# Patient Record
Sex: Male | Born: 1955 | Hispanic: No | Marital: Married | Smoking: Never smoker
Health system: Southern US, Community
[De-identification: ages and names within clinical notes are randomized; demographics above are authoritative.]

## PROBLEM LIST (undated history)

## (undated) DIAGNOSIS — G4730 Sleep apnea, unspecified: Secondary | ICD-10-CM

## (undated) DIAGNOSIS — Z950 Presence of cardiac pacemaker: Secondary | ICD-10-CM

## (undated) DIAGNOSIS — N184 Chronic kidney disease, stage 4 (severe): Secondary | ICD-10-CM

## (undated) DIAGNOSIS — M109 Gout, unspecified: Secondary | ICD-10-CM

## (undated) DIAGNOSIS — N179 Acute kidney failure, unspecified: Secondary | ICD-10-CM

## (undated) DIAGNOSIS — H409 Unspecified glaucoma: Secondary | ICD-10-CM

## (undated) DIAGNOSIS — I10 Essential (primary) hypertension: Secondary | ICD-10-CM

## (undated) DIAGNOSIS — E669 Obesity, unspecified: Secondary | ICD-10-CM

## (undated) DIAGNOSIS — G5793 Unspecified mononeuropathy of bilateral lower limbs: Secondary | ICD-10-CM

## (undated) DIAGNOSIS — N390 Urinary tract infection, site not specified: Secondary | ICD-10-CM

## (undated) DIAGNOSIS — I8390 Asymptomatic varicose veins of unspecified lower extremity: Secondary | ICD-10-CM

## (undated) DIAGNOSIS — I509 Heart failure, unspecified: Secondary | ICD-10-CM

## (undated) DIAGNOSIS — I350 Nonrheumatic aortic (valve) stenosis: Secondary | ICD-10-CM

## (undated) HISTORY — DX: Chronic kidney disease, stage 4 (severe): N18.4

## (undated) HISTORY — PX: KNEE SURGERY: SHX244

## (undated) HISTORY — PX: GASTRIC BYPASS: SHX52

## (undated) HISTORY — PX: OTHER SURGICAL HISTORY: SHX169

## (undated) HISTORY — DX: Presence of cardiac pacemaker: Z95.0

## (undated) HISTORY — PX: CHOLECYSTECTOMY, LAPAROSCOPIC: SHX56

## (undated) HISTORY — DX: Asymptomatic varicose veins of unspecified lower extremity: I83.90

## (undated) HISTORY — PX: EYE SURGERY: SHX253

## (undated) HISTORY — DX: Nonrheumatic aortic (valve) stenosis: I35.0

---

## 2008-03-12 ENCOUNTER — Emergency Department (HOSPITAL_COMMUNITY): Admission: EM | Admit: 2008-03-12 | Discharge: 2008-03-12 | Payer: Self-pay | Admitting: Emergency Medicine

## 2010-10-05 ENCOUNTER — Emergency Department (HOSPITAL_COMMUNITY)
Admission: EM | Admit: 2010-10-05 | Discharge: 2010-10-05 | Disposition: A | Discharge status: Home / Self Care | Payer: Medicaid - Out of State | Attending: Emergency Medicine | Admitting: Emergency Medicine

## 2010-10-05 DIAGNOSIS — Z862 Personal history of diseases of the blood and blood-forming organs and certain disorders involving the immune mechanism: Secondary | ICD-10-CM | POA: Insufficient documentation

## 2010-10-05 DIAGNOSIS — H938X9 Other specified disorders of ear, unspecified ear: Secondary | ICD-10-CM | POA: Insufficient documentation

## 2010-10-05 DIAGNOSIS — K219 Gastro-esophageal reflux disease without esophagitis: Secondary | ICD-10-CM | POA: Insufficient documentation

## 2010-10-05 DIAGNOSIS — I10 Essential (primary) hypertension: Secondary | ICD-10-CM | POA: Insufficient documentation

## 2010-10-05 DIAGNOSIS — I509 Heart failure, unspecified: Secondary | ICD-10-CM | POA: Insufficient documentation

## 2010-10-05 DIAGNOSIS — E119 Type 2 diabetes mellitus without complications: Secondary | ICD-10-CM | POA: Insufficient documentation

## 2010-10-05 DIAGNOSIS — R599 Enlarged lymph nodes, unspecified: Secondary | ICD-10-CM | POA: Insufficient documentation

## 2010-10-05 DIAGNOSIS — Z8639 Personal history of other endocrine, nutritional and metabolic disease: Secondary | ICD-10-CM | POA: Insufficient documentation

## 2010-10-05 DIAGNOSIS — Z794 Long term (current) use of insulin: Secondary | ICD-10-CM | POA: Insufficient documentation

## 2010-10-05 DIAGNOSIS — H60399 Other infective otitis externa, unspecified ear: Secondary | ICD-10-CM | POA: Insufficient documentation

## 2010-10-05 DIAGNOSIS — Z79899 Other long term (current) drug therapy: Secondary | ICD-10-CM | POA: Insufficient documentation

## 2010-10-05 DIAGNOSIS — Z7982 Long term (current) use of aspirin: Secondary | ICD-10-CM | POA: Insufficient documentation

## 2010-10-05 DIAGNOSIS — H9209 Otalgia, unspecified ear: Secondary | ICD-10-CM | POA: Insufficient documentation

## 2011-03-17 ENCOUNTER — Encounter: Payer: Self-pay | Admitting: Family Medicine

## 2011-03-17 DIAGNOSIS — H409 Unspecified glaucoma: Secondary | ICD-10-CM | POA: Insufficient documentation

## 2011-03-17 DIAGNOSIS — G629 Polyneuropathy, unspecified: Secondary | ICD-10-CM

## 2011-03-17 DIAGNOSIS — I10 Essential (primary) hypertension: Secondary | ICD-10-CM

## 2011-03-17 DIAGNOSIS — IMO0001 Reserved for inherently not codable concepts without codable children: Secondary | ICD-10-CM | POA: Insufficient documentation

## 2011-03-17 DIAGNOSIS — N189 Chronic kidney disease, unspecified: Secondary | ICD-10-CM

## 2011-03-17 DIAGNOSIS — E785 Hyperlipidemia, unspecified: Secondary | ICD-10-CM

## 2011-03-17 DIAGNOSIS — R609 Edema, unspecified: Secondary | ICD-10-CM | POA: Insufficient documentation

## 2011-03-17 DIAGNOSIS — M109 Gout, unspecified: Secondary | ICD-10-CM | POA: Insufficient documentation

## 2011-03-17 DIAGNOSIS — R600 Localized edema: Secondary | ICD-10-CM

## 2011-03-17 DIAGNOSIS — G4733 Obstructive sleep apnea (adult) (pediatric): Secondary | ICD-10-CM

## 2011-03-17 DIAGNOSIS — I5032 Chronic diastolic (congestive) heart failure: Secondary | ICD-10-CM

## 2011-04-10 ENCOUNTER — Other Ambulatory Visit: Payer: Self-pay | Admitting: Cardiology

## 2011-04-10 DIAGNOSIS — R0989 Other specified symptoms and signs involving the circulatory and respiratory systems: Secondary | ICD-10-CM

## 2011-04-14 ENCOUNTER — Encounter (INDEPENDENT_AMBULATORY_CARE_PROVIDER_SITE_OTHER): Payer: Medicaid Other | Admitting: *Deleted

## 2011-04-14 ENCOUNTER — Encounter: Payer: Medicaid - Out of State | Admitting: Cardiology

## 2011-04-14 DIAGNOSIS — R0989 Other specified symptoms and signs involving the circulatory and respiratory systems: Secondary | ICD-10-CM

## 2011-06-08 ENCOUNTER — Encounter: Payer: Self-pay | Admitting: Emergency Medicine

## 2011-06-08 ENCOUNTER — Emergency Department (HOSPITAL_COMMUNITY)
Admission: EM | Admit: 2011-06-08 | Discharge: 2011-06-08 | Disposition: A | Discharge status: Home / Self Care | Payer: Medicaid Other | Attending: Emergency Medicine | Admitting: Emergency Medicine

## 2011-06-08 DIAGNOSIS — K5289 Other specified noninfective gastroenteritis and colitis: Secondary | ICD-10-CM | POA: Insufficient documentation

## 2011-06-08 DIAGNOSIS — R5381 Other malaise: Secondary | ICD-10-CM | POA: Insufficient documentation

## 2011-06-08 DIAGNOSIS — R197 Diarrhea, unspecified: Secondary | ICD-10-CM | POA: Insufficient documentation

## 2011-06-08 DIAGNOSIS — R42 Dizziness and giddiness: Secondary | ICD-10-CM | POA: Insufficient documentation

## 2011-06-08 DIAGNOSIS — G4730 Sleep apnea, unspecified: Secondary | ICD-10-CM | POA: Insufficient documentation

## 2011-06-08 DIAGNOSIS — E669 Obesity, unspecified: Secondary | ICD-10-CM | POA: Insufficient documentation

## 2011-06-08 DIAGNOSIS — I10 Essential (primary) hypertension: Secondary | ICD-10-CM | POA: Insufficient documentation

## 2011-06-08 DIAGNOSIS — Z794 Long term (current) use of insulin: Secondary | ICD-10-CM | POA: Insufficient documentation

## 2011-06-08 DIAGNOSIS — E119 Type 2 diabetes mellitus without complications: Secondary | ICD-10-CM | POA: Insufficient documentation

## 2011-06-08 DIAGNOSIS — R112 Nausea with vomiting, unspecified: Secondary | ICD-10-CM | POA: Insufficient documentation

## 2011-06-08 DIAGNOSIS — K529 Noninfective gastroenteritis and colitis, unspecified: Secondary | ICD-10-CM

## 2011-06-08 HISTORY — DX: Obesity, unspecified: E66.9

## 2011-06-08 HISTORY — DX: Essential (primary) hypertension: I10

## 2011-06-08 HISTORY — DX: Sleep apnea, unspecified: G47.30

## 2011-06-08 LAB — COMPREHENSIVE METABOLIC PANEL
CO2: 27 mEq/L (ref 19–32)
Calcium: 9.8 mg/dL (ref 8.4–10.5)
Chloride: 99 mEq/L (ref 96–112)
Creatinine, Ser: 2.03 mg/dL — ABNORMAL HIGH (ref 0.50–1.35)
Total Bilirubin: 0.3 mg/dL (ref 0.3–1.2)
Total Protein: 7.7 g/dL (ref 6.0–8.3)

## 2011-06-08 LAB — CBC
HCT: 32.8 % — ABNORMAL LOW (ref 39.0–52.0)
Hemoglobin: 10.9 g/dL — ABNORMAL LOW (ref 13.0–17.0)
MCHC: 33.2 g/dL (ref 30.0–36.0)
Platelets: 187 10*3/uL (ref 150–400)
RBC: 3.99 MIL/uL — ABNORMAL LOW (ref 4.22–5.81)
RDW: 14.7 % (ref 11.5–15.5)
WBC: 13.5 10*3/uL — ABNORMAL HIGH (ref 4.0–10.5)

## 2011-06-08 LAB — DIFFERENTIAL
Basophils Absolute: 0.1 10*3/uL (ref 0.0–0.1)
Basophils Relative: 0 % (ref 0–1)
Eosinophils Absolute: 0.3 10*3/uL (ref 0.0–0.7)
Eosinophils Relative: 2 % (ref 0–5)
Lymphocytes Relative: 16 % (ref 12–46)
Monocytes Relative: 5 % (ref 3–12)
Neutrophils Relative %: 76 % (ref 43–77)

## 2011-06-08 MED ORDER — LOPERAMIDE HCL 2 MG PO CAPS
4.0000 mg | ORAL_CAPSULE | Freq: Once | ORAL | Status: AC
  Administered 2011-06-08: 4 mg via ORAL
  Filled 2011-06-08: qty 2
  Filled 2011-06-08: qty 1

## 2011-06-08 MED ORDER — SODIUM CHLORIDE 0.9 % IV BOLUS (SEPSIS)
1000.0000 mL | Freq: Once | INTRAVENOUS | Status: AC
  Administered 2011-06-08: 1000 mL via INTRAVENOUS

## 2011-06-08 MED ORDER — ONDANSETRON HCL 4 MG/2ML IJ SOLN
4.0000 mg | Freq: Once | INTRAMUSCULAR | Status: AC
  Administered 2011-06-08: 4 mg via INTRAVENOUS
  Filled 2011-06-08: qty 2

## 2011-06-08 MED ORDER — ONDANSETRON HCL 4 MG PO TABS: 4.0000 mg | ORAL_TABLET | Freq: Four times a day (QID) | ORAL | Status: AC

## 2011-06-08 NOTE — ED Provider Notes (Signed)
Medical screening examination/treatment/procedure(s) were performed by non-physician practitioner and as supervising physician I was immediately available for consultation/collaboration. Devoria Albe, MD, FACEP   Ward Givens, MD 06/08/11 [phone]

## 2011-06-08 NOTE — ED Notes (Signed)
PT. REPORTS PERSISTENT DIARRHEA WITH VOMITTING AND DIZZINESS ONSET YESTERDAY , DENIES FEVER OR CHILLS , NO PAIN .

## 2011-06-08 NOTE — ED Notes (Signed)
Pt reports he feels better and is ready to go home. MD made aware.

## 2011-06-08 NOTE — ED Provider Notes (Signed)
History     CSN: [id number]  Arrival date & time 06/08/11  0335   First MD Initiated Contact with Patient 06/08/11 0400      Chief Complaint  Patient presents with  . Diarrhea    (Consider location/radiation/quality/duration/timing/severity/associated sxs/prior treatment) HPI Comments: Patient here with diarrhea since yesterday - states awoke in the morning with watery diarrhea - states vomiting stated yesterday afternoon - reports multiple episodes of NBNB vomiting and watery non-foul smelling diarrhea - reports daughter in home with similar symptoms.  Denies fever, chills, states has been feeling dizzy and fatigued with the symptoms - has a history of DM but states no history of DKA - denies abdominal pain or blood in the bowel movement.  No recent trips.  Patient is a 56 y.o. male presenting with diarrhea. The history is provided by the patient. No language interpreter was used.  Diarrhea The primary symptoms include fatigue, nausea, vomiting and diarrhea. Primary symptoms do not include fever, weight loss, abdominal pain, melena, hematemesis, jaundice, hematochezia, dysuria, myalgias, arthralgias or rash. The illness began yesterday. The onset was sudden. The problem has not changed since onset. The illness does not include chills, anorexia, dysphagia, odynophagia, bloating, constipation, tenesmus, back pain or itching.    Past Medical History  Diagnosis Date  . Diabetes mellitus   . Hypertension   . Obesity   . Sleep apnea     History reviewed. No pertinent past surgical history.  No family history on file.  History  Substance Use Topics  . Smoking status: Never Smoker   . Smokeless tobacco: Not on file  . Alcohol Use: No      Review of Systems  Constitutional: Positive for appetite change and fatigue. Negative for fever, chills and weight loss.  Gastrointestinal: Positive for nausea, vomiting and diarrhea. Negative for dysphagia, abdominal pain, constipation, blood  in stool, melena, hematochezia, bloating, anorexia, hematemesis and jaundice.  Genitourinary: Negative for dysuria and flank pain.  Musculoskeletal: Negative for myalgias, back pain and arthralgias.  Skin: Negative for itching and rash.  Neurological: Positive for dizziness.  All other systems reviewed and are negative.    Allergies  Penicillins  Home Medications   Current Outpatient Rx  Name Route Sig Dispense Refill  . ACETAMINOPHEN 500 MG PO TABS Oral Take 1,000 mg by mouth every 6 (six) hours as needed. For pain     . ALLOPURINOL 100 MG PO TABS Oral Take 100 mg by mouth 2 (two) times daily.      . ASPIRIN 81 MG PO TABS Oral Take 81 mg by mouth daily.      Marland Kitchen BRINZOLAMIDE 1 % OP SUSP Both Eyes Place 1 drop into both eyes 2 (two) times daily.      Marland Kitchen CARVEDILOL 12.5 MG PO TABS Oral Take 12.5 mg by mouth 2 (two) times daily with a meal.      . DORZOLAMIDE HCL-TIMOLOL MAL 22.3-6.8 MG/ML OP SOLN Both Eyes Place 1 drop into both eyes 2 (two) times daily.      Marland Kitchen EXENATIDE 5 MCG/0.02ML [place] SOLN Subcutaneous Inject 5-10 mcg into the skin 2 (two) times daily with a meal. Takes in the morning and 10 mcg in the evening     . FUROSEMIDE 80 MG PO TABS Oral Take 80 mg by mouth 2 (two) times daily.      Marland Kitchen GABAPENTIN 400 MG PO CAPS Oral Take 400 mg by mouth 3 (three) times daily.      . INSULIN  ASPART 100 UNIT/ML [place] SOLN Subcutaneous Inject 45 Units into the skin 3 (three) times daily before meals.      . INSULIN GLARGINE 100 UNIT/ML  SOLN Subcutaneous Inject 45-55 Units into the skin 2 (two) times daily. 45 units QAM    55 units QPM    . LATANOPROST 0.005 % OP SOLN Left Eye Place 1 drop into the left eye at bedtime.     Marland Kitchen LISINOPRIL 10 MG PO TABS Oral Take 10 mg by mouth daily.      Marland Kitchen METFORMIN HCL 1000 MG PO TABS Oral Take 500-1,000 mg by mouth 2 (two) times daily with a meal. Takes 500 mg in the morning and 1000 mg at night    . OMEPRAZOLE 40 MG PO CPDR Oral Take 40 mg by mouth daily.      Marland Kitchen  PREDNISOLONE ACETATE 1 % OP SUSP Both Eyes Place 1 drop into both eyes at bedtime.      Marland Kitchen ROSUVASTATIN CALCIUM 20 MG PO TABS Oral Take 20 mg by mouth at bedtime.      Marland Kitchen VITAMIN B-12 1000 MCG PO TABS Oral Take 1,000 mcg by mouth daily.        BP 130/56  Pulse 82  Temp(Src) 98 F (36.7 C) (Oral)  Resp 18  SpO2 98%  Physical Exam  Nursing note and vitals reviewed. Constitutional: He is oriented to person, place, and time. He appears well-developed and well-nourished. No distress.       Morbidly obese  HENT:  Head: Normocephalic and atraumatic.  Right Ear: External ear normal.  Left Ear: External ear normal.  Nose: Nose normal.  Mouth/Throat: Oropharynx is clear and moist. No oropharyngeal exudate.  Eyes: Conjunctivae are normal. Pupils are equal, round, and reactive to light. No scleral icterus.  Neck: Normal range of motion. Neck supple. No JVD present.  Cardiovascular: Normal rate, regular rhythm and normal heart sounds.  Exam reveals no gallop and no friction rub.   No murmur heard. Pulmonary/Chest: Effort normal and breath sounds normal. No respiratory distress. He has no wheezes. He exhibits no tenderness.  Abdominal: Soft. Normal appearance. He exhibits no distension. Bowel sounds are increased. There is no tenderness. There is no CVA tenderness.  Musculoskeletal: Normal range of motion. He exhibits edema. He exhibits no tenderness.       1+ pitting edema  Lymphadenopathy:    He has no cervical adenopathy.  Neurological: He is alert and oriented to person, place, and time. No cranial nerve deficit.  Skin: Skin is warm and dry. No rash noted. No erythema. No pallor.  Psychiatric: He has a normal mood and affect. His behavior is normal. Judgment and thought content normal.    ED Course  Procedures (including critical care time)  Labs Reviewed  CBC - Abnormal; Notable for the following:    WBC 13.5 (*)    RBC 3.99 (*)    Hemoglobin 10.9 (*)    HCT 32.8 (*)    All other  components within normal limits  DIFFERENTIAL - Abnormal; Notable for the following:    Neutro Abs 10.3 (*)    All other components within normal limits  COMPREHENSIVE METABOLIC PANEL - Abnormal; Notable for the following:    Glucose, Bld 184 (*)    BUN 47 (*)    Creatinine, Ser 2.03 (*)    Albumin 3.3 (*)    GFR calc non Af Amer 35 (*)    GFR calc Af Amer 41 (*)  All other components within normal limits  LIPASE, BLOOD   No results found.   Gastroenteritis Chronic renal insufficiency hyperglycemia    MDM  Patient reports marked improvement since administration of the liter of fluids - will discharge home on imodium and zofran - patient noted with BUN of 47 and Cr 2.03 - I did not find any old values for this but when I talked with the patient he states that his "diabetes doctor" told him that his kidneys were not functioning good so I think this is his baseline.        Izola Price [place], [place] 06/08/11 [phone]

## 2011-06-08 NOTE — ED Notes (Signed)
Pt ambulated with a steady gait; VSS; no signs of distress; respirations even and unlabored; skin warm and dry. No questions at this time.

## 2011-08-05 ENCOUNTER — Emergency Department (HOSPITAL_COMMUNITY): Payer: No Typology Code available for payment source

## 2011-08-05 ENCOUNTER — Emergency Department (HOSPITAL_COMMUNITY)
Admission: EM | Admit: 2011-08-05 | Discharge: 2011-08-05 | Disposition: A | Discharge status: Home / Self Care | Payer: No Typology Code available for payment source | Attending: Emergency Medicine | Admitting: Emergency Medicine

## 2011-08-05 ENCOUNTER — Encounter (HOSPITAL_COMMUNITY): Payer: Self-pay | Admitting: Emergency Medicine

## 2011-08-05 DIAGNOSIS — S39012A Strain of muscle, fascia and tendon of lower back, initial encounter: Secondary | ICD-10-CM

## 2011-08-05 DIAGNOSIS — E669 Obesity, unspecified: Secondary | ICD-10-CM | POA: Insufficient documentation

## 2011-08-05 DIAGNOSIS — E119 Type 2 diabetes mellitus without complications: Secondary | ICD-10-CM | POA: Insufficient documentation

## 2011-08-05 DIAGNOSIS — M25569 Pain in unspecified knee: Secondary | ICD-10-CM | POA: Insufficient documentation

## 2011-08-05 DIAGNOSIS — S8000XA Contusion of unspecified knee, initial encounter: Secondary | ICD-10-CM

## 2011-08-05 DIAGNOSIS — Y92410 Unspecified street and highway as the place of occurrence of the external cause: Secondary | ICD-10-CM | POA: Insufficient documentation

## 2011-08-05 DIAGNOSIS — Z794 Long term (current) use of insulin: Secondary | ICD-10-CM | POA: Insufficient documentation

## 2011-08-05 DIAGNOSIS — M545 Low back pain, unspecified: Secondary | ICD-10-CM | POA: Insufficient documentation

## 2011-08-05 DIAGNOSIS — Z79899 Other long term (current) drug therapy: Secondary | ICD-10-CM | POA: Insufficient documentation

## 2011-08-05 DIAGNOSIS — I10 Essential (primary) hypertension: Secondary | ICD-10-CM | POA: Insufficient documentation

## 2011-08-05 MED ORDER — DIAZEPAM 5 MG PO TABS: 5.0000 mg | ORAL_TABLET | Freq: Two times a day (BID) | ORAL | Status: AC

## 2011-08-05 MED ORDER — HYDROCODONE-ACETAMINOPHEN 5-325 MG PO TABS: 1.0000 | ORAL_TABLET | ORAL | Status: AC | PRN

## 2011-08-05 MED ORDER — ACETAMINOPHEN 500 MG PO TABS
1000.0000 mg | ORAL_TABLET | Freq: Once | ORAL | Status: DC
  Filled 2011-08-05: qty 2

## 2011-08-05 MED ORDER — DOCUSATE SODIUM 100 MG PO CAPS: 100.0000 mg | ORAL_CAPSULE | Freq: Two times a day (BID) | ORAL | Status: AC

## 2011-08-05 MED ORDER — ACETAMINOPHEN 325 MG PO TABS
325.0000 mg | ORAL_TABLET | Freq: Once | ORAL | Status: AC
  Administered 2011-08-05: 325 mg via ORAL
  Filled 2011-08-05: qty 2

## 2011-08-05 MED ORDER — ACETAMINOPHEN 325 MG PO TABS
650.0000 mg | ORAL_TABLET | Freq: Once | ORAL | Status: AC
  Administered 2011-08-05: 650 mg via ORAL
  Filled 2011-08-05: qty 3
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
## 2011-10-21 ENCOUNTER — Encounter (HOSPITAL_BASED_OUTPATIENT_CLINIC_OR_DEPARTMENT_OTHER): Payer: Medicaid Other | Attending: General Surgery

## 2011-10-21 DIAGNOSIS — E109 Type 1 diabetes mellitus without complications: Secondary | ICD-10-CM | POA: Insufficient documentation

## 2011-10-21 DIAGNOSIS — Z794 Long term (current) use of insulin: Secondary | ICD-10-CM | POA: Insufficient documentation

## 2011-10-21 DIAGNOSIS — I739 Peripheral vascular disease, unspecified: Secondary | ICD-10-CM | POA: Insufficient documentation

## 2011-10-21 DIAGNOSIS — I872 Venous insufficiency (chronic) (peripheral): Secondary | ICD-10-CM | POA: Insufficient documentation

## 2011-10-21 DIAGNOSIS — G4730 Sleep apnea, unspecified: Secondary | ICD-10-CM | POA: Insufficient documentation

## 2011-10-21 DIAGNOSIS — Z79899 Other long term (current) drug therapy: Secondary | ICD-10-CM | POA: Insufficient documentation

## 2011-10-21 DIAGNOSIS — L97809 Non-pressure chronic ulcer of other part of unspecified lower leg with unspecified severity: Secondary | ICD-10-CM | POA: Insufficient documentation

## 2011-10-21 DIAGNOSIS — I10 Essential (primary) hypertension: Secondary | ICD-10-CM | POA: Insufficient documentation
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
## 2011-10-24 ENCOUNTER — Other Ambulatory Visit: Payer: Self-pay | Admitting: Family Medicine

## 2011-10-30 ENCOUNTER — Encounter (INDEPENDENT_AMBULATORY_CARE_PROVIDER_SITE_OTHER): Payer: Medicaid Other | Admitting: *Deleted

## 2011-10-30 ENCOUNTER — Ambulatory Visit (INDEPENDENT_AMBULATORY_CARE_PROVIDER_SITE_OTHER): Payer: Medicaid Other | Admitting: *Deleted

## 2011-10-30 DIAGNOSIS — L97509 Non-pressure chronic ulcer of other part of unspecified foot with unspecified severity: Secondary | ICD-10-CM

## 2011-11-03 ENCOUNTER — Emergency Department (HOSPITAL_COMMUNITY)
Admission: EM | Admit: 2011-11-03 | Discharge: 2011-11-03 | Disposition: A | Discharge status: Home / Self Care | Payer: Medicaid Other | Attending: Emergency Medicine | Admitting: Emergency Medicine

## 2011-11-03 ENCOUNTER — Encounter (HOSPITAL_COMMUNITY): Payer: Self-pay | Admitting: Emergency Medicine

## 2011-11-03 ENCOUNTER — Emergency Department (HOSPITAL_COMMUNITY): Payer: Medicaid Other

## 2011-11-03 DIAGNOSIS — G4730 Sleep apnea, unspecified: Secondary | ICD-10-CM | POA: Insufficient documentation

## 2011-11-03 DIAGNOSIS — E119 Type 2 diabetes mellitus without complications: Secondary | ICD-10-CM | POA: Insufficient documentation

## 2011-11-03 DIAGNOSIS — Z79899 Other long term (current) drug therapy: Secondary | ICD-10-CM | POA: Insufficient documentation

## 2011-11-03 DIAGNOSIS — M109 Gout, unspecified: Secondary | ICD-10-CM | POA: Insufficient documentation

## 2011-11-03 DIAGNOSIS — Z794 Long term (current) use of insulin: Secondary | ICD-10-CM | POA: Insufficient documentation

## 2011-11-03 DIAGNOSIS — I10 Essential (primary) hypertension: Secondary | ICD-10-CM | POA: Insufficient documentation

## 2011-11-03 HISTORY — DX: Gout, unspecified: M10.9

## 2011-11-03 MED ORDER — OXYCODONE-ACETAMINOPHEN 5-325 MG PO TABS
2.0000 | ORAL_TABLET | Freq: Once | ORAL | Status: AC
  Administered 2011-11-03: 2 via ORAL
  Filled 2011-11-03: qty 2

## 2011-11-03 MED ORDER — CELECOXIB 100 MG PO CAPS: 100.0000 mg | ORAL_CAPSULE | Freq: Two times a day (BID) | ORAL | Status: AC

## 2011-11-03 MED ORDER — OXYCODONE-ACETAMINOPHEN 5-325 MG PO TABS: 1.0000 | ORAL_TABLET | ORAL | Status: AC | PRN

## 2011-11-03 MED ORDER — COLCHICINE 0.6 MG PO TABS: ORAL_TABLET | ORAL | Status: DC

## 2011-11-03 MED ORDER — PREDNISONE 20 MG PO TABS: 40.0000 mg | ORAL_TABLET | Freq: Every day | ORAL | Status: AC

## 2011-11-03 MED ORDER — COLCHICINE 0.6 MG PO TABS
1.2000 mg | ORAL_TABLET | Freq: Once | ORAL | Status: AC
  Administered 2011-11-03: 1.2 mg via ORAL
  Filled 2011-11-03: qty 2

## 2011-11-03 NOTE — Discharge Instructions (Signed)
Gout:  You have been diagnosed with having an attack of acute gouty arthritis - this occurs when a joint has a build up of crystals that cause pain and swelling of the joint.  This can be significantly painful and if you develop severe or worsening pain or swelling or fevers you should return to the ER immediately for a recheck.  Generally this can be expected to get worse over 24-48 hours and then gradually improve over the week.  The pain will be much worse with touching or moving the effected joint.  You should treat this with keeping your joint elevated, and use medicines as listed below that I have prescribed, ice packs - (wrapped in a towel before touching the skin).  The medications used to treat chronic gout and to prevent further attacks cannot be given in the acute phase and must be directed by your family doctor.  Call your doctor today for a recheck in one week.  If you don't have a physician see the list of available physicians at the end of these instructions.  Please read the instructions below in their entirety to help better explain your disease process.    Gout  Gout is an inflammatory condition (arthritis) caused by a buildup of uric acid crystals in the joints. Uric acid is a chemical that is normally present in the blood. Under some circumstances, uric acid can form into crystals in your joints. This causes joint redness, soreness, and swelling (inflammation). Repeat attacks are common. Over time, uric acid crystals can form into masses (tophi) near a joint, causing disfigurement. Gout is treatable and often preventable.  CAUSES  The disease begins with elevated levels of uric acid in the blood. Uric acid is produced by your body when it breaks down a naturally found substance called purines. This also happens when you eat certain foods such as meats and fish. Causes of an elevated uric acid level include:  Being passed down from parent to child (heredity).  Diseases that cause  increased uric acid production (obesity, psoriasis, some cancers).  Excessive alcohol use.  Diet, especially diets rich in meat and seafood.  Medicines, including certain cancer-fighting drugs (chemotherapy), diuretics, and aspirin.  Chronic kidney disease. The kidneys are no longer able to remove uric acid well.  Problems with metabolism.  Conditions strongly associated with gout include:  Obesity.  High blood pressure.  High cholesterol.  Diabetes.  Not everyone with elevated uric acid levels gets gout. It is not understood why some people get gout and others do not. Surgery, joint injury, and eating too much of certain foods are some of the factors that can lead to gout.  SYMPTOMS  An attack of gout comes on quickly. It causes intense pain with redness, swelling, and warmth in a joint.  Fever can occur.  Often, only one joint is involved. Certain joints are more commonly involved:  Base of the big toe.  Knee.  Ankle.  Wrist.  Finger.  Without treatment, an attack usually goes away in a few days to weeks. Between attacks, you usually will not have symptoms, which is different from many other forms of arthritis.  DIAGNOSIS  Your caregiver will suspect gout based on your symptoms and exam. Removal of fluid from the joint (arthrocentesis) is done to check for uric acid crystals. Your caregiver will give you a medicine that numbs the area (local anesthetic) and use a needle to remove joint fluid for exam. Gout is confirmed when uric acid crystals   are seen in joint fluid, using a special microscope. Sometimes, blood, urine, and X-ray tests are also used.  TREATMENT  There are 2 phases to gout treatment: treating the sudden onset (acute) attack and preventing attacks (prophylaxis).  Treatment of an Acute Attack  Medicines are used. These include anti-inflammatory medicines or steroid medicines.  An injection of steroid medicine into the affected joint is sometimes necessary.  The painful  joint is rested. Movement can worsen the arthritis.  You may use warm or cold treatments on painful joints, depending which works best for you.  Discuss the use of coffee, vitamin C, or cherries with your caregiver. These may be helpful treatment options.  Treatment to Prevent Attacks  After the acute attack subsides, your caregiver may advise prophylactic medicine. These medicines either help your kidneys eliminate uric acid from your body or decrease your uric acid production. You may need to stay on these medicines for a very long time.  The early phase of treatment with prophylactic medicine can be associated with an increase in acute gout attacks. For this reason, during the first few months of treatment, your caregiver may also advise you to take medicines usually used for acute gout treatment. Be sure you understand your caregiver's directions.  You should also discuss dietary treatment with your caregiver. Certain foods such as meats and fish can increase uric acid levels. Other foods such as dairy can decrease levels. Your caregiver can give you a list of foods to avoid.  HOME CARE INSTRUCTIONS  Do not take aspirin to relieve pain. This raises uric acid levels.  Only take over-the-counter or prescription medicines for pain, discomfort, or fever as directed by your caregiver.  Rest the joint as much as possible. When in bed, keep sheets and blankets off painful areas.  Keep the affected joint raised (elevated).  Use crutches if the painful joint is in your leg.  Drink enough water and fluids to keep your urine clear or pale yellow. This helps your body get rid of uric acid. Do not drink alcoholic beverages. They slow the passage of uric acid.  Follow your caregiver's dietary instructions. Pay careful attention to the amount of protein you eat. Your daily diet should emphasize fruits, vegetables, whole grains, and fat-free or low-fat milk products.  Maintain a healthy body weight.  SEEK  MEDICAL CARE IF:  You have an oral temperature above 101 F (38.0 C).  You develop diarrhea, vomiting, or any side effects from medicines.  You do not feel better in 24 hours, or you are getting worse.  SEEK IMMEDIATE MEDICAL CARE IF:  Your joint becomes suddenly more tender and you have:  Chills.  An oral temperature above 112 F (38.0 C)  MAKE SURE YOU:  Understand these instructions.  Will watch your condition.  Will get help right away if you are not doing well or get worse.   Document Released: 05/16/2000 Document Revised: 05/08/2011 Document Reviewed: 08/27/2009  ExitCare Patient Information 2012 [employer].   The medicines that are used to treat gout are generally antiinflammatory medications and as with ALL medications they can causae side effects and should not be used for prolonged periods of time.  You may have been prescribed the following:  1.  Colchicine - take one tablen (0.6mg) by mouth every 1-2 hours until one of the following occurs:   A) The pain goes away  B) You have reached the maximum dose which is 3 tablets in 3 hours or 10 tablets   in 24 hours  C) You have side effects such as diarrhea, severe gas or nausea that prevent you from taking the medications   Colchicine should not be used for longer than 24 hours and if taken should not be used for 1 week after it was taken.  If you are a diabetic or have kidney problems you should exercise caution in taking this medications - consult with your pharmacist regarding use of these medications especially if you are taking other medications for  Acute or chronic conditions.  2.  Prednisone - for severe or recurrent gout this may help reduce the swelling and inflammation  3.  Celebrex - this is a type of NSAID similar to motrin / ibuprofen or aleve/naprosyn and should not be taken with these other medications that are NSAIDS as well.  4.  Percocet - This is an opiate medications and should be used with  caution.  Acetaminophen; Oxycodone tablets or PERCOCET  This is a pain reliever. It is used to treat mild to moderate pain.  This medicine may be used for other purposes; ask your health care provider or pharmacist if you have questions.  What should I tell my health care provider before I take this medicine?  They need to know if you have any of these conditions:  -brain tumor  -Crohn's disease, inflammatory bowel disease, or ulcerative colitis  -drink more than 3 alcohol containing drinks per day  -drug abuse or addiction  -head injury  -heart or circulation problems  -kidney disease or problems going to the bathroom  -liver disease  -lung disease, asthma, or breathing problems  -an unusual or allergic reaction to acetaminophen, oxycodone, other opioid analgesics, other medicines, foods, dyes, or preservatives  -pregnant or trying to get pregnant  -breast-feeding  How should I use this medicine?  Take this medicine by mouth with a full glass of water. Follow the directions on the prescription label. Take your medicine at regular intervals. Do not take your medicine more often than directed.  Talk to your pediatrician regarding the use of this medicine in children. Special care may be needed.  Patients over 65 years old may have a stronger reaction and need a smaller dose.  Overdosage: If you think you have taken too much of this medicine contact a poison control center or emergency room at once.  NOTE: This medicine is only for you. Do not share this medicine with others.   What may interact with this medicine?  -alcohol or medicines that contain alcohol  -antihistamines  -barbiturates like amobarbital, butalbital, butabarbital, methohexital, pentobarbital, phenobarbital, thiopental, and secobarbital  -benztropine  -drugs for bladder problems like solifenacin, trospium, oxybutynin, tolterodine, hyoscyamine, and methscopolamine  -drugs for breathing problems like ipratropium and  tiotropium  -drugs for certain stomach or intestine problems like propantheline, homatropine methylbromide, glycopyrrolate, atropine, belladonna, and dicyclomine  -general anesthetics like etomidate, ketamine, nitrous oxide, propofol, desflurane, enflurane, halothane, isoflurane, and sevoflurane  -medicines for depression, anxiety, or psychotic disturbances  -medicines for pain like codeine, morphine, pentazocine, buprenorphine, butorphanol, nalbuphine, tramadol, and propoxyphene  -medicines for sleep  -muscle relaxants  -naltrexone  -phenothiazines like perphenazine, thioridazine, chlorpromazine, mesoridazine, fluphenazine, prochlorperazine, promazine, and trifluoperazine  -scopolamine  -trihexyphenidyl  This list may not describe all possible interactions. Give your health care provider a list of all the medicines, herbs, non-prescription drugs, or dietary supplements you use. Also tell them if you smoke, drink alcohol, or use illegal drugs. Some items may interact with your medicine.  What   should I watch for while using this medicine?  Tell your doctor or health care professional if your pain does not go away, if it gets worse, or if you have new or a different type of pain Do not suddenly stop taking your medicine because you may develop a severe reaction. Your body becomes used to the medicine. This does NOT mean you are addicted. Addiction is a behavior related to getting and using a drug for a nonmedical reason.  You may get drowsy or dizzy. Do not drive, use machinery, or do anything that needs mental alertness until you know how this medicine affects you. Do not stand or sit up quickly, especially if you are an older patient. This reduces the risk of dizzy or fainting spells. Alcohol may interfere with the effect of this medicine. Avoid alcoholic drinks.  The medicine will cause constipation. Try to have a bowel movement at least every 2 to 3 days. If you do not have a bowel movement for 3  days, call your doctor or health care professional.  Do not take Tylenol (acetaminophen) or medicines that have acetaminophen with this medicine. Too much acetaminophen can be very dangerous. Many nonprescription medicines contain acetaminophen. Always read the labels carefully to avoid taking more acetaminophen.   What side effects may I notice from receiving this medicine?  Side effects that you should report to your doctor or health care professional as soon as possible:  -allergic reactions like skin rash, itching or hives, swelling of the face, lips, or tongue  -breathing difficulties, wheezing  -confusion  -light headedness or fainting spells  -severe stomach pain  -yellowing of the skin or the whites of the eyes  Side effects that usually do not require medical attention (report to your doctor or health care professional if they continue or are bothersome):  -dizziness  -drowsiness  -nausea  -vomiting  This list may not describe all possible side effects. Call your doctor for medical advice about side effects. You may report side effects to FDA at 1-800-FDA-1088.  Where should I keep my medicine?  Keep out of the reach of children. This medicine can be abused. Keep your medicine in a safe place to protect it from theft. Do not share this medicine with anyone. Selling or giving away this medicine is dangerous and against the law.   NOTE: This sheet is a summary. It may not cover all possible information. If you have questions about this medicine, talk to your doctor, pharmacist, or health care provider.      RESOURCE GUIDE  Dental Problems  Patients with Medicaid: [facility]                     [facility] [address]                                           [address] Phone:  [phone]                                                  Phone:  [phone]  If unable to pay or uninsured, contact:  [facility] or [facility]  to become qualified for the adult dental   clinic.  Chronic Pain Problems Contact [facility]  [phone] Patients need to be referred by their primary care doctor.  Insufficient Money for Medicine Contact [employer]:  call "211" or [facility] 271-5999.  No Primary Care Doctor Call [facility]  832-8000 Other agencies that provide inexpensive medical care    [facility]  [phone]     Internal Medicine  [phone]    [facility]  [phone]    Women's Clinic  [phone]    [facility]  [phone]    [facility]  [phone]  Psychological Services [facility]  [phone] [facility]  [phone] [facility]   800 853-5163 (emergency services 641-4993)  Substance Abuse Resources [facility]  336-882-2125 [facility] 336-784-9470 [facility] 336-285-9073 [facility] 336-845-3988 Residential & Outpatient Substance Abuse Program  800-659-3381  Abuse/Neglect [place] Child Abuse Hotline (336) 641-3795 [place] Child Abuse Hotline 800-378-5315 (After Hours)  Emergency Shelter [facility] (336) 271-5985  [facility] (336) 275-9566 [facility] (704) 372-4663  MRSA Hotline #:   832-7006    [place] [facility]  [facility] [place]     [employer]                          [facility] [address] [place]                       [address]      [address]                                                  [place]                            [place] Phone:  [phone]                                   Phone:  [phone]                 Phone:  [phone]  [facility] Phone:  [phone]  [place] Child Abuse Hotline (336) 342-1394 (336) 342-3537 (After Hours)    

## 2011-11-03 NOTE — ED Notes (Signed)
Patient complaining of right leg pain that started on Friday.  Pain starts in foot, goes up to calf.  Muscles are tight going up calf.

## 2011-11-03 NOTE — ED Notes (Signed)
Pt is alertx4, NAD at this moment.

## 2011-11-03 NOTE — ED Provider Notes (Signed)
History     CSN: [id number]  Arrival date & time 11/03/11  0402   First MD Initiated Contact with Patient 11/03/11 0408      Chief Complaint  Patient presents with  . Leg Pain    (Consider location/radiation/quality/duration/timing/severity/associated sxs/prior treatment) HPI Comments: 56 year old male with a history of diabetes, hypertension, obesity and sleep apnea who presents with pain in his right foot. He states that he has had swelling in his bilateral lower extremities for some time, on Thursday of last week he states that he had an ultrasound Doppler study of his bilateral lower extremities which showed no signs of blood clots. He was prescribed compression stockings which he has been wearing for the last 3 days but states that approximately 24 hours after starting to wear them he started to develop significant pain on the dorsum of his distal right foot. This pain is persistent, worse with palpation and ambulation and not associated with redness swelling or fevers. He states that the compression stockings that he bought were too small and he suspects that this is part of the problem  Patient is a 56 y.o. male presenting with leg pain. The history is provided by the patient and a relative.  Leg Pain     Past Medical History  Diagnosis Date  . Diabetes mellitus   . Hypertension   . Obesity   . Sleep apnea   . Gouty arthritis     History reviewed. No pertinent past surgical history.  No family history on file.  History  Substance Use Topics  . Smoking status: Never Smoker   . Smokeless tobacco: Not on file  . Alcohol Use: No      Review of Systems  Constitutional: Negative for fever and chills.  Musculoskeletal: Positive for gait problem. Negative for joint swelling.  Skin: Negative for rash.    Allergies  Penicillins  Home Medications   Current Outpatient Rx  Name Route Sig Dispense Refill  . ALLOPURINOL 100 MG PO TABS Oral Take 100 mg by mouth 2 (two)  times daily.      . ASPIRIN 81 MG PO TABS Oral Take 81 mg by mouth daily.      Marland Kitchen BRINZOLAMIDE 1 % OP SUSP Both Eyes Place 1 drop into both eyes 2 (two) times daily.      Marland Kitchen CARVEDILOL 12.5 MG PO TABS Oral Take 12.5 mg by mouth 2 (two) times daily with a meal.      . DORZOLAMIDE HCL-TIMOLOL MAL 22.3-6.8 MG/ML OP SOLN Both Eyes Place 1 drop into both eyes 2 (two) times daily.      . FUROSEMIDE 80 MG PO TABS Oral Take 40-80 mg by mouth See admin instructions. Patient alternates doses. One day he will take 80 mg in the morning and 40 mg in the evening. The next day he will take 40 mg for both doses.    Marland Kitchen GABAPENTIN 300 MG PO CAPS Oral Take 300 mg by mouth 3 (three) times daily.    . INSULIN ASPART 100 UNIT/ML [place] SOLN Subcutaneous Inject 45 Units into the skin 3 (three) times daily before meals.      . INSULIN GLARGINE 100 UNIT/ML [place] SOLN Subcutaneous Inject 45-55 Units into the skin 2 (two) times daily. 45 units QAM    55 units QPM    . LATANOPROST 0.005 % OP SOLN Left Eye Place 1 drop into the left eye at bedtime.     Marland Kitchen LIRAGLUTIDE 18 MG/3ML [place] SOLN Subcutaneous  Inject 1.8 mg into the skin every morning.     Marland Kitchen LISINOPRIL 10 MG PO TABS Oral Take 10 mg by mouth daily.      Marland Kitchen NIACIN 500 MG PO TABS Oral Take 500 mg by mouth at bedtime.     . OMEPRAZOLE 40 MG PO CPDR Oral Take 40 mg by mouth daily.      Marland Kitchen PREDNISOLONE ACETATE 1 % OP SUSP Both Eyes Place 1 drop into both eyes at bedtime.      Marland Kitchen ROSUVASTATIN CALCIUM 20 MG PO TABS Oral Take 20 mg by mouth at bedtime.      . TRAMADOL HCL 50 MG PO TABS Oral Take 50 mg by mouth every 6 (six) hours as needed. For pain    . VITAMIN B-12 1000 MCG PO TABS Oral Take 1,000 mcg by mouth daily.      . CELECOXIB 100 MG PO CAPS Oral Take 1 capsule (100 mg total) by mouth 2 (two) times daily. 20 capsule 0  . COLCHICINE 0.6 MG PO TABS  Take 0.6mg  (one tablet) by mouth every 1-2 hours until one of the following occurs: 1.  The pain is gone 2.  The maximum dose has been  given ( no more than 3 tabs in 3 hours or 10 tabs in 24 hours) 3.  The side effects outweight the benefits 10 tablet 0  . OXYCODONE-ACETAMINOPHEN 5-325 MG PO TABS Oral Take 1 tablet by mouth every 4 (four) hours as needed for pain. 20 tablet 0  . PREDNISONE 20 MG PO TABS Oral Take 2 tablets (40 mg total) by mouth daily. Take 40 mg by mouth daily for 3 days, then 20mg  by mouth daily for 3 days, then 10mg  daily for 3 days 12 tablet 0    BP 162/87  Pulse 88  Temp(Src) 98.7 F (37.1 C) (Oral)  Resp 18  SpO2 96%  Physical Exam  Nursing note and vitals reviewed. Constitutional: He appears well-developed and well-nourished. No distress.  HENT:  Head: Normocephalic and atraumatic.  Eyes: Conjunctivae are normal. Right eye exhibits no discharge. Left eye exhibits no discharge. No scleral icterus.  Pulmonary/Chest: Effort normal.  Abdominal:       Morbidly obese  Musculoskeletal: Normal range of motion. He exhibits edema ( Bilateral 1+ pitting edema of the lower extremities) and tenderness ( Tender to palpation over the right distal dorsal foot over the medial surface first MTP and second MTP.).  Neurological: He is alert.       Clear speech, moves all extremities x4, antalgic gait secondary to foot pain  Skin: Skin is warm and dry.       Hyperpigmentation of the bilateral lower extremities below the knee, no open wounds  Psychiatric: He has a normal mood and affect. His behavior is normal.    ED Course  Procedures (including critical care time)  Labs Reviewed - No data to display Dg Foot Complete Right  11/03/2011  *RADIOLOGY REPORT*  Clinical Data: Right foot pain.  RIGHT FOOT COMPLETE - 3+ VIEW  Comparison: None  Findings: There are moderate degenerative changes involving the first metatarsal phalangeal joint and midfoot.  Extensive vascular calcifications are noted.  No acute fracture.  No destructive bony changes.  Extensive soft tissue calcifications noted in the ankle and distal tibial  regions.  Findings could be due to dermatomyositis or previous trauma or infection.  IMPRESSION: Degenerative changes but no acute bony findings.  Original Report Authenticated By: P. Loralie Champagne, M.D.  1. Acute gouty arthritis       MDM  Dorsal distal foot pain, patient does have a history of gout, this could be consistent with his gout. He is currently taking allopurinol as a preventative measure. X-ray pending to rule out other source of pain such as tumor or occult fracture., Pain medication given   Discussed findings of xray with pt - I have personally reviewed the xrays and find no source of pain other than arthritis - given no fever or tachycardia significant swelling or redness or warmth of the foot I suspect this is likely just a doubt flare. Patient has been given culture seen in Percocet in the emergency department, discharged with several pain medications including the following  Discharge Prescriptions include:  #1 colchicine  #2 prednisone  #3 Percocet  #4 Celebrex     Vida Roller, MD 11/03/11 [phone]

## 2011-11-17 NOTE — Procedures (Unsigned)
DUPLEX DEEP VENOUS EXAM - LOWER EXTREMITY  INDICATION:  Right calf ulcer.  HISTORY:  Edema:  Yes. Trauma/Surgery:  No. Pain:  No. PE:  No. Previous DVT:  No. Anticoagulants:  No. Other:  DUPLEX EXAM:               CFV   SFV Prox    PopV   PTV   GSV               R  L  R  L  R  L  R   L  R  L Thrombosis    o  o  o  o  o  o  o   o  o  o Spontaneous   +  +  +  +  +  +         +  + Phasic        +  +  +  +  +  +         +  + Augmentation  +  +  +  +  +  +  +   +  +  + Compressible  +  +  +  +  +  +  +   +  +  + Competent     +  +  +  +  +  +  +   +  o  o  Legend:  + - yes  o - no  p - partial  D - decreased   IMPRESSION: 1. Deep vein evaluation could not be completed in its entirety due to     body habitus.  No evidence of deep venous thrombosis or reflux     observed where visualized. 2. 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         _____________________________ V. Charlena Cross, MD  LT/MEDQ  D:  10/30/2011  T:  10/30/2011  Job:  [id number]

## 2011-11-18 ENCOUNTER — Encounter (HOSPITAL_BASED_OUTPATIENT_CLINIC_OR_DEPARTMENT_OTHER): Payer: Medicaid Other

## 2011-12-01 ENCOUNTER — Other Ambulatory Visit: Payer: Self-pay | Admitting: Otolaryngology

## 2011-12-01 ENCOUNTER — Other Ambulatory Visit: Payer: Self-pay | Admitting: Diagnostic Radiology

## 2011-12-01 DIAGNOSIS — H9319 Tinnitus, unspecified ear: Secondary | ICD-10-CM

## 2011-12-11 ENCOUNTER — Inpatient Hospital Stay: Admission: RE | Admit: 2011-12-11 | Payer: No Typology Code available for payment source | Source: Ambulatory Visit

## 2011-12-19 ENCOUNTER — Inpatient Hospital Stay: Admission: RE | Admit: 2011-12-19 | Payer: Medicaid Other | Source: Ambulatory Visit

## 2011-12-25 ENCOUNTER — Ambulatory Visit
Admission: RE | Admit: 2011-12-25 | Discharge: 2011-12-25 | Disposition: A | Discharge status: Home / Self Care | Payer: Medicaid Other | Source: Ambulatory Visit | Attending: Otolaryngology | Admitting: Otolaryngology

## 2011-12-25 DIAGNOSIS — H9319 Tinnitus, unspecified ear: Secondary | ICD-10-CM

## 2012-02-06 ENCOUNTER — Encounter (HOSPITAL_BASED_OUTPATIENT_CLINIC_OR_DEPARTMENT_OTHER): Payer: Medicaid Other | Attending: General Surgery

## 2012-02-06 DIAGNOSIS — I872 Venous insufficiency (chronic) (peripheral): Secondary | ICD-10-CM | POA: Insufficient documentation

## 2012-02-06 DIAGNOSIS — E119 Type 2 diabetes mellitus without complications: Secondary | ICD-10-CM | POA: Insufficient documentation

## 2012-02-06 DIAGNOSIS — L97809 Non-pressure chronic ulcer of other part of unspecified lower leg with unspecified severity: Secondary | ICD-10-CM | POA: Insufficient documentation

## 2012-02-06 DIAGNOSIS — Z79899 Other long term (current) drug therapy: Secondary | ICD-10-CM | POA: Insufficient documentation

## 2012-02-06 DIAGNOSIS — Z7982 Long term (current) use of aspirin: Secondary | ICD-10-CM | POA: Insufficient documentation

## 2012-02-10 NOTE — Progress Notes (Signed)
[facility]  NAME:  Alexander Hubbard, Alexander Hubbard NO.:  [id number]  MEDICAL RECORD NO.:  [id number]      DATE OF BIRTH:  1955/10/04  PHYSICIAN:  Ardath Sax, M.D.           VISIT DATE:                                  OFFICE VISIT   This is a 56 year old gentleman who returns with ulcers on his legs.  He has had problems with these in the past and has been diagnosed with venous stasis ulcers.  He has been treated with alginate and Profore Lite and even Oasis in the past.  He is a diabetic.  He is morbidly obese.  He is alert and healthy otherwise.  He is afebrile.  His pulse is 80, respirations 16.  He is normotensive.  His medicines include allopurinol, aspirin, Coreg, Crestor, Lasix, gabapentin, omeprazole, NovoLog insulin, lisinopril and Niaspan.  He has small, what look like, venous stasis ulcers both of his legs and we are going to treat him with alginate and Profore Lite compression.  We will see him back here in a week.     Ardath Sax, M.D.     PP/MEDQ  D:  02/10/2012  T:  02/10/2012  Job:  [id number]

## 2012-02-25 NOTE — Progress Notes (Signed)
[facility]  NAME:  [name], [name] NO.:  MEDICAL RECORD NO.:  [id number]      DATE OF BIRTH:  11-01-1955  PHYSICIAN:  Joanne Gavel, M.D.              VISIT DATE:                                  OFFICE VISIT   ADMISSION DIAGNOSIS:  Venous ulcers.  DISCHARGE DIAGNOSIS:  Venous ulcers.  CLINIC COURSE:  The patient was treated with Profore Lite and elevation. At the time of discharge, the wounds are healed.  His discharge condition is good.  Recommendations are for elevation whenever possible and support stockings and we will see p.r.n.     Joanne Gavel, M.D.     RA/MEDQ  D:  02/24/2012  T:  02/24/2012  Job:  [id number]

## 2012-03-05 ENCOUNTER — Encounter (HOSPITAL_BASED_OUTPATIENT_CLINIC_OR_DEPARTMENT_OTHER): Payer: Medicaid Other

## 2012-07-07 ENCOUNTER — Encounter (HOSPITAL_BASED_OUTPATIENT_CLINIC_OR_DEPARTMENT_OTHER): Payer: Medicaid Other | Attending: General Surgery

## 2012-07-07 DIAGNOSIS — J4489 Other specified chronic obstructive pulmonary disease: Secondary | ICD-10-CM | POA: Insufficient documentation

## 2012-07-07 DIAGNOSIS — I872 Venous insufficiency (chronic) (peripheral): Secondary | ICD-10-CM | POA: Insufficient documentation

## 2012-07-07 DIAGNOSIS — I10 Essential (primary) hypertension: Secondary | ICD-10-CM | POA: Insufficient documentation

## 2012-07-07 DIAGNOSIS — Z794 Long term (current) use of insulin: Secondary | ICD-10-CM | POA: Insufficient documentation

## 2012-07-07 DIAGNOSIS — G4730 Sleep apnea, unspecified: Secondary | ICD-10-CM | POA: Insufficient documentation

## 2012-07-07 DIAGNOSIS — Z79899 Other long term (current) drug therapy: Secondary | ICD-10-CM | POA: Insufficient documentation

## 2012-07-07 DIAGNOSIS — J449 Chronic obstructive pulmonary disease, unspecified: Secondary | ICD-10-CM | POA: Insufficient documentation

## 2012-07-07 DIAGNOSIS — L97809 Non-pressure chronic ulcer of other part of unspecified lower leg with unspecified severity: Secondary | ICD-10-CM | POA: Insufficient documentation

## 2012-07-07 DIAGNOSIS — E119 Type 2 diabetes mellitus without complications: Secondary | ICD-10-CM | POA: Insufficient documentation

## 2012-07-07 NOTE — Progress Notes (Signed)
[facility]  NAME:  Alexander Hubbard, Alexander Hubbard NO.:  [id number]  MEDICAL RECORD NO.:  [id number]      DATE OF BIRTH:  02/10/1956  PHYSICIAN:  Ardath Sax, M.D.           VISIT DATE:                                  OFFICE VISIT   This is a 57 year old morbidly obese gentleman who comes to [place] because of an ulcer on the anterior aspect of his left leg.  This man has bilateral venous hypertension and I am classifying this as a chronic venous ulcer, which probably started by trauma, although he does not remember the trauma, but it looks like an abrasion on a leg that is involved with pretty severe changes of chronic venous stasis.  He weighs 386 pounds.  He has great problems with COPD, hypertension, diabetes.  He is on many medicines including aspirin, Coreg, Crestor, Lasix, Neurontin, lisinopril, and Lantus insulin.  He also takes Victoza 18 units a day.  He today had a blood pressure of 150/80, pulse of 80, respirations 20, temperature 98.6.  He will be treated with an Radio broadcast assistant, and we will put some silver alginate on the wound, which is about 2 cm in diameter, and is fairly superficial.  Interestingly enough, this gentleman is scheduled at [facility] for a laparoscopic gastric banding for his morbid obesity.  So, his diagnosis is venous hypertension, chronic venous ulcer left leg, probably precipitated by trauma.  Other diagnoses are morbid obesity, diabetes, hypertension, sleep apnea.     Ardath Sax, M.D.     PP/MEDQ  D:  07/07/2012  T:  07/07/2012  Job:  [id number]

## 2012-07-19 ENCOUNTER — Other Ambulatory Visit: Payer: Self-pay | Admitting: Family Medicine

## 2012-07-19 ENCOUNTER — Ambulatory Visit
Admission: RE | Admit: 2012-07-19 | Discharge: 2012-07-19 | Disposition: A | Discharge status: Home / Self Care | Payer: Medicaid Other | Source: Ambulatory Visit | Attending: Family Medicine | Admitting: Family Medicine

## 2012-07-19 DIAGNOSIS — M545 Low back pain: Secondary | ICD-10-CM

## 2012-08-02 ENCOUNTER — Encounter (HOSPITAL_BASED_OUTPATIENT_CLINIC_OR_DEPARTMENT_OTHER): Payer: Medicaid Other | Attending: General Surgery

## 2012-08-02 DIAGNOSIS — I87309 Chronic venous hypertension (idiopathic) without complications of unspecified lower extremity: Secondary | ICD-10-CM | POA: Insufficient documentation

## 2012-08-02 DIAGNOSIS — I872 Venous insufficiency (chronic) (peripheral): Secondary | ICD-10-CM | POA: Insufficient documentation

## 2012-08-02 DIAGNOSIS — L97809 Non-pressure chronic ulcer of other part of unspecified lower leg with unspecified severity: Secondary | ICD-10-CM | POA: Insufficient documentation

## 2012-08-18 ENCOUNTER — Ambulatory Visit: Payer: Medicaid Other | Attending: Family Medicine | Admitting: Physical Therapy

## 2012-08-18 DIAGNOSIS — R293 Abnormal posture: Secondary | ICD-10-CM | POA: Insufficient documentation

## 2012-08-18 DIAGNOSIS — M6281 Muscle weakness (generalized): Secondary | ICD-10-CM | POA: Insufficient documentation

## 2012-08-18 DIAGNOSIS — IMO0001 Reserved for inherently not codable concepts without codable children: Secondary | ICD-10-CM | POA: Insufficient documentation

## 2012-08-18 DIAGNOSIS — M545 Low back pain, unspecified: Secondary | ICD-10-CM | POA: Insufficient documentation

## 2012-08-20 ENCOUNTER — Telehealth: Payer: Self-pay | Admitting: Vascular Surgery

## 2012-08-20 NOTE — Telephone Encounter (Signed)
This patient was referred to [place] by Dr.Egerton (via faxed referral) for a venous doppler to rule out a possible dvt. The pt has pain/swelling in r leg per order faxed from [facility] @ Dr.Egerton's office. I called the patient to schedule an appointment for the venous doppler and he declined an appointment for Monday 08/23/12 and stated he would be out of town until 08/16/12. I scheduled an appt for him on 08/26/12 at 11am. I notified the referring office of this appt.awt

## 2012-08-25 ENCOUNTER — Other Ambulatory Visit: Payer: Self-pay

## 2012-08-25 ENCOUNTER — Ambulatory Visit: Payer: Medicaid Other | Admitting: Physical Therapy

## 2012-08-25 DIAGNOSIS — M7989 Other specified soft tissue disorders: Secondary | ICD-10-CM

## 2012-08-27 ENCOUNTER — Ambulatory Visit (INDEPENDENT_AMBULATORY_CARE_PROVIDER_SITE_OTHER): Payer: Medicaid Other | Admitting: Vascular Surgery

## 2012-08-27 DIAGNOSIS — M7989 Other specified soft tissue disorders: Secondary | ICD-10-CM

## 2012-08-27 DIAGNOSIS — M79609 Pain in unspecified limb: Secondary | ICD-10-CM

## 2012-08-27 NOTE — Progress Notes (Signed)
Preliminary results phoned to delydia @ [facility] @ 15:52

## 2012-08-31 ENCOUNTER — Encounter (HOSPITAL_BASED_OUTPATIENT_CLINIC_OR_DEPARTMENT_OTHER): Payer: Medicaid Other | Attending: General Surgery

## 2012-08-31 ENCOUNTER — Telehealth: Payer: Self-pay | Admitting: Family Medicine

## 2012-08-31 DIAGNOSIS — L97809 Non-pressure chronic ulcer of other part of unspecified lower leg with unspecified severity: Secondary | ICD-10-CM | POA: Insufficient documentation

## 2012-08-31 DIAGNOSIS — I872 Venous insufficiency (chronic) (peripheral): Secondary | ICD-10-CM | POA: Insufficient documentation

## 2012-08-31 MED ORDER — FUROSEMIDE 40 MG PO TABS: 40.0000 mg | ORAL_TABLET | Freq: Two times a day (BID) | ORAL | Status: DC

## 2012-08-31 MED ORDER — GABAPENTIN 300 MG PO CAPS: 300.0000 mg | ORAL_CAPSULE | Freq: Three times a day (TID) | ORAL | Status: DC

## 2012-08-31 NOTE — Telephone Encounter (Signed)
rx refilled.

## 2012-09-01 ENCOUNTER — Ambulatory Visit: Payer: Medicaid Other | Admitting: Physical Therapy

## 2012-09-07 ENCOUNTER — Ambulatory Visit: Payer: Medicaid Other | Attending: Family Medicine | Admitting: Physical Therapy

## 2012-09-07 DIAGNOSIS — M545 Low back pain, unspecified: Secondary | ICD-10-CM | POA: Insufficient documentation

## 2012-09-07 DIAGNOSIS — IMO0001 Reserved for inherently not codable concepts without codable children: Secondary | ICD-10-CM | POA: Insufficient documentation

## 2012-09-07 DIAGNOSIS — M6281 Muscle weakness (generalized): Secondary | ICD-10-CM | POA: Insufficient documentation

## 2012-09-07 DIAGNOSIS — R293 Abnormal posture: Secondary | ICD-10-CM | POA: Insufficient documentation

## 2012-09-14 ENCOUNTER — Ambulatory Visit: Payer: Medicaid Other | Admitting: Physical Therapy

## 2012-09-21 ENCOUNTER — Emergency Department (HOSPITAL_COMMUNITY): Payer: Medicaid Other

## 2012-09-21 ENCOUNTER — Encounter (HOSPITAL_COMMUNITY): Payer: Self-pay | Admitting: Emergency Medicine

## 2012-09-21 ENCOUNTER — Ambulatory Visit: Payer: Medicaid Other | Admitting: Physical Therapy

## 2012-09-21 ENCOUNTER — Observation Stay (HOSPITAL_COMMUNITY)
Admission: EM | Admit: 2012-09-21 | Discharge: 2012-09-23 | Disposition: A | Discharge status: Home / Self Care | Payer: Medicaid Other | Attending: Internal Medicine | Admitting: Internal Medicine

## 2012-09-21 DIAGNOSIS — G4733 Obstructive sleep apnea (adult) (pediatric): Secondary | ICD-10-CM | POA: Diagnosis present

## 2012-09-21 DIAGNOSIS — I129 Hypertensive chronic kidney disease with stage 1 through stage 4 chronic kidney disease, or unspecified chronic kidney disease: Secondary | ICD-10-CM | POA: Insufficient documentation

## 2012-09-21 DIAGNOSIS — M109 Gout, unspecified: Secondary | ICD-10-CM | POA: Diagnosis present

## 2012-09-21 DIAGNOSIS — I5032 Chronic diastolic (congestive) heart failure: Secondary | ICD-10-CM | POA: Diagnosis present

## 2012-09-21 DIAGNOSIS — E785 Hyperlipidemia, unspecified: Secondary | ICD-10-CM

## 2012-09-21 DIAGNOSIS — R079 Chest pain, unspecified: Secondary | ICD-10-CM | POA: Diagnosis present

## 2012-09-21 DIAGNOSIS — E119 Type 2 diabetes mellitus without complications: Secondary | ICD-10-CM | POA: Insufficient documentation

## 2012-09-21 DIAGNOSIS — I4510 Unspecified right bundle-branch block: Secondary | ICD-10-CM | POA: Insufficient documentation

## 2012-09-21 DIAGNOSIS — Z794 Long term (current) use of insulin: Secondary | ICD-10-CM | POA: Insufficient documentation

## 2012-09-21 DIAGNOSIS — I10 Essential (primary) hypertension: Secondary | ICD-10-CM | POA: Diagnosis present

## 2012-09-21 DIAGNOSIS — R0789 Other chest pain: Principal | ICD-10-CM | POA: Insufficient documentation

## 2012-09-21 DIAGNOSIS — N189 Chronic kidney disease, unspecified: Secondary | ICD-10-CM | POA: Diagnosis present

## 2012-09-21 DIAGNOSIS — IMO0001 Reserved for inherently not codable concepts without codable children: Secondary | ICD-10-CM

## 2012-09-21 DIAGNOSIS — I509 Heart failure, unspecified: Secondary | ICD-10-CM | POA: Insufficient documentation

## 2012-09-21 DIAGNOSIS — R0602 Shortness of breath: Secondary | ICD-10-CM | POA: Insufficient documentation

## 2012-09-21 LAB — COMPREHENSIVE METABOLIC PANEL
AST: 19 U/L (ref 0–37)
CO2: 23 mEq/L (ref 19–32)
Calcium: 9.1 mg/dL (ref 8.4–10.5)
Creatinine, Ser: 2.24 mg/dL — ABNORMAL HIGH (ref 0.50–1.35)
GFR calc Af Amer: 36 mL/min — ABNORMAL LOW (ref >90)
GFR calc non Af Amer: 31 mL/min — ABNORMAL LOW (ref >90)

## 2012-09-21 LAB — CBC WITH DIFFERENTIAL/PLATELET
Basophils Absolute: 0 10*3/uL (ref 0.0–0.1)
Basophils Relative: 0 % (ref 0–1)
Eosinophils Absolute: 0.2 10*3/uL (ref 0.0–0.7)
Eosinophils Relative: 3 % (ref 0–5)
HCT: 30.5 % — ABNORMAL LOW (ref 39.0–52.0)
Lymphocytes Relative: 36 % (ref 12–46)
MCH: 26.8 pg (ref 26.0–34.0)
MCHC: 34.1 g/dL (ref 30.0–36.0)
MCV: 78.6 fL (ref 78.0–100.0)
Monocytes Absolute: 0.5 10*3/uL (ref 0.1–1.0)
RDW: 14.2 % (ref 11.5–15.5)

## 2012-09-21 LAB — POCT I-STAT TROPONIN I

## 2012-09-21 NOTE — ED Notes (Signed)
PT. REPORTS MID CHEST PAIN WITH SOB AND COUGH FOR 2 DAYS , ALSO REPORTS DIARRHEA /HEADACHE / LOW BACK PAIN .

## 2012-09-22 ENCOUNTER — Encounter (HOSPITAL_COMMUNITY): Payer: Self-pay | Admitting: Internal Medicine

## 2012-09-22 DIAGNOSIS — R079 Chest pain, unspecified: Secondary | ICD-10-CM | POA: Diagnosis present

## 2012-09-22 DIAGNOSIS — E785 Hyperlipidemia, unspecified: Secondary | ICD-10-CM

## 2012-09-22 DIAGNOSIS — I509 Heart failure, unspecified: Secondary | ICD-10-CM

## 2012-09-22 DIAGNOSIS — I5032 Chronic diastolic (congestive) heart failure: Secondary | ICD-10-CM

## 2012-09-22 DIAGNOSIS — E119 Type 2 diabetes mellitus without complications: Secondary | ICD-10-CM

## 2012-09-22 DIAGNOSIS — Z794 Long term (current) use of insulin: Secondary | ICD-10-CM

## 2012-09-22 LAB — CBC
Hemoglobin: 10.1 g/dL — ABNORMAL LOW (ref 13.0–17.0)
MCH: 26.5 pg (ref 26.0–34.0)
MCV: 78.2 fL (ref 78.0–100.0)
RBC: 3.81 MIL/uL — ABNORMAL LOW (ref 4.22–5.81)

## 2012-09-22 LAB — BASIC METABOLIC PANEL
CO2: 24 mEq/L (ref 19–32)
Calcium: 9.1 mg/dL (ref 8.4–10.5)
Glucose, Bld: 109 mg/dL — ABNORMAL HIGH (ref 70–99)
Potassium: 4.3 mEq/L (ref 3.5–5.1)
Sodium: 140 mEq/L (ref 135–145)

## 2012-09-22 LAB — GLUCOSE, CAPILLARY

## 2012-09-22 LAB — MRSA PCR SCREENING: MRSA by PCR: NEGATIVE

## 2012-09-22 LAB — HEMOGLOBIN A1C
Hgb A1c MFr Bld: 7.3 % — ABNORMAL HIGH (ref <5.7)
Mean Plasma Glucose: 163 mg/dL — ABNORMAL HIGH (ref <117)

## 2012-09-22 MED ORDER — SODIUM CHLORIDE 0.9 % IV SOLN
Freq: Once | INTRAVENOUS | Status: AC
  Administered 2012-09-22: 10 mL/h via INTRAVENOUS

## 2012-09-22 MED ORDER — ASPIRIN 325 MG PO TABS
325.0000 mg | ORAL_TABLET | Freq: Every day | ORAL | Status: DC
  Administered 2012-09-23: 325 mg via ORAL
  Filled 2012-09-22 (×2): qty 1

## 2012-09-22 MED ORDER — LATANOPROST 0.005 % OP SOLN
1.0000 [drp] | Freq: Every day | OPHTHALMIC | Status: DC
  Administered 2012-09-22: 1 [drp] via OPHTHALMIC
  Filled 2012-09-22: qty 2.5

## 2012-09-22 MED ORDER — INSULIN GLARGINE 100 UNIT/ML ~~LOC~~ SOLN
65.0000 [IU] | Freq: Every day | SUBCUTANEOUS | Status: DC
  Administered 2012-09-22: 65 [IU] via SUBCUTANEOUS
  Filled 2012-09-22 (×2): qty 0.65

## 2012-09-22 MED ORDER — FUROSEMIDE 40 MG PO TABS
40.0000 mg | ORAL_TABLET | ORAL | Status: DC
  Administered 2012-09-23: 40 mg via ORAL
  Filled 2012-09-22: qty 1

## 2012-09-22 MED ORDER — PREDNISOLONE ACETATE 1 % OP SUSP
1.0000 [drp] | Freq: Every day | OPHTHALMIC | Status: DC
  Administered 2012-09-22: 1 [drp] via OPHTHALMIC
  Filled 2012-09-22 (×2): qty 1

## 2012-09-22 MED ORDER — FEBUXOSTAT 40 MG PO TABS
80.0000 mg | ORAL_TABLET | Freq: Every day | ORAL | Status: DC
  Administered 2012-09-22 – 2012-09-23 (×2): 80 mg via ORAL
  Filled 2012-09-22 (×2): qty 2

## 2012-09-22 MED ORDER — FUROSEMIDE 10 MG/ML IJ SOLN
80.0000 mg | Freq: Once | INTRAMUSCULAR | Status: AC
  Administered 2012-09-22: 80 mg via INTRAVENOUS
  Filled 2012-09-22: qty 8

## 2012-09-22 MED ORDER — FUROSEMIDE 40 MG PO TABS
40.0000 mg | ORAL_TABLET | Freq: Every evening | ORAL | Status: DC
  Administered 2012-09-22: 40 mg via ORAL
  Filled 2012-09-22 (×2): qty 1

## 2012-09-22 MED ORDER — ALLOPURINOL 100 MG PO TABS
100.0000 mg | ORAL_TABLET | Freq: Two times a day (BID) | ORAL | Status: DC
  Administered 2012-09-22 – 2012-09-23 (×3): 100 mg via ORAL
  Filled 2012-09-22 (×4): qty 1

## 2012-09-22 MED ORDER — TRAMADOL-ACETAMINOPHEN 37.5-325 MG PO TABS
1.0000 | ORAL_TABLET | Freq: Four times a day (QID) | ORAL | Status: DC | PRN
  Administered 2012-09-22: 1 via ORAL
  Filled 2012-09-22: qty 1

## 2012-09-22 MED ORDER — BRINZOLAMIDE 1 % OP SUSP
1.0000 [drp] | Freq: Two times a day (BID) | OPHTHALMIC | Status: DC
  Administered 2012-09-22 – 2012-09-23 (×4): 1 [drp] via OPHTHALMIC
  Filled 2012-09-22 (×2): qty 10

## 2012-09-22 MED ORDER — ONDANSETRON HCL 4 MG PO TABS: 4.0000 mg | ORAL_TABLET | Freq: Four times a day (QID) | ORAL | Status: DC | PRN

## 2012-09-22 MED ORDER — LIRAGLUTIDE 18 MG/3ML ~~LOC~~ SOLN
1.8000 mg | Freq: Every morning | SUBCUTANEOUS | Status: DC
  Administered 2012-09-22 – 2012-09-23 (×2): 1.8 mg via SUBCUTANEOUS
  Filled 2012-09-22 (×8): qty 0.3

## 2012-09-22 MED ORDER — LISINOPRIL 10 MG PO TABS
10.0000 mg | ORAL_TABLET | Freq: Every day | ORAL | Status: DC
  Administered 2012-09-22: 10 mg via ORAL
  Filled 2012-09-22 (×2): qty 1

## 2012-09-22 MED ORDER — SODIUM CHLORIDE 0.9 % IJ SOLN
3.0000 mL | Freq: Two times a day (BID) | INTRAMUSCULAR | Status: DC
  Administered 2012-09-22 – 2012-09-23 (×2): 3 mL via INTRAVENOUS

## 2012-09-22 MED ORDER — ISOSORBIDE MONONITRATE ER 30 MG PO TB24
30.0000 mg | ORAL_TABLET | Freq: Every day | ORAL | Status: DC
  Administered 2012-09-22: 30 mg via ORAL
  Filled 2012-09-22: qty 1

## 2012-09-22 MED ORDER — FUROSEMIDE 40 MG PO TABS: 40.0000 mg | ORAL_TABLET | ORAL | Status: DC

## 2012-09-22 MED ORDER — PANTOPRAZOLE SODIUM 40 MG PO TBEC
40.0000 mg | DELAYED_RELEASE_TABLET | Freq: Every day | ORAL | Status: DC
  Administered 2012-09-23: 40 mg via ORAL
  Filled 2012-09-22: qty 1

## 2012-09-22 MED ORDER — ACETAMINOPHEN 650 MG RE SUPP: 650.0000 mg | Freq: Four times a day (QID) | RECTAL | Status: DC | PRN

## 2012-09-22 MED ORDER — ENOXAPARIN SODIUM 40 MG/0.4ML ~~LOC~~ SOLN
40.0000 mg | SUBCUTANEOUS | Status: DC
  Administered 2012-09-22 – 2012-09-23 (×2): 40 mg via SUBCUTANEOUS
  Filled 2012-09-22 (×2): qty 0.4

## 2012-09-22 MED ORDER — INSULIN ASPART 100 UNIT/ML ~~LOC~~ SOLN
0.0000 [IU] | Freq: Three times a day (TID) | SUBCUTANEOUS | Status: DC
  Administered 2012-09-22 (×2): 1 [IU] via SUBCUTANEOUS
  Administered 2012-09-23: 2 [IU] via SUBCUTANEOUS
  Administered 2012-09-23: 1 [IU] via SUBCUTANEOUS

## 2012-09-22 MED ORDER — PANTOPRAZOLE SODIUM 40 MG PO TBEC
40.0000 mg | DELAYED_RELEASE_TABLET | Freq: Every day | ORAL | Status: DC
  Administered 2012-09-22: 40 mg via ORAL
  Filled 2012-09-22 (×2): qty 1

## 2012-09-22 MED ORDER — NITROGLYCERIN 0.4 MG SL SUBL
0.4000 mg | SUBLINGUAL_TABLET | SUBLINGUAL | Status: DC | PRN
  Administered 2012-09-22: 0.4 mg via SUBLINGUAL
  Filled 2012-09-22: qty 25

## 2012-09-22 MED ORDER — FUROSEMIDE 80 MG PO TABS
80.0000 mg | ORAL_TABLET | ORAL | Status: DC
  Administered 2012-09-22: 80 mg via ORAL
  Filled 2012-09-22 (×2): qty 1

## 2012-09-22 MED ORDER — CARVEDILOL 12.5 MG PO TABS
12.5000 mg | ORAL_TABLET | Freq: Two times a day (BID) | ORAL | Status: DC
  Administered 2012-09-23: 12.5 mg via ORAL
  Filled 2012-09-22 (×4): qty 1

## 2012-09-22 MED ORDER — ASPIRIN EC 325 MG PO TBEC
325.0000 mg | DELAYED_RELEASE_TABLET | Freq: Every day | ORAL | Status: DC
  Administered 2012-09-22 – 2012-09-23 (×2): 325 mg via ORAL
  Filled 2012-09-22 (×2): qty 1

## 2012-09-22 MED ORDER — TIMOLOL MALEATE 0.5 % OP SOLN
1.0000 [drp] | Freq: Two times a day (BID) | OPHTHALMIC | Status: DC
  Administered 2012-09-22 – 2012-09-23 (×3): 1 [drp] via OPHTHALMIC
  Filled 2012-09-22 (×2): qty 5

## 2012-09-22 MED ORDER — ONDANSETRON HCL 4 MG/2ML IJ SOLN: 4.0000 mg | Freq: Four times a day (QID) | INTRAMUSCULAR | Status: DC | PRN

## 2012-09-22 MED ORDER — CARVEDILOL 12.5 MG PO TABS
12.5000 mg | ORAL_TABLET | Freq: Two times a day (BID) | ORAL | Status: DC
  Administered 2012-09-22: 12.5 mg via ORAL
  Filled 2012-09-22 (×3): qty 1

## 2012-09-22 MED ORDER — ATORVASTATIN CALCIUM 40 MG PO TABS
40.0000 mg | ORAL_TABLET | Freq: Every day | ORAL | Status: DC
  Administered 2012-09-22: 40 mg via ORAL
  Filled 2012-09-22 (×2): qty 1

## 2012-09-22 MED ORDER — ACETAMINOPHEN 325 MG PO TABS
650.0000 mg | ORAL_TABLET | Freq: Four times a day (QID) | ORAL | Status: DC | PRN
  Administered 2012-09-22: 650 mg via ORAL
  Filled 2012-09-22: qty 2

## 2012-09-22 MED ORDER — INSULIN GLARGINE 100 UNIT/ML ~~LOC~~ SOLN
45.0000 [IU] | Freq: Every day | SUBCUTANEOUS | Status: DC
  Administered 2012-09-22 – 2012-09-23 (×2): 45 [IU] via SUBCUTANEOUS
  Filled 2012-09-22 (×2): qty 0.45

## 2012-09-22 MED ORDER — VITAMIN B-12 1000 MCG PO TABS
1000.0000 ug | ORAL_TABLET | Freq: Every day | ORAL | Status: DC
  Administered 2012-09-22 – 2012-09-23 (×2): 1000 ug via ORAL
  Filled 2012-09-22 (×2): qty 1

## 2012-09-22 MED ORDER — NIACIN ER (ANTIHYPERLIPIDEMIC) 500 MG PO TBCR
1000.0000 mg | EXTENDED_RELEASE_TABLET | Freq: Every day | ORAL | Status: DC
Start: 2012-09-22 — End: 2012-09-23
  Administered 2012-09-22: 1000 mg via ORAL
  Filled 2012-09-22 (×3): qty 2

## 2012-09-22 MED ORDER — CARVEDILOL 25 MG PO TABS
25.0000 mg | ORAL_TABLET | Freq: Two times a day (BID) | ORAL | Status: DC
  Administered 2012-09-22: 25 mg via ORAL
  Filled 2012-09-22 (×3): qty 1

## 2012-09-22 MED ORDER — HYDROCODONE-ACETAMINOPHEN 10-325 MG PO TABS: 1.0000 | ORAL_TABLET | Freq: Four times a day (QID) | ORAL | Status: DC | PRN

## 2012-09-22 MED ORDER — GABAPENTIN 300 MG PO CAPS
300.0000 mg | ORAL_CAPSULE | Freq: Three times a day (TID) | ORAL | Status: DC
  Administered 2012-09-22 – 2012-09-23 (×4): 300 mg via ORAL
  Filled 2012-09-22 (×6): qty 1

## 2012-09-22 MED ORDER — MORPHINE SULFATE 2 MG/ML IJ SOLN
2.0000 mg | Freq: Once | INTRAMUSCULAR | Status: AC
  Administered 2012-09-22: 2 mg via INTRAVENOUS
  Filled 2012-09-22: qty 1

## 2012-09-22 NOTE — Progress Notes (Signed)
Patient admitted to floor from 2100 for further monitoring.  He arrived via bed and was capable of ambulating into the room with one person assist.  He is alert and oriented x4.  Denies chest pain or shortness of breath.  Placed on continuous telemetry and oriented to room and floor procedures.  Resting quietly at present.  Will continue to monitor. Idolina Primer RN

## 2012-09-22 NOTE — ED Notes (Signed)
hospitalist in room at this time 

## 2012-09-22 NOTE — Consult Note (Addendum)
Admit date: 09/21/2012 Referring Physician  Dr. Gonzella Lex Primary Physician Leo Grosser, MD Primary Cardiologist  [facility] Reason for Consultation  Chest pain  HPI: 57 her old male with morbid obesity that is going to undergo bariatric surgery scheduled in approximately one month who underwent nuclear stress test in April of 2013 that was overall low risk showing no evidence of significant ischemia, mildly decreased/low normal ejection fraction of 45%, dilated left ventricle, with echocardiogram performed at the same time with poor acoustic windows, unable to estimate precisely on ejection fraction but likely low normal here with chest discomfort.  He is a history of diabetes, chronic kidney disease, hypertension, sleep apnea and has been having chest discomfort off and on over the past 2 days gradually worsening. He feels it in the left anterior chest wall feels like a pressure radiating to his back and he also has associated shortness of breath and mild diaphoresis. Each time he has the sensation, it can last for 10 minutes. Cardiac biomarkers are unremarkable. EKG shows chronic right bundle branch block with nonspecific ST-T wave changes. Creatinine is 2.16.  He is also had diarrhea that has been quite significant over the past 2 days. He currently feels better from a chest pain perspective.   PMH:   Past Medical History  Diagnosis Date  . Diabetes mellitus   . Hypertension   . Obesity   . Sleep apnea   . Gouty arthritis     PSH:   Past Surgical History  Procedure Laterality Date  . Eye surgery    . Knee surgery     Allergies:  Penicillins Prior to Admit Meds:   Prescriptions prior to admission  Medication Sig Dispense Refill  . allopurinol (ZYLOPRIM) 100 MG tablet Take 100 mg by mouth 2 (two) times daily.        Marland Kitchen aspirin 325 MG tablet Take 325 mg by mouth daily.      . brinzolamide (AZOPT) 1 % ophthalmic suspension Place 1 drop into both eyes 2 (two) times daily.        .  carvedilol (COREG) 12.5 MG tablet Take 12.5 mg by mouth 2 (two) times daily with a meal.        . colchicine 0.6 MG tablet Take 0.6mg  (one tablet) by mouth every 1-2 hours until one of the following occurs: 1.  The pain is gone 2.  The maximum dose has been given ( no more than 3 tabs in 3 hours or 10 tabs in 24 hours) 3.  The side effects outweight the benefits  10 tablet  0  . febuxostat (ULORIC) 40 MG tablet Take 80 mg by mouth daily.      . furosemide (LASIX) 40 MG tablet Take 40-80 mg by mouth See admin instructions. Day 1: 80mg  in AM, 40mg  in PM Day 2: 40mg  in AM, 40mg  in PM Day 3: 80mg  in AM, 40mg  in PM Continue alternating accordingly      . gabapentin (NEURONTIN) 300 MG capsule Take 1 capsule (300 mg total) by mouth 3 (three) times daily.  90 capsule  3  . insulin glargine (LANTUS) 100 UNIT/ML injection Inject 45-56 Units into the skin 2 (two) times daily. 45 units QAM    65 units QPM      . insulin regular human CONCENTRATED (HUMULIN R) 500 UNIT/ML SOLN injection Inject 6-10 Units into the skin 3 (three) times daily with meals. Inject 8 units in AM, 6 units at Lunch, and 10 units at dinner.      Marland Kitchen  latanoprost (XALATAN) 0.005 % ophthalmic solution Place 1 drop into the left eye at bedtime.       . Liraglutide (VICTOZA) 18 MG/3ML SOLN Inject 1.8 mg into the skin every morning.       Marland Kitchen lisinopril (PRINIVIL,ZESTRIL) 10 MG tablet Take 10 mg by mouth daily.        . niacin (NIASPAN) 1000 MG CR tablet Take 1,000 mg by mouth at bedtime.      Marland Kitchen omeprazole (PRILOSEC) 40 MG capsule Take 40 mg by mouth daily.        . prednisoLONE acetate (PRED FORTE) 1 % ophthalmic suspension Place 1 drop into the right eye at bedtime.       . rosuvastatin (CRESTOR) 20 MG tablet Take 20 mg by mouth at bedtime.        . timolol (TIMOPTIC) 0.5 % ophthalmic solution Place 1 drop into both eyes 2 (two) times daily.      . vitamin B-12 (CYANOCOBALAMIN) 1000 MCG tablet Take 1,000 mcg by mouth daily.         Fam HX:     Family History  Problem Relation Age of Onset  . Hypertension Sister   . Diabetes Mellitus II Sister    Social HX:    History   Social History  . Marital Status: Unknown    Spouse Name: N/A    Number of Children: N/A  . Years of Education: N/A   Occupational History  . Not on file.   Social History Main Topics  . Smoking status: Never Smoker   . Smokeless tobacco: Not on file  . Alcohol Use: No  . Drug Use: No  . Sexually Active: Not on file   Other Topics Concern  . Not on file   Social History Narrative  . No narrative on file     ROS:  He denies any bleeding, syncope, significant palpitations, orthopnea, PND. All 11 ROS were addressed and are negative except what is stated in the HPI  Physical Exam: Blood pressure 145/67, pulse 76, temperature 98.3 F (36.8 C), temperature source Axillary, resp. rate 15, height 5\' 10"  (1.778 m), weight 178.6 kg (393 lb 11.9 oz), SpO2 96.00%.    General: Well developed, well nourished, in no acute distress Head: Eyes PERRLA, No xanthomas.   Normal cephalic and atramatic  Lungs:   Clear bilaterally to auscultation and percussion. Normal respiratory effort. No wheezes, no rales. Heart:   HRRR S1 S2 Pulses are 2+ & equal.            No carotid bruit. No JVD.  No abdominal bruits. No femoral bruits. Abdomen: Bowel sounds are positive, abdomen soft and non-tender without masses. No hepatosplenomegaly. Obese Msk:  Back normal. Normal strength and tone for age. Extremities:   No clubbing, cyanosis or edema.  DP +1 Neuro: Alert and oriented X 3, non-focal, MAE x 4 GU: Deferred Rectal: Deferred Psych:  Good affect, responds appropriately    Labs:   Lab Results  Component Value Date   WBC 8.5 09/22/2012   HGB 10.1* 09/22/2012   HCT 29.8* 09/22/2012   MCV 78.2 09/22/2012   PLT 150 09/22/2012    Recent Labs Lab 09/21/12 2134 09/22/12 0520  NA 139 140  K 4.5 4.3  CL 104 106  CO2 23 24  BUN 75* 79*  CREATININE 2.24* 2.16*   CALCIUM 9.1 9.1  PROT 6.8  --   BILITOT 0.2*  --   ALKPHOS 41  --  ALT 18  --   AST 19  --   GLUCOSE 168* 109*    Lab Results  Component Value Date   TROPONINI <0.30 09/22/2012      Radiology:  Dg Chest 2 View  09/21/2012  *RADIOLOGY REPORT*  Clinical Data: Chest pain and shortness of breath.  Dizziness.  CHEST - 2 VIEW  Comparison: None.  Findings: Mild hyperinflation.  Moderate thoracic spondylosis. Midline trachea.  Cardiomegaly. Mediastinal contours otherwise within normal limits.  No pleural effusion or pneumothorax.  No congestive failure.  IMPRESSION: Cardiomegaly without congestive failure.   Original Report Authenticated By: Jeronimo Greaves, M.D.    Personally viewed.  EKG:  Right bundle branch block, chronic, nonspecific ST changes. Personally viewed.   ASSESSMENT/PLAN:   57 year old male with morbid obesity, diabetes, sleep apnea with chest discomfort, negative cardiac biomarkers thus far who underwent nuclear stress test approximately one year ago that was overall low risk with EF calculated at 45% and sensitivity reduced because of attenuation artifact. Has been battling diarrhea past day.   1. Chest pain. Reassuring cardiac markers. Reassuring EKG. It is likely that his chest discomfort may be secondary to gastroenteritis with associated diarrhea. I agree with current management strategy. With his creatinine, I discussed the risks of performing catheterization/angiogram/possible renal impairment. I do not feel that it is necessary to perform this at this time. I would continue with medical management. Nuclear stress test performed one year ago was overall low risk of course with limitations of decreased sensitivity in the setting of morbid obesity. EF approximately 45%. Low normal.  No further cardiac workup necessary at this time. He continues to improve. I still promote bariatric surgery for him. I once again explained to them, he and his wife, the risks of surgery and that  given his obesity he is at increased risk for complications. Given his recent nuclear stress test, I still believe that he may proceed with surgery however. He understands slight increasing cardiac risk. Prior cardiac catheterization took place in 2002 and was normal per patient. This was performed in [place]. I'm fine with him continuing with lisinopril, isosorbide, carvedilol. If hypotensive, in the setting of diarrhea, may need to decrease her hold one of these medications.  2. Gastroenteritis/diarrhea-continue with therapy.  3. Morbid obesity-advocate weight loss.  4. Chronic kidney disease-creatinine 2.16 currently.  5. Diabetes-continue to monitor closely.  6. Right bundle branch block-no change from prior.  At this point, I will signoff. Please call if any questions. He should have followup with me in the office.  Donato Schultz, MD  09/22/2012  10:42 AM

## 2012-09-22 NOTE — ED Provider Notes (Signed)
History     CSN: [id number]  Arrival date & time 09/21/12  2039   First MD Initiated Contact with Patient 09/22/12 0013      Chief Complaint  Patient presents with  . Chest Pain    (Consider location/radiation/quality/duration/timing/severity/associated sxs/prior treatment) HPI This is a 57 year old male morbidly obese with diabetes. He is here with a two-day history of chest pain. He describes the pain as feeling like someone is sitting on his chest. He states it is intermittent but cannot specify any exacerbating or mitigating factors. It is associated with shortness of breath, sweating and nausea. He has also had diarrhea since yesterday morning; this is but accompanied by mild abdominal cramping. He also complains of a headache and tailbone pain. He denies significant cough.  Past Medical History  Diagnosis Date  . Diabetes mellitus   . Hypertension   . Obesity   . Sleep apnea   . Gouty arthritis     History reviewed. No pertinent past surgical history.  No family history on file.  History  Substance Use Topics  . Smoking status: Never Smoker   . Smokeless tobacco: Not on file  . Alcohol Use: No      Review of Systems  All other systems reviewed and are negative.    Allergies  Penicillins  Home Medications   Current Outpatient Rx  Name  Route  Sig  Dispense  Refill  . allopurinol (ZYLOPRIM) 100 MG tablet   Oral   Take 100 mg by mouth 2 (two) times daily.           Marland Kitchen aspirin 325 MG tablet   Oral   Take 325 mg by mouth daily.         . brinzolamide (AZOPT) 1 % ophthalmic suspension   Both Eyes   Place 1 drop into both eyes 2 (two) times daily.           . carvedilol (COREG) 12.5 MG tablet   Oral   Take 12.5 mg by mouth 2 (two) times daily with a meal.           . colchicine 0.6 MG tablet      Take 0.6mg  (one tablet) by mouth every 1-2 hours until one of the following occurs: 1.  The pain is gone 2.  The maximum dose has been given ( no  more than 3 tabs in 3 hours or 10 tabs in 24 hours) 3.  The side effects outweight the benefits   10 tablet   0   . febuxostat (ULORIC) 40 MG tablet   Oral   Take 80 mg by mouth daily.         . furosemide (LASIX) 40 MG tablet   Oral   Take 40-80 mg by mouth See admin instructions. Day 1: 80mg  in AM, 40mg  in PM Day 2: 40mg  in AM, 40mg  in PM Day 3: 80mg  in AM, 40mg  in PM Continue alternating accordingly         . gabapentin (NEURONTIN) 300 MG capsule   Oral   Take 1 capsule (300 mg total) by mouth 3 (three) times daily.   90 capsule   3   . insulin glargine (LANTUS) 100 UNIT/ML injection   Subcutaneous   Inject 45-56 Units into the skin 2 (two) times daily. 45 units QAM    65 units QPM         . insulin regular human CONCENTRATED (HUMULIN R) 500 UNIT/ML SOLN injection   Subcutaneous  Inject 6-10 Units into the skin 3 (three) times daily with meals. Inject 8 units in AM, 6 units at Lunch, and 10 units at dinner.         . latanoprost (XALATAN) 0.005 % ophthalmic solution   Left Eye   Place 1 drop into the left eye at bedtime.          . Liraglutide (VICTOZA) 18 MG/3ML SOLN   Subcutaneous   Inject 1.8 mg into the skin every morning.          Marland Kitchen lisinopril (PRINIVIL,ZESTRIL) 10 MG tablet   Oral   Take 10 mg by mouth daily.           . niacin (NIASPAN) 1000 MG CR tablet   Oral   Take 1,000 mg by mouth at bedtime.         Marland Kitchen omeprazole (PRILOSEC) 40 MG capsule   Oral   Take 40 mg by mouth daily.           . prednisoLONE acetate (PRED FORTE) 1 % ophthalmic suspension   Right Eye   Place 1 drop into the right eye at bedtime.          . rosuvastatin (CRESTOR) 20 MG tablet   Oral   Take 20 mg by mouth at bedtime.           . timolol (TIMOPTIC) 0.5 % ophthalmic solution   Both Eyes   Place 1 drop into both eyes 2 (two) times daily.         . vitamin B-12 (CYANOCOBALAMIN) 1000 MCG tablet   Oral   Take 1,000 mcg by mouth daily.              BP 156/68  Pulse 77  Temp(Src) 98 F (36.7 C) (Oral)  Resp 17  SpO2 98%  Physical Exam General: Well-developed, morbidly obese male in no acute distress; appearance consistent with age of record HENT: normocephalic, atraumatic Eyes: pupils equal round and reactive to light; extraocular muscles intact Neck: supple Heart: regular rate and rhythm Lungs: clear to auscultation bilaterally Chest: tender to palpation Abdomen: soft; obese; mild diffuse tenderness; bowel sounds present Extremities: No deformity; full range of motion; 1+ edema of the lower leg Neurologic: Awake, alert and oriented; motor function intact in all extremities and symmetric; no facial droop Skin: Warm and dry Psychiatric: Flat affect    ED Course  Procedures (including critical care time)     MDM   Nursing notes and vitals signs, including pulse oximetry, reviewed.  Summary of this visit's results, reviewed by myself:  Labs:  Results for orders placed during the hospital encounter of 09/21/12 (from the past 24 hour(s))  CBC WITH DIFFERENTIAL     Status: Abnormal   Collection Time    09/21/12  9:34 PM      Result Value Range   WBC 9.3  4.0 - 10.5 K/uL   RBC 3.88 (*) 4.22 - 5.81 MIL/uL   Hemoglobin 10.4 (*) 13.0 - 17.0 g/dL   HCT 96.0 (*) 45.4 - 09.8 %   MCV 78.6  78.0 - 100.0 fL   MCH 26.8  26.0 - 34.0 pg   MCHC 34.1  30.0 - 36.0 g/dL   RDW 11.9  14.7 - 82.9 %   Platelets 145 (*) 150 - 400 K/uL   Neutrophils Relative 56  43 - 77 %   Neutro Abs 5.2  1.7 - 7.7 K/uL   Lymphocytes Relative 36  12 -  46 %   Lymphs Abs 3.4  0.7 - 4.0 K/uL   Monocytes Relative 5  3 - 12 %   Monocytes Absolute 0.5  0.1 - 1.0 K/uL   Eosinophils Relative 3  0 - 5 %   Eosinophils Absolute 0.2  0.0 - 0.7 K/uL   Basophils Relative 0  0 - 1 %   Basophils Absolute 0.0  0.0 - 0.1 K/uL  COMPREHENSIVE METABOLIC PANEL     Status: Abnormal   Collection Time    09/21/12  9:34 PM      Result Value Range   Sodium 139   135 - 145 mEq/L   Potassium 4.5  3.5 - 5.1 mEq/L   Chloride 104  96 - 112 mEq/L   CO2 23  19 - 32 mEq/L   Glucose, Bld 168 (*) 70 - 99 mg/dL   BUN 75 (*) 6 - 23 mg/dL   Creatinine, Ser 1.61 (*) 0.50 - 1.35 mg/dL   Calcium 9.1  8.4 - 09.6 mg/dL   Total Protein 6.8  6.0 - 8.3 g/dL   Albumin 3.4 (*) 3.5 - 5.2 g/dL   AST 19  0 - 37 U/L   ALT 18  0 - 53 U/L   Alkaline Phosphatase 41  39 - 117 U/L   Total Bilirubin 0.2 (*) 0.3 - 1.2 mg/dL   GFR calc non Af Amer 31 (*) >90 mL/min   GFR calc Af Amer 36 (*) >90 mL/min  POCT I-STAT TROPONIN I     Status: None   Collection Time    09/21/12  9:57 PM      Result Value Range   Troponin i, poc 0.01  0.00 - 0.08 ng/mL   Comment 3             Imaging Studies: Dg Chest 2 View  09/21/2012  *RADIOLOGY REPORT*  Clinical Data: Chest pain and shortness of breath.  Dizziness.  CHEST - 2 VIEW  Comparison: None.  Findings: Mild hyperinflation.  Moderate thoracic spondylosis. Midline trachea.  Cardiomegaly. Mediastinal contours otherwise within normal limits.  No pleural effusion or pneumothorax.  No congestive failure.  IMPRESSION: Cardiomegaly without congestive failure.   Original Report Authenticated By: Jeronimo Greaves, M.D.    EKG Interpretation:  Date & Time: 09/21/2012 20:54 AM  Rate: 76  Rhythm: normal sinus rhythm  QRS Axis: normal  Intervals: normal  ST/T Wave abnormalities: normal  Conduction Disutrbances:right bundle branch block  Narrative Interpretation: LVH  Old EKG Reviewed: none available  1:40 AM Equivocal change in chest pain with one nitroglycerin. Patient refusing additional nitroglycerin due to lightheadedness.        Hanley Seamen, MD 09/23/12 [id number]

## 2012-09-22 NOTE — ED Notes (Signed)
Patient laying on stretcher at this time. Patient complaining of chest pain, describes as pressure and feels like something is sitting on his chest. Patient is also complaining of lower back pain that has been going on for over a week now. Patient is also having diarrhea that started this morning. Plan of care discussed with patient. Family in room with patient. Will continue to monitor.

## 2012-09-22 NOTE — Progress Notes (Signed)
Placed pt. On CPAP via FFM, auto titrate settings (min 5.0-max 20.0) with 2 lpm O2 bleed in.  Pt. Stable and tolerating well. RN aware.

## 2012-09-22 NOTE — ED Notes (Signed)
Patient states first nitro sl made him nauseated and dizzy. States he does not want to take another dose at this time. Will notify EDP.

## 2012-09-22 NOTE — ED Notes (Signed)
Patient report called to [facility] for admission, nurse voiced understanding of report and had no further questions. Patient is in stable condition at this time for transport. Patient will be going to room via stretcher to room 2613.

## 2012-09-22 NOTE — H&P (Addendum)
[facility] History and Physical  Ahamed Hofland RUE:[id number] DOB: 1955-07-20 DOA: 09/21/2012  Referring physician: Dr. Read Drivers. PCP: Leo Grosser, MD  Specialists: Dr. Anne Fu of [facility] cardiology.  Chief Complaint: Chest pain.  HPI: Alexander Hubbard is a 57 y.o. male history of diabetes mellitus type 2, diastolic CHF, chronic kidney disease, hypertension, OSA on CPAP presented to the year because of chest pain. Patient has been having chest pain off and on for last 2 days which gradually worsened during. Patient's chest pain is retrosternal and left anterior chest wall. Feels like pressure radiating to the back and has had associated shortness of breath and today also had mild diaphoresis. Each time it lasts for 10-15 minutes. In the ER EKG chest x-ray and cardiac enzymes were unremarkable and patient will be admitted for further observation and management. Patient states that he has had cardiac stress test done 6 months ago by his cardiologist in preparation for bariatric surgery.  Review of Systems: As presented in the history of presenting illness, rest negative.  Past Medical History  Diagnosis Date  . Diabetes mellitus   . Hypertension   . Obesity   . Sleep apnea   . Gouty arthritis    Past Surgical History  Procedure Laterality Date  . Eye surgery    . Knee surgery     Social History:  reports that he has never smoked. He does not have any smokeless tobacco history on file. He reports that he does not drink alcohol or use illicit drugs. Lives at home. where does patient live-- Can do ADLs. Can patient participate in ADLs?  Allergies  Allergen Reactions  . Penicillins Swelling and Rash    Family History  Problem Relation Age of Onset  . Hypertension Sister   . Diabetes Mellitus II Sister       Prior to Admission medications   Medication Sig Start Date End Date Taking? Authorizing Provider  allopurinol (ZYLOPRIM) 100 MG tablet Take 100 mg by mouth 2 (two) times  daily.     Yes Historical Provider, MD  aspirin 325 MG tablet Take 325 mg by mouth daily.   Yes Historical Provider, MD  brinzolamide (AZOPT) 1 % ophthalmic suspension Place 1 drop into both eyes 2 (two) times daily.     Yes Historical Provider, MD  carvedilol (COREG) 12.5 MG tablet Take 12.5 mg by mouth 2 (two) times daily with a meal.     Yes Historical Provider, MD  colchicine 0.6 MG tablet Take 0.6mg  (one tablet) by mouth every 1-2 hours until one of the following occurs: 1.  The pain is gone 2.  The maximum dose has been given ( no more than 3 tabs in 3 hours or 10 tabs in 24 hours) 3.  The side effects outweight the benefits 11/03/11 11/02/12 Yes Vida Roller, MD  febuxostat (ULORIC) 40 MG tablet Take 80 mg by mouth daily.   Yes Historical Provider, MD  furosemide (LASIX) 40 MG tablet Take 40-80 mg by mouth See admin instructions. Day 1: 80mg  in AM, 40mg  in PM Day 2: 40mg  in AM, 40mg  in PM Day 3: 80mg  in AM, 40mg  in PM Continue alternating accordingly   Yes Historical Provider, MD  gabapentin (NEURONTIN) 300 MG capsule Take 1 capsule (300 mg total) by mouth 3 (three) times daily. 08/31/12  Yes Donita Brooks, MD  insulin glargine (LANTUS) 100 UNIT/ML injection Inject 45-56 Units into the skin 2 (two) times daily. 45 units QAM    65 units  QPM   Yes Historical Provider, MD  insulin regular human CONCENTRATED (HUMULIN R) 500 UNIT/ML SOLN injection Inject 6-10 Units into the skin 3 (three) times daily with meals. Inject 8 units in AM, 6 units at Lunch, and 10 units at dinner.   Yes Historical Provider, MD  latanoprost (XALATAN) 0.005 % ophthalmic solution Place 1 drop into the left eye at bedtime.    Yes Historical Provider, MD  Liraglutide (VICTOZA) 18 MG/3ML SOLN Inject 1.8 mg into the skin every morning.    Yes Historical Provider, MD  lisinopril (PRINIVIL,ZESTRIL) 10 MG tablet Take 10 mg by mouth daily.     Yes Historical Provider, MD  niacin (NIASPAN) 1000 MG CR tablet Take 1,000 mg by mouth  at bedtime.   Yes Historical Provider, MD  omeprazole (PRILOSEC) 40 MG capsule Take 40 mg by mouth daily.     Yes Historical Provider, MD  prednisoLONE acetate (PRED FORTE) 1 % ophthalmic suspension Place 1 drop into the right eye at bedtime.    Yes Historical Provider, MD  rosuvastatin (CRESTOR) 20 MG tablet Take 20 mg by mouth at bedtime.     Yes Historical Provider, MD  timolol (TIMOPTIC) 0.5 % ophthalmic solution Place 1 drop into both eyes 2 (two) times daily.   Yes Historical Provider, MD  vitamin B-12 (CYANOCOBALAMIN) 1000 MCG tablet Take 1,000 mcg by mouth daily.     Yes Historical Provider, MD   Physical Exam: Filed Vitals:   09/22/12 0130 09/22/12 0145 09/22/12 0200 09/22/12 0245  BP: 138/59  142/68 115/55  Pulse: 76  75 72  Temp:      TempSrc:      Resp: 15  16   Weight:  177.81 kg (392 lb)    SpO2: 96%  96% 93%     General:  Well-developed well-nourished.  Eyes:  Anicteric no pallor.  ENT: No discharge from the ears eyes nose and mouth.  Neck: No mass felt.  Cardiovascular: S1-S2 heard.  Respiratory: No rhonchi no crepitations.  Abdomen: Soft nontender bowel sounds present.  Skin: No rash.  Musculoskeletal: Bilateral lower extremity edema.  Psychiatric: Appears normal.  Neurologic: Alert awake oriented to time place and person. Moves all extremities.  Labs on Admission:  Basic Metabolic Panel:  Recent Labs Lab 09/21/12 2134  NA 139  K 4.5  CL 104  CO2 23  GLUCOSE 168*  BUN 75*  CREATININE 2.24*  CALCIUM 9.1   Liver Function Tests:  Recent Labs Lab 09/21/12 2134  AST 19  ALT 18  ALKPHOS 41  BILITOT 0.2*  PROT 6.8  ALBUMIN 3.4*   No results found for this basename: LIPASE, AMYLASE,  in the last 168 hours No results found for this basename: AMMONIA,  in the last 168 hours CBC:  Recent Labs Lab 09/21/12 2134  WBC 9.3  NEUTROABS 5.2  HGB 10.4*  HCT 30.5*  MCV 78.6  PLT 145*   Cardiac Enzymes: No results found for this  basename: CKTOTAL, CKMB, CKMBINDEX, TROPONINI,  in the last 168 hours  BNP (last 3 results) No results found for this basename: PROBNP,  in the last 8760 hours CBG: No results found for this basename: GLUCAP,  in the last 168 hours  Radiological Exams on Admission: Dg Chest 2 View  09/21/2012  *RADIOLOGY REPORT*  Clinical Data: Chest pain and shortness of breath.  Dizziness.  CHEST - 2 VIEW  Comparison: None.  Findings: Mild hyperinflation.  Moderate thoracic spondylosis. Midline trachea.  Cardiomegaly. Mediastinal  contours otherwise within normal limits.  No pleural effusion or pneumothorax.  No congestive failure.  IMPRESSION: Cardiomegaly without congestive failure.   Original Report Authenticated By: Jeronimo Greaves, M.D.     EKG: Independently reviewed. Normal sinus rhythm with RBBB.  Assessment/Plan Principal Problem:   Chest pain Active Problems:   IDDM (insulin dependent diabetes mellitus)   OSA (obstructive sleep apnea)   Gout   Diastolic CHF, chronic   HTN (hypertension)   Renal failure, chronic   1. Chest pain - rule out ACS. Cycle cardiac markers. Aspirin. Nitroglycerin when necessary. Since patient has ongoing chest pain we will admit to step down. Consult patient's cardiologist in a.m. 2. Diabetes mellitus type 2 - continue home medications. Sliding-scale coverage. 3. Hypertension - continue home medications. 4. Diastolic CHF last EF was 60% - continue Lasix. Closely follow intake output and metabolic panel. 5. OSA - CPAP per respiratory. 6. Gout - continue home medications. 7. Chronic kidney disease - creatinine appears at baseline. Closely follow intake output and metabolic panel. 8. Anemia probably from kidney disease - follow CBC. 9. Diarrhea - patient has had a case for episodes of diarrhea today. But denies any abdominal pain or any nausea vomiting. Check stool for C. difficile.    Code Status: Full code.  Family Communication: Patient's son at the bedside.   Disposition Plan: Admit for observation.    Juris Gosnell N. [facility] Pager [phone].  If 7PM-7AM, please contact night-coverage [email or web address] Password [facility] 09/22/2012, 3:03 AM

## 2012-09-22 NOTE — ED Notes (Signed)
Admitting doctor notified that patient is still experiencing chest pain and that 3W does not take patients with current chest pain. Doctor to change admitting floor.

## 2012-09-22 NOTE — Progress Notes (Signed)
Patient seen and examined this am.  s till c/o some chest discomfort. decribes 2/10 substernal chest pressure.  serial CE so far negative. No EKG abnormality.  reports stress t est about 6 mths back in preparation of bariatric surgery scheduled in 4-6 weeks, continue ASA, s/l nitrate. Will add imdur. Increase coreg dose. Has bilateral pitting edema. Admission note reports diastolic dysfn , do not see echo in the system.   i will order for a 2D echo. Check pro BNP. On lasix at home which is continued. i will give him 80 mg IV lasix X 1 dose. Monitor I/O  i have discussed with his cardiologist Dr Anne Fu who will see the patient today. Continue stepdown monitoring for now.

## 2012-09-23 LAB — GLUCOSE, CAPILLARY
Glucose-Capillary: 122 mg/dL — ABNORMAL HIGH (ref 70–99)
Glucose-Capillary: 156 mg/dL — ABNORMAL HIGH (ref 70–99)

## 2012-09-23 MED ORDER — TRAMADOL-ACETAMINOPHEN 37.5-325 MG PO TABS: 1.0000 | ORAL_TABLET | Freq: Four times a day (QID) | ORAL | Status: DC | PRN

## 2012-09-23 NOTE — Discharge Summary (Addendum)
Physician Discharge Summary  Alexander Hubbard ZOX:[id number] DOB: June 04, 1955 DOA: 09/21/2012  PCP: Leo Grosser, MD  Admit date: 09/21/2012 Discharge date: 09/23/2012  Time spent: 20 minutes  Recommendations for Outpatient Follow-up:  Discharge Home with outpatient PCP follow up  Discharge Diagnoses:   Principal Problem:   Chest pain, atypical  Active Problems:   IDDM (insulin dependent diabetes mellitus)   OSA (obstructive sleep apnea)   Gout   Diastolic CHF, chronic   HTN (hypertension)   Morbid obesity   Renal failure, chronic   Discharge Condition: fair  Diet recommendation: diabetic  Filed Weights   09/22/12 0445 09/22/12 2226 09/23/12 0448  Weight: 178.6 kg (393 lb 11.9 oz) 177.5 kg (391 lb 5.1 oz) 176.857 kg (389 lb 14.4 oz)    History of present illness:  Please refer to admission H&P for detail, but in brief, 57 y.o.morbidly obese male with  history of diabetes mellitus type 2, diastolic CHF, chronic kidney disease, hypertension, OSA on CPAP presented with left sided chest pain which was pressure like, radiating to the back ansd associated with SOB and some diaphoresis. . Patient has been having chest pain off and on for last 2 days which gradually worsened. In the ER EKG chest x-ray and cardiac enzymes were unremarkable and patient  admitted for further observation and management.   Hospital Course:  Chest pain  serial troponins and EKG unremarkable. The patient continued on telemetry monitoring. Chest pain that is all that. Continue on home medications including aspirin according and statin. Patient was seen by Dr. Anne Fu 6 months back and had a stress test on in preparation for bariatric surgery which is scheduled in next 4-6 weeks. Dr. Anne Fu evaluated the patient and his it appears that his chest pain is noncardiac and likely related. GERD. As per the stress test it did not show any signs of ischemia and had an EF of 45%. He is clinically stable for discharge  home. His remaining medical issues including diabetes and chronic kidney disease are stable as well. As per cardiologist, he is stable to go ahead for his bariatric surgery.  Procedures:  none  Consultations:  Dr Anne Fu  Discharge Exam: Filed Vitals:   09/22/12 2000 09/22/12 2226 09/23/12 0027 09/23/12 0448  BP:  121/50  136/74  Pulse:  73 74 72  Temp: 97.5 F (36.4 C) 97.4 F (36.3 C)  97.5 F (36.4 C)  TempSrc: Oral Oral  Oral  Resp:  18 18 16   Height:  5\' 10"  (1.778 m)    Weight:  177.5 kg (391 lb 5.1 oz)  176.857 kg (389 lb 14.4 oz)  SpO2:  100% 100% 97%    General: Middle aged morbidly obese male in no acute distress HEENT: No pallor, moist oral mucosa Cardiovascular: Normal S1 and S2, no murmur rub or gallop Respiratory: Clear to auscultation bilaterally, no added sounds Abdomen: Soft, nontender, nondistended Extremities: Warm, 1+ pitting edema bilaterally CNS: AAO x3  Discharge Instructions ,  Future Appointments Provider Department Dept Phone   10/05/2012 2:15 PM Jenelle Mages, PT [facility] [phone]       Medication List    TAKE these medications       allopurinol 100 MG tablet  Commonly known as:  ZYLOPRIM  Take 100 mg by mouth 2 (two) times daily.     aspirin 325 MG tablet  Take 325 mg by mouth daily.     brinzolamide 1 % ophthalmic suspension  Commonly known as:  AZOPT  Place 1 drop into both eyes 2 (two) times daily.     carvedilol 12.5 MG tablet  Commonly known as:  COREG  Take 12.5 mg by mouth 2 (two) times daily with a meal.     colchicine 0.6 MG tablet  Take 0.6mg  (one tablet) by mouth every 1-2 hours until one of the following occurs:  1.  The pain is gone  2.  The maximum dose has been given ( no more than 3 tabs in 3 hours or 10 tabs in 24 hours)  3.  The side effects outweight the benefits     febuxostat 40 MG tablet  Commonly known as:  ULORIC  Take 80 mg by mouth daily.     furosemide  40 MG tablet  Commonly known as:  LASIX  Take 40-80 mg by mouth See admin instructions. Day 1: 80mg  in AM, 40mg  in PM  Day 2: 40mg  in AM, 40mg  in PM  Day 3: 80mg  in AM, 40mg  in PM  Continue alternating accordingly     gabapentin 300 MG capsule  Commonly known as:  NEURONTIN  Take 1 capsule (300 mg total) by mouth 3 (three) times daily.     HUMULIN R 500 UNIT/ML Soln injection  Generic drug:  insulin regular human CONCENTRATED  Inject 6-10 Units into the skin 3 (three) times daily with meals. Inject 8 units in AM, 6 units at Lunch, and 10 units at dinner.     insulin glargine 100 UNIT/ML injection  Commonly known as:  LANTUS  Inject 45-56 Units into the skin 2 (two) times daily. 45 units QAM    65 units QPM     latanoprost 0.005 % ophthalmic solution  Commonly known as:  XALATAN  Place 1 drop into the left eye at bedtime.     lisinopril 10 MG tablet  Commonly known as:  PRINIVIL,ZESTRIL  Take 10 mg by mouth daily.     niacin 1000 MG CR tablet  Commonly known as:  NIASPAN  Take 1,000 mg by mouth at bedtime.     omeprazole 40 MG capsule  Commonly known as:  PRILOSEC  Take 40 mg by mouth daily.     prednisoLONE acetate 1 % ophthalmic suspension  Commonly known as:  PRED FORTE  Place 1 drop into the right eye at bedtime.     rosuvastatin 20 MG tablet  Commonly known as:  CRESTOR  Take 20 mg by mouth at bedtime.     timolol 0.5 % ophthalmic solution  Commonly known as:  TIMOPTIC  Place 1 drop into both eyes 2 (two) times daily.     traMADol-acetaminophen 37.5-325 MG per tablet  Commonly known as:  ULTRACET  Take 1 tablet by mouth every 6 (six) hours as needed.     VICTOZA 18 MG/3ML Soln injection  Generic drug:  Liraglutide  Inject 1.8 mg into the skin every morning.     vitamin B-12 1000 MCG tablet  Commonly known as:  CYANOCOBALAMIN  Take 1,000 mcg by mouth daily.           Follow-up Information   Follow up with [facility] TOM, MD In 1 week.    Contact information:   [address] [place] [address] [phone]        The results of significant diagnostics from this hospitalization (including imaging, microbiology, ancillary and laboratory) are listed below for reference.    Significant Diagnostic Studies: Dg Chest 2 View  09/21/2012  *RADIOLOGY  REPORT*  Clinical Data: Chest pain and shortness of breath.  Dizziness.  CHEST - 2 VIEW  Comparison: None.  Findings: Mild hyperinflation.  Moderate thoracic spondylosis. Midline trachea.  Cardiomegaly. Mediastinal contours otherwise within normal limits.  No pleural effusion or pneumothorax.  No congestive failure.  IMPRESSION: Cardiomegaly without congestive failure.   Original Report Authenticated By: Jeronimo Greaves, M.D.     Microbiology: Recent Results (from the past 240 hour(s))  MRSA PCR SCREENING     Status: None   Collection Time    09/22/12  4:36 AM      Result Value Range Status   MRSA by PCR NEGATIVE  NEGATIVE Final   Comment:            The GeneXpert MRSA Assay (FDA     approved for NASAL specimens     only), is one component of a     comprehensive MRSA colonization     surveillance program. It is not     intended to diagnose MRSA     infection nor to guide or     monitor treatment for     MRSA infections.     Labs: Basic Metabolic Panel:  Recent Labs Lab 09/21/12 2134 09/22/12 0520  NA 139 140  K 4.5 4.3  CL 104 106  CO2 23 24  GLUCOSE 168* 109*  BUN 75* 79*  CREATININE 2.24* 2.16*  CALCIUM 9.1 9.1   Liver Function Tests:  Recent Labs Lab 09/21/12 2134  AST 19  ALT 18  ALKPHOS 41  BILITOT 0.2*  PROT 6.8  ALBUMIN 3.4*   No results found for this basename: LIPASE, AMYLASE,  in the last 168 hours No results found for this basename: AMMONIA,  in the last 168 hours CBC:  Recent Labs Lab 09/21/12 2134 09/22/12 0520  WBC 9.3 8.5  NEUTROABS 5.2  --   HGB 10.4* 10.1*  HCT 30.5* 29.8*  MCV 78.6 78.2  PLT 145* 150   Cardiac  Enzymes:  Recent Labs Lab 09/22/12 0520 09/22/12 0927 09/22/12 1629  TROPONINI <0.30 <0.30 <0.30   BNP: BNP (last 3 results)  Recent Labs  09/22/12 0927  PROBNP 225.2*   CBG:  Recent Labs Lab 09/22/12 0830 09/22/12 1149 09/22/12 1619 09/22/12 2139 09/23/12 0552  GLUCAP 118* 149* 134* 146* 122*       Signed:  Watt Geiler  [facility] 09/23/2012, 9:22 AM

## 2012-09-29 ENCOUNTER — Ambulatory Visit (INDEPENDENT_AMBULATORY_CARE_PROVIDER_SITE_OTHER): Payer: Medicaid Other | Admitting: Family Medicine

## 2012-09-29 ENCOUNTER — Encounter: Payer: Self-pay | Admitting: Family Medicine

## 2012-09-29 VITALS — BP 130/92 | HR 78 | Temp 98.1°F | Resp 20 | Wt 393.0 lb

## 2012-09-29 DIAGNOSIS — N189 Chronic kidney disease, unspecified: Secondary | ICD-10-CM

## 2012-09-29 DIAGNOSIS — N289 Disorder of kidney and ureter, unspecified: Secondary | ICD-10-CM

## 2012-09-29 DIAGNOSIS — R079 Chest pain, unspecified: Secondary | ICD-10-CM

## 2012-09-29 LAB — CBC WITH DIFFERENTIAL/PLATELET
HCT: 32.9 % — ABNORMAL LOW (ref 39.0–52.0)
Hemoglobin: 10.8 g/dL — ABNORMAL LOW (ref 13.0–17.0)
Lymphocytes Relative: 33 % (ref 12–46)
Lymphs Abs: 2.7 10*3/uL (ref 0.7–4.0)
Monocytes Absolute: 0.4 10*3/uL (ref 0.1–1.0)
Monocytes Relative: 5 % (ref 3–12)
Neutro Abs: 4.7 10*3/uL (ref 1.7–7.7)
WBC: 8.1 10*3/uL (ref 4.0–10.5)

## 2012-09-29 LAB — BASIC METABOLIC PANEL
CO2: 23 mEq/L (ref 19–32)
Calcium: 9.4 mg/dL (ref 8.4–10.5)
Sodium: 140 mEq/L (ref 135–145)

## 2012-09-29 NOTE — Progress Notes (Signed)
Subjective:    Patient ID: Alexander Hubbard, male    DOB: 05-Sep-1955, 57 y.o.   MRN: [id number]  HPI  Patient was recently admitted to the hospital with chest pain. He was kept overnight. Serial cardiac enzymes were negative. Chest x-ray was significant for cardiomegaly. He recently had a stress test which revealed no ischemia. Cardiology was consulted and they felt the chest pain was noncardiac in origin. He is here today for followup.  When asked to elaborate, the patient states the pain occurred while he was driving in the car. That day he did have substantial diarrhea. He also been smoking on a hooka pipe his nephew had given him.  He now feels that mixture he was smoking may have caused his chest pain.  He describes the pain as a tightness under his sternum. When that began, he became very anxious and scared. Then he developed a panic attack which only exacerbated his symptoms. This sent him to the emergency room.  Since leaving the hospital he has been fine. He denies chest pain, shortness of breath, or dyspnea on exertion beyond his normal baseline. He is continuing to proceed with bariatric surgery. He is scheduled to meet with the nutritionist the first week of May. He will see a psychologist thereafter. Pending this evaluation he would proceed with surgery.    However during the hospitalization, his creatinine was elevated at 2.2. His baseline creatinine is 1.3. Therefore this needs to be evaluated further. At the time he was having diarrhea. He could have been dehydrated. Furthermore he takes Lasix every day. Past Medical History  Diagnosis Date  . Diabetes mellitus   . Hypertension   . Obesity   . Sleep apnea   . Gouty arthritis    Current Outpatient Prescriptions on File Prior to Visit  Medication Sig Dispense Refill  . allopurinol (ZYLOPRIM) 100 MG tablet Take 100 mg by mouth 2 (two) times daily.        Marland Kitchen aspirin 325 MG tablet Take 325 mg by mouth daily.      . brinzolamide (AZOPT) 1 %  ophthalmic suspension Place 1 drop into both eyes 2 (two) times daily.        . carvedilol (COREG) 12.5 MG tablet Take 12.5 mg by mouth 2 (two) times daily with a meal.        . colchicine 0.6 MG tablet Take 0.6mg  (one tablet) by mouth every 1-2 hours until one of the following occurs: 1.  The pain is gone 2.  The maximum dose has been given ( no more than 3 tabs in 3 hours or 10 tabs in 24 hours) 3.  The side effects outweight the benefits  10 tablet  0  . febuxostat (ULORIC) 40 MG tablet Take 80 mg by mouth daily.      . furosemide (LASIX) 40 MG tablet Take 40-80 mg by mouth See admin instructions. Day 1: 80mg  in AM, 40mg  in PM Day 2: 40mg  in AM, 40mg  in PM Day 3: 80mg  in AM, 40mg  in PM Continue alternating accordingly      . gabapentin (NEURONTIN) 300 MG capsule Take 1 capsule (300 mg total) by mouth 3 (three) times daily.  90 capsule  3  . insulin glargine (LANTUS) 100 UNIT/ML injection Inject 45-56 Units into the skin 2 (two) times daily. 45 units QAM    65 units QPM      . insulin regular human CONCENTRATED (HUMULIN R) 500 UNIT/ML SOLN injection Inject 6-10 Units into the  skin 3 (three) times daily with meals. Inject 8 units in AM, 6 units at Lunch, and 10 units at dinner.      . latanoprost (XALATAN) 0.005 % ophthalmic solution Place 1 drop into the left eye at bedtime.       . Liraglutide (VICTOZA) 18 MG/3ML SOLN Inject 1.8 mg into the skin every morning.       Marland Kitchen lisinopril (PRINIVIL,ZESTRIL) 10 MG tablet Take 10 mg by mouth daily.        . niacin (NIASPAN) 1000 MG CR tablet Take 1,000 mg by mouth at bedtime.      Marland Kitchen omeprazole (PRILOSEC) 40 MG capsule Take 40 mg by mouth daily.        . prednisoLONE acetate (PRED FORTE) 1 % ophthalmic suspension Place 1 drop into the right eye at bedtime.       . rosuvastatin (CRESTOR) 20 MG tablet Take 20 mg by mouth at bedtime.        . timolol (TIMOPTIC) 0.5 % ophthalmic solution Place 1 drop into both eyes 2 (two) times daily.      .  traMADol-acetaminophen (ULTRACET) 37.5-325 MG per tablet Take 1 tablet by mouth every 6 (six) hours as needed.  30 tablet  0  . vitamin B-12 (CYANOCOBALAMIN) 1000 MCG tablet Take 1,000 mcg by mouth daily.         No current facility-administered medications on file prior to visit.   Allergies  Allergen Reactions  . Penicillins Swelling and Rash   History   Social History  . Marital Status: Unknown    Spouse Name: N/A    Number of Children: N/A  . Years of Education: N/A   Occupational History  . Not on file.   Social History Main Topics  . Smoking status: Never Smoker   . Smokeless tobacco: Not on file  . Alcohol Use: No  . Drug Use: No  . Sexually Active: Not on file   Other Topics Concern  . Not on file   Social History Narrative  . No narrative on file   Family History  Problem Relation Age of Onset  . Hypertension Sister   . Diabetes Mellitus II Sister      Review of Systems  All other systems reviewed and are negative.       Objective:   Physical Exam  Constitutional: He appears well-developed and well-nourished.  HENT:  Head: Normocephalic.  Right Ear: External ear normal.  Left Ear: External ear normal.  Mouth/Throat: Oropharynx is clear and moist.  Eyes: Conjunctivae and EOM are normal. Pupils are equal, round, and reactive to light.  Neck: Normal range of motion. Neck supple. No thyromegaly present.  Cardiovascular: Normal rate, regular rhythm, normal heart sounds and intact distal pulses.  Exam reveals no gallop and no friction rub.   No murmur heard. Pulmonary/Chest: Effort normal and breath sounds normal. No respiratory distress. He has no wheezes. He has no rales. He exhibits no tenderness.  Abdominal: Soft. Bowel sounds are normal. He exhibits no distension. There is no tenderness. There is no rebound.  Lymphadenopathy:    He has no cervical adenopathy.          Assessment & Plan:  1. Acute on chronic renal insufficiency Check a BMP  today. Creatinine is continuing to be elevated, we will likely need to hold Lasix temporarily. May also need to get renal ultrasound to rule out hydronephrosis or obstruction. - Basic Metabolic Panel - CBC with Differential  2.  Chest pain I believe this is likely a panic attack superimposed on possibly a bronchospasm versus GERD.  I do not believe is cardiac in origin he had furthermore the patient is asymptomatic at this time. I feel no further followup is necessary for this isolated incident.  3. Morbid obesity Strongly encouraged the patient to followup with bariatric surgeons as planned. I do not feel that this event should hinder his bariatric surgery at all. He has had a negative cardiac workup to date including a stress test. Furthermore cardiology continues to agree that he is relatively safe to proceed with surgery given his other medical comorbidities.

## 2012-09-30 ENCOUNTER — Telehealth: Payer: Self-pay | Admitting: Family Medicine

## 2012-09-30 ENCOUNTER — Other Ambulatory Visit: Payer: Self-pay | Admitting: Family Medicine

## 2012-09-30 NOTE — Telephone Encounter (Signed)
Questions answered.

## 2012-10-05 ENCOUNTER — Ambulatory Visit (INDEPENDENT_AMBULATORY_CARE_PROVIDER_SITE_OTHER): Payer: Medicaid Other | Admitting: Family Medicine

## 2012-10-05 ENCOUNTER — Ambulatory Visit: Payer: Medicaid Other | Attending: Family Medicine | Admitting: Physical Therapy

## 2012-10-05 ENCOUNTER — Encounter: Payer: Self-pay | Admitting: Family Medicine

## 2012-10-05 VITALS — BP 136/78 | HR 88 | Temp 98.2°F | Resp 20 | Ht 70.0 in | Wt 390.0 lb

## 2012-10-05 DIAGNOSIS — IMO0001 Reserved for inherently not codable concepts without codable children: Secondary | ICD-10-CM | POA: Insufficient documentation

## 2012-10-05 DIAGNOSIS — M545 Low back pain, unspecified: Secondary | ICD-10-CM | POA: Insufficient documentation

## 2012-10-05 DIAGNOSIS — R293 Abnormal posture: Secondary | ICD-10-CM | POA: Insufficient documentation

## 2012-10-05 DIAGNOSIS — M6281 Muscle weakness (generalized): Secondary | ICD-10-CM | POA: Insufficient documentation

## 2012-10-05 DIAGNOSIS — J069 Acute upper respiratory infection, unspecified: Secondary | ICD-10-CM

## 2012-10-05 DIAGNOSIS — N184 Chronic kidney disease, stage 4 (severe): Secondary | ICD-10-CM

## 2012-10-05 NOTE — Progress Notes (Signed)
Subjective:    Patient ID: Alexander Hubbard, male    DOB: [birth year], 57 y.o.   MRN: [id number]  HPI Patient reports 2 days of rhinorrhea, congestion, and postnasal drip. He denies any fevers. He denies any chills. He has very little cough. He does have some sinus pressure. Recently his wife and his daughter were diagnosed with upper respiratory infections. His wife was given an antibiotic at an urgent care. That his daughter most likely had a viral syndrome, but I did prescribe a Z-Pak when she waited one week and developed an ear infection. The patient is requesting a medication to treat his infection today.  He also requests a note be sent to [facility] detailing the we have discussed therapeutic lifestyle changes for weight loss. He is currently pursuing bariatric surgery at that clinic. Past Medical History  Diagnosis Date  . Diabetes mellitus   . Hypertension   . Obesity   . Sleep apnea   . Gouty arthritis    Current Outpatient Prescriptions on File Prior to Visit  Medication Sig Dispense Refill  . allopurinol (ZYLOPRIM) 100 MG tablet Take 100 mg by mouth 2 (two) times daily.        Marland Kitchen aspirin 325 MG tablet Take 325 mg by mouth daily.      . brinzolamide (AZOPT) 1 % ophthalmic suspension Place 1 drop into both eyes 2 (two) times daily.        . carvedilol (COREG) 12.5 MG tablet Take 12.5 mg by mouth 2 (two) times daily with a meal.        . colchicine 0.6 MG tablet 1 tab po qd  30 tablet  2  . CRESTOR 20 MG tablet TAKE 1 TABLET BY MOUTH DAILY AT BEDTIME  30 tablet  2  . furosemide (LASIX) 40 MG tablet Take 40-80 mg by mouth 2 (two) times daily.       Marland Kitchen gabapentin (NEURONTIN) 300 MG capsule Take 1 capsule (300 mg total) by mouth 3 (three) times daily.  90 capsule  3  . insulin glargine (LANTUS) 100 UNIT/ML injection Inject 45-56 Units into the skin 2 (two) times daily. 45 units QAM    65 units QPM      . insulin regular human CONCENTRATED (HUMULIN R) 500 UNIT/ML SOLN  injection Inject 6-10 Units into the skin 3 (three) times daily with meals. Inject 8 units in AM, 6 units at Lunch, and 10 units at dinner.      . latanoprost (XALATAN) 0.005 % ophthalmic solution Place 1 drop into the left eye at bedtime.       . Liraglutide (VICTOZA) 18 MG/3ML SOLN Inject 1.8 mg into the skin every morning.       Marland Kitchen lisinopril (PRINIVIL,ZESTRIL) 10 MG tablet TAKE 1 TABLET BY MOUTH DAILY  30 tablet  2  . niacin (NIASPAN) 1000 MG CR tablet Take 1,000 mg by mouth at bedtime.      Marland Kitchen omeprazole (PRILOSEC) 40 MG capsule Take 40 mg by mouth daily.        . prednisoLONE acetate (PRED FORTE) 1 % ophthalmic suspension Place 1 drop into the right eye at bedtime.       . timolol (TIMOPTIC) 0.5 % ophthalmic solution Place 1 drop into both eyes 2 (two) times daily.      . traMADol-acetaminophen (ULTRACET) 37.5-325 MG per tablet Take 1 tablet by mouth every 6 (six) hours as needed.  30 tablet  0  . ULORIC 40 MG tablet  TAKE 1 TABLET BY MOUTH EVERY DAY  30 tablet  2  . vitamin B-12 (CYANOCOBALAMIN) 1000 MCG tablet Take 1,000 mcg by mouth daily.         No current facility-administered medications on file prior to visit.   Allergies  Allergen Reactions  . Penicillins Swelling and Rash   History   Social History  . Marital Status: Unknown    Spouse Name: N/A    Number of Children: N/A  . Years of Education: N/A   Occupational History  . Not on file.   Social History Main Topics  . Smoking status: Never Smoker   . Smokeless tobacco: Not on file  . Alcohol Use: No  . Drug Use: No  . Sexually Active: Not on file   Other Topics Concern  . Not on file   Social History Narrative  . No narrative on file      Review of Systems  All other systems reviewed and are negative.       Objective:   Physical Exam  Constitutional: He appears well-developed and well-nourished.  HENT:  Head: Normocephalic.  Right Ear: External ear normal.  Left Ear: External ear normal.  Nose:  Rhinorrhea present. Right sinus exhibits no maxillary sinus tenderness and no frontal sinus tenderness. Left sinus exhibits no maxillary sinus tenderness and no frontal sinus tenderness.  Mouth/Throat: Oropharynx is clear and moist.  Eyes: Conjunctivae are normal. Pupils are equal, round, and reactive to light.  Neck: Normal range of motion. No JVD present.  Cardiovascular: Normal rate, regular rhythm and normal heart sounds.   No murmur heard. Pulmonary/Chest: Effort normal and breath sounds normal. No respiratory distress. He has no wheezes. He has no rales.  Lymphadenopathy:    He has no cervical adenopathy.          Assessment & Plan:  1. Chronic kidney disease (CKD), stage III Please see last office visit. The patient did recheck his BMP after decreasing his Lasix dose to reevaluate his creatinine. - Basic Metabolic Panel  2. Acute URI This likely represents a viral URI. I explained this at length to the patient. I recommended tincture of time. He takes Zyrtec over-the-counter for congestion. This is due to his multiple medical problems, as he cannot take decongestants. If he develops fevers chills or sinus pain I will call out an antibiotic for the patient.  Explained to the patient that this is a virus and that antibiotics do not treat viruses.  Patient expresses understanding, and will try the Zyrtec.

## 2012-10-06 LAB — BASIC METABOLIC PANEL
BUN: 64 mg/dL — ABNORMAL HIGH (ref 6–23)
Chloride: 107 mEq/L (ref 96–112)
Glucose, Bld: 127 mg/dL — ABNORMAL HIGH (ref 70–99)
Potassium: 5 mEq/L (ref 3.5–5.3)

## 2012-10-12 ENCOUNTER — Encounter (HOSPITAL_BASED_OUTPATIENT_CLINIC_OR_DEPARTMENT_OTHER): Payer: Medicaid Other | Attending: General Surgery

## 2012-10-12 DIAGNOSIS — L97909 Non-pressure chronic ulcer of unspecified part of unspecified lower leg with unspecified severity: Secondary | ICD-10-CM | POA: Insufficient documentation

## 2012-11-02 ENCOUNTER — Encounter (HOSPITAL_BASED_OUTPATIENT_CLINIC_OR_DEPARTMENT_OTHER): Payer: Medicaid Other | Attending: General Surgery

## 2012-11-02 DIAGNOSIS — L97909 Non-pressure chronic ulcer of unspecified part of unspecified lower leg with unspecified severity: Secondary | ICD-10-CM | POA: Insufficient documentation

## 2012-11-02 DIAGNOSIS — I87319 Chronic venous hypertension (idiopathic) with ulcer of unspecified lower extremity: Secondary | ICD-10-CM | POA: Insufficient documentation

## 2012-11-05 ENCOUNTER — Other Ambulatory Visit: Payer: Self-pay | Admitting: Internal Medicine

## 2012-11-05 ENCOUNTER — Other Ambulatory Visit: Payer: Self-pay | Admitting: Family Medicine

## 2012-11-05 NOTE — Telephone Encounter (Signed)
Medication refilled per protocol. 

## 2012-11-08 ENCOUNTER — Other Ambulatory Visit: Payer: Self-pay | Admitting: Family Medicine

## 2012-11-08 NOTE — Telephone Encounter (Signed)
Ok to refill 

## 2012-11-08 NOTE — Telephone Encounter (Signed)
?   OK to Refill  

## 2012-11-30 ENCOUNTER — Encounter (HOSPITAL_BASED_OUTPATIENT_CLINIC_OR_DEPARTMENT_OTHER): Payer: Medicaid Other | Attending: General Surgery

## 2012-11-30 DIAGNOSIS — L97909 Non-pressure chronic ulcer of unspecified part of unspecified lower leg with unspecified severity: Secondary | ICD-10-CM | POA: Insufficient documentation

## 2012-11-30 DIAGNOSIS — I87319 Chronic venous hypertension (idiopathic) with ulcer of unspecified lower extremity: Secondary | ICD-10-CM | POA: Insufficient documentation

## 2012-12-23 ENCOUNTER — Other Ambulatory Visit: Payer: Medicaid Other

## 2012-12-23 ENCOUNTER — Other Ambulatory Visit: Payer: Self-pay | Admitting: *Deleted

## 2012-12-23 DIAGNOSIS — E785 Hyperlipidemia, unspecified: Secondary | ICD-10-CM

## 2012-12-23 DIAGNOSIS — E119 Type 2 diabetes mellitus without complications: Secondary | ICD-10-CM

## 2012-12-31 ENCOUNTER — Encounter (HOSPITAL_BASED_OUTPATIENT_CLINIC_OR_DEPARTMENT_OTHER): Payer: Medicaid Other | Attending: General Surgery

## 2012-12-31 ENCOUNTER — Other Ambulatory Visit: Payer: Medicaid Other

## 2012-12-31 DIAGNOSIS — I87319 Chronic venous hypertension (idiopathic) with ulcer of unspecified lower extremity: Secondary | ICD-10-CM | POA: Insufficient documentation

## 2012-12-31 DIAGNOSIS — L97909 Non-pressure chronic ulcer of unspecified part of unspecified lower leg with unspecified severity: Secondary | ICD-10-CM | POA: Insufficient documentation

## 2013-01-01 ENCOUNTER — Other Ambulatory Visit: Payer: Self-pay | Admitting: Family Medicine

## 2013-01-03 ENCOUNTER — Other Ambulatory Visit (INDEPENDENT_AMBULATORY_CARE_PROVIDER_SITE_OTHER): Payer: Medicaid Other

## 2013-01-03 DIAGNOSIS — E119 Type 2 diabetes mellitus without complications: Secondary | ICD-10-CM

## 2013-01-03 DIAGNOSIS — E785 Hyperlipidemia, unspecified: Secondary | ICD-10-CM

## 2013-01-03 LAB — COMPREHENSIVE METABOLIC PANEL
AST: 16 U/L (ref 0–37)
Albumin: 3.3 g/dL — ABNORMAL LOW (ref 3.5–5.2)
Alkaline Phosphatase: 36 U/L — ABNORMAL LOW (ref 39–117)
BUN: 49 mg/dL — ABNORMAL HIGH (ref 6–23)
Potassium: 5 mEq/L (ref 3.5–5.1)
Sodium: 140 mEq/L (ref 135–145)
Total Bilirubin: 0.3 mg/dL (ref 0.3–1.2)
Total Protein: 6.3 g/dL (ref 6.0–8.3)

## 2013-01-03 LAB — LIPID PANEL
LDL Cholesterol: 20 mg/dL (ref 0–99)
VLDL: 38.2 mg/dL (ref 0.0–40.0)

## 2013-01-04 ENCOUNTER — Ambulatory Visit (INDEPENDENT_AMBULATORY_CARE_PROVIDER_SITE_OTHER): Payer: Medicaid Other | Admitting: Endocrinology

## 2013-01-04 ENCOUNTER — Encounter: Payer: Self-pay | Admitting: Endocrinology

## 2013-01-04 ENCOUNTER — Encounter (HOSPITAL_BASED_OUTPATIENT_CLINIC_OR_DEPARTMENT_OTHER): Payer: Medicaid Other

## 2013-01-04 ENCOUNTER — Ambulatory Visit: Payer: Medicaid Other | Admitting: Endocrinology

## 2013-01-04 ENCOUNTER — Other Ambulatory Visit: Payer: Medicaid Other

## 2013-01-04 DIAGNOSIS — E1149 Type 2 diabetes mellitus with other diabetic neurological complication: Secondary | ICD-10-CM

## 2013-01-04 DIAGNOSIS — E785 Hyperlipidemia, unspecified: Secondary | ICD-10-CM

## 2013-01-04 LAB — URINALYSIS, ROUTINE W REFLEX MICROSCOPIC
Nitrite: NEGATIVE
Specific Gravity, Urine: 1.02 (ref 1.000–1.030)
Urine Glucose: 100
Urobilinogen, UA: 0.2 (ref 0.0–1.0)

## 2013-01-04 LAB — MICROALBUMIN / CREATININE URINE RATIO: Creatinine,U: 71 mg/dL

## 2013-01-04 NOTE — Patient Instructions (Addendum)
Please check blood sugars at least half the time about 2 hours after any meal and as directed on waking up. Call readings next week Please bring blood sugar monitor to each visit

## 2013-01-04 NOTE — Progress Notes (Signed)
Patient ID: Alexander Hubbard, male   DOB: Jul 25, 1955, 57 y.o.   MRN: [id number]  Xsavier Hubbard is an 57 y.o. male.   Reason for Appointment: Diabetes follow-up   History of Present Illness   Diagnosis: Type 2 DIABETES MELITUS, insulin requiring     Insulin regimen: LANTUS 45 in the morning and 65 in the evening with U.-500 insulin a.c. T.i.d., 01-05-09  PAST history:He has been on relatively large doses of insulin for a few years and was not well controlled until he started the U.-500 insulin This particularly help his fasting hyperglycemia which was difficult to control. He only benefited partially from Victoza and this did not help him with weight loss significantly Overall he has been trying more recently to be compliant with his diet and exercise as he is capable of   RECENT history:The patient's diabetes control is not as good recently with periodic high morning readings He thinks this is from his irregular eating habits and inconsistent insulin doses from his religious fast. He would eat a meal at about 4 AM and 8 PM and not take any insulin during the day Since his blood sugars are occasionally low at 3 AM this may be from the Lantus He has resumed his normal diet the last 3 days and has been resuming his previous doses of U-500 insulin  Overall average blood sugar recently 167 but mostly morning readings  Proper timing of medications in relation to meals: Yes.         Monitors blood glucose: Once a day.    Glucometer: Accu-Chek        Blood Glucose readings from meter download: readings early morning 70-137, around 8-10 AM 128-244, afternoon 101-166 and no readings after 6 PM Hypoglycemia frequency: minimal but he felt symptomatic with glucose of 70.          Meals: 3 meals per day. his wife usually watches his carbohydrate and fat intake         Physical activity: exercise: some walking            Previous A1c was under 8%  Lab Results  Component Value Date   HGBA1C 8.0* 01/03/2013       Medication List       This list is accurate as of: 01/04/13 10:54 AM.  Always use your most recent med list.               aspirin 325 MG tablet  Take 325 mg by mouth daily.     brinzolamide 1 % ophthalmic suspension  Commonly known as:  AZOPT  Place 1 drop into both eyes 2 (two) times daily.     carvedilol 12.5 MG tablet  Commonly known as:  COREG  TAKE 1 TABLET BY MOUTH TWICE A DAY     COLCRYS 0.6 MG tablet  Generic drug:  colchicine  TAKE 1 TABLET BY MOUTH EVERY DAY     CRESTOR 20 MG tablet  Generic drug:  rosuvastatin  TAKE 1 TABLET BY MOUTH DAILY AT BEDTIME     furosemide 40 MG tablet  Commonly known as:  LASIX  TAKE 1 TABLET BY MOUTH TWICE A DAY     gabapentin 300 MG capsule  Commonly known as:  NEURONTIN  TAKE 1 CAPSULE BY MOUTH 3 TIMES DAILY.     HUMULIN R 500 UNIT/ML Soln injection  Generic drug:  insulin regular human CONCENTRATED  Inject 6-10 Units into the skin 3 (three) times daily with meals. Inject  8 units in AM, 6 units at Lunch, and 10 units at dinner.     insulin glargine 100 UNIT/ML injection  Commonly known as:  LANTUS  Inject 45-56 Units into the skin 2 (two) times daily. 45 units QAM    65 units QPM     latanoprost 0.005 % ophthalmic solution  Commonly known as:  XALATAN  Place 1 drop into the left eye at bedtime.     lisinopril 10 MG tablet  Commonly known as:  PRINIVIL,ZESTRIL  TAKE 1 TABLET BY MOUTH DAILY     niacin 1000 MG CR tablet  Commonly known as:  NIASPAN  Take 1,000 mg by mouth at bedtime.     omeprazole 40 MG capsule  Commonly known as:  PRILOSEC  Take 40 mg by mouth daily.     prednisoLONE acetate 1 % ophthalmic suspension  Commonly known as:  PRED FORTE  Place 1 drop into the right eye at bedtime.     timolol 0.5 % ophthalmic solution  Commonly known as:  TIMOPTIC  Place 1 drop into both eyes 2 (two) times daily.     traMADol-acetaminophen 37.5-325 MG per tablet  Commonly known as:  ULTRACET  TAKE 1  TABLET BY MOUTH EVERY 6 HOURS AS NEEDED     ULORIC 40 MG tablet  Generic drug:  febuxostat  TAKE 1 TABLET BY MOUTH EVERY DAY     VICTOZA 18 MG/3ML Soln injection  Generic drug:  Liraglutide  Inject 1.8 mg into the skin every morning.     vitamin B-12 1000 MCG tablet  Commonly known as:  CYANOCOBALAMIN  Take 1,000 mcg by mouth daily.        Allergies:  Allergies  Allergen Reactions  . Penicillins Swelling and Rash    Past Medical History  Diagnosis Date  . Diabetes mellitus   . Hypertension   . Obesity   . Sleep apnea   . Gouty arthritis     Past Surgical History  Procedure Laterality Date  . Eye surgery    . Knee surgery      Family History  Problem Relation Age of Onset  . Hypertension Sister   . Diabetes Mellitus II Sister     Social History:  reports that he has never smoked. He does not have any smokeless tobacco history on file. He reports that he does not drink alcohol or use illicit drugs.  Review of Systems:  HYPERTENSION:  currently on Coreg  HYPERLIPIDEMIA: The lipid abnormality consists of elevated triglycerides.    He has history of neuropathy  No recent history of edema  He does have history of nephropathy with mild renal dysfunction   Examination:   BP 132/80  Pulse 78  Temp(Src) 98.8 F (37.1 C)  Resp 12  Ht 5\' 8"  (1.727 m)  Wt 400 lb 4.8 oz (181.575 kg)  BMI 60.88 kg/m2  SpO2 97%  Body mass index is 60.88 kg/(m^2).   ASSESSMENT/ PLAN::   Diabetes type 2   The patient's diabetes control is not as good recently with periodic high fasting readings He thinks this is from his irregular eating habits and inconsistent insulin doses from his religious fast Since his blood sugars are occasionally low at 3 AM will not increase his evening Lantus as yet He has resumed his normal diet and will benefit from resuming his previous doses of U-500 insulin which have improved his control He should benefit significantly from his impending  gastric bypass surgery because of  his morbid obesity  Discuss adjustment of both insulin doses, or glucose monitoring timings and targets, balanced meals and exercise  Domanik [place] 01/04/2013, 10:54 AM   Addendum:  Lab on 01/03/2013  Component Date Value Range Status  . Hemoglobin A1C 01/03/2013 8.0* 4.6 - 6.5 % Final   Glycemic Control Guidelines for People with Diabetes:Non Diabetic:  <6%Goal of Therapy: <7%Additional Action Suggested:  >8%   . Cholesterol 01/03/2013 90  0 - 200 mg/dL Final   ATP III Classification       Desirable:  < 200 mg/dL               Borderline High:  200 - 239 mg/dL          High:  > = 161 mg/dL  . Triglycerides 01/03/2013 191.0* 0.0 - 149.0 mg/dL Final   Normal:  <096 mg/dLBorderline High:  150 - 199 mg/dL  . HDL 01/03/2013 31.50* >39.00 mg/dL Final  . VLDL 04/54/0981 38.2  0.0 - 40.0 mg/dL Final  . LDL Cholesterol 01/03/2013 20  0 - 99 mg/dL Final  . Total CHOL/HDL Ratio 01/03/2013 3   Final                  Men          Women1/2 Average Risk     3.4          3.3Average Risk          5.0          4.42X Average Risk          9.6          7.13X Average Risk          15.0          11.0                      . Sodium 01/03/2013 140  135 - 145 mEq/L Final  . Potassium 01/03/2013 5.0  3.5 - 5.1 mEq/L Final  . Chloride 01/03/2013 106  96 - 112 mEq/L Final  . CO2 01/03/2013 25  19 - 32 mEq/L Final  . Glucose, Bld 01/03/2013 144* 70 - 99 mg/dL Final  . BUN 19/14/7829 49* 6 - 23 mg/dL Final  . Creatinine, Ser 01/03/2013 1.8* 0.4 - 1.5 mg/dL Final  . Total Bilirubin 01/03/2013 0.3  0.3 - 1.2 mg/dL Final  . Alkaline Phosphatase 01/03/2013 36* 39 - 117 U/L Final  . AST 01/03/2013 16  0 - 37 U/L Final  . ALT 01/03/2013 18  0 - 53 U/L Final  . Total Protein 01/03/2013 6.3  6.0 - 8.3 g/dL Final  . Albumin 56/21/3086 3.3* 3.5 - 5.2 g/dL Final  . Calcium 57/84/6962 8.8  8.4 - 10.5 mg/dL Final  . GFR 95/28/4132 42.35* >60.00 mL/min Final  . Microalb, Ur 01/03/2013 83.1* 0.0  - 1.9 mg/dL Final  . Creatinine,U 44/06/270 71.0   Final  . Microalb Creat Ratio 01/03/2013 117.1* 0.0 - 30.0 mg/g Final  . Color, Urine 01/03/2013 LT. YELLOW  Yellow;Lt. Yellow Final  . APPearance 01/03/2013 CLEAR  Clear Final  . Specific Gravity, Urine 01/03/2013 1.020  1.000-1.030 Final  . pH 01/03/2013 6.0  5.0 - 8.0 Final  . Total Protein, Urine 01/03/2013 100  Negative Final  . Urine Glucose 01/03/2013 100  Negative Final  . Ketones, ur 01/03/2013 NEGATIVE  Negative Final  . Bilirubin Urine 01/03/2013 NEGATIVE  Negative Final  . Hgb urine dipstick  01/03/2013 TRACE-INTACT  Negative Final  . Urobilinogen, UA 01/03/2013 0.2  0.0 - 1.0 Final  . Leukocytes, UA 01/03/2013 NEGATIVE  Negative Final  . Nitrite 01/03/2013 NEGATIVE  Negative Final  . WBC, UA 01/03/2013 0-2/hpf  0-2/hpf Final  . RBC / HPF 01/03/2013 0-2/hpf  0-2/hpf Final  . Squamous Epithelial / LPF 01/03/2013 Rare(0-4/hpf)  Rare(0-4/hpf) Final

## 2013-01-05 ENCOUNTER — Other Ambulatory Visit: Payer: Self-pay | Admitting: *Deleted

## 2013-01-05 DIAGNOSIS — E119 Type 2 diabetes mellitus without complications: Secondary | ICD-10-CM | POA: Insufficient documentation

## 2013-01-05 MED ORDER — "INSULIN SYRINGE-NEEDLE U-100 30G X 1/2"" 0.3 ML MISC": Status: DC

## 2013-02-01 ENCOUNTER — Ambulatory Visit: Payer: Medicaid Other | Admitting: Family Medicine

## 2013-02-09 ENCOUNTER — Ambulatory Visit: Payer: Medicaid Other | Admitting: Endocrinology

## 2013-02-17 ENCOUNTER — Ambulatory Visit (INDEPENDENT_AMBULATORY_CARE_PROVIDER_SITE_OTHER): Payer: Medicaid Other | Admitting: Family Medicine

## 2013-02-17 ENCOUNTER — Encounter: Payer: Self-pay | Admitting: Family Medicine

## 2013-02-17 VITALS — BP 100/60 | HR 72 | Temp 98.0°F | Resp 20 | Wt 390.0 lb

## 2013-02-17 DIAGNOSIS — Z23 Encounter for immunization: Secondary | ICD-10-CM

## 2013-02-17 DIAGNOSIS — G609 Hereditary and idiopathic neuropathy, unspecified: Secondary | ICD-10-CM

## 2013-02-17 MED ORDER — GABAPENTIN 300 MG PO CAPS: 600.0000 mg | ORAL_CAPSULE | Freq: Three times a day (TID) | ORAL | Status: DC

## 2013-02-17 NOTE — Progress Notes (Signed)
Subjective:    Patient ID: Alexander Hubbard, male    DOB: May 04, 1956, 57 y.o.   MRN: [id number]  HPI Patient scheduled for gastric bypass surgery next month. He comes in complaining of pain in his right leg. The pain is located lateral to the tibia from the mid shin to the level of the ankle. The pain is characterized as a burning or stinging pain. He states that it feels like a cut under the skin. He also complains of burning dysesthesias in both feet. He has a history of diabetes mellitus type 2 and peripheral neuropathy.  He is currently taking gabapentin 300 mg by mouth 3 times a day for peripheral neuropathy.  He denies any injury to the chin. He did not fall the shin. There is no bone pain. There is no visible lesion on the shin. He does have chronic venous insufficiency and peripheral edema and there is venous stasis dermatitis in the skin but this is chronic. Past Medical History  Diagnosis Date  . Diabetes mellitus   . Hypertension   . Obesity   . Sleep apnea   . Gouty arthritis    Past Surgical History  Procedure Laterality Date  . Eye surgery    . Knee surgery     Current Outpatient Prescriptions on File Prior to Visit  Medication Sig Dispense Refill  . aspirin 325 MG tablet Take 325 mg by mouth daily.      . brinzolamide (AZOPT) 1 % ophthalmic suspension Place 1 drop into both eyes 2 (two) times daily.        . carvedilol (COREG) 12.5 MG tablet TAKE 1 TABLET BY MOUTH TWICE A DAY  60 tablet  5  . COLCRYS 0.6 MG tablet TAKE 1 TABLET BY MOUTH EVERY DAY  30 tablet  2  . CRESTOR 20 MG tablet TAKE 1 TABLET BY MOUTH DAILY AT BEDTIME  30 tablet  2  . furosemide (LASIX) 40 MG tablet TAKE 1 TABLET BY MOUTH TWICE A DAY  60 tablet  3  . insulin glargine (LANTUS) 100 UNIT/ML injection Inject 45-56 Units into the skin 2 (two) times daily. 45 units QAM    65 units QPM      . insulin regular human CONCENTRATED (HUMULIN R) 500 UNIT/ML SOLN injection Inject 6-10 Units into the skin 3 (three) times  daily with meals. Inject 8 units in AM, 6 units at Lunch, and 10 units at dinner.      . Insulin Syringe-Needle U-100 (B-D INS SYR ULTRAFINE .3CC/30G) 30G X 1/2" 0.3 ML MISC USE AS DIRECTED 3 TIMES A DAY. DX CODE: 250.00  100 each  3  . latanoprost (XALATAN) 0.005 % ophthalmic solution Place 1 drop into the left eye at bedtime.       . Liraglutide (VICTOZA) 18 MG/3ML SOLN Inject 1.8 mg into the skin every morning.       Marland Kitchen lisinopril (PRINIVIL,ZESTRIL) 10 MG tablet TAKE 1 TABLET BY MOUTH DAILY  30 tablet  2  . niacin (NIASPAN) 1000 MG CR tablet Take 1,000 mg by mouth at bedtime.      Marland Kitchen omeprazole (PRILOSEC) 40 MG capsule Take 40 mg by mouth daily.        . prednisoLONE acetate (PRED FORTE) 1 % ophthalmic suspension Place 1 drop into the right eye at bedtime.       . timolol (TIMOPTIC) 0.5 % ophthalmic solution Place 1 drop into both eyes 2 (two) times daily.      . traMADol-acetaminophen (  ULTRACET) 37.5-325 MG per tablet TAKE 1 TABLET BY MOUTH EVERY 6 HOURS AS NEEDED  30 tablet  0  . ULORIC 40 MG tablet TAKE 1 TABLET BY MOUTH EVERY DAY  30 tablet  2  . vitamin B-12 (CYANOCOBALAMIN) 1000 MCG tablet Take 1,000 mcg by mouth daily.         No current facility-administered medications on file prior to visit.   Allergies  Allergen Reactions  . Penicillins Swelling and Rash   History   Social History  . Marital Status: Unknown    Spouse Name: N/A    Number of Children: N/A  . Years of Education: N/A   Occupational History  . Not on file.   Social History Main Topics  . Smoking status: Never Smoker   . Smokeless tobacco: Not on file  . Alcohol Use: No  . Drug Use: No  . Sexual Activity: Not on file   Other Topics Concern  . Not on file   Social History Narrative  . No narrative on file        Review of Systems  All other systems reviewed and are negative.       Objective:   Physical Exam  Vitals reviewed. Constitutional: He appears well-developed and well-nourished.   Cardiovascular: Normal rate and regular rhythm.   Pulmonary/Chest: Effort normal and breath sounds normal.  Abdominal: Soft. Bowel sounds are normal.  Musculoskeletal:       Right knee: Normal.       Right ankle: Normal.       Right lower leg: He exhibits edema. He exhibits no bony tenderness, no swelling, no deformity and no laceration.  Skin: Skin is warm. Rash noted. No erythema. No pallor.   the skin is extremely sensitive to palpation over the lateral mid shin.        Assessment & Plan:  1. Need for prophylactic vaccination and inoculation against influenza - Flu Vaccine QUAD 36+ mos IM  2. Unspecified hereditary and idiopathic peripheral neuropathy I believe the patient's pain is neuropathic in nature. I doubt a skeletal or muscle injury would cause this pain pattern.  Try increasing gabapentin to 600 mg by mouth 3 times a day. Consider lumbar radiculopathy if pain continues. Consider getting an x-ray of the tibia if pain continues. Recheck if no better in one month.

## 2013-03-01 ENCOUNTER — Encounter (HOSPITAL_BASED_OUTPATIENT_CLINIC_OR_DEPARTMENT_OTHER): Payer: Medicaid Other | Attending: General Surgery

## 2013-03-01 DIAGNOSIS — L97909 Non-pressure chronic ulcer of unspecified part of unspecified lower leg with unspecified severity: Secondary | ICD-10-CM | POA: Insufficient documentation

## 2013-03-01 DIAGNOSIS — I87319 Chronic venous hypertension (idiopathic) with ulcer of unspecified lower extremity: Secondary | ICD-10-CM | POA: Insufficient documentation

## 2013-03-01 DIAGNOSIS — I890 Lymphedema, not elsewhere classified: Secondary | ICD-10-CM | POA: Insufficient documentation

## 2013-03-02 NOTE — Progress Notes (Signed)
[facility]  NAME:  KALUB, MORILLO NO.:  [id number]  MEDICAL RECORD NO.:  [id number]      DATE OF BIRTH:  1956-04-26  PHYSICIAN:  Joanne Gavel, M.D.         VISIT DATE:  03/01/2013                                  OFFICE VISIT   To Whom It May Concern:  We have been treating Mr. Steinmeyer for approximately 8 months.  He has a recurrent ulcerations with a chronic venous hypertension, marked stasis changes, and stage I to II lymphedema.  He has almost continuous drainage and after treatment with Unna boots multiple times, sometimes healing, he returns very rapidly with recurrence.  We believe that lymphedema pumping would be very helpful in his case.     Joanne Gavel, M.D.     RA/MEDQ  D:  03/01/2013  T:  03/02/2013  Job:  [id number]

## 2013-03-03 ENCOUNTER — Other Ambulatory Visit: Payer: Self-pay | Admitting: Family Medicine

## 2013-03-07 ENCOUNTER — Other Ambulatory Visit: Payer: Self-pay | Admitting: *Deleted

## 2013-03-07 ENCOUNTER — Ambulatory Visit (INDEPENDENT_AMBULATORY_CARE_PROVIDER_SITE_OTHER): Payer: Medicaid Other | Admitting: Family Medicine

## 2013-03-07 ENCOUNTER — Encounter: Payer: Self-pay | Admitting: Family Medicine

## 2013-03-07 VITALS — BP 130/78 | HR 80 | Temp 98.2°F | Resp 22

## 2013-03-07 DIAGNOSIS — M7989 Other specified soft tissue disorders: Secondary | ICD-10-CM

## 2013-03-07 DIAGNOSIS — R1011 Right upper quadrant pain: Secondary | ICD-10-CM

## 2013-03-07 DIAGNOSIS — R1012 Left upper quadrant pain: Secondary | ICD-10-CM

## 2013-03-07 MED ORDER — PANTOPRAZOLE SODIUM 40 MG PO TBEC: 40.0000 mg | DELAYED_RELEASE_TABLET | Freq: Two times a day (BID) | ORAL | Status: DC

## 2013-03-07 NOTE — Progress Notes (Signed)
Subjective:    Patient ID: Alexander Hubbard, male    DOB: June 17, 1955, 57 y.o.   MRN: [id number]  HPI  Patient is a morbidly obese 56 year old male who comes in with one week of left upper quadrant and right upper quadrant abdominal pain. He denies any injury. He is extremely tender to palpation in the left upper quadrant. The pain radiates to the left upper quadrant into his back. He is also tender to palpation in the right upper quadrant which also radiates into his back. The pain is made worse by movement as well as after eating. The pain is worse by palpation. He denies any nausea or vomiting. He denies any melena or hematochezia. He denies any fever. The pain is constant and sharp and burning.  There are no alleviating factors. He denies any angina. He denies any exacerbation with activity. He denies any shortness of breath. Past Medical History  Diagnosis Date  . Diabetes mellitus   . Hypertension   . Obesity   . Sleep apnea   . Gouty arthritis    Past Surgical History  Procedure Laterality Date  . Eye surgery    . Knee surgery     Current Outpatient Prescriptions on File Prior to Visit  Medication Sig Dispense Refill  . aspirin 325 MG tablet Take 325 mg by mouth daily.      . brinzolamide (AZOPT) 1 % ophthalmic suspension Place 1 drop into both eyes 2 (two) times daily.        . carvedilol (COREG) 12.5 MG tablet TAKE 1 TABLET BY MOUTH TWICE A DAY  60 tablet  5  . COLCRYS 0.6 MG tablet TAKE 1 TABLET BY MOUTH EVERY DAY  30 tablet  5  . CRESTOR 20 MG tablet TAKE 1 TABLET BY MOUTH DAILY AT BEDTIME  30 tablet  2  . furosemide (LASIX) 40 MG tablet TAKE 1 TABLET BY MOUTH TWICE A DAY  60 tablet  3  . gabapentin (NEURONTIN) 300 MG capsule Take 2 capsules (600 mg total) by mouth 3 (three) times daily.  180 capsule  3  . insulin glargine (LANTUS) 100 UNIT/ML injection Inject 45-56 Units into the skin 2 (two) times daily. 45 units QAM    65 units QPM      . insulin regular human CONCENTRATED  (HUMULIN R) 500 UNIT/ML SOLN injection Inject 6-10 Units into the skin 3 (three) times daily with meals. Inject 8 units in AM, 6 units at Lunch, and 10 units at dinner.      . Insulin Syringe-Needle U-100 (B-D INS SYR ULTRAFINE .3CC/30G) 30G X 1/2" 0.3 ML MISC USE AS DIRECTED 3 TIMES A DAY. DX CODE: 250.00  100 each  3  . latanoprost (XALATAN) 0.005 % ophthalmic solution Place 1 drop into the left eye at bedtime.       . Liraglutide (VICTOZA) 18 MG/3ML SOLN Inject 1.8 mg into the skin every morning.       Marland Kitchen lisinopril (PRINIVIL,ZESTRIL) 10 MG tablet TAKE 1 TABLET BY MOUTH DAILY  30 tablet  2  . niacin (NIASPAN) 1000 MG CR tablet Take 1,000 mg by mouth at bedtime.      Marland Kitchen omeprazole (PRILOSEC) 40 MG capsule TAKE 1 CAPSULE BY MOUTH DAILY  30 capsule  11  . prednisoLONE acetate (PRED FORTE) 1 % ophthalmic suspension Place 1 drop into the right eye at bedtime.       . timolol (TIMOPTIC) 0.5 % ophthalmic solution Place 1 drop into both eyes 2 (  two) times daily.      . traMADol-acetaminophen (ULTRACET) 37.5-325 MG per tablet TAKE 1 TABLET BY MOUTH EVERY 6 HOURS AS NEEDED  30 tablet  0  . ULORIC 40 MG tablet TAKE 1 TABLET BY MOUTH EVERY DAY  30 tablet  5  . vitamin B-12 (CYANOCOBALAMIN) 1000 MCG tablet Take 1,000 mcg by mouth daily.         No current facility-administered medications on file prior to visit.   Allergies  Allergen Reactions  . Penicillins Swelling and Rash   History   Social History  . Marital Status: Unknown    Spouse Name: N/A    Number of Children: N/A  . Years of Education: N/A   Occupational History  . Not on file.   Social History Main Topics  . Smoking status: Never Smoker   . Smokeless tobacco: Not on file  . Alcohol Use: No  . Drug Use: No  . Sexual Activity: Not on file   Other Topics Concern  . Not on file   Social History Narrative  . No narrative on file     Review of Systems  All other systems reviewed and are negative.       Objective:    Physical Exam  Vitals reviewed. Constitutional: He appears well-developed and well-nourished.  HENT:  Head: Normocephalic.  Right Ear: External ear normal.  Left Ear: External ear normal.  Nose: Nose normal.  Mouth/Throat: Oropharynx is clear and moist. No oropharyngeal exudate.  Eyes: Conjunctivae are normal. Pupils are equal, round, and reactive to light. No scleral icterus.  Neck: Normal range of motion. Neck supple. No JVD present. No thyromegaly present.  Cardiovascular: Normal rate, regular rhythm and normal heart sounds.  Exam reveals no gallop and no friction rub.   No murmur heard. Pulmonary/Chest: Effort normal and breath sounds normal. No respiratory distress. He has no wheezes. He has no rales. He exhibits no tenderness.  Abdominal: Soft. Bowel sounds are normal. He exhibits no distension and no mass. There is tenderness (Tender outpatient in the epigastric area and left upper quadrant more than the right upper quadrant). There is no rebound and no guarding.  Lymphadenopathy:    He has no cervical adenopathy.  Skin: Skin is warm. No rash noted. No erythema. No pallor.          Assessment & Plan:  1. Abdominal pain, left upper quadrant - COMPLETE METABOLIC PANEL WITH GFR - CBC with Differential - Lipase - pantoprazole (PROTONIX) 40 MG tablet; Take 1 tablet (40 mg total) by mouth 2 (two) times daily.  Dispense: 60 tablet; Refill: 3 - CT Abdomen Pelvis Wo Contrast; Future  2. Abdominal pain, right upper quadrant - COMPLETE METABOLIC PANEL WITH GFR - CBC with Differential - Lipase - pantoprazole (PROTONIX) 40 MG tablet; Take 1 tablet (40 mg total) by mouth 2 (two) times daily.  Dispense: 60 tablet; Refill: 3 - CT Abdomen Pelvis Wo Contrast; Future  I believe the patient may have peptic ulcer disease. Begin protonix 40 mg by mouth twice a day. The possibilities on the differential include pancreatitis versus biliary tract pathology. Our check a lipase, CBC, CMP, and a CT  scan of the abdomen to evaluate the pancreas. The patient is instructed to go the emergency room if worse

## 2013-03-08 ENCOUNTER — Encounter (HOSPITAL_BASED_OUTPATIENT_CLINIC_OR_DEPARTMENT_OTHER): Payer: Medicaid Other | Attending: General Surgery

## 2013-03-08 DIAGNOSIS — I87319 Chronic venous hypertension (idiopathic) with ulcer of unspecified lower extremity: Secondary | ICD-10-CM | POA: Insufficient documentation

## 2013-03-08 DIAGNOSIS — L97909 Non-pressure chronic ulcer of unspecified part of unspecified lower leg with unspecified severity: Secondary | ICD-10-CM | POA: Insufficient documentation

## 2013-03-08 LAB — CBC WITH DIFFERENTIAL/PLATELET
Basophils Absolute: 0.1 10*3/uL (ref 0.0–0.1)
Lymphocytes Relative: 33 % (ref 12–46)
Lymphs Abs: 3.1 10*3/uL (ref 0.7–4.0)
Neutro Abs: 5.4 10*3/uL (ref 1.7–7.7)
Neutrophils Relative %: 56 % (ref 43–77)
Platelets: 200 10*3/uL (ref 150–400)
RBC: 4.14 MIL/uL — ABNORMAL LOW (ref 4.22–5.81)
RDW: 15.5 % (ref 11.5–15.5)
WBC: 9.4 10*3/uL (ref 4.0–10.5)

## 2013-03-08 LAB — COMPLETE METABOLIC PANEL WITH GFR
Albumin: 4 g/dL (ref 3.5–5.2)
BUN: 55 mg/dL — ABNORMAL HIGH (ref 6–23)
Calcium: 9.4 mg/dL (ref 8.4–10.5)
Chloride: 103 mEq/L (ref 96–112)
Creat: 1.93 mg/dL — ABNORMAL HIGH (ref 0.50–1.35)
GFR, Est Non African American: 38 mL/min — ABNORMAL LOW
Glucose, Bld: 108 mg/dL — ABNORMAL HIGH (ref 70–99)
Potassium: 5.2 mEq/L (ref 3.5–5.3)

## 2013-03-08 LAB — LIPASE: Lipase: 18 U/L (ref 0–75)

## 2013-03-12 ENCOUNTER — Other Ambulatory Visit: Payer: Self-pay | Admitting: Family Medicine

## 2013-03-12 DIAGNOSIS — R1011 Right upper quadrant pain: Secondary | ICD-10-CM

## 2013-03-21 ENCOUNTER — Other Ambulatory Visit: Payer: Medicaid Other

## 2013-03-21 ENCOUNTER — Ambulatory Visit
Admission: RE | Admit: 2013-03-21 | Discharge: 2013-03-21 | Disposition: A | Discharge status: Home / Self Care | Payer: Medicaid Other | Source: Ambulatory Visit | Attending: Family Medicine | Admitting: Family Medicine

## 2013-03-21 ENCOUNTER — Telehealth: Payer: Self-pay | Admitting: Family Medicine

## 2013-03-21 DIAGNOSIS — R1011 Right upper quadrant pain: Secondary | ICD-10-CM

## 2013-03-21 NOTE — Telephone Encounter (Signed)
Patient calling for results.

## 2013-03-22 DIAGNOSIS — I87319 Chronic venous hypertension (idiopathic) with ulcer of unspecified lower extremity: Secondary | ICD-10-CM | POA: Diagnosis not present

## 2013-03-28 ENCOUNTER — Telehealth: Payer: Self-pay | Admitting: Family Medicine

## 2013-03-28 NOTE — Telephone Encounter (Signed)
Patient called to let us know that he is still having diarrhea . Everything he eats or drinks goes through him. No. Fever. NIT. Headaches. Patient has been drinking plenty of fluids. Offered to make and appointment ,patient said he can't leave the house because he can't stay out of the bathroom. Has been taking OTC medicine to see if that would help . But, he is still having diarrhea.

## 2013-03-29 ENCOUNTER — Inpatient Hospital Stay (HOSPITAL_COMMUNITY)
Admission: EM | Admit: 2013-03-29 | Discharge: 2013-04-03 | DRG: 871 | Disposition: A | Discharge status: Home / Self Care | Payer: Medicaid Other | Attending: Internal Medicine | Admitting: Internal Medicine

## 2013-03-29 ENCOUNTER — Encounter (HOSPITAL_COMMUNITY): Payer: Self-pay | Admitting: Emergency Medicine

## 2013-03-29 ENCOUNTER — Telehealth: Payer: Self-pay | Admitting: Family Medicine

## 2013-03-29 DIAGNOSIS — E875 Hyperkalemia: Secondary | ICD-10-CM | POA: Diagnosis present

## 2013-03-29 DIAGNOSIS — E662 Morbid (severe) obesity with alveolar hypoventilation: Secondary | ICD-10-CM | POA: Diagnosis present

## 2013-03-29 DIAGNOSIS — I10 Essential (primary) hypertension: Secondary | ICD-10-CM

## 2013-03-29 DIAGNOSIS — G9341 Metabolic encephalopathy: Secondary | ICD-10-CM | POA: Diagnosis present

## 2013-03-29 DIAGNOSIS — G4733 Obstructive sleep apnea (adult) (pediatric): Secondary | ICD-10-CM | POA: Diagnosis present

## 2013-03-29 DIAGNOSIS — A419 Sepsis, unspecified organism: Principal | ICD-10-CM | POA: Diagnosis present

## 2013-03-29 DIAGNOSIS — Z6841 Body Mass Index (BMI) 40.0 and over, adult: Secondary | ICD-10-CM

## 2013-03-29 DIAGNOSIS — Z794 Long term (current) use of insulin: Secondary | ICD-10-CM

## 2013-03-29 DIAGNOSIS — I509 Heart failure, unspecified: Secondary | ICD-10-CM | POA: Diagnosis present

## 2013-03-29 DIAGNOSIS — I129 Hypertensive chronic kidney disease with stage 1 through stage 4 chronic kidney disease, or unspecified chronic kidney disease: Secondary | ICD-10-CM | POA: Diagnosis present

## 2013-03-29 DIAGNOSIS — E860 Dehydration: Secondary | ICD-10-CM | POA: Diagnosis present

## 2013-03-29 DIAGNOSIS — E119 Type 2 diabetes mellitus without complications: Secondary | ICD-10-CM | POA: Diagnosis present

## 2013-03-29 DIAGNOSIS — E872 Acidosis, unspecified: Secondary | ICD-10-CM | POA: Diagnosis present

## 2013-03-29 DIAGNOSIS — R197 Diarrhea, unspecified: Secondary | ICD-10-CM | POA: Diagnosis present

## 2013-03-29 DIAGNOSIS — N182 Chronic kidney disease, stage 2 (mild): Secondary | ICD-10-CM | POA: Diagnosis present

## 2013-03-29 DIAGNOSIS — IMO0001 Reserved for inherently not codable concepts without codable children: Secondary | ICD-10-CM

## 2013-03-29 DIAGNOSIS — R6510 Systemic inflammatory response syndrome (SIRS) of non-infectious origin without acute organ dysfunction: Secondary | ICD-10-CM

## 2013-03-29 DIAGNOSIS — N179 Acute kidney failure, unspecified: Secondary | ICD-10-CM

## 2013-03-29 DIAGNOSIS — N170 Acute kidney failure with tubular necrosis: Secondary | ICD-10-CM | POA: Diagnosis present

## 2013-03-29 DIAGNOSIS — E785 Hyperlipidemia, unspecified: Secondary | ICD-10-CM | POA: Diagnosis present

## 2013-03-29 DIAGNOSIS — I5032 Chronic diastolic (congestive) heart failure: Secondary | ICD-10-CM | POA: Diagnosis present

## 2013-03-29 DIAGNOSIS — R0902 Hypoxemia: Secondary | ICD-10-CM

## 2013-03-29 DIAGNOSIS — H409 Unspecified glaucoma: Secondary | ICD-10-CM | POA: Diagnosis present

## 2013-03-29 DIAGNOSIS — N184 Chronic kidney disease, stage 4 (severe): Secondary | ICD-10-CM | POA: Diagnosis present

## 2013-03-29 DIAGNOSIS — E874 Mixed disorder of acid-base balance: Secondary | ICD-10-CM | POA: Diagnosis present

## 2013-03-29 DIAGNOSIS — D649 Anemia, unspecified: Secondary | ICD-10-CM | POA: Diagnosis present

## 2013-03-29 DIAGNOSIS — M109 Gout, unspecified: Secondary | ICD-10-CM | POA: Diagnosis present

## 2013-03-29 LAB — CG4 I-STAT (LACTIC ACID): Lactic Acid, Venous: 1.74 mmol/L (ref 0.5–2.2)

## 2013-03-29 NOTE — Telephone Encounter (Signed)
ntbs for stool culture and op

## 2013-03-29 NOTE — Telephone Encounter (Signed)
Please call -still having diarrhea

## 2013-03-29 NOTE — ED Notes (Signed)
Presents with 5 days of diarrhea, nausea. vomiting began today.  Diarrhea 15-20 times per day, unable to keep anything down. Denies black/tarry stools or bloody stool.  Pale and weak. bp 80/60. Endorses headache and dizzines with change of position.

## 2013-03-29 NOTE — Telephone Encounter (Signed)
Pt was told to take immodium twice daily until resolved

## 2013-03-29 NOTE — Telephone Encounter (Signed)
Daughter came by office.  Gave specimen containers to collect stool culture and O&P.

## 2013-03-30 ENCOUNTER — Inpatient Hospital Stay (HOSPITAL_COMMUNITY): Payer: Medicaid Other

## 2013-03-30 ENCOUNTER — Encounter (HOSPITAL_COMMUNITY): Payer: Self-pay | Admitting: Radiology

## 2013-03-30 ENCOUNTER — Emergency Department (HOSPITAL_COMMUNITY): Payer: Medicaid Other

## 2013-03-30 DIAGNOSIS — R6510 Systemic inflammatory response syndrome (SIRS) of non-infectious origin without acute organ dysfunction: Secondary | ICD-10-CM | POA: Diagnosis present

## 2013-03-30 DIAGNOSIS — N179 Acute kidney failure, unspecified: Secondary | ICD-10-CM

## 2013-03-30 DIAGNOSIS — E872 Acidosis: Secondary | ICD-10-CM | POA: Diagnosis present

## 2013-03-30 DIAGNOSIS — N184 Chronic kidney disease, stage 4 (severe): Secondary | ICD-10-CM | POA: Diagnosis present

## 2013-03-30 DIAGNOSIS — N189 Chronic kidney disease, unspecified: Secondary | ICD-10-CM

## 2013-03-30 DIAGNOSIS — R197 Diarrhea, unspecified: Secondary | ICD-10-CM | POA: Diagnosis present

## 2013-03-30 LAB — URINE MICROSCOPIC-ADD ON

## 2013-03-30 LAB — BASIC METABOLIC PANEL
BUN: 87 mg/dL — ABNORMAL HIGH (ref 6–23)
CO2: 14 mEq/L — ABNORMAL LOW (ref 19–32)
Calcium: 7.8 mg/dL — ABNORMAL LOW (ref 8.4–10.5)
Chloride: 105 mEq/L (ref 96–112)
Creatinine, Ser: 3.97 mg/dL — ABNORMAL HIGH (ref 0.50–1.35)
Glucose, Bld: 162 mg/dL — ABNORMAL HIGH (ref 70–99)
Potassium: 6.1 mEq/L — ABNORMAL HIGH (ref 3.5–5.1)
Sodium: 133 mEq/L — ABNORMAL LOW (ref 135–145)

## 2013-03-30 LAB — URINALYSIS, ROUTINE W REFLEX MICROSCOPIC
Bilirubin Urine: NEGATIVE
Ketones, ur: NEGATIVE mg/dL
Leukocytes, UA: NEGATIVE
Nitrite: NEGATIVE
Protein, ur: 100 mg/dL — AB
Urobilinogen, UA: 0.2 mg/dL (ref 0.0–1.0)

## 2013-03-30 LAB — GLUCOSE, CAPILLARY: Glucose-Capillary: 139 mg/dL — ABNORMAL HIGH (ref 70–99)

## 2013-03-30 LAB — CBC WITH DIFFERENTIAL/PLATELET
Basophils Absolute: 0 10*3/uL (ref 0.0–0.1)
Basophils Relative: 0 % (ref 0–1)
Eosinophils Relative: 3 % (ref 0–5)
HCT: 28 % — ABNORMAL LOW (ref 39.0–52.0)
Hemoglobin: 9.5 g/dL — ABNORMAL LOW (ref 13.0–17.0)
Lymphs Abs: 2.8 10*3/uL (ref 0.7–4.0)
MCV: 81.2 fL (ref 78.0–100.0)
Monocytes Relative: 7 % (ref 3–12)
Neutro Abs: 7.8 10*3/uL — ABNORMAL HIGH (ref 1.7–7.7)
Platelets: 185 10*3/uL (ref 150–400)
RBC: 3.45 MIL/uL — ABNORMAL LOW (ref 4.22–5.81)
WBC: 11.8 10*3/uL — ABNORMAL HIGH (ref 4.0–10.5)

## 2013-03-30 LAB — IRON AND TIBC
Iron: 22 ug/dL — ABNORMAL LOW (ref 42–135)
Saturation Ratios: 8 % — ABNORMAL LOW (ref 20–55)
UIBC: 266 ug/dL (ref 125–400)

## 2013-03-30 LAB — COMPREHENSIVE METABOLIC PANEL
AST: 13 U/L (ref 0–37)
Albumin: 3.1 g/dL — ABNORMAL LOW (ref 3.5–5.2)
BUN: 89 mg/dL — ABNORMAL HIGH (ref 6–23)
Creatinine, Ser: 4.46 mg/dL — ABNORMAL HIGH (ref 0.50–1.35)
GFR calc Af Amer: 16 mL/min — ABNORMAL LOW (ref >90)
Glucose, Bld: 76 mg/dL (ref 70–99)
Total Bilirubin: 0.2 mg/dL — ABNORMAL LOW (ref 0.3–1.2)
Total Protein: 6.7 g/dL (ref 6.0–8.3)

## 2013-03-30 LAB — OSMOLALITY, URINE: Osmolality, Ur: 262 mOsm/kg — ABNORMAL LOW (ref 390–1090)

## 2013-03-30 LAB — POCT I-STAT 3, ART BLOOD GAS (G3+)
O2 Saturation: 96 %
Patient temperature: 99.7
pH, Arterial: 7.195 — CL (ref 7.350–7.450)

## 2013-03-30 LAB — URIC ACID: Uric Acid, Serum: 7.7 mg/dL (ref 4.0–7.8)

## 2013-03-30 LAB — SODIUM, URINE, RANDOM: Sodium, Ur: 25 mEq/L

## 2013-03-30 MED ORDER — GABAPENTIN 300 MG PO CAPS
600.0000 mg | ORAL_CAPSULE | Freq: Three times a day (TID) | ORAL | Status: DC
  Administered 2013-03-30 (×3): 600 mg via ORAL
  Filled 2013-03-30 (×6): qty 2

## 2013-03-30 MED ORDER — NIACIN ER 500 MG PO CPCR
1000.0000 mg | ORAL_CAPSULE | Freq: Every day | ORAL | Status: DC
  Administered 2013-03-30 – 2013-04-02 (×4): 1000 mg via ORAL
  Filled 2013-03-30 (×5): qty 2

## 2013-03-30 MED ORDER — BRINZOLAMIDE 1 % OP SUSP
1.0000 [drp] | Freq: Two times a day (BID) | OPHTHALMIC | Status: DC
  Administered 2013-03-30 – 2013-04-02 (×8): 1 [drp] via OPHTHALMIC
  Filled 2013-03-30: qty 10

## 2013-03-30 MED ORDER — ATORVASTATIN CALCIUM 40 MG PO TABS
40.0000 mg | ORAL_TABLET | Freq: Every day | ORAL | Status: DC
  Administered 2013-03-30 – 2013-04-02 (×4): 40 mg via ORAL
  Filled 2013-03-30 (×6): qty 1

## 2013-03-30 MED ORDER — INSULIN GLARGINE 100 UNIT/ML ~~LOC~~ SOLN
35.0000 [IU] | Freq: Two times a day (BID) | SUBCUTANEOUS | Status: DC
  Administered 2013-03-30 (×2): 35 [IU] via SUBCUTANEOUS
  Filled 2013-03-30 (×4): qty 0.35

## 2013-03-30 MED ORDER — SODIUM CHLORIDE 0.9 % IV BOLUS (SEPSIS)
1000.0000 mL | Freq: Once | INTRAVENOUS | Status: AC
  Administered 2013-03-30: 1000 mL via INTRAVENOUS

## 2013-03-30 MED ORDER — FEBUXOSTAT 40 MG PO TABS
80.0000 mg | ORAL_TABLET | Freq: Every day | ORAL | Status: DC
  Administered 2013-03-30 – 2013-04-03 (×5): 80 mg via ORAL
  Filled 2013-03-30 (×5): qty 2

## 2013-03-30 MED ORDER — ONDANSETRON HCL 4 MG PO TABS: 4.0000 mg | ORAL_TABLET | Freq: Four times a day (QID) | ORAL | Status: DC | PRN

## 2013-03-30 MED ORDER — ONDANSETRON HCL 4 MG/2ML IJ SOLN
4.0000 mg | Freq: Four times a day (QID) | INTRAMUSCULAR | Status: DC | PRN
  Administered 2013-04-02: 05:00:00 4 mg via INTRAVENOUS
  Filled 2013-03-30: qty 2

## 2013-03-30 MED ORDER — ONDANSETRON HCL 4 MG/2ML IJ SOLN
4.0000 mg | Freq: Once | INTRAMUSCULAR | Status: AC
  Administered 2013-03-30: 4 mg via INTRAVENOUS
  Filled 2013-03-30: qty 2

## 2013-03-30 MED ORDER — LATANOPROST 0.005 % OP SOLN
1.0000 [drp] | Freq: Every day | OPHTHALMIC | Status: DC
  Administered 2013-03-30 – 2013-04-02 (×4): 1 [drp] via OPHTHALMIC
  Filled 2013-03-30: qty 2.5

## 2013-03-30 MED ORDER — SODIUM CHLORIDE 0.9 % IV SOLN
INTRAVENOUS | Status: DC
  Administered 2013-03-30 – 2013-03-31 (×2): via INTRAVENOUS

## 2013-03-30 MED ORDER — ASPIRIN 325 MG PO TABS
325.0000 mg | ORAL_TABLET | Freq: Every day | ORAL | Status: DC
  Administered 2013-03-30 – 2013-03-31 (×2): 325 mg via ORAL
  Filled 2013-03-30 (×4): qty 1

## 2013-03-30 MED ORDER — LOPERAMIDE HCL 2 MG PO CAPS
2.0000 mg | ORAL_CAPSULE | ORAL | Status: DC | PRN
  Administered 2013-03-30: 2 mg via ORAL
  Filled 2013-03-30 (×2): qty 1

## 2013-03-30 MED ORDER — NIACIN ER (ANTIHYPERLIPIDEMIC) 1000 MG PO TBCR: 1000.0000 mg | EXTENDED_RELEASE_TABLET | Freq: Every day | ORAL | Status: DC

## 2013-03-30 MED ORDER — SODIUM CHLORIDE 0.9 % IJ SOLN
3.0000 mL | Freq: Two times a day (BID) | INTRAMUSCULAR | Status: DC
  Administered 2013-03-30 – 2013-04-02 (×6): 3 mL via INTRAVENOUS

## 2013-03-30 MED ORDER — INSULIN ASPART 100 UNIT/ML ~~LOC~~ SOLN
0.0000 [IU] | Freq: Three times a day (TID) | SUBCUTANEOUS | Status: DC
  Administered 2013-03-30 – 2013-03-31 (×2): 2 [IU] via SUBCUTANEOUS
  Administered 2013-04-01 (×3): 3 [IU] via SUBCUTANEOUS
  Administered 2013-04-02 (×3): 5 [IU] via SUBCUTANEOUS
  Administered 2013-04-03: 07:00:00 3 [IU] via SUBCUTANEOUS

## 2013-03-30 MED ORDER — PANTOPRAZOLE SODIUM 40 MG PO TBEC
40.0000 mg | DELAYED_RELEASE_TABLET | Freq: Two times a day (BID) | ORAL | Status: DC
  Administered 2013-03-30 – 2013-04-03 (×9): 40 mg via ORAL
  Filled 2013-03-30 (×7): qty 1

## 2013-03-30 MED ORDER — PREDNISOLONE ACETATE 1 % OP SUSP
1.0000 [drp] | Freq: Every day | OPHTHALMIC | Status: DC
  Administered 2013-03-30 – 2013-04-02 (×4): 1 [drp] via OPHTHALMIC
  Filled 2013-03-30: qty 1

## 2013-03-30 MED ORDER — HEPARIN SODIUM (PORCINE) 5000 UNIT/ML IJ SOLN
5000.0000 [IU] | Freq: Three times a day (TID) | INTRAMUSCULAR | Status: DC
  Administered 2013-03-30 – 2013-04-03 (×12): 5000 [IU] via SUBCUTANEOUS
  Filled 2013-03-30 (×15): qty 1

## 2013-03-30 MED ORDER — TIMOLOL MALEATE 0.5 % OP SOLN
1.0000 [drp] | Freq: Two times a day (BID) | OPHTHALMIC | Status: DC
  Administered 2013-03-30 – 2013-04-02 (×8): 1 [drp] via OPHTHALMIC
  Filled 2013-03-30: qty 5

## 2013-03-30 MED ORDER — VITAMIN B-12 1000 MCG PO TABS
1000.0000 ug | ORAL_TABLET | Freq: Every day | ORAL | Status: DC
  Administered 2013-03-30 – 2013-04-02 (×4): 1000 ug via ORAL
  Filled 2013-03-30 (×5): qty 1

## 2013-03-30 MED ORDER — IOHEXOL 300 MG/ML  SOLN
25.0000 mL | INTRAMUSCULAR | Status: AC
  Administered 2013-03-30: 25 mL via ORAL

## 2013-03-30 MED ORDER — ACETAMINOPHEN 650 MG RE SUPP: 650.0000 mg | Freq: Four times a day (QID) | RECTAL | Status: DC | PRN

## 2013-03-30 MED ORDER — ACETAMINOPHEN 325 MG PO TABS
650.0000 mg | ORAL_TABLET | Freq: Four times a day (QID) | ORAL | Status: DC | PRN
  Administered 2013-03-30 – 2013-04-03 (×7): 650 mg via ORAL
  Filled 2013-03-30 (×7): qty 2

## 2013-03-30 NOTE — ED Notes (Signed)
Per Greig Castilla RN Dr. Allena Katz with [facility] wants patient to receive 2 NS boluses before he admits the patient due to low BP, Dr. Allena Katz will reevaluate low BP after the 2 boluses and determine if patient needs stepdown VS telemetry

## 2013-03-30 NOTE — ED Notes (Signed)
Dr. Gonzella Lex states pt can have clear liquid diet

## 2013-03-30 NOTE — ED Provider Notes (Signed)
Medical screening examination/treatment/procedure(s) were conducted as a shared visit with non-physician practitioner(s) and myself.  I personally evaluated the patient during the encounter. Patient is a 57 year old male past medical history of morbid obesity, congestive heart failure, diabetes. Presents to the emergency department with a five-day history of diarrhea that has been nonbloody. He denies fever he denies abdominal pain. He does feel weak  On exam vitals are stable patient is afebrile. He is awake alert and appropriate. Heart is regular rate and rhythm lungs are clear. Abdomen is obese however is nontender. He does have 2+ pitting lower extremity edema. Mucous membranes are moist.  Workup reveals a metabolic acidosis with acute renal failure. He was given a total of several liters of normal saline in the emergency department. He was seen by Dr. Allena Katz who is unsure if he would be appropriate for the floor or the intensive care unit. After fluid resuscitation his blood pressures were what I feel the appropriate for telemetry. He will be admitted there for IV hydration and monitoring of his renal function.   EKG Interpretation   None        Alexander Lyons, MD 03/30/13 [phone]

## 2013-03-30 NOTE — H&P (Addendum)
[facility] History and Physical  Melody Savidge ZOX:[id number] DOB: Mar 01, 1956 DOA: 03/29/2013  Referring physician: Dr Judd Lien PCP: Leo Grosser, MD   Chief Complaint:  Diarrhea for 6 days  vomiting x 1 day  HPI:  57 y/o morbidly obese mael with diastolic CHF, HTN, HL, OSA, gout, DM on insulin presented with watery diarrhea for 6 days . Patient reports watery diarrhea off 6-7 episodes a day he without any blood or mucus. Patient denies any sick contacts, fever, chills, any recent travel. Denies eating anything outside. He denies any new medications or taking NSAIDs over-the-counter. He denies any headache, dizziness, blurred vision, chest, palpitations, dysuria, hematuria or joint pains. Patient reports having nausea and 1 episode of vomiting this morning.  Course in the ED Patient noted to have a low-grade temperature with low blood pressure of 78/42 mmhg. He was given 2 L normal saline bolus. Blood work done showed mild leukocytosis, anemia, hyperkalemia, anion gap metabolic acidosis and acute kidney injury. Blood gas done showed a mild respiratory acidosis as well. Blood pressure improved with IV fluids [facility] to [facility]metry for SIRS.    Review of Systems:  Constitutional: Denies fever, chills, diaphoresis, appetite change . Reports fatigue. HEENT: Denies photophobia, eye pain, redness, hearing loss, ear pain, congestion, sore throat, rhinorrhea, sneezing, mouth sores, trouble swallowing, neck pain, neck stiffness and tinnitus.   Respiratory: Denies SOB, DOE, cough, chest tightness,  and wheezing.   Cardiovascular: Denies chest pain, palpitations and leg swelling.  Gastrointestinal:  nausea, vomiting,  diarrhea, denies constipation, blood in stool and abdominal distention.  Genitourinary: Denies dysuria, urgency, frequency, hematuria, flank pain and difficulty urinating.  Endocrine: Denies polyuria, polydipsia. Musculoskeletal: Denies  myalgias, back pain, joint swelling, arthralgias and gait problem.  Skin: Denies pallor, rash and wound.  Neurological: Denies dizziness, seizures, syncope, weakness, light-headedness, numbness and headaches.     Past Medical History  Diagnosis Date  . Diabetes mellitus   . Hypertension   . Obesity   . Sleep apnea   . Gouty arthritis    Past Surgical History  Procedure Laterality Date  . Eye surgery    . Knee surgery     Social History:  reports that he has never smoked. He does not have any smokeless tobacco history on file. He reports that he does not drink alcohol or use illicit drugs.  Allergies  Allergen Reactions  . Penicillins Swelling and Rash    Family History  Problem Relation Age of Onset  . Hypertension Sister   . Diabetes Mellitus II Sister     Prior to Admission medications   Medication Sig Start Date End Date Taking? Authorizing Provider  aspirin 325 MG tablet Take 325 mg by mouth daily.   Yes Historical Provider, MD  brinzolamide (AZOPT) 1 % ophthalmic suspension Place 1 drop into both eyes 2 (two) times daily.     Yes Historical Provider, MD  carvedilol (COREG) 12.5 MG tablet Take 12.5 mg by mouth 2 (two) times daily with a meal.   Yes Historical Provider, MD  colchicine 0.6 MG tablet Take 0.6 mg by mouth daily.   Yes Historical Provider, MD  febuxostat (ULORIC) 40 MG tablet Take 80 mg by mouth daily.   Yes Historical Provider, MD  furosemide (LASIX) 40 MG tablet Take 40 mg by mouth 2 (two) times daily.   Yes Historical Provider, MD  gabapentin (NEURONTIN) 300 MG capsule Take 2 capsules (600 mg total) by mouth 3 (three) times  daily. 02/17/13  Yes Donita Brooks, MD  insulin glargine (LANTUS) 100 UNIT/ML injection Inject 45-56 Units into the skin 2 (two) times daily. 45 units QAM    65 units QPM   Yes Historical Provider, MD  insulin regular human CONCENTRATED (HUMULIN R) 500 UNIT/ML SOLN injection Inject 6-10 Units into the skin 3 (three) times daily with  meals. Inject 8 units in AM, 6 units at Lunch, and 10 units at dinner.   Yes Historical Provider, MD  latanoprost (XALATAN) 0.005 % ophthalmic solution Place 1 drop into the left eye at bedtime.    Yes Historical Provider, MD  Liraglutide (VICTOZA) 18 MG/3ML SOLN Inject 1.8 mg into the skin every morning.    Yes Historical Provider, MD  lisinopril (PRINIVIL,ZESTRIL) 10 MG tablet Take 10 mg by mouth daily.   Yes Historical Provider, MD  niacin (NIASPAN) 1000 MG CR tablet Take 1,000 mg by mouth at bedtime.   Yes Historical Provider, MD  pantoprazole (PROTONIX) 40 MG tablet Take 1 tablet (40 mg total) by mouth 2 (two) times daily. 03/07/13  Yes Donita Brooks, MD  prednisoLONE acetate (PRED FORTE) 1 % ophthalmic suspension Place 1 drop into the right eye at bedtime.    Yes Historical Provider, MD  rosuvastatin (CRESTOR) 20 MG tablet Take 20 mg by mouth daily.   Yes Historical Provider, MD  timolol (TIMOPTIC) 0.5 % ophthalmic solution Place 1 drop into both eyes 2 (two) times daily.   Yes Historical Provider, MD  traMADol-acetaminophen (ULTRACET) 37.5-325 MG per tablet Take 1 tablet by mouth every 6 (six) hours as needed for pain.   Yes Historical Provider, MD  vitamin B-12 (CYANOCOBALAMIN) 1000 MCG tablet Take 1,000 mcg by mouth daily.     Yes Historical Provider, MD  Insulin Syringe-Needle U-100 (B-D INS SYR ULTRAFINE .3CC/30G) 30G X 1/2" 0.3 ML MISC USE AS DIRECTED 3 TIMES A DAY. DX CODE: 250.00 01/05/13   Reather Littler, MD    Physical Exam:  Filed Vitals:   03/30/13 0647 03/30/13 0700 03/30/13 0715 03/30/13 0745  BP: 121/45 125/50 116/46 123/55  Pulse:  102 101 101  Temp:      TempSrc:      Resp:      SpO2:  100% 98% 99%    Constitutional: Vital signs reviewed. Patient is a middle aged morbidly obese male lying in bed in no acute distress HEENT: No pallor, no icterus, dry oral mucosa Cardiovascular: RRR, S1 normal, S2 normal, no MRG, pulses symmetric and intact bilaterally Pulmonary/Chest:  diminished breath sounds due to body habitus, no wheezes or crackles Abdominal: Soft.  non-distended, nontender , bowel sounds are normal, no masses, organomegaly, or guarding present.  extremities: Warm, no edema  Neurological: A&O x3, nonfocal Labs on Admission:  Basic Metabolic Panel:  Recent Labs Lab 03/29/13 2325  NA 135  K 5.4*  CL 103  CO2 15*  GLUCOSE 76  BUN 89*  CREATININE 4.46*  CALCIUM 8.3*   Liver Function Tests:  Recent Labs Lab 03/29/13 2325  AST 13  ALT 10  ALKPHOS 43  BILITOT 0.2*  PROT 6.7  ALBUMIN 3.1*   No results found for this basename: LIPASE, AMYLASE,  in the last 168 hours No results found for this basename: AMMONIA,  in the last 168 hours CBC:  Recent Labs Lab 03/29/13 2325  WBC 11.8*  NEUTROABS 7.8*  HGB 9.5*  HCT 28.0*  MCV 81.2  PLT 185   Cardiac Enzymes: No results found for this  basename: CKTOTAL, CKMB, CKMBINDEX, TROPONINI,  in the last 168 hours BNP: No components found with this basename: POCBNP,  CBG: No results found for this basename: GLUCAP,  in the last 168 hours  Radiological Exams on Admission: Ct Abdomen Pelvis Wo Contrast  03/30/2013   CLINICAL DATA:  Diarrhea, nausea.  EXAM: CT ABDOMEN AND PELVIS WITHOUT CONTRAST  TECHNIQUE: Multidetector CT imaging of the abdomen and pelvis was performed following the standard protocol without intravenous contrast.  COMPARISON:  Abdominal ultrasound March 21, 2013.  FINDINGS: Large body habitus required a larger field of view, limiting the overall image quality.  Limited view of the lung bases are clear. Mitral annulus calcification, the heart is not enlarged.  The stomach, small and large bowel are normal in course and caliber. Contrast in the small bowel, has yet to reach the large bowel. No definite and inflammatory changes.  The liver, spleen, gallbladder, pancreas and adrenal glands are grossly normal for this technically limited nonenhanced examination. Please note, possible  cirrhosis suggested on recent ultrasound would not be well assessed on this examination.  Kidneys are normal in size, no nephrolithiasis or hydronephrosis. Great vessels are normal in course and caliber with mild calcific atherosclerosis. Urinary bladder is unremarkable. Prostate is not enlarged. No intraperitoneal free fluid nor free air. Degenerative change of the lumbar spine with suspected moderate to severe canal stenosis at L2-3.  IMPRESSION: Habitus limited examination with no definite acute intra-abdominal pelvic process.   Electronically Signed   By: Awilda Metro   On: 03/30/2013 05:23    EKG: pending  Assessment/Plan Principal Problem:   SIRS (systemic inflammatory response syndrome) Admit to telemetry  patient presented with persistent diarrhea with low BP, mild tachycardia and low grade temperature. Blood work showing mixed metabolic and respiratory acidosis.  -check stool for c diff and GI pathogen. Check stool culture. -gien 2 L IV NS with improvement in BP. Will place him on IVNS@100  CC/ hr for now. Monitor for volume overload given diastolic CHF.hold all BP meds. -monitor strict I/O and daily weights.   Active Problems:  Acute tubular necrosis Likely prerenal acute on CKD  with dehydration. Last creatinine of 1.8. UA unremarkable. FeNa of 0.8. Avoid nephrotixins. Hold lasix and ACEi Check renal US  uric acid level wnl Monitor strict I/O Will get renal consult if renal function not improved with hydration.    Metabolic acidosis Likely secondary to diarrhea. Check repeat labs in pm     Diarrhea As outlined above  Vomiting  one episode this am. Place on clears. Prn zofran.  Hyperkalemia  hold ACEi  Monitor on tele    IDDM (insulin dependent diabetes mellitus) Last A1C of 8.  on lantus 45-65 units bid. Will place on 35 untis bid. Ordered SSI    OSA (obstructive sleep apnea) Not on BiPAP    Diastolic CHF, chronic Currently euvolemic. Continue ASA. Hold  lasix, ACEi and coreg    HTN (hypertension) Hold all BP meds. Monitor BP closely. Check orthostasis    HLD (hyperlipidemia) Continue statin  Gout  hold colchicine. continue uloric.    Morbid obesity Being evaluated for bariatric surgery as outpt.  Anemia Possibly of ACD. Check iron panel  Glaucoma  resume home meds  Diet: clear liquids  DVT prophylaxis: sq heparin  Code Status: full code Family Communication: wifwe at bedside Disposition Plan: telemetry  Eddie North [facility] Pager [phone]  If 7PM-7AM, please contact night-coverage [email or web address] Password [facility] 03/30/2013, 8:38 AM    Total  time spent: 70 minutes

## 2013-03-30 NOTE — ED Notes (Signed)
Admitting MD at bedside.

## 2013-03-30 NOTE — ED Provider Notes (Signed)
CSN: [id number]     Arrival date & time 03/29/13  2300 History   First MD Initiated Contact with Patient 03/30/13 0035     Chief Complaint  Patient presents with  . Diarrhea   (Consider location/radiation/quality/duration/timing/severity/associated sxs/prior Treatment) HPI History provided by pt and his wife.  Pt reports diarrhea for the past 5-6 days.  Had an episode of vomiting at onset and then another last night.  Unable to tolerate food/fluids.  Associated w/ generalized weakness.  Has not had fever, CP, SOB, abd pain, urinary sx.  No known sick contacts.  No recent travel.  No recent abx or other medications changes.   Past Medical History  Diagnosis Date  . Diabetes mellitus   . Hypertension   . Obesity   . Sleep apnea   . Gouty arthritis    Past Surgical History  Procedure Laterality Date  . Eye surgery    . Knee surgery     Family History  Problem Relation Age of Onset  . Hypertension Sister   . Diabetes Mellitus II Sister    History  Substance Use Topics  . Smoking status: Never Smoker   . Smokeless tobacco: Not on file  . Alcohol Use: No    Review of Systems  All other systems reviewed and are negative.    Allergies  Penicillins  Home Medications   Current Outpatient Rx  Name  Route  Sig  Dispense  Refill  . aspirin 325 MG tablet   Oral   Take 325 mg by mouth daily.         . brinzolamide (AZOPT) 1 % ophthalmic suspension   Both Eyes   Place 1 drop into both eyes 2 (two) times daily.           . carvedilol (COREG) 12.5 MG tablet   Oral   Take 12.5 mg by mouth 2 (two) times daily with a meal.         . colchicine 0.6 MG tablet   Oral   Take 0.6 mg by mouth daily.         . febuxostat (ULORIC) 40 MG tablet   Oral   Take 80 mg by mouth daily.         . furosemide (LASIX) 40 MG tablet   Oral   Take 40 mg by mouth 2 (two) times daily.         Marland Kitchen gabapentin (NEURONTIN) 300 MG capsule   Oral   Take 2 capsules (600 mg total) by  mouth 3 (three) times daily.   180 capsule   3   . insulin glargine (LANTUS) 100 UNIT/ML injection   Subcutaneous   Inject 45-56 Units into the skin 2 (two) times daily. 45 units QAM    65 units QPM         . insulin regular human CONCENTRATED (HUMULIN R) 500 UNIT/ML SOLN injection   Subcutaneous   Inject 6-10 Units into the skin 3 (three) times daily with meals. Inject 8 units in AM, 6 units at Lunch, and 10 units at dinner.         . latanoprost (XALATAN) 0.005 % ophthalmic solution   Left Eye   Place 1 drop into the left eye at bedtime.          . Liraglutide (VICTOZA) 18 MG/3ML SOLN   Subcutaneous   Inject 1.8 mg into the skin every morning.          Marland Kitchen lisinopril (PRINIVIL,ZESTRIL) 10  MG tablet   Oral   Take 10 mg by mouth daily.         . niacin (NIASPAN) 1000 MG CR tablet   Oral   Take 1,000 mg by mouth at bedtime.         . pantoprazole (PROTONIX) 40 MG tablet   Oral   Take 1 tablet (40 mg total) by mouth 2 (two) times daily.   60 tablet   3   . prednisoLONE acetate (PRED FORTE) 1 % ophthalmic suspension   Right Eye   Place 1 drop into the right eye at bedtime.          . rosuvastatin (CRESTOR) 20 MG tablet   Oral   Take 20 mg by mouth daily.         . timolol (TIMOPTIC) 0.5 % ophthalmic solution   Both Eyes   Place 1 drop into both eyes 2 (two) times daily.         . traMADol-acetaminophen (ULTRACET) 37.5-325 MG per tablet   Oral   Take 1 tablet by mouth every 6 (six) hours as needed for pain.         . vitamin B-12 (CYANOCOBALAMIN) 1000 MCG tablet   Oral   Take 1,000 mcg by mouth daily.           . Insulin Syringe-Needle U-100 (B-D INS SYR ULTRAFINE .3CC/30G) 30G X 1/2" 0.3 ML MISC      USE AS DIRECTED 3 TIMES A DAY. DX CODE: 250.00   100 each   3    BP 90/52  Pulse 83  Temp(Src) 99.9 F (37.7 C) (Oral)  Resp 20  SpO2 95% Physical Exam  Nursing note and vitals reviewed. Constitutional: He is oriented to person, place,  and time. He appears well-developed and well-nourished. No distress.  HENT:  Head: Normocephalic and atraumatic.  Dry mucous membranes  Eyes:  Normal appearance  Neck: Normal range of motion.  Cardiovascular: Normal rate and regular rhythm.   Pulmonary/Chest: Effort normal and breath sounds normal. No respiratory distress.  Abdominal: Soft. Bowel sounds are normal. He exhibits no distension and no mass. There is no tenderness. There is no rebound and no guarding.  Obese.  Diffusely, mildly ttp  Genitourinary:  No CVA tenderness  Musculoskeletal: Normal range of motion.  Neurological: He is alert and oriented to person, place, and time.  Skin: Skin is warm and dry. No rash noted.  Psychiatric: He has a normal mood and affect. His behavior is normal.    ED Course  Procedures (including critical care time) Labs Review Labs Reviewed  COMPREHENSIVE METABOLIC PANEL - Abnormal; Notable for the following:    Potassium 5.4 (*)    CO2 15 (*)    BUN 89 (*)    Creatinine, Ser 4.46 (*)    Calcium 8.3 (*)    Albumin 3.1 (*)    Total Bilirubin 0.2 (*)    GFR calc non Af Amer 13 (*)    GFR calc Af Amer 16 (*)    All other components within normal limits  CBC WITH DIFFERENTIAL - Abnormal; Notable for the following:    WBC 11.8 (*)    RBC 3.45 (*)    Hemoglobin 9.5 (*)    HCT 28.0 (*)    Neutro Abs 7.8 (*)    All other components within normal limits  BLOOD GAS, ARTERIAL  CG4 I-STAT (LACTIC ACID)   Imaging Review No results found.  EKG Interpretation  None       MDM  No diagnosis found. 57yo morbidly obese M w/ diabetes, HTN and CHF presents w/ 5-6 days of N/V/D and generalized weakness.  No fever, abd pain or other associated sx.  On exam, afebrile, non-toxic appearing, mildly hypotensive, abd soft/non-distended, mildly, diffusely ttp.  Labs sig for acute kidney injury and acidosis.  U/A and ABG pending.  CT abd/pelvis pending.  Pt to receive IVF and zofran.  Dr. Judd Lien aware of  patient.  1:36 AM   ABG shows mixed metabolic/respiratory acidosis.  CT unremarkable.  All results discussed w/ patient.  He continues to look well and is A&O, but BP has dropped to 78/45.  Second line started.  Pt to receive 2L NS bolus.  Has had one so far.  Case discussed w/ [facility] but admission to stepdown contingent on response to fluid resuscitation.  6:47 AM   Otilio Miu, PA-C 03/30/13 [id number]

## 2013-03-30 NOTE — ED Notes (Signed)
MD at Bedside.

## 2013-03-30 NOTE — ED Notes (Signed)
MD Delo at bedside. 

## 2013-03-31 DIAGNOSIS — I509 Heart failure, unspecified: Secondary | ICD-10-CM

## 2013-03-31 DIAGNOSIS — R0902 Hypoxemia: Secondary | ICD-10-CM

## 2013-03-31 DIAGNOSIS — E872 Acidosis: Secondary | ICD-10-CM

## 2013-03-31 DIAGNOSIS — I5032 Chronic diastolic (congestive) heart failure: Secondary | ICD-10-CM

## 2013-03-31 LAB — RENAL FUNCTION PANEL
BUN: 78 mg/dL — ABNORMAL HIGH (ref 6–23)
Chloride: 105 mEq/L (ref 96–112)
Glucose, Bld: 166 mg/dL — ABNORMAL HIGH (ref 70–99)
Phosphorus: 4.4 mg/dL (ref 2.3–4.6)
Potassium: 5.5 mEq/L — ABNORMAL HIGH (ref 3.5–5.1)

## 2013-03-31 LAB — BASIC METABOLIC PANEL
BUN: 86 mg/dL — ABNORMAL HIGH (ref 6–23)
Calcium: 8 mg/dL — ABNORMAL LOW (ref 8.4–10.5)
Chloride: 106 mEq/L (ref 96–112)
Creatinine, Ser: 4.14 mg/dL — ABNORMAL HIGH (ref 0.50–1.35)
GFR calc Af Amer: 17 mL/min — ABNORMAL LOW (ref >90)
GFR calc non Af Amer: 15 mL/min — ABNORMAL LOW (ref >90)
Glucose, Bld: 134 mg/dL — ABNORMAL HIGH (ref 70–99)
Potassium: 5.7 mEq/L — ABNORMAL HIGH (ref 3.5–5.1)

## 2013-03-31 LAB — BLOOD GAS, ARTERIAL
Acid-base deficit: 11 mmol/L — ABNORMAL HIGH (ref 0.0–2.0)
O2 Content: 2 L/min
O2 Saturation: 96.3 %
pCO2 arterial: 39.5 mmHg (ref 35.0–45.0)
pH, Arterial: 7.213 — ABNORMAL LOW (ref 7.350–7.450)

## 2013-03-31 LAB — GI PATHOGEN PANEL BY PCR, STOOL
E coli (ETEC) LT/ST: NEGATIVE
E coli (STEC): NEGATIVE
Norovirus GI/GII: NEGATIVE
Salmonella by PCR: NEGATIVE
Shigella by PCR: NEGATIVE

## 2013-03-31 LAB — CBC
HCT: 27.5 % — ABNORMAL LOW (ref 39.0–52.0)
Hemoglobin: 8.9 g/dL — ABNORMAL LOW (ref 13.0–17.0)
MCH: 27.2 pg (ref 26.0–34.0)
MCHC: 32.4 g/dL (ref 30.0–36.0)
Platelets: 177 10*3/uL (ref 150–400)

## 2013-03-31 LAB — GLUCOSE, CAPILLARY
Glucose-Capillary: 121 mg/dL — ABNORMAL HIGH (ref 70–99)
Glucose-Capillary: 141 mg/dL — ABNORMAL HIGH (ref 70–99)
Glucose-Capillary: 146 mg/dL — ABNORMAL HIGH (ref 70–99)
Glucose-Capillary: 160 mg/dL — ABNORMAL HIGH (ref 70–99)

## 2013-03-31 LAB — MRSA PCR SCREENING: MRSA by PCR: NEGATIVE

## 2013-03-31 MED ORDER — SODIUM POLYSTYRENE SULFONATE 15 GM/60ML PO SUSP
15.0000 g | Freq: Once | ORAL | Status: AC
  Administered 2013-03-31: 15 g via ORAL
  Filled 2013-03-31: qty 60

## 2013-03-31 MED ORDER — SODIUM CHLORIDE 0.9 % IV SOLN
125.0000 mg | Freq: Every day | INTRAVENOUS | Status: AC
  Administered 2013-03-31 – 2013-04-01 (×2): 125 mg via INTRAVENOUS
  Filled 2013-03-31 (×3): qty 10

## 2013-03-31 MED ORDER — STERILE WATER FOR INJECTION IV SOLN
INTRAVENOUS | Status: DC
  Administered 2013-03-31 – 2013-04-02 (×4): via INTRAVENOUS
  Filled 2013-03-31 (×11): qty 850

## 2013-03-31 NOTE — Progress Notes (Signed)
Bipap on standby at bedside.

## 2013-03-31 NOTE — Consult Note (Signed)
[facility] Renal Consultation Note  Requesting MD: Regalado Indication for Consultation: A on CRF   HPI:  Alexander Hubbard is a 57 y.o. male with PMhx significant for morbid obesity, diastolic heart failure, hypertension, hyperlipidemia, obstructive sleep apnea, gout as well as diabetes mellitus. He also seems to have a history of chronic kidney disease with creatinine between 1.8 and 2.0. Patient presented to the hospital on the evening of 10/28 with a several day history of watery diarrhea. He was noticed to be hypotensive and was still taking his lisinopril, Coreg and Lasix.  His presenting creatinine was 4.46. I did improve to 3.97 with conservative measures and hydration but is noted to be 4.14 at this morning and that is the reason for consult. Patient is making urine, in fact may 1400 cc yesterday. His urinalysis is noted to be fairly bland with proteinuria only at 100 and no cells but did have granular casts. Renal ultrasound shows large kidneys and no hydro-.  He has been acidotic and is on a bicarb drip and it is improving. He really is not complaining of anything today. He states that he feels better than when he came in. He denies use of nonsteroidal anti-inflammatory drugs at home. He does report that he was making good urine at home as well even just prior to coming in.   Creat  Date/Time Value Range Status  03/07/2013  3:21 PM 1.93* 0.50 - 1.35 mg/dL Final  06/07/1094  0:45 PM 1.90* 0.50 - 1.35 mg/dL Final  09/08/8117 14:78 AM 1.79* 0.50 - 1.35 mg/dL Final     Creatinine, Ser  Date/Time Value Range Status  03/31/2013  4:50 AM 4.14* 0.50 - 1.35 mg/dL Final  29/56/2130  8:65 PM 3.97* 0.50 - 1.35 mg/dL Final  78/46/9629 52:84 PM 4.46* 0.50 - 1.35 mg/dL Final  06/04/2438  1:02 AM 1.8* 0.4 - 1.5 mg/dL Final  12/25/3662  4:03 AM 2.16* 0.50 - 1.35 mg/dL Final  4/74/2595  6:38 PM 2.24* 0.50 - 1.35 mg/dL Final  12/05/6431  2:95 AM 2.03* 0.50 - 1.35 mg/dL Final     PMHx:   Past  Medical History  Diagnosis Date  . Diabetes mellitus   . Hypertension   . Obesity   . Sleep apnea   . Gouty arthritis     Past Surgical History  Procedure Laterality Date  . Eye surgery    . Knee surgery      Family Hx:  Family History  Problem Relation Age of Onset  . Hypertension Sister   . Diabetes Mellitus II Sister     Social History:  reports that he has never smoked. He has never used smokeless tobacco. He reports that he does not drink alcohol or use illicit drugs.  Allergies:  Allergies  Allergen Reactions  . Penicillins Swelling and Rash    Medications: Prior to Admission medications   Medication Sig Start Date End Date Taking? Authorizing Provider  aspirin 325 MG tablet Take 325 mg by mouth daily.   Yes Historical Provider, MD  brinzolamide (AZOPT) 1 % ophthalmic suspension Place 1 drop into both eyes 2 (two) times daily.     Yes Historical Provider, MD  carvedilol (COREG) 12.5 MG tablet Take 12.5 mg by mouth 2 (two) times daily with a meal.   Yes Historical Provider, MD  colchicine 0.6 MG tablet Take 0.6 mg by mouth daily.   Yes Historical Provider, MD  febuxostat (ULORIC) 40 MG tablet Take 80 mg by mouth daily.  Yes Historical Provider, MD  furosemide (LASIX) 40 MG tablet Take 40 mg by mouth 2 (two) times daily.   Yes Historical Provider, MD  gabapentin (NEURONTIN) 300 MG capsule Take 2 capsules (600 mg total) by mouth 3 (three) times daily. 02/17/13  Yes Donita Brooks, MD  insulin glargine (LANTUS) 100 UNIT/ML injection Inject 45-56 Units into the skin 2 (two) times daily. 45 units QAM    65 units QPM   Yes Historical Provider, MD  insulin regular human CONCENTRATED (HUMULIN R) 500 UNIT/ML SOLN injection Inject 6-10 Units into the skin 3 (three) times daily with meals. Inject 8 units in AM, 6 units at Lunch, and 10 units at dinner.   Yes Historical Provider, MD  latanoprost (XALATAN) 0.005 % ophthalmic solution Place 1 drop into the left eye at bedtime.     Yes Historical Provider, MD  Liraglutide (VICTOZA) 18 MG/3ML SOLN Inject 1.8 mg into the skin every morning.    Yes Historical Provider, MD  lisinopril (PRINIVIL,ZESTRIL) 10 MG tablet Take 10 mg by mouth daily.   Yes Historical Provider, MD  niacin (NIASPAN) 1000 MG CR tablet Take 1,000 mg by mouth at bedtime.   Yes Historical Provider, MD  pantoprazole (PROTONIX) 40 MG tablet Take 1 tablet (40 mg total) by mouth 2 (two) times daily. 03/07/13  Yes Donita Brooks, MD  prednisoLONE acetate (PRED FORTE) 1 % ophthalmic suspension Place 1 drop into the right eye at bedtime.    Yes Historical Provider, MD  rosuvastatin (CRESTOR) 20 MG tablet Take 20 mg by mouth daily.   Yes Historical Provider, MD  timolol (TIMOPTIC) 0.5 % ophthalmic solution Place 1 drop into both eyes 2 (two) times daily.   Yes Historical Provider, MD  traMADol-acetaminophen (ULTRACET) 37.5-325 MG per tablet Take 1 tablet by mouth every 6 (six) hours as needed for pain.   Yes Historical Provider, MD  vitamin B-12 (CYANOCOBALAMIN) 1000 MCG tablet Take 1,000 mcg by mouth daily.     Yes Historical Provider, MD  Insulin Syringe-Needle U-100 (B-D INS SYR ULTRAFINE .3CC/30G) 30G X 1/2" 0.3 ML MISC USE AS DIRECTED 3 TIMES A DAY. DX CODE: 250.00 01/05/13   Reather Littler, MD    I have reviewed the patient's current medications.  Labs:  Results for orders placed during the hospital encounter of 03/29/13 (from the past 48 hour(s))  COMPREHENSIVE METABOLIC PANEL     Status: Abnormal   Collection Time    03/29/13 11:25 PM      Result Value Range   Sodium 135  135 - 145 mEq/L   Potassium 5.4 (*) 3.5 - 5.1 mEq/L   Chloride 103  96 - 112 mEq/L   CO2 15 (*) 19 - 32 mEq/L   Glucose, Bld 76  70 - 99 mg/dL   BUN 89 (*) 6 - 23 mg/dL   Creatinine, Ser 2.95 (*) 0.50 - 1.35 mg/dL   Calcium 8.3 (*) 8.4 - 10.5 mg/dL   Total Protein 6.7  6.0 - 8.3 g/dL   Albumin 3.1 (*) 3.5 - 5.2 g/dL   AST 13  0 - 37 U/L   ALT 10  0 - 53 U/L   Alkaline Phosphatase 43   39 - 117 U/L   Total Bilirubin 0.2 (*) 0.3 - 1.2 mg/dL   GFR calc non Af Amer 13 (*) >90 mL/min   GFR calc Af Amer 16 (*) >90 mL/min   Comment: (NOTE)     The eGFR has been calculated using  the CKD EPI equation.     This calculation has not been validated in all clinical situations.     eGFR's persistently <90 mL/min signify possible Chronic Kidney     Disease.  CBC WITH DIFFERENTIAL     Status: Abnormal   Collection Time    03/29/13 11:25 PM      Result Value Range   WBC 11.8 (*) 4.0 - 10.5 K/uL   RBC 3.45 (*) 4.22 - 5.81 MIL/uL   Hemoglobin 9.5 (*) 13.0 - 17.0 g/dL   HCT 40.9 (*) 81.1 - 91.4 %   MCV 81.2  78.0 - 100.0 fL   MCH 27.5  26.0 - 34.0 pg   MCHC 33.9  30.0 - 36.0 g/dL   RDW 78.2  95.6 - 21.3 %   Platelets 185  150 - 400 K/uL   Neutrophils Relative % 66  43 - 77 %   Lymphocytes Relative 24  12 - 46 %   Monocytes Relative 7  3 - 12 %   Eosinophils Relative 3  0 - 5 %   Basophils Relative 0  0 - 1 %   Neutro Abs 7.8 (*) 1.7 - 7.7 K/uL   Lymphs Abs 2.8  0.7 - 4.0 K/uL   Monocytes Absolute 0.8  0.1 - 1.0 K/uL   Eosinophils Absolute 0.4  0.0 - 0.7 K/uL   Basophils Absolute 0.0  0.0 - 0.1 K/uL   RBC Morphology POLYCHROMASIA PRESENT    CG4 I-STAT (LACTIC ACID)     Status: None   Collection Time    03/29/13 11:28 PM      Result Value Range   Lactic Acid, Venous 1.74  0.5 - 2.2 mmol/L  POCT I-STAT 3, BLOOD GAS (G3+)     Status: Abnormal   Collection Time    03/30/13  1:53 AM      Result Value Range   pH, Arterial 7.195 (*) 7.350 - 7.450   pCO2 arterial 45.1 (*) 35.0 - 45.0 mmHg   pO2, Arterial 103.0 (*) 80.0 - 100.0 mmHg   Bicarbonate 17.3 (*) 20.0 - 24.0 mEq/L   TCO2 19  0 - 100 mmol/L   O2 Saturation 96.0     Acid-base deficit 10.0 (*) 0.0 - 2.0 mmol/L   Patient temperature 99.7 F     Collection site RADIAL, ALLEN'S TEST ACCEPTABLE     Drawn by Operator     Sample type ARTERIAL    CULTURE, BLOOD (ROUTINE X 2)     Status: None   Collection Time    03/30/13   8:48 AM      Result Value Range   Specimen Description BLOOD LEFT HAND     Special Requests BOTTLES DRAWN AEROBIC ONLY 10CC     Culture  Setup Time       Value: 03/30/2013 16:21     Performed at [facility]   Culture       Value:        BLOOD CULTURE RECEIVED NO GROWTH TO DATE CULTURE WILL BE HELD FOR 5 DAYS BEFORE ISSUING A FINAL NEGATIVE REPORT     Performed at [facility]   Report Status PENDING    CULTURE, BLOOD (ROUTINE X 2)     Status: None   Collection Time    03/30/13 10:05 AM      Result Value Range   Specimen Description BLOOD RIGHT ARM     Special Requests BOTTLES DRAWN AEROBIC ONLY 10CC  Culture  Setup Time       Value: 03/30/2013 17:36     Performed at [facility]   Culture       Value:        BLOOD CULTURE RECEIVED NO GROWTH TO DATE CULTURE WILL BE HELD FOR 5 DAYS BEFORE ISSUING A FINAL NEGATIVE REPORT     Performed at [facility]   Report Status PENDING    LIPASE, BLOOD     Status: None   Collection Time    03/30/13 10:24 AM      Result Value Range   Lipase 14  11 - 59 U/L  IRON AND TIBC     Status: Abnormal   Collection Time    03/30/13 10:24 AM      Result Value Range   Iron 22 (*) 42 - 135 ug/dL   TIBC 119  147 - 829 ug/dL   Saturation Ratios 8 (*) 20 - 55 %   UIBC 266  125 - 400 ug/dL   Comment: Performed at [facility]  URIC ACID     Status: None   Collection Time    03/30/13 10:24 AM      Result Value Range   Uric Acid, Serum 7.7  4.0 - 7.8 mg/dL  GLUCOSE, CAPILLARY     Status: None   Collection Time    03/30/13 11:32 AM      Result Value Range   Glucose-Capillary 81  70 - 99 mg/dL   Comment 1 Notify RN     Comment 2 Documented in Chart    URINALYSIS, ROUTINE W REFLEX MICROSCOPIC     Status: Abnormal   Collection Time    03/30/13 12:17 PM      Result Value Range   Color, Urine YELLOW  YELLOW   APPearance CLEAR  CLEAR   Specific Gravity, Urine 1.009  1.005 - 1.030   pH 5.0  5.0 - 8.0    Glucose, UA NEGATIVE  NEGATIVE mg/dL   Hgb urine dipstick NEGATIVE  NEGATIVE   Bilirubin Urine NEGATIVE  NEGATIVE   Ketones, ur NEGATIVE  NEGATIVE mg/dL   Protein, ur 562 (*) NEGATIVE mg/dL   Urobilinogen, UA 0.2  0.0 - 1.0 mg/dL   Nitrite NEGATIVE  NEGATIVE   Leukocytes, UA NEGATIVE  NEGATIVE  SODIUM, URINE, RANDOM     Status: None   Collection Time    03/30/13 12:17 PM      Result Value Range   Sodium, Ur 25    CREATININE, URINE, RANDOM     Status: None   Collection Time    03/30/13 12:17 PM      Result Value Range   Creatinine, Urine 110.32    OSMOLALITY, URINE     Status: Abnormal   Collection Time    03/30/13 12:17 PM      Result Value Range   Osmolality, Ur 262 (*) 390 - 1090 mOsm/kg   Comment: Performed at [facility]  URINE MICROSCOPIC-ADD ON     Status: Abnormal   Collection Time    03/30/13 12:17 PM      Result Value Range   Squamous Epithelial / LPF FEW (*) RARE   WBC, UA 0-2  <3 WBC/hpf   Bacteria, UA RARE  RARE   Casts GRANULAR CAST (*) NEGATIVE  CLOSTRIDIUM DIFFICILE BY PCR     Status: None   Collection Time    03/30/13 12:18 PM      Result  Value Range   C difficile by pcr NEGATIVE  NEGATIVE  GLUCOSE, CAPILLARY     Status: Abnormal   Collection Time    03/30/13  3:41 PM      Result Value Range   Glucose-Capillary 139 (*) 70 - 99 mg/dL  BASIC METABOLIC PANEL     Status: Abnormal   Collection Time    03/30/13  6:30 PM      Result Value Range   Sodium 133 (*) 135 - 145 mEq/L   Potassium 6.1 (*) 3.5 - 5.1 mEq/L   Comment: HEMOLYSIS AT THIS LEVEL MAY AFFECT RESULT   Chloride 105  96 - 112 mEq/L   CO2 14 (*) 19 - 32 mEq/L   Glucose, Bld 162 (*) 70 - 99 mg/dL   BUN 87 (*) 6 - 23 mg/dL   Creatinine, Ser 1.61 (*) 0.50 - 1.35 mg/dL   Calcium 7.8 (*) 8.4 - 10.5 mg/dL   GFR calc non Af Amer 15 (*) >90 mL/min   GFR calc Af Amer 18 (*) >90 mL/min   Comment: (NOTE)     The eGFR has been calculated using the CKD EPI equation.     This calculation has  not been validated in all clinical situations.     eGFR's persistently <90 mL/min signify possible Chronic Kidney     Disease.  GLUCOSE, CAPILLARY     Status: Abnormal   Collection Time    03/30/13  8:06 PM      Result Value Range   Glucose-Capillary 145 (*) 70 - 99 mg/dL   Comment 1 Notify RN    GLUCOSE, CAPILLARY     Status: Abnormal   Collection Time    03/31/13 12:56 AM      Result Value Range   Glucose-Capillary 121 (*) 70 - 99 mg/dL   Comment 1 Notify RN    BASIC METABOLIC PANEL     Status: Abnormal   Collection Time    03/31/13  4:50 AM      Result Value Range   Sodium 135  135 - 145 mEq/L   Potassium 5.7 (*) 3.5 - 5.1 mEq/L   Chloride 106  96 - 112 mEq/L   CO2 17 (*) 19 - 32 mEq/L   Glucose, Bld 134 (*) 70 - 99 mg/dL   BUN 86 (*) 6 - 23 mg/dL   Creatinine, Ser 0.96 (*) 0.50 - 1.35 mg/dL   Calcium 8.0 (*) 8.4 - 10.5 mg/dL   GFR calc non Af Amer 15 (*) >90 mL/min   GFR calc Af Amer 17 (*) >90 mL/min   Comment: (NOTE)     The eGFR has been calculated using the CKD EPI equation.     This calculation has not been validated in all clinical situations.     eGFR's persistently <90 mL/min signify possible Chronic Kidney     Disease.  CBC     Status: Abnormal   Collection Time    03/31/13  4:50 AM      Result Value Range   WBC 10.9 (*) 4.0 - 10.5 K/uL   RBC 3.27 (*) 4.22 - 5.81 MIL/uL   Hemoglobin 8.9 (*) 13.0 - 17.0 g/dL   HCT 04.5 (*) 40.9 - 81.1 %   MCV 84.1  78.0 - 100.0 fL   MCH 27.2  26.0 - 34.0 pg   MCHC 32.4  30.0 - 36.0 g/dL   RDW 91.4  78.2 - 95.6 %   Platelets 177  150 -  400 K/uL  GLUCOSE, CAPILLARY     Status: Abnormal   Collection Time    03/31/13  7:58 AM      Result Value Range   Glucose-Capillary 110 (*) 70 - 99 mg/dL   Comment 1 Notify RN    BLOOD GAS, ARTERIAL     Status: Abnormal   Collection Time    03/31/13  9:07 AM      Result Value Range   O2 Content 2.0     Delivery systems NASAL CANNULA     pH, Arterial 7.213 (*) 7.350 - 7.450   pCO2  arterial 39.5  35.0 - 45.0 mmHg   pO2, Arterial 111.0 (*) 80.0 - 100.0 mmHg   Bicarbonate 15.3 (*) 20.0 - 24.0 mEq/L   TCO2 16.5  0 - 100 mmol/L   Acid-base deficit 11.0 (*) 0.0 - 2.0 mmol/L   O2 Saturation 96.3     Patient temperature 98.6     Collection site RIGHT RADIAL     Drawn by ARTERIAL     Sample type [id number]     Allens test (pass/fail) PASS  PASS  GLUCOSE, CAPILLARY     Status: Abnormal   Collection Time    03/31/13  9:17 AM      Result Value Range   Glucose-Capillary 146 (*) 70 - 99 mg/dL   Comment 1 Notify RN    GLUCOSE, CAPILLARY     Status: Abnormal   Collection Time    03/31/13 11:21 AM      Result Value Range   Glucose-Capillary 141 (*) 70 - 99 mg/dL   Comment 1 Notify RN       ROS:  A comprehensive review of systems was negative except for: Gastrointestinal: positive for abdominal pain and diarrhea Genitourinary: positive for frequency  Physical Exam: Filed Vitals:   03/31/13 1211  BP: 138/53  Pulse:   Temp: 99.5 F (37.5 C)  Resp:      General: Morbidly obese man who is quite talkative. He is lying flat on his back in no acute distress. CPAP is being reapplied HEENT: Pupils are equal round reactive to light symmetrical motions are intact, mucous membranes are moist. Neck: Difficult to determine jugular venous distention due to size. There is no lymphadenopathy. Heart:  Slightly tachycardic, no murmur. Lungs: Anteriorly clear. Abdomen: Massively obese. Positive bowel sounds, soft, nontender to palpation. Extremities: Trace 1+ pitting edema with discoloration Skin: Warm and dry Neuro: Alert and oriented. The remainder of the neurologic exam is nonfocal.  Assessment/Plan: 57 year old white male with multiple medical issues including mild CKD at baseline. He now presents with acute on chronic renal failure in the setting of diarrhea/hypotension on an ACE inhibitor. 1.Renal- acute on chronic renal failure in the setting of above. Urinalysis consistent  with ATN. Renal ultrasound is unremarkable. He is nonoliguric at this time. He also does not appear to be uremic. There are no indications for dialysis at this time. What I would like to do is increase the rate on his sodium bicarbonate drip in order to get him just a little bit more hydration and to assist with the hyperkalemia. I will order labs later today and in the morning to continue to follow. If this is just do to ATN it should recover. However, given the fact that he had some CK DL Baseline it may take a little bit longer than usual. 2. Hypertension/volume  - blood pressure is much improved from admission. Currently blood pressure medications are on hold.  Feel free to add back Corag at her leisure but I would continue to hold lisinopril until kidney function is back to baseline. Volume status is difficult to determine due to his size. We will give gentle hydration in the form of sodium bicarbonate 3. metabolic acidosis  - due to AKI. It is improving with sodium bicarbonate repletion. We'll continue this. 4. Hyperkalemia- hopefully will improve with continued administration of sodium bicarbonate. Hesitant to give Kayexalate just because of his recent history of diarrhea. Will check potassium again later today. 5. Anemia- also with decreased iron stores. Query possible GI bleed is etiology for diarrhea. Will replete iron in the form of IV iron and follow closely.  Thank you for this consultation. We will continue to follow with you   Rosmary Dionisio A 03/31/2013, 12:56 PM

## 2013-03-31 NOTE — Progress Notes (Addendum)
[facility] PROGRESS NOTE  Alexander Hubbard OZH:[id number] DOB: 1956/04/30 DOA: 03/29/2013 PCP: Leo Grosser, MD  Assessment/Plan: 1-Encephalopathy: multifactorial : acidosis, renal failure. Transfer to step down for close monitoring in setting of acidosis.   2-Acute on CKD: probably ATN in setting diarrhea, hypotension, ACE. Continue to hold ACE. No improvement in Cr, will consult renal, Started on IV bicarb gtt. For hyperkalemia received one dose of kayexalate.   3-Metabolic acidosis; Started on bicarb gtt. Could be secondary to renal failure, diarrhea. Lactic acid at 1.7.   4-SIRS: presents with diarrhea, hypotension, tachycardia. Continue with IV fluids. BP improved now in the 100 range. Continue to hold BP medications. FU blood culture. Due to metabolic acidosis and encephalopathy will transfer patient to step down unit and will consult CCM.    5-Diarrhea; GI pathogen pending. C diff negative.  6-Diastolic HF: monitor for pulmonary edema.  7-Anemia; check anemia panel. Monitor HB.  Addendum:  8-Diabetes; Hold long acting usulin in setting poor oral intake. Continue with SSI.   Code Status: Full Code.  Family Communication: care discussed with wife.  Disposition Plan: transfer to step down for close monitoring.    Consultants:  CCM  Neprhology  Procedures:  Renal US: normal exam.   Antibiotics:  none  HPI/Subjective: Patient feels tired, sleepy, same as yesterday. He feels blurry vision is better than yesterday. Mild tremors.    Objective: Filed Vitals:   03/31/13 0430  BP: 110/62  Pulse:   Temp:   Resp:     Intake/Output Summary (Last 24 hours) at 03/31/13 0737 Last data filed at 03/31/13 0647  Gross per 24 hour  Intake   1355 ml  Output   1426 ml  Net    -71 ml   Filed Weights   03/30/13 1114  Weight: 182.165 kg (401 lb 9.6 oz)    Exam:   General:  Periorbital edema, Awake, fall sleep in between conversation. Somnolent.    Cardiovascular: S 1, S 2 RRR  Respiratory: decrease breath sound.   Abdomen: BS present, soft, NT  Musculoskeletal: positive edema  Data Reviewed: Basic Metabolic Panel:  Recent Labs Lab 03/29/13 2325 03/30/13 1830 03/31/13 0450  NA 135 133* 135  K 5.4* 6.1* 5.7*  CL 103 105 106  CO2 15* 14* 17*  GLUCOSE 76 162* 134*  BUN 89* 87* 86*  CREATININE 4.46* 3.97* 4.14*  CALCIUM 8.3* 7.8* 8.0*   Liver Function Tests:  Recent Labs Lab 03/29/13 2325  AST 13  ALT 10  ALKPHOS 43  BILITOT 0.2*  PROT 6.7  ALBUMIN 3.1*    Recent Labs Lab 03/30/13 1024  LIPASE 14   No results found for this basename: AMMONIA,  in the last 168 hours CBC:  Recent Labs Lab 03/29/13 2325  WBC 11.8*  NEUTROABS 7.8*  HGB 9.5*  HCT 28.0*  MCV 81.2  PLT 185   Cardiac Enzymes: No results found for this basename: CKTOTAL, CKMB, CKMBINDEX, TROPONINI,  in the last 168 hours BNP (last 3 results)  Recent Labs  09/22/12 0927  PROBNP 225.2*   CBG:  Recent Labs Lab 03/30/13 1132 03/30/13 1541 03/30/13 2006 03/31/13 0056  GLUCAP 81 139* 145* 121*    Recent Results (from the past 240 hour(s))  CLOSTRIDIUM DIFFICILE BY PCR     Status: None   Collection Time    03/30/13 12:18 PM      Result Value Range Status   C difficile by pcr NEGATIVE  NEGATIVE Final  Studies: Ct Abdomen Pelvis Wo Contrast  03/30/2013   CLINICAL DATA:  Diarrhea, nausea.  EXAM: CT ABDOMEN AND PELVIS WITHOUT CONTRAST  TECHNIQUE: Multidetector CT imaging of the abdomen and pelvis was performed following the standard protocol without intravenous contrast.  COMPARISON:  Abdominal ultrasound March 21, 2013.  FINDINGS: Large body habitus required a larger field of view, limiting the overall image quality.  Limited view of the lung bases are clear. Mitral annulus calcification, the heart is not enlarged.  The stomach, small and large bowel are normal in course and caliber. Contrast in the small bowel, has yet  to reach the large bowel. No definite and inflammatory changes.  The liver, spleen, gallbladder, pancreas and adrenal glands are grossly normal for this technically limited nonenhanced examination. Please note, possible cirrhosis suggested on recent ultrasound would not be well assessed on this examination.  Kidneys are normal in size, no nephrolithiasis or hydronephrosis. Great vessels are normal in course and caliber with mild calcific atherosclerosis. Urinary bladder is unremarkable. Prostate is not enlarged. No intraperitoneal free fluid nor free air. Degenerative change of the lumbar spine with suspected moderate to severe canal stenosis at L2-3.  IMPRESSION: Habitus limited examination with no definite acute intra-abdominal pelvic process.   Electronically Signed   By: Awilda Metro   On: 03/30/2013 05:23   US Renal  03/30/2013   CLINICAL DATA:  Acute kidney injury  EXAM: RENAL/URINARY TRACT ULTRASOUND COMPLETE  COMPARISON:  03/30/2013  FINDINGS: Right Kidney  Length: 13.6 cm. Echogenicity within normal limits. No mass or hydronephrosis visualized.  Left Kidney  Length: 13.8 cm. Echogenicity within normal limits. No mass or hydronephrosis visualized.  Bladder  Appears normal for degree of bladder distention.  IMPRESSION: 1. Normal exam.   Electronically Signed   By: Signa Kell M.D.   On: 03/30/2013 15:20    Scheduled Meds: . aspirin  325 mg Oral Daily  . atorvastatin  40 mg Oral q1800  . brinzolamide  1 drop Both Eyes BID  . febuxostat  80 mg Oral Daily  . gabapentin  600 mg Oral TID  . heparin  5,000 Units Subcutaneous Q8H  . insulin aspart  0-15 Units Subcutaneous TID WC  . insulin glargine  35 Units Subcutaneous BID  . latanoprost  1 drop Left Eye QHS  . niacin  1,000 mg Oral QHS  . pantoprazole  40 mg Oral BID  . prednisoLONE acetate  1 drop Right Eye QHS  . sodium chloride  3 mL Intravenous Q12H  . timolol  1 drop Both Eyes BID  . vitamin B-12  1,000 mcg Oral Daily    Continuous Infusions: . sodium chloride 100 mL/hr at 03/30/13 1610    Principal Problem:   SIRS (systemic inflammatory response syndrome) Active Problems:   IDDM (insulin dependent diabetes mellitus)   OSA (obstructive sleep apnea)   Diastolic CHF, chronic   HTN (hypertension)   HLD (hyperlipidemia)   Morbid obesity   Renal failure, acute on chronic   Metabolic acidosis   Diarrhea    Time spent: 25 minutes.     REGALADO,BELKYS  [facility] Pager [phone]. If 7PM-7AM, please contact night-coverage at [email or web address], password [facility] 03/31/2013, 7:37 AM  LOS: 2 days

## 2013-03-31 NOTE — Consult Note (Signed)
PULMONARY  / CRITICAL CARE MEDICINE  Name: Alexander Hubbard MRN: [id number] DOB: 02-17-1956    ADMISSION DATE:  03/29/2013 CONSULTATION DATE:  03/31/13  REFERRING MD : Dr. Sunnie Nielsen PRIMARY SERVICE:  [facility]  CHIEF COMPLAINT:  Diarrhea x 7 days  BRIEF PATIENT DESCRIPTION: 57 y.o. Morbidly obese male with PMH of CHF, HTN, HL, OSA, and insulin dependent DM presented to [facility] for 6 days of diarrhea.  Patient was found to have low BP, metabolic acidosis, and AKI.     SIGNIFICANT EVENTS / STUDIES:  03/29/13 Admitted by [facility]  LINES / TUBES: PIV  CULTURES: 03/30/13 Blood x2>> negative 03/30/13 C. Diff>> negative  ANTIBIOTICS:   HISTORY OF PRESENT ILLNESS:  57 y.o. Morbidly obese male with PMH of CHF, HTN, HL, OSA, and insulin dependent DM presented to [facility] for 6 days of diarrhea.  Patient was found to have low BP, metabolic acidosis, and AKI.  Patient was fluid resuscitated with 2L NS.  Patient found to be anemic, hyperkalemic, and to have an anion gap acidosis with concurrent respiratory acidosis.  Patient continues to be very acidotic and encephalopathic.  CCM asked to consult based on altered mental status and worsening respiratory acidosis.  PAST MEDICAL HISTORY :  Past Medical History  Diagnosis Date  . Diabetes mellitus   . Hypertension   . Obesity   . Sleep apnea   . Gouty arthritis    Past Surgical History  Procedure Laterality Date  . Eye surgery    . Knee surgery     Prior to Admission medications   Medication Sig Start Date End Date Taking? Authorizing Provider  aspirin 325 MG tablet Take 325 mg by mouth daily.   Yes Historical Provider, MD  brinzolamide (AZOPT) 1 % ophthalmic suspension Place 1 drop into both eyes 2 (two) times daily.     Yes Historical Provider, MD  carvedilol (COREG) 12.5 MG tablet Take 12.5 mg by mouth 2 (two) times daily with a meal.   Yes Historical Provider, MD  colchicine 0.6 MG tablet Take 0.6 mg by mouth daily.   Yes Historical Provider, MD   febuxostat (ULORIC) 40 MG tablet Take 80 mg by mouth daily.   Yes Historical Provider, MD  furosemide (LASIX) 40 MG tablet Take 40 mg by mouth 2 (two) times daily.   Yes Historical Provider, MD  gabapentin (NEURONTIN) 300 MG capsule Take 2 capsules (600 mg total) by mouth 3 (three) times daily. 02/17/13  Yes Donita Brooks, MD  insulin glargine (LANTUS) 100 UNIT/ML injection Inject 45-56 Units into the skin 2 (two) times daily. 45 units QAM    65 units QPM   Yes Historical Provider, MD  insulin regular human CONCENTRATED (HUMULIN R) 500 UNIT/ML SOLN injection Inject 6-10 Units into the skin 3 (three) times daily with meals. Inject 8 units in AM, 6 units at Lunch, and 10 units at dinner.   Yes Historical Provider, MD  latanoprost (XALATAN) 0.005 % ophthalmic solution Place 1 drop into the left eye at bedtime.    Yes Historical Provider, MD  Liraglutide (VICTOZA) 18 MG/3ML SOLN Inject 1.8 mg into the skin every morning.    Yes Historical Provider, MD  lisinopril (PRINIVIL,ZESTRIL) 10 MG tablet Take 10 mg by mouth daily.   Yes Historical Provider, MD  niacin (NIASPAN) 1000 MG CR tablet Take 1,000 mg by mouth at bedtime.   Yes Historical Provider, MD  pantoprazole (PROTONIX) 40 MG tablet Take 1 tablet (40 mg total) by mouth 2 (two) times  daily. 03/07/13  Yes Donita Brooks, MD  prednisoLONE acetate (PRED FORTE) 1 % ophthalmic suspension Place 1 drop into the right eye at bedtime.    Yes Historical Provider, MD  rosuvastatin (CRESTOR) 20 MG tablet Take 20 mg by mouth daily.   Yes Historical Provider, MD  timolol (TIMOPTIC) 0.5 % ophthalmic solution Place 1 drop into both eyes 2 (two) times daily.   Yes Historical Provider, MD  traMADol-acetaminophen (ULTRACET) 37.5-325 MG per tablet Take 1 tablet by mouth every 6 (six) hours as needed for pain.   Yes Historical Provider, MD  vitamin B-12 (CYANOCOBALAMIN) 1000 MCG tablet Take 1,000 mcg by mouth daily.     Yes Historical Provider, MD  Insulin  Syringe-Needle U-100 (B-D INS SYR ULTRAFINE .3CC/30G) 30G X 1/2" 0.3 ML MISC USE AS DIRECTED 3 TIMES A DAY. DX CODE: 250.00 01/05/13   Reather Littler, MD   Allergies  Allergen Reactions  . Penicillins Swelling and Rash    FAMILY HISTORY:  Family History  Problem Relation Age of Onset  . Hypertension Sister   . Diabetes Mellitus II Sister    SOCIAL HISTORY:  reports that he has never smoked. He has never used smokeless tobacco. He reports that he does not drink alcohol or use illicit drugs.  REVIEW OF SYSTEMS:   See HPI  SUBJECTIVE:   VITAL SIGNS: Temp:  [98.7 F (37.1 C)-99.5 F (37.5 C)] 99.5 F (37.5 C) (10/30 1211) Pulse Rate:  [98-104] 102 (10/30 1330) Resp:  [12-18] 12 (10/30 1330) BP: (93-138)/(45-71) 119/71 mmHg (10/30 1330) SpO2:  [95 %-98 %] 98 % (10/30 1330) Weight:  [412 lb 4.8 oz (187.018 kg)] 412 lb 4.8 oz (187.018 kg) (10/30 0852)  PHYSICAL EXAMINATION: General:  Morbidly obese male resting comfortably on bipap Neuro:  A&Ox3, moves extremities, appropriate conversation HEENT:  NCAT, PERLA, MM moist Neck:  No visible JVD Cardiovascular:  RRR, distant heart sounds Lungs:  Distant breath sounds, no obvious rhonchi, wheezing, rales Abdomen:  +BS, soft nontender Musculoskeletal:  No gross deformities, 1+ edema Skin:  No obvious breakdowns noted   Recent Labs Lab 03/29/13 2325 03/30/13 1830 03/31/13 0450  NA 135 133* 135  K 5.4* 6.1* 5.7*  CL 103 105 106  CO2 15* 14* 17*  BUN 89* 87* 86*  CREATININE 4.46* 3.97* 4.14*  GLUCOSE 76 162* 134*    Recent Labs Lab 03/29/13 2325 03/31/13 0450  HGB 9.5* 8.9*  HCT 28.0* 27.5*  WBC 11.8* 10.9*  PLT 185 177   Ct Abdomen Pelvis Wo Contrast  03/30/2013   CLINICAL DATA:  Diarrhea, nausea.  EXAM: CT ABDOMEN AND PELVIS WITHOUT CONTRAST  TECHNIQUE: Multidetector CT imaging of the abdomen and pelvis was performed following the standard protocol without intravenous contrast.  COMPARISON:  Abdominal ultrasound  March 21, 2013.  FINDINGS: Large body habitus required a larger field of view, limiting the overall image quality.  Limited view of the lung bases are clear. Mitral annulus calcification, the heart is not enlarged.  The stomach, small and large bowel are normal in course and caliber. Contrast in the small bowel, has yet to reach the large bowel. No definite and inflammatory changes.  The liver, spleen, gallbladder, pancreas and adrenal glands are grossly normal for this technically limited nonenhanced examination. Please note, possible cirrhosis suggested on recent ultrasound would not be well assessed on this examination.  Kidneys are normal in size, no nephrolithiasis or hydronephrosis. Great vessels are normal in course and caliber with mild calcific atherosclerosis.  Urinary bladder is unremarkable. Prostate is not enlarged. No intraperitoneal free fluid nor free air. Degenerative change of the lumbar spine with suspected moderate to severe canal stenosis at L2-3.  IMPRESSION: Habitus limited examination with no definite acute intra-abdominal pelvic process.   Electronically Signed   By: Awilda Metro   On: 03/30/2013 05:23   US Renal  03/30/2013   CLINICAL DATA:  Acute kidney injury  EXAM: RENAL/URINARY TRACT ULTRASOUND COMPLETE  COMPARISON:  03/30/2013  FINDINGS: Right Kidney  Length: 13.6 cm. Echogenicity within normal limits. No mass or hydronephrosis visualized.  Left Kidney  Length: 13.8 cm. Echogenicity within normal limits. No mass or hydronephrosis visualized.  Bladder  Appears normal for degree of bladder distention.  IMPRESSION: 1. Normal exam.   Electronically Signed   By: Signa Kell M.D.   On: 03/30/2013 15:20    ASSESSMENT / PLAN: 1.  Respiratory acidosis - likely secondary to OHS and OSA.  Patient currently satting well and resting comfortably on Bipap.  Mental status is adequate and patient remains able to protect his airway at this time.  Will continue to uses Bipap prn daily  and then suggest use of bipap/cpap at night.  2.  Non gap Metabolic acidosis - continue to treat with bicarb gtt.    3.  Hyperkalemia - kayexelate dose given by [facility].  Continue to monitor with frequent Bmets.  4.  Sepsis - C. Diff culture pending.  Continue to hold BP meds.  NO ACE inhibitor at this time.  Continue fluids as ordered.    Carley Hammed Physician Assistant Student 03/31/13, 2:26 pm  Summary:  Patient is a morbidly obese 57 y.o. WM with PMH of OSA, OHS, CHF, HTN, HL, and insulin dependent DM.  Patient presented to [facility] with six day history of diarrhea.  During this time patient continued to take ACE inhibitor.  Patient was hypotensive on admit with worsening non-gap metabolic acidosis with concurrent respiratory acidosis and encephalopathy.  PCCM consulted for airway management.  Patient was placed on bipap for worsening respiratory status.  Sats have improved with Bipap and patient is comfortable.  Respiratory acidosis likely secondary to OHS/OSA.  Patient is currently able to protect his airway at this time and mental status is appropriate.  Non gap acidosis currently being treated with bicarb drip at this time.   Likely cause is ATN from ACE inhibitor during diarrhea.  Renal is consulting.  Continue to monitor frequent Bmets for hyperkalemia.        Patient seen and examined, agree with above note.  I dictated the care and orders written for this patient under my direction.  Alyson Reedy, MD [phone]

## 2013-04-01 ENCOUNTER — Inpatient Hospital Stay (HOSPITAL_COMMUNITY): Payer: Medicaid Other

## 2013-04-01 DIAGNOSIS — Z794 Long term (current) use of insulin: Secondary | ICD-10-CM

## 2013-04-01 DIAGNOSIS — E119 Type 2 diabetes mellitus without complications: Secondary | ICD-10-CM

## 2013-04-01 DIAGNOSIS — I10 Essential (primary) hypertension: Secondary | ICD-10-CM

## 2013-04-01 LAB — RENAL FUNCTION PANEL
Albumin: 2.9 g/dL — ABNORMAL LOW (ref 3.5–5.2)
CO2: 18 mEq/L — ABNORMAL LOW (ref 19–32)
Calcium: 8.4 mg/dL (ref 8.4–10.5)
Chloride: 106 mEq/L (ref 96–112)
Creatinine, Ser: 2.87 mg/dL — ABNORMAL HIGH (ref 0.50–1.35)
GFR calc Af Amer: 26 mL/min — ABNORMAL LOW (ref >90)
GFR calc non Af Amer: 23 mL/min — ABNORMAL LOW (ref >90)
Glucose, Bld: 203 mg/dL — ABNORMAL HIGH (ref 70–99)
Potassium: 5.3 mEq/L — ABNORMAL HIGH (ref 3.5–5.1)
Sodium: 136 mEq/L (ref 135–145)

## 2013-04-01 LAB — CBC
HCT: 27.8 % — ABNORMAL LOW (ref 39.0–52.0)
MCH: 27.2 pg (ref 26.0–34.0)
MCV: 81.3 fL (ref 78.0–100.0)
Platelets: 142 10*3/uL — ABNORMAL LOW (ref 150–400)
RBC: 3.42 MIL/uL — ABNORMAL LOW (ref 4.22–5.81)
RDW: 14.2 % (ref 11.5–15.5)
WBC: 6 10*3/uL (ref 4.0–10.5)

## 2013-04-01 LAB — GLUCOSE, CAPILLARY
Glucose-Capillary: 176 mg/dL — ABNORMAL HIGH (ref 70–99)
Glucose-Capillary: 186 mg/dL — ABNORMAL HIGH (ref 70–99)
Glucose-Capillary: 175 mg/dL — ABNORMAL HIGH (ref 70–99)
Glucose-Capillary: 217 mg/dL — ABNORMAL HIGH (ref 70–99)

## 2013-04-01 MED ORDER — CARVEDILOL 12.5 MG PO TABS
12.5000 mg | ORAL_TABLET | Freq: Two times a day (BID) | ORAL | Status: DC
  Administered 2013-04-01 – 2013-04-03 (×4): 12.5 mg via ORAL
  Filled 2013-04-01 (×6): qty 1

## 2013-04-01 MED ORDER — INSULIN GLARGINE 100 UNIT/ML ~~LOC~~ SOLN
15.0000 [IU] | Freq: Every day | SUBCUTANEOUS | Status: DC
  Administered 2013-04-01: 15 [IU] via SUBCUTANEOUS
  Filled 2013-04-01 (×2): qty 0.15

## 2013-04-01 NOTE — Progress Notes (Signed)
Assessment/Plan: 57 year old white male with multiple medical issues including mild CKD at baseline. He now presents with acute on chronic renal failure in the setting of diarrhea/hypotension on an ACE inhibitor.  1.Renal- acute on chronic renal failure in the setting of above. Urinalysis consistent with ATN. Renal ultrasound is unremarkable. He is nonoliguric at this time.  2. Hypertension/volume -  3. metabolic acidosis - due to AKI. It is improving with sodium bicarbonate repletion. We'll continue this.   Plan: Cont IVF and holding ACE   Subjective: Interval History: Better  Objective: Vital signs in last 24 hours: Temp:  [97.8 F (36.6 C)-99.5 F (37.5 C)] 98.3 F (36.8 C) (10/31 1106) Pulse Rate:  [81-103] 83 (10/31 1106) Resp:  [12-20] 20 (10/31 1106) BP: (93-172)/(32-76) 161/66 mmHg (10/31 1106) SpO2:  [95 %-100 %] 98 % (10/31 1106) FiO2 (%):  [30 %] 30 % (10/30 2310) Weight:  [183.9 kg (405 lb 6.8 oz)-184.2 kg (406 lb 1.4 oz)] 184.2 kg (406 lb 1.4 oz) (10/31 1106) Weight change: 4.853 kg (10 lb 11.2 oz)  Intake/Output from previous day: 10/30 0701 - 10/31 0700 In: 2540 [P.O.:640; I.V.:1900] Out: 3300 [Urine:3300] Intake/Output this shift: Total I/O In: 500 [P.O.:300; I.V.:200] Out: -   General appearance: alert, cooperative and obese Resp: clear to auscultation bilaterally Cardio: regular rate and rhythm, S1, S2 normal, no murmur, click, rub or gallop GI: protuberant Extremities: edema 2+  Lab Results:  Recent Labs  03/31/13 0450 04/01/13 0550  WBC 10.9* 6.0  HGB 8.9* 9.3*  HCT 27.5* 27.8*  PLT 177 142*   BMET:  Recent Labs  03/31/13 1835 04/01/13 0550  NA 135 136  K 5.5* 5.3*  CL 105 106  CO2 17* 18*  GLUCOSE 166* 203*  BUN 78* 69*  CREATININE 3.59* 2.87*  CALCIUM 8.1* 8.4   No results found for this basename: PTH,  in the last 72 hours Iron Studies:  Recent Labs  03/30/13 1024  IRON 22*  TIBC 288   Studies/Results: Ct Head Wo  Contrast  04/01/2013   CLINICAL DATA:  Headache. History of diabetes and renal failure. Hypertension.  EXAM: CT HEAD WITHOUT CONTRAST  TECHNIQUE: Contiguous axial images were obtained from the base of the skull through the vertex without intravenous contrast.  COMPARISON:  MR angiography 12/25/2011. Previous head CT 03/12/2008.  FINDINGS: The brain shows mild generalized atrophy. There is no evidence of acute infarction. There are minor changes of low density in the hemispheric deep white matter suggesting chronic small vessel disease. No cortical or large vessel territory insult. No mass lesion, hemorrhage, hydrocephalus or extra-axial collection. The calvarium is unremarkable. There is atherosclerotic calcification of the major vessels at the base of the brain. There is mild mucosal thickening in the sphenoid sinus an along the floors of the maxillary sinuses.  IMPRESSION: No acute intracranial finding. Mild chronic appearing small vessel change of the hemispheric deep white matter.  Mild mucosal thickening of the maxillary and sphenoid sinuses without evidence of advanced sinus inflammation.   Electronically Signed   By: Paulina Fusi M.D.   On: 04/01/2013 09:41   US Renal  03/30/2013   CLINICAL DATA:  Acute kidney injury  EXAM: RENAL/URINARY TRACT ULTRASOUND COMPLETE  COMPARISON:  03/30/2013  FINDINGS: Right Kidney  Length: 13.6 cm. Echogenicity within normal limits. No mass or hydronephrosis visualized.  Left Kidney  Length: 13.8 cm. Echogenicity within normal limits. No mass or hydronephrosis visualized.  Bladder  Appears normal for degree of bladder distention.  IMPRESSION: 1. Normal exam.   Electronically Signed   By: Signa Kell M.D.   On: 03/30/2013 15:20   Scheduled: . atorvastatin  40 mg Oral q1800  . brinzolamide  1 drop Both Eyes BID  . carvedilol  12.5 mg Oral BID WC  . febuxostat  80 mg Oral Daily  . ferric gluconate (FERRLECIT/NULECIT) IV  125 mg Intravenous Daily  . heparin  5,000  Units Subcutaneous Q8H  . insulin aspart  0-15 Units Subcutaneous TID WC  . insulin glargine  15 Units Subcutaneous QHS  . latanoprost  1 drop Left Eye QHS  . niacin  1,000 mg Oral QHS  . pantoprazole  40 mg Oral BID  . prednisoLONE acetate  1 drop Right Eye QHS  . sodium chloride  3 mL Intravenous Q12H  . timolol  1 drop Both Eyes BID  . vitamin B-12  1,000 mcg Oral Daily    Nashaly Dorantes C 04/01/2013,11:58 AM

## 2013-04-01 NOTE — Progress Notes (Signed)
[facility] PROGRESS NOTE  Alexander Hubbard WGN:[id number] DOB: [birth year] DOA: 03/29/2013 PCP: Leo Grosser, MD  Assessment/Plan: 1-Encephalopathy: multifactorial : acidosis, renal failure. Improved with BIPAP. Patient alert, awake, following command. Appreciate CCM.   2-Acute on CKD: Cr baseline 1.8 to 2. probably ATN in setting diarrhea, hypotension, ACE. Continue to hold ACE. Continue with IV bicarb gtt. For hyperkalemia received one dose of kayexalate 10-30. Will defer kayexalate to renal. Appreciate Dr Kathrene Bongo help. Renal function improving, cr has decrease to 2. 8.  3-Metabolic acidosis; Improving. Continue with bicarb gtt. Could be secondary to renal failure, diarrhea. Lactic acid at 1.7.  4-SIRS: Resolved.  presents with diarrhea, hypotension, tachycardia. Continue with IV fluids. Hypotension resolved.Continue to hold BP medications. blood culture no growth to date. Resume coreg.  5-Diarrhea; GI pathogen negative. C diff negative.  6-Diastolic HF: monitor for pulmonary edema.  7-Anemia; hb stable. No evidence of GI bleed.  8-Diabetes; resume low dose lantus.  Continue with SSI.  9-Headache: check CT head, non focal exam.   Code Status: Full Code.  Family Communication: care discussed with wife.  Disposition Plan: transfer to step down for close monitoring.    Consultants:  CCM  Neprhology  Procedures:  Renal US: normal exam.   Antibiotics:  none  HPI/Subjective: Patient awake, following command. More interactive that when I saw him yesterday.     Objective: Filed Vitals:   04/01/13 0700  BP:   Pulse: 95  Temp:   Resp:     Intake/Output Summary (Last 24 hours) at 04/01/13 0802 Last data filed at 04/01/13 0700  Gross per 24 hour  Intake   2540 ml  Output   3300 ml  Net   -760 ml   Filed Weights   03/30/13 1114 03/31/13 0852 04/01/13 0500  Weight: 182.165 kg (401 lb 9.6 oz) 187.018 kg (412 lb 4.8 oz) 183.9 kg (405 lb 6.8 oz)     Exam:   General:  Periorbital edema, Awake, alert.   Cardiovascular: S 1, S 2 RRR  Respiratory: decrease breath sound.   Abdomen: BS present, soft, NT  Musculoskeletal: positive edema  Data Reviewed: Basic Metabolic Panel:  Recent Labs Lab 03/29/13 2325 03/30/13 1830 03/31/13 0450 03/31/13 1835 04/01/13 0550  NA 135 133* 135 135 136  K 5.4* 6.1* 5.7* 5.5* 5.3*  CL 103 105 106 105 106  CO2 15* 14* 17* 17* 18*  GLUCOSE 76 162* 134* 166* 203*  BUN 89* 87* 86* 78* 69*  CREATININE 4.46* 3.97* 4.14* 3.59* 2.87*  CALCIUM 8.3* 7.8* 8.0* 8.1* 8.4  PHOS  --   --   --  4.4 4.0   Liver Function Tests:  Recent Labs Lab 03/29/13 2325 03/31/13 1835 04/01/13 0550  AST 13  --   --   ALT 10  --   --   ALKPHOS 43  --   --   BILITOT 0.2*  --   --   PROT 6.7  --   --   ALBUMIN 3.1* 3.0* 2.9*    Recent Labs Lab 03/30/13 1024  LIPASE 14   No results found for this basename: AMMONIA,  in the last 168 hours CBC:  Recent Labs Lab 03/29/13 2325 03/31/13 0450 04/01/13 0550  WBC 11.8* 10.9* 6.0  NEUTROABS 7.8*  --   --   HGB 9.5* 8.9* 9.3*  HCT 28.0* 27.5* 27.8*  MCV 81.2 84.1 81.3  PLT 185 177 142*   Cardiac Enzymes: No results found for this basename: CKTOTAL, CKMB,  CKMBINDEX, TROPONINI,  in the last 168 hours BNP (last 3 results)  Recent Labs  09/22/12 0927  PROBNP 225.2*   CBG:  Recent Labs Lab 03/31/13 0917 03/31/13 1121 03/31/13 1252 03/31/13 1701 03/31/13 2245  GLUCAP 146* 141* 128* 95 160*    Recent Results (from the past 240 hour(s))  CULTURE, BLOOD (ROUTINE X 2)     Status: None   Collection Time    03/30/13  8:48 AM      Result Value Range Status   Specimen Description BLOOD LEFT HAND   Final   Special Requests BOTTLES DRAWN AEROBIC ONLY 10CC   Final   Culture  Setup Time     Final   Value: 03/30/2013 16:21     Performed at [facility]   Culture     Final   Value:        BLOOD CULTURE RECEIVED NO GROWTH TO DATE CULTURE  WILL BE HELD FOR 5 DAYS BEFORE ISSUING A FINAL NEGATIVE REPORT     Performed at [facility]   Report Status PENDING   Incomplete  CULTURE, BLOOD (ROUTINE X 2)     Status: None   Collection Time    03/30/13 10:05 AM      Result Value Range Status   Specimen Description BLOOD RIGHT ARM   Final   Special Requests BOTTLES DRAWN AEROBIC ONLY 10CC   Final   Culture  Setup Time     Final   Value: 03/30/2013 17:36     Performed at [facility]   Culture     Final   Value:        BLOOD CULTURE RECEIVED NO GROWTH TO DATE CULTURE WILL BE HELD FOR 5 DAYS BEFORE ISSUING A FINAL NEGATIVE REPORT     Performed at [facility]   Report Status PENDING   Incomplete  CLOSTRIDIUM DIFFICILE BY PCR     Status: None   Collection Time    03/30/13 12:18 PM      Result Value Range Status   C difficile by pcr NEGATIVE  NEGATIVE Final  MRSA PCR SCREENING     Status: None   Collection Time    03/31/13  1:38 PM      Result Value Range Status   MRSA by PCR NEGATIVE  NEGATIVE Final   Comment:            The GeneXpert MRSA Assay (FDA     approved for NASAL specimens     only), is one component of a     comprehensive MRSA colonization     surveillance program. It is not     intended to diagnose MRSA     infection nor to guide or     monitor treatment for     MRSA infections.     Studies: US Renal  03/30/2013   CLINICAL DATA:  Acute kidney injury  EXAM: RENAL/URINARY TRACT ULTRASOUND COMPLETE  COMPARISON:  03/30/2013  FINDINGS: Right Kidney  Length: 13.6 cm. Echogenicity within normal limits. No mass or hydronephrosis visualized.  Left Kidney  Length: 13.8 cm. Echogenicity within normal limits. No mass or hydronephrosis visualized.  Bladder  Appears normal for degree of bladder distention.  IMPRESSION: 1. Normal exam.   Electronically Signed   By: Signa Kell M.D.   On: 03/30/2013 15:20    Scheduled Meds: . atorvastatin  40 mg Oral q1800  . brinzolamide  1 drop Both Eyes BID  .  febuxostat  80 mg Oral Daily  . ferric gluconate (FERRLECIT/NULECIT) IV  125 mg Intravenous Daily  . heparin  5,000 Units Subcutaneous Q8H  . insulin aspart  0-15 Units Subcutaneous TID WC  . latanoprost  1 drop Left Eye QHS  . niacin  1,000 mg Oral QHS  . pantoprazole  40 mg Oral BID  . prednisoLONE acetate  1 drop Right Eye QHS  . sodium chloride  3 mL Intravenous Q12H  . timolol  1 drop Both Eyes BID  . vitamin B-12  1,000 mcg Oral Daily   Continuous Infusions: .  sodium bicarbonate 150 mEq in sterile water 1000 mL infusion 100 mL/hr at 03/31/13 2301    Principal Problem:   SIRS (systemic inflammatory response syndrome) Active Problems:   IDDM (insulin dependent diabetes mellitus)   OSA (obstructive sleep apnea)   Diastolic CHF, chronic   HTN (hypertension)   HLD (hyperlipidemia)   Morbid obesity   Renal failure, acute on chronic   Metabolic acidosis   Diarrhea   Acidosis   Hypoxemia    Time spent: 25 minutes.     Amandalynn Pitz  [facility] Pager [phone]. If 7PM-7AM, please contact night-coverage at [email or web address], password [facility] 04/01/2013, 8:02 AM  LOS: 3 days

## 2013-04-01 NOTE — Progress Notes (Addendum)
Pt c/o discomfort at IV site Lt wrist. Site assessed looked good retaped. IV is leaking at site - pt has anasarca and call made to IV team after determining that there are no visible Iv sites  attempt.

## 2013-04-01 NOTE — Evaluation (Signed)
Physical Therapy Evaluation Patient Details Name: Alexander Hubbard MRN: [id number] DOB: [birth year] Today's Date: 04/01/2013 Time: 3086-5784 PT Time Calculation (min): 19 min  PT Assessment / Plan / Recommendation History of Present Illness  56 y.o. male admitted to [facility] on 03/29/13 with diastolic CHF, HTN, HL, OSA, gout, DM on insulin presented with watery diarrhea for 6 days .  The dehydration sent him into acute on chronic renal failure.  He also has been dx with respiratory acidosis.    Clinical Impression  Pt is doing well despite quite a bit of medical issues.  He is needing a more stable assistive device and would benefit from home therapy f/u to ensure monitored activity progression and safety at home.  He has great family support, so I do not believe his mobility status will keep him here.  He needs to practice the stairs ASAP as he is hopeful to d/c soon.      PT Assessment  Patient needs continued PT services    Follow Up Recommendations  Home health PT;Supervision for mobility/OOB    Does the patient have the potential to tolerate intense rehabilitation     NA  Barriers to Discharge   None      Equipment Recommendations  Rolling walker with 5" wheels;Other (comment) (bariatirc RW)    Recommendations for Other Services   None  Frequency Min 3X/week    Precautions / Restrictions Precautions Precautions: Fall Precaution Comments: generally unsteady on his feet.     Pertinent Vitals/Pain O2 sat 95-96% on RA, DOE 2/4 with gait.  Left off O2, RN aware.        Mobility  Bed Mobility Bed Mobility: Not assessed (pt seated EOB.  ) Transfers Transfers: Sit to Stand;Stand to Sit Sit to Stand: 4: Min assist;From elevated surface;With upper extremity assist;From bed;Other (comment) (using momentum to get to standing. ) Stand to Sit: 5: Supervision;With upper extremity assist;With armrests;To bed;To chair/3-in-1 (uncontrolled "plop" to sit) Details for Transfer Assistance: Assist  needed to support trunk for balance and to give pt a boost up to standing.  He stood twice during the session and the second time was much better.   Ambulation/Gait Ambulation/Gait Assistance: 4: Min assist Ambulation Distance (Feet): 70 Feet Assistive device: 1 person hand held assist;Straight cane Ambulation/Gait Assistance Details: min hand held assist in free hand with cane in right hand.  Pt needing both hands for support.  His gait got more steady as he went and 2/4 DOE.  O2 sats stayed in the mid 90s during gait.   Gait Pattern: Step-through pattern;Wide base of support Gait velocity: decreased        PT Diagnosis: Difficulty walking;Abnormality of gait;Generalized weakness  PT Problem List: Decreased strength;Decreased activity tolerance;Decreased balance;Decreased mobility;Decreased knowledge of use of DME;Cardiopulmonary status limiting activity;Obesity PT Treatment Interventions: DME instruction;Gait training;Stair training;Functional mobility training;Therapeutic activities;Therapeutic exercise;Balance training;Neuromuscular re-education;Patient/family education     PT Goals(Current goals can be found in the care plan section) Acute Rehab PT Goals Patient Stated Goal: to go home ASAP PT Goal Formulation: With patient/family Time For Goal Achievement: 04/15/13 Potential to Achieve Goals: Good  Visit Information  Last PT Received On: 04/01/13 Assistance Needed: +1 History of Present Illness: 57 y.o. male admitted to [facility] on 03/29/13 with diastolic CHF, HTN, HL, OSA, gout, DM on insulin presented with watery diarrhea for 6 days .  The dehydration sent him into acute on chronic renal failure.  He also has been dx with respiratory acidosis.  Prior Functioning  Home Living Family/patient expects to be discharged to:: Private residence Living Arrangements: Spouse/significant other;Children;Other relatives Available Help at Discharge: Available 24 hours/day;Family Type of  Home: House Home Access: Stairs to enter Entrance Stairs-Number of Steps: 4 Entrance Stairs-Rails: Right Home Layout: One level Home Equipment: Cane - single point Additional Comments: pt is interested in [place] RW Prior Function Level of Independence: Independent with assistive device(s) Communication Communication: No difficulties Dominant Hand: Right    Cognition  Cognition Arousal/Alertness: Awake/alert Behavior During Therapy: WFL for tasks assessed/performed Overall Cognitive Status: Within Functional Limits for tasks assessed    Extremity/Trunk Assessment Upper Extremity Assessment Upper Extremity Assessment: Overall WFL for tasks assessed Lower Extremity Assessment Lower Extremity Assessment: Generalized weakness Cervical / Trunk Assessment Cervical / Trunk Assessment: Normal      End of Session PT - End of Session Activity Tolerance: Patient limited by fatigue Patient left: in chair;with call bell/phone within reach;with family/visitor present Nurse Communication: Mobility status;Other (comment) (left O2 off)    Alyissa Whidbee B. Taeler Winning, PT, DPT [phone]   04/01/2013, 4:56 PM

## 2013-04-01 NOTE — Progress Notes (Signed)
PULMONARY / CRITICAL CARE MEDICINE  Name: Alexander Hubbard  MRN: [id number]  DOB: Aug 05, 1955   ADMISSION DATE: 03/29/2013   CONSULTATION DATE: 03/31/13   REFERRING MD : Dr. Sunnie Nielsen  PRIMARY SERVICE: [facility]   CHIEF COMPLAINT: Diarrhea x 7 days   BRIEF PATIENT DESCRIPTION: 57 y.o. Morbidly obese male with PMH of CHF, HTN, HL, OSA, and insulin dependent DM presented to [facility] for 6 days of diarrhea. Patient was found to have low BP, metabolic acidosis, and AKI.   SIGNIFICANT EVENTS / STUDIES:  03/29/13 Admitted by [facility]   LINES / TUBES:  PIV   CULTURES:  03/30/13 Blood x2>> negative  03/30/13 C. Diff>> negative   ANTIBIOTICS:   HISTORY OF PRESENT ILLNESS: 57 y.o. Morbidly obese male with PMH of CHF, HTN, HL, OSA, and insulin dependent DM presented to [facility] for 6 days of diarrhea. Patient was found to have low BP, metabolic acidosis, and AKI. Patient was fluid resuscitated with 2L NS. Patient found to be anemic, hyperkalemic, and to have an anion gap acidosis with concurrent respiratory acidosis. Patient continues to be very acidotic and encephalopathic. CCM asked to consult based on altered mental status and worsening respiratory acidosis.   SUBJECTIVE: Patient resting comfortably in bed.    VITAL SIGNS:  Temp: [98.7 F (37.1 C)-99.5 F (37.5 C)] 99.5 F (37.5 C) (10/30 1211)  Pulse Rate: [98-104] 102 (10/30 1330)  Resp: [12-18] 12 (10/30 1330)  BP: (93-138)/(45-71) 119/71 mmHg (10/30 1330)  SpO2: [95 %-98 %] 98 % (10/30 1330)  Weight: [412 lb 4.8 oz (187.018 kg)] 412 lb 4.8 oz (187.018 kg) (10/30 0852)   PHYSICAL EXAMINATION:  General: Morbidly obese male resting comfortably on bipap  Neuro: A&Ox3, moves extremities, appropriate conversation  HEENT: NCAT, PERLA, MM moist  Neck: No visible JVD  Cardiovascular: RRR, distant heart sounds  Lungs: Distant breath sounds, no obvious rhonchi, wheezing, rales  Abdomen: +BS, soft nontender  Musculoskeletal: No gross deformities, 1+  edema  Skin: No obvious breakdowns noted   Recent Labs  Lab  03/29/13 2325  03/30/13 1830  03/31/13 0450   NA  135  133*  135   K  5.4*  6.1*  5.7*   CL  103  105  106   CO2  15*  14*  17*   BUN  89*  87*  86*   CREATININE  4.46*  3.97*  4.14*   GLUCOSE  76  162*  134*     Recent Labs  Lab  03/29/13 2325  03/31/13 0450   HGB  9.5*  8.9*   HCT  28.0*  27.5*   WBC  11.8*  10.9*   PLT  185  177    Ct Abdomen Pelvis Wo Contrast  03/30/2013 CLINICAL DATA: Diarrhea, nausea. EXAM: CT ABDOMEN AND PELVIS WITHOUT CONTRAST TECHNIQUE: Multidetector CT imaging of the abdomen and pelvis was performed following the standard protocol without intravenous contrast. COMPARISON: Abdominal ultrasound March 21, 2013. FINDINGS: Large body habitus required a larger field of view, limiting the overall image quality. Limited view of the lung bases are clear. Mitral annulus calcification, the heart is not enlarged. The stomach, small and large bowel are normal in course and caliber. Contrast in the small bowel, has yet to reach the large bowel. No definite and inflammatory changes. The liver, spleen, gallbladder, pancreas and adrenal glands are grossly normal for this technically limited nonenhanced examination. Please note, possible cirrhosis suggested on recent ultrasound would not be well assessed on this  examination. Kidneys are normal in size, no nephrolithiasis or hydronephrosis. Great vessels are normal in course and caliber with mild calcific atherosclerosis. Urinary bladder is unremarkable. Prostate is not enlarged. No intraperitoneal free fluid nor free air. Degenerative change of the lumbar spine with suspected moderate to severe canal stenosis at L2-3. IMPRESSION: Habitus limited examination with no definite acute intra-abdominal pelvic process. Electronically Signed By: Awilda Metro On: 03/30/2013 05:23   US Renal  03/30/2013 CLINICAL DATA: Acute kidney injury EXAM: RENAL/URINARY TRACT  ULTRASOUND COMPLETE COMPARISON: 03/30/2013 FINDINGS: Right Kidney Length: 13.6 cm. Echogenicity within normal limits. No mass or hydronephrosis visualized. Left Kidney Length: 13.8 cm. Echogenicity within normal limits. No mass or hydronephrosis visualized. Bladder Appears normal for degree of bladder distention. IMPRESSION: 1. Normal exam. Electronically Signed By: Signa Kell M.D. On: 03/30/2013 15:20   ASSESSMENT / PLAN:  1. Respiratory acidosis - likely secondary to OHS and OSA. Patient currently satting well and resting comfortably on Bipap. Mental status is adequate and patient remains able to protect his airway at this time. Will continue to use Bipap prn daily and then suggest use of bipap/cpap at night.  Will also need a sleep study as outpatient.  2. Non gap Metabolic acidosis due to renal failure - continue to treat with bicarb gtt.   3. Hyperkalemia - kayexelate dose given by [facility] 10/30. Continue to monitor with frequent Bmets and would recommend another dose of kayexalate with BMET in AM.   4. Sepsis - C. Diff culture negative. Continue to hold BP meds. NO ACE inhibitor at this time. Continue fluids as ordered.   Carley Hammed Physician Assistant Student  03/31/13, 2:26 pm   Summary: Patient significantly improved with Bipap use.  Resting comfortably today with O2 sats acceptable on [place].  Recommend mandatory  QHS use of bipap as this is largely related to both OSA/OHS.  Patient stable at this time.  PCCM will sign off.  Please contact if further assistance is required.  Patient seen and examined, agree with above note.  I dictated the care and orders written for this patient under my direction.  Alyson Reedy, MD [phone]

## 2013-04-01 NOTE — Progress Notes (Signed)
Pt arrived as a transfer from ICU per w/c. Oriented to unit and room. Knows how to use nurse call system. VS done and placed on telemetry monitor. Wife accompanied pt.

## 2013-04-02 LAB — RENAL FUNCTION PANEL
Albumin: 3.1 g/dL — ABNORMAL LOW (ref 3.5–5.2)
BUN: 53 mg/dL — ABNORMAL HIGH (ref 6–23)
Calcium: 8.7 mg/dL (ref 8.4–10.5)
Chloride: 109 mEq/L (ref 96–112)
Creatinine, Ser: 2.17 mg/dL — ABNORMAL HIGH (ref 0.50–1.35)
Phosphorus: 3.2 mg/dL (ref 2.3–4.6)
Potassium: 5.4 mEq/L — ABNORMAL HIGH (ref 3.5–5.1)

## 2013-04-02 LAB — GLUCOSE, CAPILLARY
Glucose-Capillary: 216 mg/dL — ABNORMAL HIGH (ref 70–99)
Glucose-Capillary: 229 mg/dL — ABNORMAL HIGH (ref 70–99)

## 2013-04-02 LAB — POTASSIUM: Potassium: 5 mEq/L (ref 3.5–5.1)

## 2013-04-02 MED ORDER — INSULIN GLARGINE 100 UNIT/ML ~~LOC~~ SOLN
25.0000 [IU] | Freq: Every day | SUBCUTANEOUS | Status: DC
  Administered 2013-04-02: 25 [IU] via SUBCUTANEOUS
  Filled 2013-04-02 (×2): qty 0.25

## 2013-04-02 NOTE — Progress Notes (Signed)
[facility] PROGRESS NOTE  Alexander Hubbard QQV:[id number] DOB: May 20, 1956 DOA: 03/29/2013 PCP: Leo Grosser, MD  Assessment/Plan: 1-Encephalopathy: Resolved.  multifactorial : acidosis, renal failure. Improved with BIPAP. Patient alert, awake, following command. Appreciate CCM.   2-Acute on CKD: Cr baseline 1.8 to 2. probably ATN in setting diarrhea, hypotension, ACE. Continue to hold ACE. Received  IV bicarb gtt. Renal function improving, cr has decrease to 2. 1. Will repeat K level. Will resume lasix tomorrow.   3-Metabolic acidosis; resolved with bicarb gtt. Could be secondary to renal failure, diarrhea. Lactic acid at 1.7.   4-SIRS: Resolved.  presents with diarrhea, hypotension, tachycardia. Continue with IV fluids. Hypotension resolved.Continue to hold BP medications. blood culture no growth to date. Resume coreg.  5-Diarrhea; GI pathogen negative. C diff negative.  6-Diastolic HF: monitor for pulmonary edema.  7-Anemia; hb stable. No evidence of GI bleed.  8-Diabetes; Increase  lantus to 25 units.  Continue with SSI.  9-Headache:  CT head negative.  non focal exam.   Code Status: Full Code.  Family Communication: care discussed with wife.  Disposition Plan: plan to discharge home 11-02.   Consultants:  CCM  Neprhology  Procedures:  Renal US: normal exam.   Antibiotics:  none  HPI/Subjective: Feeling better. No headaches. No chest pain or dyspnea.   Objective: Filed Vitals:   04/02/13 1241  BP:   Pulse: 116  Temp:   Resp:     Intake/Output Summary (Last 24 hours) at 04/02/13 1340 Last data filed at 04/02/13 0900  Gross per 24 hour  Intake   1067 ml  Output   2000 ml  Net   -933 ml   Filed Weights   04/01/13 0500 04/01/13 1106 04/02/13 0528  Weight: 183.9 kg (405 lb 6.8 oz) 184.2 kg (406 lb 1.4 oz) 183.4 kg (404 lb 5.2 oz)    Exam:   General:  Periorbital edema, Awake, alert.   Cardiovascular: S 1, S 2 RRR  Respiratory: decrease  breath sound.   Abdomen: BS present, soft, NT  Musculoskeletal: positive edema  Data Reviewed: Basic Metabolic Panel:  Recent Labs Lab 03/30/13 1830 03/31/13 0450 03/31/13 1835 04/01/13 0550 04/02/13 0400  NA 133* 135 135 136 143  K 6.1* 5.7* 5.5* 5.3* 5.4*  CL 105 106 105 106 109  CO2 14* 17* 17* 18* 24  GLUCOSE 162* 134* 166* 203* 181*  BUN 87* 86* 78* 69* 53*  CREATININE 3.97* 4.14* 3.59* 2.87* 2.17*  CALCIUM 7.8* 8.0* 8.1* 8.4 8.7  PHOS  --   --  4.4 4.0 3.2   Liver Function Tests:  Recent Labs Lab 03/29/13 2325 03/31/13 1835 04/01/13 0550 04/02/13 0400  AST 13  --   --   --   ALT 10  --   --   --   ALKPHOS 43  --   --   --   BILITOT 0.2*  --   --   --   PROT 6.7  --   --   --   ALBUMIN 3.1* 3.0* 2.9* 3.1*    Recent Labs Lab 03/30/13 1024  LIPASE 14   No results found for this basename: AMMONIA,  in the last 168 hours CBC:  Recent Labs Lab 03/29/13 2325 03/31/13 0450 04/01/13 0550  WBC 11.8* 10.9* 6.0  NEUTROABS 7.8*  --   --   HGB 9.5* 8.9* 9.3*  HCT 28.0* 27.5* 27.8*  MCV 81.2 84.1 81.3  PLT 185 177 142*   Cardiac Enzymes: No results  found for this basename: CKTOTAL, CKMB, CKMBINDEX, TROPONINI,  in the last 168 hours BNP (last 3 results)  Recent Labs  09/22/12 0927  PROBNP 225.2*   CBG:  Recent Labs Lab 04/01/13 1632 04/01/13 1905 04/01/13 2153 04/02/13 0612 04/02/13 1123  GLUCAP 175* 249* 217* 206* 216*    Recent Results (from the past 240 hour(s))  CULTURE, BLOOD (ROUTINE X 2)     Status: None   Collection Time    03/30/13  8:48 AM      Result Value Range Status   Specimen Description BLOOD LEFT HAND   Final   Special Requests BOTTLES DRAWN AEROBIC ONLY 10CC   Final   Culture  Setup Time     Final   Value: 03/30/2013 16:21     Performed at [facility]   Culture     Final   Value:        BLOOD CULTURE RECEIVED NO GROWTH TO DATE CULTURE WILL BE HELD FOR 5 DAYS BEFORE ISSUING A FINAL NEGATIVE REPORT      Performed at [facility]   Report Status PENDING   Incomplete  CULTURE, BLOOD (ROUTINE X 2)     Status: None   Collection Time    03/30/13 10:05 AM      Result Value Range Status   Specimen Description BLOOD RIGHT ARM   Final   Special Requests BOTTLES DRAWN AEROBIC ONLY 10CC   Final   Culture  Setup Time     Final   Value: 03/30/2013 17:36     Performed at [facility]   Culture     Final   Value:        BLOOD CULTURE RECEIVED NO GROWTH TO DATE CULTURE WILL BE HELD FOR 5 DAYS BEFORE ISSUING A FINAL NEGATIVE REPORT     Performed at [facility]   Report Status PENDING   Incomplete  CLOSTRIDIUM DIFFICILE BY PCR     Status: None   Collection Time    03/30/13 12:18 PM      Result Value Range Status   C difficile by pcr NEGATIVE  NEGATIVE Final  MRSA PCR SCREENING     Status: None   Collection Time    03/31/13  1:38 PM      Result Value Range Status   MRSA by PCR NEGATIVE  NEGATIVE Final   Comment:            The GeneXpert MRSA Assay (FDA     approved for NASAL specimens     only), is one component of a     comprehensive MRSA colonization     surveillance program. It is not     intended to diagnose MRSA     infection nor to guide or     monitor treatment for     MRSA infections.     Studies: Ct Head Wo Contrast  04/01/2013   CLINICAL DATA:  Headache. History of diabetes and renal failure. Hypertension.  EXAM: CT HEAD WITHOUT CONTRAST  TECHNIQUE: Contiguous axial images were obtained from the base of the skull through the vertex without intravenous contrast.  COMPARISON:  MR angiography 12/25/2011. Previous head CT 03/12/2008.  FINDINGS: The brain shows mild generalized atrophy. There is no evidence of acute infarction. There are minor changes of low density in the hemispheric deep white matter suggesting chronic small vessel disease. No cortical or large vessel territory insult. No mass lesion, hemorrhage, hydrocephalus or extra-axial collection. The  calvarium is unremarkable. There is atherosclerotic calcification of the major vessels at the base of the brain. There is mild mucosal thickening in the sphenoid sinus an along the floors of the maxillary sinuses.  IMPRESSION: No acute intracranial finding. Mild chronic appearing small vessel change of the hemispheric deep white matter.  Mild mucosal thickening of the maxillary and sphenoid sinuses without evidence of advanced sinus inflammation.   Electronically Signed   By: Paulina Fusi M.D.   On: 04/01/2013 09:41    Scheduled Meds: . atorvastatin  40 mg Oral q1800  . brinzolamide  1 drop Both Eyes BID  . carvedilol  12.5 mg Oral BID WC  . febuxostat  80 mg Oral Daily  . heparin  5,000 Units Subcutaneous Q8H  . insulin aspart  0-15 Units Subcutaneous TID WC  . insulin glargine  25 Units Subcutaneous QHS  . latanoprost  1 drop Left Eye QHS  . niacin  1,000 mg Oral QHS  . pantoprazole  40 mg Oral BID  . prednisoLONE acetate  1 drop Right Eye QHS  . sodium chloride  3 mL Intravenous Q12H  . timolol  1 drop Both Eyes BID  . vitamin B-12  1,000 mcg Oral Daily   Continuous Infusions:    Principal Problem:   SIRS (systemic inflammatory response syndrome) Active Problems:   IDDM (insulin dependent diabetes mellitus)   OSA (obstructive sleep apnea)   Diastolic CHF, chronic   HTN (hypertension)   HLD (hyperlipidemia)   Morbid obesity   Renal failure, acute on chronic   Metabolic acidosis   Diarrhea   Acidosis   Hypoxemia    Time spent: 25 minutes.     Alexander Hubbard  [facility] Pager [phone]. If 7PM-7AM, please contact night-coverage at [email or web address], password [facility] 04/02/2013, 1:40 PM  LOS: 4 days

## 2013-04-02 NOTE — Progress Notes (Signed)
Assessment/Plan: 57 year old white male with multiple medical issues including mild CKD at baseline. He now presents with acute on chronic renal failure in the setting of diarrhea/hypotension on an ACE inhibitor.  1. Acute Kidney Injury, improved 2. CKD cr 1.8 8/14  3. Hypertension/volume -   Plan: Stop IVFs, Would keep off ACE-I during hospitalization and let his out pt MDs decide about further use.    Subjective: Interval History: feels fine   Objective: Vital signs in last 24 hours: Temp:  [97.4 F (36.3 C)-98.4 F (36.9 C)] 97.4 F (36.3 C) (11/01 0900) Pulse Rate:  [74-93] 81 (11/01 0900) Resp:  [18-19] 18 (11/01 0900) BP: (145-167)/(58-67) 167/58 mmHg (11/01 0900) SpO2:  [95 %-99 %] 96 % (11/01 0900) Weight:  [183.4 kg (404 lb 5.2 oz)] 183.4 kg (404 lb 5.2 oz) (11/01 0528) Weight change: -2.818 kg (-6 lb 3.4 oz)  Intake/Output from previous day: 10/31 0701 - 11/01 0700 In: 1567 [P.O.:1242; I.V.:200; IV Piggyback:125] Out: 1600 [Urine:1600] Intake/Output this shift: Total I/O In: 240 [P.O.:240] Out: 400 [Urine:400]  General appearance: alert and cooperative Resp: clear to auscultation bilaterally Chest wall: no tenderness GI: soft, non-tender; bowel sounds normal; no masses,  no organomegaly and protuberant Extremities: edema 1+  Lab Results:  Recent Labs  03/31/13 0450 04/01/13 0550  WBC 10.9* 6.0  HGB 8.9* 9.3*  HCT 27.5* 27.8*  PLT 177 142*   BMET:  Recent Labs  04/01/13 0550 04/02/13 0400  NA 136 143  K 5.3* 5.4*  CL 106 109  CO2 18* 24  GLUCOSE 203* 181*  BUN 69* 53*  CREATININE 2.87* 2.17*  CALCIUM 8.4 8.7   No results found for this basename: PTH,  in the last 72 hours Iron Studies: No results found for this basename: IRON, TIBC, TRANSFERRIN, FERRITIN,  in the last 72 hours Studies/Results: Ct Head Wo Contrast  04/01/2013   CLINICAL DATA:  Headache. History of diabetes and renal failure. Hypertension.  EXAM: CT HEAD WITHOUT CONTRAST   TECHNIQUE: Contiguous axial images were obtained from the base of the skull through the vertex without intravenous contrast.  COMPARISON:  MR angiography 12/25/2011. Previous head CT 03/12/2008.  FINDINGS: The brain shows mild generalized atrophy. There is no evidence of acute infarction. There are minor changes of low density in the hemispheric deep white matter suggesting chronic small vessel disease. No cortical or large vessel territory insult. No mass lesion, hemorrhage, hydrocephalus or extra-axial collection. The calvarium is unremarkable. There is atherosclerotic calcification of the major vessels at the base of the brain. There is mild mucosal thickening in the sphenoid sinus an along the floors of the maxillary sinuses.  IMPRESSION: No acute intracranial finding. Mild chronic appearing small vessel change of the hemispheric deep white matter.  Mild mucosal thickening of the maxillary and sphenoid sinuses without evidence of advanced sinus inflammation.   Electronically Signed   By: Paulina Fusi M.D.   On: 04/01/2013 09:41   Scheduled: . atorvastatin  40 mg Oral q1800  . brinzolamide  1 drop Both Eyes BID  . carvedilol  12.5 mg Oral BID WC  . febuxostat  80 mg Oral Daily  . heparin  5,000 Units Subcutaneous Q8H  . insulin aspart  0-15 Units Subcutaneous TID WC  . insulin glargine  15 Units Subcutaneous QHS  . latanoprost  1 drop Left Eye QHS  . niacin  1,000 mg Oral QHS  . pantoprazole  40 mg Oral BID  . prednisoLONE acetate  1 drop Right  Eye QHS  . sodium chloride  3 mL Intravenous Q12H  . timolol  1 drop Both Eyes BID  . vitamin B-12  1,000 mcg Oral Daily     LOS: 4 days   Jahvon Gosline C 04/02/2013,11:27 AM

## 2013-04-02 NOTE — Progress Notes (Signed)
Physical Therapy Treatment Patient Details Name: Alexander Hubbard MRN: [id number] DOB: [birth year] Today's Date: 04/02/2013 Time: 4098-1191 PT Time Calculation (min): 24 min  PT Assessment / Plan / Recommendation  History of Present Illness 57 y.o. male admitted to [facility] on 03/29/13 with diastolic CHF, HTN, HL, OSA, gout, DM on insulin presented with watery diarrhea for 6 days .  The dehydration sent him into acute on chronic renal failure.  He also has been dx with respiratory acidosis.     PT Comments   Pt progressing with mobility and able to ascend and descend stairs with increased fatigue. Pt sats remained 92-96% on Ra throughout with cueing for posture, breathing technique. Pt encouraged to increase ambulation and HEP at home to increased mobility and function. Will continue to follow acutely  Follow Up Recommendations  Home health PT;Supervision for mobility/OOB     Does the patient have the potential to tolerate intense rehabilitation     Barriers to Discharge        Equipment Recommendations  Rolling walker with 5" wheels;Other (comment) (bari RW)    Recommendations for Other Services    Frequency     Progress towards PT Goals Progress towards PT goals: Progressing toward goals  Plan Current plan remains appropriate    Precautions / Restrictions Precautions Precautions: Fall Precaution Comments: generally unsteady on his feet.     Pertinent Vitals/Pain No pain HR 116   Mobility  Bed Mobility Bed Mobility: Not assessed Details for Bed Mobility Assistance: pt in chair before and after session Transfers Sit to Stand: 5: Supervision;From chair/3-in-1;With armrests Stand to Sit: With armrests;To chair/3-in-1 Details for Transfer Assistance: cueing for sequence and increased anterior translation to ease transfer Ambulation/Gait Ambulation/Gait Assistance: 5: Supervision Ambulation Distance (Feet): 100 Feet Assistive device: Rolling walker Ambulation/Gait Assistance Details:  constant cues to step into RW and extend trunk as pt maintaining flexed posture with self posterior to RW throughout Gait Pattern: Step-through pattern;Wide base of support;Decreased stride length;Trunk flexed Gait velocity: decreased Stairs: Yes Stairs Assistance: 4: Min guard Stair Management Technique: Step to pattern;Two rails;Forwards;Sideways (forward to ascend, sideways to descend) Number of Stairs: 4    Exercises General Exercises - Lower Extremity Long Arc Quad: AROM;Seated;Both;20 reps Hip Flexion/Marching: AROM;Seated;Both;20 reps Toe Raises: AROM;Seated;Both;20 reps Heel Raises: AROM;Seated;Both;20 reps   PT Diagnosis:    PT Problem List:   PT Treatment Interventions:     PT Goals (current goals can now be found in the care plan section)    Visit Information  Last PT Received On: 04/02/13 Assistance Needed: +1 History of Present Illness: 57 y.o. male admitted to [facility] on 03/29/13 with diastolic CHF, HTN, HL, OSA, gout, DM on insulin presented with watery diarrhea for 6 days .  The dehydration sent him into acute on chronic renal failure.  He also has been dx with respiratory acidosis.      Subjective Data      Cognition  Cognition Arousal/Alertness: Awake/alert Behavior During Therapy: WFL for tasks assessed/performed Overall Cognitive Status: Within Functional Limits for tasks assessed    Balance     End of Session PT - End of Session Activity Tolerance: Patient limited by fatigue Patient left: in chair;with call bell/phone within reach;with family/visitor present Nurse Communication: Mobility status   GP     [name] 04/02/2013, 12:45 PM [name], PT [phone]

## 2013-04-03 DIAGNOSIS — G4733 Obstructive sleep apnea (adult) (pediatric): Secondary | ICD-10-CM

## 2013-04-03 LAB — BASIC METABOLIC PANEL
Calcium: 8.7 mg/dL (ref 8.4–10.5)
Chloride: 105 mEq/L (ref 96–112)
GFR calc Af Amer: 43 mL/min — ABNORMAL LOW (ref >90)
GFR calc non Af Amer: 37 mL/min — ABNORMAL LOW (ref >90)
Glucose, Bld: 217 mg/dL — ABNORMAL HIGH (ref 70–99)
Sodium: 141 mEq/L (ref 135–145)

## 2013-04-03 MED ORDER — INSULIN GLARGINE 100 UNIT/ML ~~LOC~~ SOLN: 30.0000 [IU] | Freq: Every day | SUBCUTANEOUS | Status: DC

## 2013-04-03 NOTE — Progress Notes (Signed)
Pt placed on Auto-Bipap via FFM with 2 LPM O2 bleed in, Pt tolerating well at this time, RT to monitor and assess as needed.

## 2013-04-03 NOTE — Discharge Summary (Signed)
Physician Discharge Summary  Alexander Hubbard YNW:[id number] DOB: 1955-07-26 DOA: 03/29/2013  PCP: Leo Grosser, MD  Admit date: 03/29/2013 Discharge date: 04/03/2013  Time spent: 35 minutes  Recommendations for Outpatient Follow-up:  1. Needs to follow up with nephrologist for repeat B-met and consideration resume ACE.  2. Needs to follow up with pulmonologist , establish care for OSA.  3. Needs to follow up with Dr Lucianne Muss for further adjustment of insulin regimen.   Discharge Diagnoses:    Acute on chronic renal failure, ATN   Encephalopathy secondary to acidosis.    Metabolic and respiratory acidosis.    SIRS (systemic inflammatory response syndrome)   IDDM (insulin dependent diabetes mellitus)   OSA (obstructive sleep apnea)   Diastolic CHF, chronic   HTN (hypertension)   HLD (hyperlipidemia)   Morbid obesity   Renal failure, acute on chronic   Metabolic acidosis   Diarrhea   Acidosis    Discharge Condition: improved.   Diet recommendation: Carb modified.   Filed Weights   04/01/13 1106 04/02/13 0528 04/03/13 0621  Weight: 184.2 kg (406 lb 1.4 oz) 183.4 kg (404 lb 5.2 oz) 183.2 kg (403 lb 14.1 oz)    History of present illness:  57 y/o morbidly obese mael with diastolic CHF, HTN, HL, OSA, gout, DM on insulin presented with watery diarrhea for 6 days . Patient reports watery diarrhea off 6-7 episodes a day he without any blood or mucus. Patient denies any sick contacts, fever, chills, any recent travel. Denies eating anything outside. He denies any new medications or taking NSAIDs over-the-counter. He denies any headache, dizziness, blurred vision, chest, palpitations, dysuria, hematuria or joint pains. Patient reports having nausea and 1 episode of vomiting this morning.   Hospital Course:  1-Encephalopathy: Resolved.  multifactorial : acidosis, renal failure. Improved with BIPAP. Patient alert, awake, following command. Appreciate CCM.   2-Acute on CKD: Cr  baseline 1.8 to 2. probably ATN in setting diarrhea, hypotension, ACE. Continue to hold ACE at discharge. Received IV bicarb gtt. Renal function improving, cr has decrease to 1.9. Creatinine peak 4.4 during this admission.  Hyperkalemia resolved. Resume home dose lasix at discharge.   3-Metabolic acidosis; resolved with bicarb gtt. Could be secondary to renal failure, diarrhea and respiratory failure. Lactic acid at 1.7. Resolved.  4-SIRS: Resolved.  presents with diarrhea, hypotension, tachycardia. Continue with IV fluids. Hypotension resolved.Continue to hold BP medications. blood culture no growth to date. Resume coreg.  5-Diarrhea; GI pathogen negative. C diff negative. Resolved.  6-Diastolic HF: monitor for pulmonary edema. Resume lasix at discharge.  7-Anemia; hb stable. No evidence of GI bleed.  8-Diabetes; he has poor oral intake. He has received 25 units of lantus during this admission, CBG 176 to 249. I will discharge him on 30 units of lantus daily. I have advised him to follow up with Dr Lucianne Muss to further adjust his medications. He will probably will start eating more at home. Due to his renal  failure he is at risk for hypoglycemia, for this reason I will not discharge him on lantus home regimen (45 units in am and 65 units at night). I will hold Victoza due to renal failure.  9-Headache: CT head negative. non focal exam.  10-OSA: I gave number of pulmonologist office, so he can establish care.    Procedures:  Renal US: 1. Normal exam.   Consultations:  Dr Lowell Guitar.   Discharge Exam: Filed Vitals:   04/03/13 0621  BP: 152/69  Pulse: 91  Temp: 98.3  F (36.8 C)  Resp: 20    General: no distress.  Cardiovascular: S 1, S 2 RRR Respiratory: CTA  Discharge Instructions  Discharge Orders   Future Appointments Provider Department Dept Phone   04/07/2013 10:45 AM Donato Schultz, MD [facility] [phone]   04/08/2013 10:30 AM [facility]  [address] [phone]   04/08/2013 11:00 AM Fransisco Hertz, MD [facility] [phone]   05/11/2013 2:30 PM Marlowe Aschoff, DPM [facility] [phone]   Future Orders Complete By Expires   Diet - low sodium heart healthy  As directed    Increase activity slowly  As directed        Medication List    STOP taking these medications       lisinopril 10 MG tablet  Commonly known as:  PRINIVIL,ZESTRIL     VICTOZA 18 MG/3ML Soln injection  Generic drug:  Liraglutide      TAKE these medications       aspirin 325 MG tablet  Take 325 mg by mouth daily.     brinzolamide 1 % ophthalmic suspension  Commonly known as:  AZOPT  Place 1 drop into both eyes 2 (two) times daily.     carvedilol 12.5 MG tablet  Commonly known as:  COREG  Take 12.5 mg by mouth 2 (two) times daily with a meal.     colchicine 0.6 MG tablet  Take 0.6 mg by mouth daily.     febuxostat 40 MG tablet  Commonly known as:  ULORIC  Take 80 mg by mouth daily.     furosemide 40 MG tablet  Commonly known as:  LASIX  Take 40 mg by mouth 2 (two) times daily.     gabapentin 300 MG capsule  Commonly known as:  NEURONTIN  Take 2 capsules (600 mg total) by mouth 3 (three) times daily.     HUMULIN R 500 UNIT/ML Soln injection  Generic drug:  insulin regular human CONCENTRATED  Inject 6-10 Units into the skin 3 (three) times daily with meals. Inject 8 units in AM, 6 units at Lunch, and 10 units at dinner.     insulin glargine 100 UNIT/ML injection  Commonly known as:  LANTUS  Inject 0.3 mLs (30 Units total) into the skin daily.     Insulin Syringe-Needle U-100 30G X 1/2" 0.3 ML Misc  Commonly known as:  B-D INS SYR ULTRAFINE .3CC/30G  USE AS DIRECTED 3 TIMES A DAY. DX CODE: 250.00     latanoprost 0.005 % ophthalmic solution  Commonly known as:  XALATAN  Place 1 drop into the left eye at bedtime.     niacin 1000 MG CR tablet  Commonly known as:  NIASPAN   Take 1,000 mg by mouth at bedtime.     pantoprazole 40 MG tablet  Commonly known as:  PROTONIX  Take 1 tablet (40 mg total) by mouth 2 (two) times daily.     prednisoLONE acetate 1 % ophthalmic suspension  Commonly known as:  PRED FORTE  Place 1 drop into the right eye at bedtime.     rosuvastatin 20 MG tablet  Commonly known as:  CRESTOR  Take 20 mg by mouth daily.     timolol 0.5 % ophthalmic solution  Commonly known as:  TIMOPTIC  Place 1 drop into both eyes 2 (two) times daily.     traMADol-acetaminophen 37.5-325 MG per tablet  Commonly known as:  ULTRACET  Take 1 tablet by mouth every 6 (six) hours as needed for pain.     vitamin B-12 1000 MCG tablet  Commonly known as:  CYANOCOBALAMIN  Take 1,000 mcg by mouth daily.       Allergies  Allergen Reactions  . Penicillins Swelling and Rash       Follow-up Information   Follow up with POWELL,ALVIN C, MD In 1 week.   Specialty:  Nephrology   Contact information:   [address]                          [place] [address] [phone]       Follow up with [facility], MD. (next week)    Specialty:  Endocrinology   Contact information:   [address] [place] [address] [place] [address] [phone]        The results of significant diagnostics from this hospitalization (including imaging, microbiology, ancillary and laboratory) are listed below for reference.    Significant Diagnostic Studies: Ct Abdomen Pelvis Wo Contrast  03/30/2013   CLINICAL DATA:  Diarrhea, nausea.  EXAM: CT ABDOMEN AND PELVIS WITHOUT CONTRAST  TECHNIQUE: Multidetector CT imaging of the abdomen and pelvis was performed following the standard protocol without intravenous contrast.  COMPARISON:  Abdominal ultrasound March 21, 2013.  FINDINGS: Large body habitus required a larger field of view, limiting the overall image quality.  Limited view of the lung bases are clear. Mitral annulus calcification, the heart is not enlarged.  The  stomach, small and large bowel are normal in course and caliber. Contrast in the small bowel, has yet to reach the large bowel. No definite and inflammatory changes.  The liver, spleen, gallbladder, pancreas and adrenal glands are grossly normal for this technically limited nonenhanced examination. Please note, possible cirrhosis suggested on recent ultrasound would not be well assessed on this examination.  Kidneys are normal in size, no nephrolithiasis or hydronephrosis. Great vessels are normal in course and caliber with mild calcific atherosclerosis. Urinary bladder is unremarkable. Prostate is not enlarged. No intraperitoneal free fluid nor free air. Degenerative change of the lumbar spine with suspected moderate to severe canal stenosis at L2-3.  IMPRESSION: Habitus limited examination with no definite acute intra-abdominal pelvic process.   Electronically Signed   By: Awilda Metro   On: 03/30/2013 05:23   Ct Head Wo Contrast  04/01/2013   CLINICAL DATA:  Headache. History of diabetes and renal failure. Hypertension.  EXAM: CT HEAD WITHOUT CONTRAST  TECHNIQUE: Contiguous axial images were obtained from the base of the skull through the vertex without intravenous contrast.  COMPARISON:  MR angiography 12/25/2011. Previous head CT 03/12/2008.  FINDINGS: The brain shows mild generalized atrophy. There is no evidence of acute infarction. There are minor changes of low density in the hemispheric deep white matter suggesting chronic small vessel disease. No cortical or large vessel territory insult. No mass lesion, hemorrhage, hydrocephalus or extra-axial collection. The calvarium is unremarkable. There is atherosclerotic calcification of the major vessels at the base of the brain. There is mild mucosal thickening in the sphenoid sinus an along the floors of the maxillary sinuses.  IMPRESSION: No acute intracranial finding. Mild chronic appearing small vessel change of the hemispheric deep white matter.   Mild mucosal thickening of the maxillary and sphenoid sinuses without evidence of advanced sinus inflammation.   Electronically Signed   By: Paulina Fusi M.D.   On: 04/01/2013 09:41   US Renal  03/30/2013   CLINICAL DATA:  Acute kidney injury  EXAM: RENAL/URINARY TRACT ULTRASOUND COMPLETE  COMPARISON:  03/30/2013  FINDINGS: Right Kidney  Length: 13.6 cm. Echogenicity within normal limits. No mass or hydronephrosis visualized.  Left Kidney  Length: 13.8 cm. Echogenicity within normal limits. No mass or hydronephrosis visualized.  Bladder  Appears normal for degree of bladder distention.  IMPRESSION: 1. Normal exam.   Electronically Signed   By: Signa Kell M.D.   On: 03/30/2013 15:20   US Abdomen Limited Ruq  03/21/2013   CLINICAL DATA:  Abdominal pain  EXAM: US ABDOMEN LIMITED - RIGHT UPPER QUADRANT  COMPARISON:  None.  FINDINGS: Gallbladder  No gallstones or wall thickening visualized. No sonographic Murphy sign noted.  Common bile duct  Diameter: 7.2 mm proximally. No duct stone is seen.  Liver:  Liver shows a coarsened echotexture, but is normal in size with no mass or focal lesion.  IMPRESSION: 1. Normal gallbladder 2. Common bile duct is mildly dilated measuring 7.2 mm. No duct stone is seen. 3. Coarsened liver echotexture. This is nonspecific. It can be seen with cirrhosis. No liver mass or focal lesion.   Electronically Signed   By: Amie Portland M.D.   On: 03/21/2013 11:41    Microbiology: Recent Results (from the past 240 hour(s))  CULTURE, BLOOD (ROUTINE X 2)     Status: None   Collection Time    03/30/13  8:48 AM      Result Value Range Status   Specimen Description BLOOD LEFT HAND   Final   Special Requests BOTTLES DRAWN AEROBIC ONLY 10CC   Final   Culture  Setup Time     Final   Value: 03/30/2013 16:21     Performed at [facility]   Culture     Final   Value:        BLOOD CULTURE RECEIVED NO GROWTH TO DATE CULTURE WILL BE HELD FOR 5 DAYS BEFORE ISSUING A FINAL NEGATIVE  REPORT     Performed at [facility]   Report Status PENDING   Incomplete  CULTURE, BLOOD (ROUTINE X 2)     Status: None   Collection Time    03/30/13 10:05 AM      Result Value Range Status   Specimen Description BLOOD RIGHT ARM   Final   Special Requests BOTTLES DRAWN AEROBIC ONLY 10CC   Final   Culture  Setup Time     Final   Value: 03/30/2013 17:36     Performed at [facility]   Culture     Final   Value:        BLOOD CULTURE RECEIVED NO GROWTH TO DATE CULTURE WILL BE HELD FOR 5 DAYS BEFORE ISSUING A FINAL NEGATIVE REPORT     Performed at [facility]   Report Status PENDING   Incomplete  CLOSTRIDIUM DIFFICILE BY PCR     Status: None   Collection Time    03/30/13 12:18 PM      Result Value Range Status   C difficile by pcr NEGATIVE  NEGATIVE Final  MRSA PCR SCREENING     Status: None   Collection Time    03/31/13  1:38 PM      Result Value Range Status   MRSA by PCR NEGATIVE  NEGATIVE Final   Comment:            The GeneXpert MRSA Assay (FDA     approved for NASAL specimens  only), is one component of a     comprehensive MRSA colonization     surveillance program. It is not     intended to diagnose MRSA     infection nor to guide or     monitor treatment for     MRSA infections.     Labs: Basic Metabolic Panel:  Recent Labs Lab 03/31/13 0450 03/31/13 1835 04/01/13 0550 04/02/13 0400 04/02/13 1540 04/03/13 0611  NA 135 135 136 143  --  141  K 5.7* 5.5* 5.3* 5.4* 5.0 4.7  CL 106 105 106 109  --  105  CO2 17* 17* 18* 24  --  27  GLUCOSE 134* 166* 203* 181*  --  217*  BUN 86* 78* 69* 53*  --  38*  CREATININE 4.14* 3.59* 2.87* 2.17*  --  1.93*  CALCIUM 8.0* 8.1* 8.4 8.7  --  8.7  PHOS  --  4.4 4.0 3.2  --   --    Liver Function Tests:  Recent Labs Lab 03/29/13 2325 03/31/13 1835 04/01/13 0550 04/02/13 0400  AST 13  --   --   --   ALT 10  --   --   --   ALKPHOS 43  --   --   --   BILITOT 0.2*  --   --   --   PROT 6.7  --    --   --   ALBUMIN 3.1* 3.0* 2.9* 3.1*    Recent Labs Lab 03/30/13 1024  LIPASE 14   No results found for this basename: AMMONIA,  in the last 168 hours CBC:  Recent Labs Lab 03/29/13 2325 03/31/13 0450 04/01/13 0550  WBC 11.8* 10.9* 6.0  NEUTROABS 7.8*  --   --   HGB 9.5* 8.9* 9.3*  HCT 28.0* 27.5* 27.8*  MCV 81.2 84.1 81.3  PLT 185 177 142*   Cardiac Enzymes: No results found for this basename: CKTOTAL, CKMB, CKMBINDEX, TROPONINI,  in the last 168 hours BNP: BNP (last 3 results)  Recent Labs  09/22/12 0927  PROBNP 225.2*   CBG:  Recent Labs Lab 04/02/13 0612 04/02/13 1123 04/02/13 1614 04/02/13 2124 04/03/13 0615  GLUCAP 206* 216* 215* 229* 195*       Signed:  Darlinda Bellows  [facility] 04/03/2013, 9:45 AM

## 2013-04-04 ENCOUNTER — Other Ambulatory Visit: Payer: Self-pay | Admitting: Podiatrist

## 2013-04-04 ENCOUNTER — Other Ambulatory Visit: Payer: Self-pay | Admitting: Family Medicine

## 2013-04-05 LAB — CULTURE, BLOOD (ROUTINE X 2): Culture: NO GROWTH

## 2013-04-06 ENCOUNTER — Other Ambulatory Visit: Payer: Self-pay | Admitting: *Deleted

## 2013-04-06 ENCOUNTER — Telehealth: Payer: Self-pay | Admitting: Pulmonary Disease

## 2013-04-06 MED ORDER — INSULIN GLARGINE 100 UNIT/ML SOLOSTAR PEN: PEN_INJECTOR | SUBCUTANEOUS | Status: DC

## 2013-04-06 NOTE — Telephone Encounter (Signed)
Spoke with pt and appt scheduled for pt. Nothing further needed

## 2013-04-07 ENCOUNTER — Other Ambulatory Visit: Payer: Self-pay | Admitting: Family Medicine

## 2013-04-07 ENCOUNTER — Encounter: Payer: Self-pay | Admitting: Family Medicine

## 2013-04-07 ENCOUNTER — Encounter: Payer: Self-pay | Admitting: Endocrinology

## 2013-04-07 ENCOUNTER — Ambulatory Visit (INDEPENDENT_AMBULATORY_CARE_PROVIDER_SITE_OTHER): Payer: Medicaid Other | Admitting: Cardiology

## 2013-04-07 ENCOUNTER — Ambulatory Visit (INDEPENDENT_AMBULATORY_CARE_PROVIDER_SITE_OTHER): Payer: Medicaid Other | Admitting: Family Medicine

## 2013-04-07 ENCOUNTER — Encounter: Payer: Self-pay | Admitting: Cardiology

## 2013-04-07 ENCOUNTER — Other Ambulatory Visit: Payer: Self-pay | Admitting: *Deleted

## 2013-04-07 ENCOUNTER — Ambulatory Visit (INDEPENDENT_AMBULATORY_CARE_PROVIDER_SITE_OTHER): Payer: Medicaid Other | Admitting: Endocrinology

## 2013-04-07 ENCOUNTER — Encounter: Payer: Self-pay | Admitting: Vascular Surgery

## 2013-04-07 VITALS — BP 160/70 | HR 82 | Temp 97.2°F | Resp 22 | Wt >= 6400 oz

## 2013-04-07 VITALS — BP 188/92 | HR 76 | Ht 68.5 in | Wt >= 6400 oz

## 2013-04-07 VITALS — BP 142/90 | HR 68 | Temp 98.7°F | Resp 14 | Ht 68.0 in | Wt >= 6400 oz

## 2013-04-07 DIAGNOSIS — E1149 Type 2 diabetes mellitus with other diabetic neurological complication: Secondary | ICD-10-CM

## 2013-04-07 DIAGNOSIS — I509 Heart failure, unspecified: Secondary | ICD-10-CM

## 2013-04-07 DIAGNOSIS — Z23 Encounter for immunization: Secondary | ICD-10-CM

## 2013-04-07 DIAGNOSIS — G4733 Obstructive sleep apnea (adult) (pediatric): Secondary | ICD-10-CM

## 2013-04-07 DIAGNOSIS — N184 Chronic kidney disease, stage 4 (severe): Secondary | ICD-10-CM

## 2013-04-07 DIAGNOSIS — Z09 Encounter for follow-up examination after completed treatment for conditions other than malignant neoplasm: Secondary | ICD-10-CM

## 2013-04-07 DIAGNOSIS — E785 Hyperlipidemia, unspecified: Secondary | ICD-10-CM

## 2013-04-07 DIAGNOSIS — I5032 Chronic diastolic (congestive) heart failure: Secondary | ICD-10-CM

## 2013-04-07 DIAGNOSIS — I10 Essential (primary) hypertension: Secondary | ICD-10-CM

## 2013-04-07 DIAGNOSIS — N179 Acute kidney failure, unspecified: Secondary | ICD-10-CM

## 2013-04-07 LAB — BASIC METABOLIC PANEL
CO2: 27 mEq/L (ref 19–32)
Calcium: 8.9 mg/dL (ref 8.4–10.5)
Chloride: 100 mEq/L (ref 96–112)
Potassium: 5 mEq/L (ref 3.5–5.1)
Sodium: 138 mEq/L (ref 135–145)

## 2013-04-07 MED ORDER — "INSULIN SYRINGE-NEEDLE U-100 30G X 1/2"" 0.3 ML MISC": Status: DC

## 2013-04-07 MED ORDER — PNEUMOCOCCAL 13-VAL CONJ VACC IM SUSP: 0.5000 mL | Freq: Once | INTRAMUSCULAR | Status: DC

## 2013-04-07 MED ORDER — INSULIN GLARGINE 100 UNIT/ML SOLOSTAR PEN: PEN_INJECTOR | SUBCUTANEOUS | Status: DC

## 2013-04-07 MED ORDER — INSULIN REGULAR HUMAN (CONC) 500 UNIT/ML ~~LOC~~ SOLN: 6.0000 [IU] | Freq: Three times a day (TID) | SUBCUTANEOUS | Status: DC

## 2013-04-07 MED ORDER — ATORVASTATIN CALCIUM 40 MG PO TABS: 40.0000 mg | ORAL_TABLET | Freq: Every day | ORAL | Status: DC

## 2013-04-07 MED ORDER — AMLODIPINE BESYLATE 5 MG PO TABS: 5.0000 mg | ORAL_TABLET | Freq: Every day | ORAL | Status: DC

## 2013-04-07 NOTE — Progress Notes (Signed)
Subjective:    Patient ID: Alexander Hubbard, male    DOB: Jan 13, 1956, 57 y.o.   MRN: [id number]  HPI Patient was recently admitted to the hospital from October 28 through November 2 with acute on chronic renal failure due to to dehydration, diarrhea, and ATN secondary to ACE inhibitor.  I have included a relative portions of the discharge summary below Admit date: 03/29/2013  Discharge date: 04/03/2013  Time spent: 35 minutes  Recommendations for Outpatient Follow-up:  1. Needs to follow up with nephrologist for repeat B-met and consideration resume ACE.  2. Needs to follow up with pulmonologist , establish care for OSA.  3. Needs to follow up with Dr Lucianne Muss for further adjustment of insulin regimen.  Discharge Diagnoses:  Acute on chronic renal failure, ATN  Encephalopathy secondary to acidosis.  Metabolic and respiratory acidosis.  SIRS (systemic inflammatory response syndrome)  IDDM (insulin dependent diabetes mellitus)  OSA (obstructive sleep apnea)  Diastolic CHF, chronic  HTN (hypertension)  HLD (hyperlipidemia)  Morbid obesity  Renal failure, acute on chronic  Metabolic acidosis  Diarrhea  Acidosis  Discharge Condition: improved.  Diet recommendation: Carb modified.  Filed Weights    04/01/13 1106  04/02/13 0528  04/03/13 0621   Weight:  184.2 kg (406 lb 1.4 oz)  183.4 kg (404 lb 5.2 oz)  183.2 kg (403 lb 14.1 oz)   History of present illness:  56 y/o morbidly obese mael with diastolic CHF, HTN, HL, OSA, gout, DM on insulin presented with watery diarrhea for 6 days . Patient reports watery diarrhea off 6-7 episodes a day he without any blood or mucus. Patient denies any sick contacts, fever, chills, any recent travel. Denies eating anything outside. He denies any new medications or taking NSAIDs over-the-counter. He denies any headache, dizziness, blurred vision, chest, palpitations, dysuria, hematuria or joint pains. Patient reports having nausea and 1 episode of vomiting this  morning.  Hospital Course:  1-Encephalopathy: Resolved.  multifactorial : acidosis, renal failure. Improved with BIPAP. Patient alert, awake, following command. Appreciate CCM.  2-Acute on CKD: Cr baseline 1.8 to 2. probably ATN in setting diarrhea, hypotension, ACE. Continue to hold ACE at discharge. Received IV bicarb gtt. Renal function improving, cr has decrease to 1.9. Creatinine peak 4.4 during this admission. Hyperkalemia resolved. Resume home dose lasix at discharge.  3-Metabolic acidosis; resolved with bicarb gtt. Could be secondary to renal failure, diarrhea and respiratory failure. Lactic acid at 1.7. Resolved.  4-SIRS: Resolved.  presents with diarrhea, hypotension, tachycardia. Continue with IV fluids. Hypotension resolved.Continue to hold BP medications. blood culture no growth to date. Resume coreg.  5-Diarrhea; GI pathogen negative. C diff negative. Resolved.  6-Diastolic HF: monitor for pulmonary edema. Resume lasix at discharge.  7-Anemia; hb stable. No evidence of GI bleed.  8-Diabetes; he has poor oral intake. He has received 25 units of lantus during this admission, CBG 176 to 249. I will discharge him on 30 units of lantus daily. I have advised him to follow up with Dr Lucianne Muss to further adjust his medications. He will probably will start eating more at home. Due to his renal failure he is at risk for hypoglycemia, for this reason I will not discharge him on lantus home regimen (45 units in am and 65 units at night). I will hold Victoza due to renal failure.  9-Headache: CT head negative. non focal exam.  10-OSA: I gave number of pulmonologist office, so he can establish care.    the patient is here today  for followup.  His urine output has began to improve dramatically since being discharged in the hospital. He has significant pitting edema in both legs. This is slowly beginning to improve after he has resumed Lasix. He saw his endocrinologist this morning who recommended starting  amlodipine for hypertension. The patient has prescription for amlodipine 5 mg by mouth daily. His blood pressure is elevated today at 160/70. We are holding his ACE inhibitor due to this recent history of ATN. The hospital has requested followup with a nephrologist to determine when it is safe to resume his lisinopril.  The patient denies any chest pain, shortness of breath, dyspnea on exertion. He is having no further diarrhea or abdominal pain. His oral intake has improved. He is noncompliant with CPAP therapy at home. He does have appointment to follow up with pulmonology to discuss BiPAP therapy.  His gastric bypass is currently on hold. Past Medical History  Diagnosis Date  . Diabetes mellitus   . Hypertension   . Obesity   . Sleep apnea   . Gouty arthritis    Current Outpatient Prescriptions on File Prior to Visit  Medication Sig Dispense Refill  . ammonium lactate (LAC-HYDRIN) 12 % lotion APPLY TO LEGS 2 TIMES DAILY  57 g  4  . aspirin 325 MG tablet Take 325 mg by mouth daily.      . brinzolamide (AZOPT) 1 % ophthalmic suspension Place 1 drop into both eyes 2 (two) times daily.        . carvedilol (COREG) 12.5 MG tablet Take 12.5 mg by mouth 2 (two) times daily with a meal.      . colchicine 0.6 MG tablet Take 0.6 mg by mouth daily.      . CRESTOR 20 MG tablet TAKE 1 TABLET BY MOUTH DAILY AT BEDTIME  30 tablet  2  . febuxostat (ULORIC) 40 MG tablet Take 80 mg by mouth daily.      . furosemide (LASIX) 40 MG tablet Take 40 mg by mouth 2 (two) times daily.      Marland Kitchen gabapentin (NEURONTIN) 300 MG capsule Take 2 capsules (600 mg total) by mouth 3 (three) times daily.  180 capsule  3  . latanoprost (XALATAN) 0.005 % ophthalmic solution Place 1 drop into the left eye at bedtime.       . niacin (NIASPAN) 1000 MG CR tablet Take 1,000 mg by mouth at bedtime.      . prednisoLONE acetate (PRED FORTE) 1 % ophthalmic suspension Place 1 drop into the right eye at bedtime.       . timolol (TIMOPTIC) 0.5 %  ophthalmic solution Place 1 drop into both eyes 2 (two) times daily.      . traMADol-acetaminophen (ULTRACET) 37.5-325 MG per tablet Take 1 tablet by mouth every 6 (six) hours as needed for pain.      . vitamin B-12 (CYANOCOBALAMIN) 1000 MCG tablet Take 1,000 mcg by mouth daily.        . pantoprazole (PROTONIX) 40 MG tablet Take 1 tablet (40 mg total) by mouth 2 (two) times daily.  60 tablet  3   No current facility-administered medications on file prior to visit.   Allergies  Allergen Reactions  . Penicillins Swelling and Rash   History   Social History  . Marital Status: Unknown    Spouse Name: N/A    Number of Children: N/A  . Years of Education: N/A   Occupational History  . Not on file.   Social  History Main Topics  . Smoking status: Never Smoker   . Smokeless tobacco: Never Used  . Alcohol Use: No  . Drug Use: No  . Sexual Activity: Not on file   Other Topics Concern  . Not on file   Social History Narrative  . No narrative on file      Review of Systems  All other systems reviewed and are negative.       Objective:   Physical Exam  Vitals reviewed. Constitutional: He appears well-developed and well-nourished.  Neck: Neck supple. No JVD present.  Cardiovascular: Normal rate, regular rhythm and normal heart sounds.  Exam reveals no gallop and no friction rub.   No murmur heard. Pulmonary/Chest: Effort normal and breath sounds normal. No respiratory distress. He has no wheezes. He has no rales.  Abdominal: Soft. Bowel sounds are normal. He exhibits no distension and no mass. There is no tenderness. There is no rebound and no guarding.  Musculoskeletal: He exhibits edema.  Lymphadenopathy:    He has no cervical adenopathy.   patient has tense 2+ edema in both legs distal to the knee.        Assessment & Plan:  1. Hospital discharge follow-up Start Norvasc 5 mg by mouth daily and recheck blood pressure in one week. I will schedule the patient for  followup appointment with Dr. Lowell Guitar of nephrology. I defer to Dr. Lowell Guitar if it is safe to resume the ACE inhibitor in the future. I continue to encourage the patient to proceed with the gastric bypass. Even though the risk of surgery is high, I believe his 5 year mortality would be extremely high if he does not have substantial weight loss - COMPLETE METABOLIC PANEL WITH GFR - CBC with Differential - Ambulatory referral to Nephrology  2. Acute renal failure Recheck a CMP to see if the patient has stabilized back at his baseline creatinine of 1.8-1.9 - COMPLETE METABOLIC PANEL WITH GFR - CBC with Differential - Ambulatory referral to Nephrology  3. Need for prophylactic vaccination and inoculation against unspecified single disease Patient has had Pneumovax. Given his chronic kidney disease I will also give the patient prevnar 13. - pneumococcal 13-valent conjugate vaccine (PREVNAR 13) injection 0.5 mL; Inject 0.5 mLs into the muscle once.

## 2013-04-07 NOTE — Progress Notes (Signed)
[address], [address] [place], [place]  [address] Phone: [phone] Fax:  [phone]  Date:  04/07/2013   ID:  Alexander Hubbard, DOB [birth year], MRN [id number]  PCP:  Leo Grosser, MD   History of Present Illness: Alexander Hubbard is a 57 y.o. male with hyperlipidemia, difficult to control diabetes, morbid obesity, hyperlipidemia with EF probably 45%. NUC stress 4/13 low risk (review sensitivity due to attenuation artifact). Awaiting lap band surgery.  He was seen by me previously in the hospital setting for a chest pain consultation. His chest pain was fleeting and likely related to gastroenteritis. He was having severe diarrhea at the time and some nausea. His chest pain promptly resolved. After discussion with he and his family as well as thorough examination, I did not feel that further cardiac testing is warranted at that time.  His weight has been fairly consistent ranging from 388 pounds up to 396 pounds back down to 388 pounds.  We once again discussed his risks for or lap band surgery and he understands that given his morbid obesity that there is always a heightened risk in this situation. I did not feel from a cardiac perspective that he should be prohibited from proceeding however. He understands that he may have a cardiovascular complication however.  He previously complained of rushing sound in his left ear. ENT..MRI...normal. He denies any significant anginal-like symptoms, mild shortness of breath with activity which may be linked to obesity. Tired at times. Does not want to fall and break something. No prior MI. No syncope, no significant palpitations. Currently on carvedilol, Lasix, lisinopril.  In [place], (Dr. Enis Gash) was cardiologist.  Was in hospital 11/14 - Had diarrhea. Immodium. BP was low. Renal function was poor. 2.17 creat on admit.     Wt Readings from Last 3 Encounters:  04/07/13 402 lb 1.9 oz (182.4 kg)  04/03/13 403 lb 14.1 oz (183.2 kg)    02/17/13 390 lb (176.903 kg)     Past Medical History  Diagnosis Date  . Diabetes mellitus   . Hypertension   . Obesity   . Sleep apnea   . Gouty arthritis     Past Surgical History  Procedure Laterality Date  . Eye surgery    . Knee surgery      Current Outpatient Prescriptions  Medication Sig Dispense Refill  . ammonium lactate (LAC-HYDRIN) 12 % lotion APPLY TO LEGS 2 TIMES DAILY  57 g  4  . aspirin 325 MG tablet Take 325 mg by mouth daily.      . brinzolamide (AZOPT) 1 % ophthalmic suspension Place 1 drop into both eyes 2 (two) times daily.        . carvedilol (COREG) 12.5 MG tablet Take 12.5 mg by mouth 2 (two) times daily with a meal.      . colchicine 0.6 MG tablet Take 0.6 mg by mouth daily.      . CRESTOR 20 MG tablet TAKE 1 TABLET BY MOUTH DAILY AT BEDTIME  30 tablet  2  . febuxostat (ULORIC) 40 MG tablet Take 80 mg by mouth daily.      . furosemide (LASIX) 40 MG tablet Take 40 mg by mouth 2 (two) times daily.      Marland Kitchen gabapentin (NEURONTIN) 300 MG capsule Take 2 capsules (600 mg total) by mouth 3 (three) times daily.  180 capsule  3  . Insulin Glargine 100 UNIT/ML SOPN 30 Units daily. Inject 10 units in the morning  and 20 units at night.      . insulin regular human CONCENTRATED (HUMULIN R) 500 UNIT/ML SOLN injection Inject 6-10 Units into the skin 3 (three) times daily with meals. Inject 8 units in AM, 6 units at Lunch, and 10 units at dinner.      . Insulin Syringe-Needle U-100 (B-D INS SYR ULTRAFINE .3CC/30G) 30G X 1/2" 0.3 ML MISC USE AS DIRECTED 3 TIMES A DAY. DX CODE: 250.00  100 each  3  . latanoprost (XALATAN) 0.005 % ophthalmic solution Place 1 drop into the left eye at bedtime.       . niacin (NIASPAN) 1000 MG CR tablet Take 1,000 mg by mouth at bedtime.      . pantoprazole (PROTONIX) 40 MG tablet Take 1 tablet (40 mg total) by mouth 2 (two) times daily.  60 tablet  3  . prednisoLONE acetate (PRED FORTE) 1 % ophthalmic suspension Place 1 drop into the right eye  at bedtime.       . rosuvastatin (CRESTOR) 20 MG tablet Take 20 mg by mouth daily.      . timolol (TIMOPTIC) 0.5 % ophthalmic solution Place 1 drop into both eyes 2 (two) times daily.      . traMADol-acetaminophen (ULTRACET) 37.5-325 MG per tablet Take 1 tablet by mouth every 6 (six) hours as needed for pain.      . vitamin B-12 (CYANOCOBALAMIN) 1000 MCG tablet Take 1,000 mcg by mouth daily.         No current facility-administered medications for this visit.    Allergies:    Allergies  Allergen Reactions  . Penicillins Swelling and Rash    Social History:  The patient  reports that he has never smoked. He has never used smokeless tobacco. He reports that he does not drink alcohol or use illicit drugs.   ROS:  Please see the history of present illness.  Denies any syncope, chest pain, orthopnea, PND.  PHYSICAL EXAM: VS:  BP 188/92  Pulse 76  Ht 5' 8.5" (1.74 m)  Wt 402 lb 1.9 oz (182.4 kg)  BMI 60.25 kg/m2 Morbidly obese, uses wheelchair, in no acute distress HEENT: normalthick neck Neck: no JVD Cardiac:  normal S1, S2; RRR; no murmur, distant heart sounds Lungs:  clear to auscultation bilaterally, no wheezing, rhonchi or rales Abd: soft, nontender, no hepatomegalyobese Ext: severe chronic venous insufficiency, chronic edema bilateral lower extremities Skin: warm and dry Neuro: no focal abnormalities noted      ASSESSMENT AND PLAN:  1. Chronic diastolic heart failure-multifactorial results of morbid obesity, obstructive sleep apnea, diabetes, hypertension. Continue to advocate for weight loss. Unfortunately, we usually utilized Lasix/furosemide to help overall manage fluid. This is a tricky balance with him given his recent acute renal failure with creatinine of 4 in the hospital setting. I'm fine with him continuing this current dose. His primary physician, he will see later today. We will be checking his renal function and overall well-being. I strongly advocate weight loss.  He understands that his condition, weight may lead to his overall death. He is waiting for lap band surgery. 2. Recent acute renal failure-creatinine now back down to 1.9. He was placed back on his furosemide after hospitalization. His acute renal failure was likely secondary to intravascular volume depletion secondary to profuse diarrhea. Clinically, he was still short of breath and edematous despite his intravascular volume depletion. I do believe that my options are limited. He is off of his lisinopril appropriately so. 3. Hypertension-primary physician  will be seeing later today. Elevated. 4. Obstructive sleep apnea-CPAP 5. Diabetes-Dr. Lucianne Muss. Monitoring. 6. We will see back in 6 months.  Signed, Donato Schultz, MD [facility]  04/07/2013 11:25 AM

## 2013-04-07 NOTE — Patient Instructions (Addendum)
Your physician recommends that you continue on your current medications as directed. Please refer to the Current Medication list given to you today.  Your physician wants you to follow-up in: 6 months with Dr. Skains. You will receive a reminder letter in the mail two months in advance. If you don't receive a letter, please call our office to schedule the follow-up appointment.  

## 2013-04-07 NOTE — Patient Instructions (Addendum)
Lantus 20 twice daily  Please check blood sugars at least half the time about 2 hours after any meal and as directed on waking up. Please bring blood sugar monitor to each visit  Amlodipine 5mg  daily

## 2013-04-07 NOTE — Progress Notes (Signed)
Patient ID: Alexander Hubbard, male   DOB: 1955/11/08, 57 y.o.   MRN: [id number]  Alexander Hubbard is an 57 y.o. male.   Reason for Appointment: Diabetes follow-up   History of Present Illness   Diagnosis: Type 2 DIABETES MELITUS, insulin requiring     PAST history:He has been on relatively large doses of insulin for a few years and was not well controlled until he started the U-500 insulin This particularly help his fasting hyperglycemia which was difficult to control. He only benefited partially from Victoza and this did not help him with weight loss significantly A1c had increased in 8/14  Insulin regimen: LANTUS 10 in the morning and 20 in the evening with U.-500 insulin a.c. T.i.d., 01-05-09  RECENT history:The patient's diabetes control is difficult to judge as she has not monitored very often However his blood sugars appear to be overall better than the last time Overall he has been trying more  to be compliant with his diet compared to the last visit Also previously had difficulty losing his insulin and food because of irregular eating during Ramadan During recent hospitalization his Lantus dose was reduced significantly and was only sent home on 30 units a day He still has not regained his appetite and will skip his mealtime doses blood sugars low normal  Proper timing of medications in relation to meals: Yes. 30 min ac        Monitors blood glucose: Once a day.    Glucometer: Accu-Chek        Blood Glucose readings from meter download: readings fasting recently 84-131 but highest 186  Nonfasting readings recently 181-244 after 5 PM but previously had mostly good readings prior to hospitalization Hypoglycemia frequency: minimal recently        Meals: 3 meals per day. his wife usually watches his carbohydrate and fat intake         Physical activity: exercise: Less recently, had limited ability to walk            Previous A1c was under 8% Lab Results  Component Value Date   HGBA1C 7.3*  04/07/2013   HGBA1C 8.0* 01/03/2013   HGBA1C 7.3* 09/22/2012   Lab Results  Component Value Date   MICROALBUR 83.1* 01/03/2013   LDLCALC 20 01/03/2013   CREATININE 1.84* 04/07/2013       Medication List       This list is accurate as of: 04/07/13 11:59 PM.  Always use your most recent med list.               amLODipine 5 MG tablet  Commonly known as:  NORVASC  Take 1 tablet (5 mg total) by mouth daily.     ammonium lactate 12 % lotion  Commonly known as:  LAC-HYDRIN  APPLY TO LEGS 2 TIMES DAILY     aspirin 325 MG tablet  Take 325 mg by mouth daily.     atorvastatin 40 MG tablet  Commonly known as:  LIPITOR  Take 1 tablet (40 mg total) by mouth daily.     brinzolamide 1 % ophthalmic suspension  Commonly known as:  AZOPT  Place 1 drop into both eyes 2 (two) times daily.     carvedilol 12.5 MG tablet  Commonly known as:  COREG  Take 12.5 mg by mouth 2 (two) times daily with a meal.     colchicine 0.6 MG tablet  Take 0.6 mg by mouth daily.     CRESTOR 20 MG tablet  Generic  drug:  rosuvastatin  TAKE 1 TABLET BY MOUTH DAILY AT BEDTIME     febuxostat 40 MG tablet  Commonly known as:  ULORIC  Take 80 mg by mouth daily.     furosemide 40 MG tablet  Commonly known as:  LASIX  Take 40 mg by mouth 2 (two) times daily.     gabapentin 300 MG capsule  Commonly known as:  NEURONTIN  Take 2 capsules (600 mg total) by mouth 3 (three) times daily.     Insulin Glargine 100 UNIT/ML Sopn  Inject 20 units in the morning and 20 units at night.     insulin regular human CONCENTRATED 500 UNIT/ML Soln injection  Commonly known as:  HUMULIN R  Inject 0.01-0.02 mLs (5-10 Units total) into the skin 3 (three) times daily with meals. Inject 8 units in AM, 6 units at Lunch, and 10 units at dinner.     Insulin Syringe-Needle U-100 30G X 1/2" 0.3 ML Misc  Commonly known as:  B-D INS SYR ULTRAFINE .3CC/30G  USE AS DIRECTED 3 TIMES A DAY. DX CODE: 250.00     latanoprost 0.005 % ophthalmic  solution  Commonly known as:  XALATAN  Place 1 drop into the left eye at bedtime.     niacin 1000 MG CR tablet  Commonly known as:  NIASPAN  Take 1,000 mg by mouth at bedtime.     pantoprazole 40 MG tablet  Commonly known as:  PROTONIX  Take 1 tablet (40 mg total) by mouth 2 (two) times daily.     prednisoLONE acetate 1 % ophthalmic suspension  Commonly known as:  PRED FORTE  Place 1 drop into the right eye at bedtime.     timolol 0.5 % ophthalmic solution  Commonly known as:  TIMOPTIC  Place 1 drop into both eyes 2 (two) times daily.     traMADol-acetaminophen 37.5-325 MG per tablet  Commonly known as:  ULTRACET  Take 1 tablet by mouth every 6 (six) hours as needed for pain.     vitamin B-12 1000 MCG tablet  Commonly known as:  CYANOCOBALAMIN  Take 1,000 mcg by mouth daily.        Allergies:  Allergies  Allergen Reactions  . Penicillins Swelling and Rash    Past Medical History  Diagnosis Date  . Diabetes mellitus   . Hypertension   . Obesity   . Sleep apnea   . Gouty arthritis     Past Surgical History  Procedure Laterality Date  . Eye surgery    . Knee surgery      Family History  Problem Relation Age of Onset  . Hypertension Sister   . Diabetes Mellitus II Sister     Social History:  reports that he has never smoked. He has never used smokeless tobacco. He reports that he does not drink alcohol or use illicit drugs.  Review of Systems:  HYPERTENSION:  currently on Coreg only and blood pressure appears higher His lisinopril was stopped in the hospital possibly because of his significant renal dysfunction but not clear if he will tolerate ARB drugs  HYPERLIPIDEMIA: The lipid abnormality consists of elevated triglycerides.    He has history of neuropathy  No recent history of edema  He does have history of nephropathy with moderate renal dysfunction and creatinine appears to be back to baseline   Examination:   BP 142/90  Pulse 68   Temp(Src) 98.7 F (37.1 C)  Resp 14  Ht 5\' 8"  (1.727  m)  Wt 401 lb (181.892 kg)  BMI 60.99 kg/m2  SpO2 94%  Body mass index is 60.99 kg/(m^2).   ASSESSMENT/ PLAN::   Diabetes type 2   Recent insulin dose has been reduced significantly during his hospitalization but appears to be getting relatively higher readings in the evenings now Currently taking only 10 units of Lantus in the morning and most likely will need progressively higher doses as before For now will take 20 units in the morning and continue the same mealtime coverage  Postprandial control is difficult to assess as he is not checking blood sugars consistently and may need higher dose of the U-500 insulin at lunch  He should benefit significantly from his proposed gastric bypass surgery because of his morbid obesity, awaiting clearance  Discuss adjustment of both basal and mealtime insulin doses,  glucose monitoring timing and targets, balanced meals and weight loss  HYPERTENSION: He is taking somewhat less medication for his hypertension currently and only on Coreg Blood pressure is significantly high For the time being will start him on Norvasc 5 mg daily in addition to his Coreg and have followup with nephrologist for further management  [facility] 04/10/2013, 1:59 PM

## 2013-04-08 ENCOUNTER — Encounter (HOSPITAL_COMMUNITY): Payer: Medicaid Other

## 2013-04-08 ENCOUNTER — Encounter: Payer: Medicaid Other | Admitting: Vascular Surgery

## 2013-04-08 LAB — COMPLETE METABOLIC PANEL WITH GFR
ALT: 25 U/L (ref 0–53)
Albumin: 3.5 g/dL (ref 3.5–5.2)
Alkaline Phosphatase: 41 U/L (ref 39–117)
BUN: 46 mg/dL — ABNORMAL HIGH (ref 6–23)
CO2: 26 mEq/L (ref 19–32)
Calcium: 8.8 mg/dL (ref 8.4–10.5)
Chloride: 102 mEq/L (ref 96–112)
GFR, Est African American: 46 mL/min — ABNORMAL LOW
GFR, Est Non African American: 40 mL/min — ABNORMAL LOW
Glucose, Bld: 153 mg/dL — ABNORMAL HIGH (ref 70–99)
Potassium: 4.9 mEq/L (ref 3.5–5.3)
Sodium: 140 mEq/L (ref 135–145)
Total Protein: 6.1 g/dL (ref 6.0–8.3)

## 2013-04-08 LAB — CBC WITH DIFFERENTIAL/PLATELET
Basophils Relative: 1 % (ref 0–1)
Eosinophils Absolute: 0.5 10*3/uL (ref 0.0–0.7)
Eosinophils Relative: 4 % (ref 0–5)
HCT: 30.7 % — ABNORMAL LOW (ref 39.0–52.0)
Lymphs Abs: 2.7 10*3/uL (ref 0.7–4.0)
MCH: 26.1 pg (ref 26.0–34.0)
MCHC: 31.9 g/dL (ref 30.0–36.0)
MCV: 81.9 fL (ref 78.0–100.0)
Monocytes Absolute: 0.6 10*3/uL (ref 0.1–1.0)
Neutro Abs: 7.1 10*3/uL (ref 1.7–7.7)
Neutrophils Relative %: 65 % (ref 43–77)
Platelets: 200 10*3/uL (ref 150–400)
WBC: 10.8 10*3/uL — ABNORMAL HIGH (ref 4.0–10.5)

## 2013-04-11 LAB — VITAMIN B12: Vitamin B-12: 1247 pg/mL — ABNORMAL HIGH (ref 211–911)

## 2013-04-11 LAB — FOLATE: Folate: 17.5 ng/mL

## 2013-04-11 LAB — IRON: Iron: 37 ug/dL — ABNORMAL LOW (ref 42–165)

## 2013-04-12 ENCOUNTER — Telehealth: Payer: Self-pay | Admitting: Family Medicine

## 2013-04-12 NOTE — Telephone Encounter (Signed)
Pt calls stating that he has a cold, his has nasal congestion and sore throat from the drainage with no fever and wants to know what he can take or if we can call something in for him? Informed pt to try some OTC Claritin for the drainage and saline nasal spray for the congestion. Also recommended that he continue to take Tylenol and if he gets worse to call us back or go to ER..pt agreed and verbalized understanding.

## 2013-04-18 ENCOUNTER — Telehealth: Payer: Self-pay | Admitting: *Deleted

## 2013-04-18 NOTE — Telephone Encounter (Signed)
Increase PM Lantus to 40

## 2013-04-18 NOTE — Telephone Encounter (Signed)
Instructed patient to increase lantus to 40 units at night.

## 2013-04-18 NOTE — Telephone Encounter (Signed)
Pt says his sugars have been very high in the mornings, he said  you told him to take 20 units of lantus bid, he said his sugars the last 3 days have been Monday 204 Sunday 228 Saturday 181 Friday 207 He says he drinks a protein shake in the morning before he takes his insulin. CB# [phone]

## 2013-04-29 ENCOUNTER — Encounter: Payer: Self-pay | Admitting: Pulmonary Disease

## 2013-04-29 ENCOUNTER — Ambulatory Visit (INDEPENDENT_AMBULATORY_CARE_PROVIDER_SITE_OTHER): Payer: Medicaid Other | Admitting: Pulmonary Disease

## 2013-04-29 VITALS — BP 154/78 | HR 89 | Temp 97.4°F | Ht 68.5 in | Wt 396.0 lb

## 2013-04-29 DIAGNOSIS — G4733 Obstructive sleep apnea (adult) (pediatric): Secondary | ICD-10-CM

## 2013-04-29 NOTE — Patient Instructions (Signed)
Obtain sleep studies from [facility] or from [facility] We will schedule sleep study We will help you to change DME & check download on your machine

## 2013-04-29 NOTE — Progress Notes (Signed)
Subjective:    Patient ID: Alexander Hubbard, male    DOB: [birth year], 57 y.o.   MRN: [id number]  HPI PCP - [facility]- Reather Littler  57 y.o. Morbidly obese male referred for evaluation and management of obstructive sleep apnea.  PMH of CHF, HTN, HL, OSA, and insulin dependent DM adm to [facility] 10/28-11/2/14 for 6 days of diarrhea. Patient was found to have low BP, AG metabolic acidosis, and AKI/ hyperkalemia - due to diarrhea & hypotension. He was treated conservatively with improvement in his encephalopathy and renal parameters.  He is originally from [place], in the [place] since the 70s. [place] in [place] and was placed on CPAP, which appears to be auto CPAP from 5-15 with a full face mask ([facility]). He reports poor compliance due to high pressure and states that he rips off the mask in the middle of the night when the pressure increases to 15 cm. He would like to try different mask interfaces but has had problems with his DME obtaining this and would like to switch DME. He is gained 50 pounds since his last study.  Epworth sleepiness score is 24 Bedtime is around midnight, he is often sleeping in his recliner for a couple of hours prior to this, sleep latency is about 30 minutes, he sleeps on his right side with 3 pillows, he has 4 nocturnal awakenings including nocturia, takes twice a day Lasix, and is out of bed at 9 AM feeling tired with dryness of mouth and occasional headache.  There is no history suggestive of cataplexy, sleep paralysis or parasomnias   Past Medical History  Diagnosis Date  . Diabetes mellitus   . Hypertension   . Obesity   . Sleep apnea   . Gouty arthritis     Past Surgical History  Procedure Laterality Date  . Eye surgery    . Knee surgery      Allergies  Allergen Reactions  . Penicillins Swelling and Rash    History   Social History  . Marital Status: Unknown    Spouse Name: N/A    Number of  Children: N/A  . Years of Education: N/A   Occupational History  . Not on file.   Social History Main Topics  . Smoking status: Never Smoker   . Smokeless tobacco: Never Used  . Alcohol Use: No  . Drug Use: No  . Sexual Activity: Not on file   Other Topics Concern  . Not on file   Social History Narrative  . No narrative on file    Family History  Problem Relation Age of Onset  . Hypertension Sister   . Diabetes Mellitus II Sister        Review of Systems Constitutional: negative for anorexia, fevers and sweats  Eyes: negative for irritation, redness and visual disturbance  Ears, nose, mouth, throat, and face: negative for earaches, epistaxis, nasal congestion and sore throat  Respiratory: negative for cough, dyspnea on exertion, sputum and wheezing  Cardiovascular: negative for chest pain, dyspnea, lower extremity edema, orthopnea, palpitations and syncope  Gastrointestinal: negative for abdominal pain, constipation, diarrhea, melena, nausea and vomiting  Genitourinary:negative for dysuria, frequency and hematuria  Hematologic/lymphatic: negative for bleeding, easy bruising and lymphadenopathy  Musculoskeletal:negative for arthralgias, muscle weakness and stiff joints  Neurological: negative for coordination problems, gait problems, headaches and weakness  Endocrine: negative for diabetic symptoms including polydipsia, polyuria and weight loss     Objective:   Physical  Exam  Gen. Pleasant, obese, in no distress, normal affect ENT - no lesions, no post nasal drip, class 2-3 airway Neck: No JVD, no thyromegaly, no carotid bruits Lungs: no use of accessory muscles, no dullness to percussion, decreased without rales or rhonchi  Cardiovascular: Rhythm regular, heart sounds  normal, no murmurs or gallops, no peripheral edema Abdomen: soft and non-tender, no hepatosplenomegaly, BS normal. Musculoskeletal: No deformities, no cyanosis or clubbing Neuro:  alert, non focal,  no tremors        Assessment & Plan:

## 2013-04-29 NOTE — Assessment & Plan Note (Signed)
He is undergoing evaluation for gastric bypass

## 2013-04-29 NOTE — Assessment & Plan Note (Signed)
Obtain sleep studies from [facility] or from [facility] We will schedule sleep study We will help you to change DME & check download on your machine He may need bipap machine   The pathophysiology of obstructive sleep apnea , it's cardiovascular consequences & modes of treatment including CPAP were discused with the patient in detail & they evidenced understanding. Weight loss encouraged, compliance with goal of at least 4-6 hrs every night is the expectation. Advised against medications with sedative side effects Cautioned against driving when sleepy - understanding that sleepiness will vary on a day to day basis

## 2013-04-30 ENCOUNTER — Other Ambulatory Visit: Payer: Self-pay | Admitting: Family Medicine

## 2013-05-01 ENCOUNTER — Other Ambulatory Visit: Payer: Self-pay | Admitting: Family Medicine

## 2013-05-03 ENCOUNTER — Telehealth: Payer: Self-pay | Admitting: Pulmonary Disease

## 2013-05-03 NOTE — Telephone Encounter (Signed)
We have already received sleep study. Will forward to Dr. Vassie Loll.

## 2013-05-03 NOTE — Telephone Encounter (Signed)
Baseline PSG -feb 2012 - severe OSA  CPAP titration 08/2010 - corrected by CPAP 12 cm, no supine sleep, hence auto 12-15 advised with FF mask

## 2013-05-05 ENCOUNTER — Ambulatory Visit: Payer: Medicaid Other | Admitting: Endocrinology

## 2013-05-11 ENCOUNTER — Ambulatory Visit: Payer: Medicaid Other | Admitting: Podiatrist

## 2013-05-12 ENCOUNTER — Telehealth: Payer: Self-pay | Admitting: *Deleted

## 2013-05-12 ENCOUNTER — Encounter: Payer: Self-pay | Admitting: Vascular Surgery

## 2013-05-12 ENCOUNTER — Ambulatory Visit (INDEPENDENT_AMBULATORY_CARE_PROVIDER_SITE_OTHER): Payer: Medicaid Other | Admitting: Endocrinology

## 2013-05-12 ENCOUNTER — Encounter: Payer: Self-pay | Admitting: Endocrinology

## 2013-05-12 VITALS — BP 136/80 | HR 71 | Temp 98.3°F | Resp 14 | Ht 68.0 in | Wt 390.1 lb

## 2013-05-12 DIAGNOSIS — R5381 Other malaise: Secondary | ICD-10-CM

## 2013-05-12 DIAGNOSIS — D509 Iron deficiency anemia, unspecified: Secondary | ICD-10-CM

## 2013-05-12 DIAGNOSIS — N189 Chronic kidney disease, unspecified: Secondary | ICD-10-CM

## 2013-05-12 DIAGNOSIS — E1149 Type 2 diabetes mellitus with other diabetic neurological complication: Secondary | ICD-10-CM

## 2013-05-12 DIAGNOSIS — N183 Chronic kidney disease, stage 3 unspecified: Secondary | ICD-10-CM | POA: Insufficient documentation

## 2013-05-12 LAB — BASIC METABOLIC PANEL
Calcium: 9.2 mg/dL (ref 8.4–10.5)
Chloride: 107 mEq/L (ref 96–112)
GFR: 29.34 mL/min — ABNORMAL LOW (ref >60.00)
Glucose, Bld: 108 mg/dL — ABNORMAL HIGH (ref 70–99)
Potassium: 4.9 mEq/L (ref 3.5–5.1)
Sodium: 141 mEq/L (ref 135–145)

## 2013-05-12 LAB — TSH: TSH: 1.08 u[IU]/mL (ref 0.35–5.50)

## 2013-05-12 LAB — LIPID PANEL
LDL Cholesterol: 29 mg/dL (ref 0–99)
VLDL: 34.8 mg/dL (ref 0.0–40.0)

## 2013-05-12 MED ORDER — FERROUS FUMARATE-VITAMIN C 65-125 MG PO TABS: 65.0000 mg | ORAL_TABLET | Freq: Every day | ORAL | Status: DC

## 2013-05-12 NOTE — Telephone Encounter (Signed)
Critical lab  BUN is 89

## 2013-05-12 NOTE — Patient Instructions (Signed)
Please check blood sugars at least half the time about 2 hours after any meal and as directed on waking up. Please bring blood sugar monitor to each visit  Lantus 36 in pms

## 2013-05-12 NOTE — Progress Notes (Signed)
Patient ID: Alexander Hubbard, male   DOB: 1956/05/22, 57 y.o.   MRN: [id number]  Alexander Hubbard is an 57 y.o. male.   Reason for Appointment: Diabetes follow-up   History of Present Illness   Diagnosis: Type 2 DIABETES MELITUS, insulin requiring     PAST history: He has been on relatively large doses of insulin for a few years and was not well controlled until he started the U-500 insulin This particularly help his fasting hyperglycemia which was difficult to control. He only benefited partially from Victoza and this did not help him with weight loss significantly A1c had increased in 8/14  Insulin regimen: LANTUS 20 in the morning and 40 in the evening ; U.-500 insulin a.c. T.i.d., 01-05-09  RECENT history:The patient's diabetes control is appearing fairly good now However his blood sugars are being monitored mostly in the morning He does have a little variability  Overall he has been trying more  to be compliant with his diet  He has now lost some weight.  Proper timing of medications in relation to meals: Yes. 30 minutes before eating However occasionally will skip the U-500 insulin if blood sugar is below 100       Monitors blood glucose: Once a day.    Glucometer: Accu-Chek        Blood Glucose readings from meter download: FASTING 102-216, lowest reading today; At 4 PM 94-177 Hypoglycemia:  none recently         Meals: 3 meals per day. his wife usually watches his carbohydrate and fat intake         Physical activity: exercise: Is able to walk a little more recently            Wt Readings from Last 3 Encounters:  05/12/13 390 lb 1.6 oz (176.948 kg)  04/29/13 396 lb (179.624 kg)  04/07/13 401 lb (181.892 kg)   Diabetes labs:  Lab Results  Component Value Date   HGBA1C 7.3* 04/07/2013   HGBA1C 8.0* 01/03/2013   HGBA1C 7.3* 09/22/2012   Lab Results  Component Value Date   MICROALBUR 83.1* 01/03/2013   LDLCALC 20 01/03/2013   CREATININE 1.84* 04/07/2013    PROBLEM 2: He is asking  about his anemia and feeling excessively cold. He also gets somewhat out of breath on exertion He has not been told to start iron. Review of his recent labs show that he is iron deficient and hemoglobin is below 10. B12 is normal. Not clear of the etiology of his iron deficiency     Medication List       This list is accurate as of: 05/12/13 10:51 AM.  Always use your most recent med list.               ammonium lactate 12 % lotion  Commonly known as:  LAC-HYDRIN  APPLY TO LEGS 2 TIMES DAILY     aspirin 325 MG tablet  Take 325 mg by mouth daily.     brinzolamide 1 % ophthalmic suspension  Commonly known as:  AZOPT  Place 1 drop into both eyes 2 (two) times daily.     carvedilol 12.5 MG tablet  Commonly known as:  COREG  Take 12.5 mg by mouth 2 (two) times daily with a meal.     colchicine 0.6 MG tablet  Take 0.6 mg by mouth daily.     CRESTOR 20 MG tablet  Generic drug:  rosuvastatin  TAKE 1 TABLET BY MOUTH DAILY AT BEDTIME  febuxostat 40 MG tablet  Commonly known as:  ULORIC  Take 80 mg by mouth daily.     furosemide 40 MG tablet  Commonly known as:  LASIX  Take 40 mg by mouth 2 (two) times daily.     gabapentin 300 MG capsule  Commonly known as:  NEURONTIN  Take 2 capsules (600 mg total) by mouth 3 (three) times daily.     Insulin Glargine 100 UNIT/ML Sopn  Inject 20 units in the morning and 40 units at night.     insulin regular human CONCENTRATED 500 UNIT/ML Soln injection  Commonly known as:  HUMULIN R  Inject 0.01-0.02 mLs (5-10 Units total) into the skin 3 (three) times daily with meals. Inject 8 units in AM, 6 units at Lunch, and 10 units at dinner.     Insulin Syringe-Needle U-100 30G X 1/2" 0.3 ML Misc  Commonly known as:  B-D INS SYR ULTRAFINE .3CC/30G  USE AS DIRECTED 3 TIMES A DAY. DX CODE: 250.00     latanoprost 0.005 % ophthalmic solution  Commonly known as:  XALATAN  Place 1 drop into the left eye at bedtime.     lisinopril 10 MG  tablet  Commonly known as:  PRINIVIL,ZESTRIL  TAKE 1 TABLET BY MOUTH DAILY     niacin 1000 MG CR tablet  Commonly known as:  NIASPAN  Take 1,000 mg by mouth at bedtime.     pantoprazole 40 MG tablet  Commonly known as:  PROTONIX  Take 1 tablet (40 mg total) by mouth 2 (two) times daily.     prednisoLONE acetate 1 % ophthalmic suspension  Commonly known as:  PRED FORTE  Place 1 drop into the right eye at bedtime.     timolol 0.5 % ophthalmic solution  Commonly known as:  TIMOPTIC  Place 1 drop into both eyes 2 (two) times daily.     vitamin B-12 1000 MCG tablet  Commonly known as:  CYANOCOBALAMIN  Take 1,000 mcg by mouth daily.        Allergies:  Allergies  Allergen Reactions  . Penicillins Swelling and Rash    Past Medical History  Diagnosis Date  . Diabetes mellitus   . Hypertension   . Obesity   . Sleep apnea   . Gouty arthritis     Past Surgical History  Procedure Laterality Date  . Eye surgery    . Knee surgery      Family History  Problem Relation Age of Onset  . Hypertension Sister   . Diabetes Mellitus II Sister     Social History:  reports that he has never smoked. He has never used smokeless tobacco. He reports that he does not drink alcohol or use illicit drugs.  Review of Systems:  HYPERTENSION:  currently on Coreg and lisinopril, currently being treated by nephrologist  HYPERLIPIDEMIA: The lipid abnormality consists of elevated triglycerides, labs pending from today. Not on any medications.triglycerides but his taking Crestor for cardiovascular prophylaxis  Lab Results  Component Value Date   CHOL 90 01/03/2013   HDL 31.50* 01/03/2013   LDLCALC 20 01/03/2013   TRIG 191.0* 01/03/2013   CHOLHDL 3 01/03/2013   He has history of neuropathy  He does have history of nephropathy with moderate renal dysfunction and creatinine was 1.8 on the last measurement    Examination:   BP 136/80  Pulse 71  Temp(Src) 98.3 F (36.8 C)  Resp 14  Ht 5\' 8"   (1.727 m)  Wt 390 lb  1.6 oz (176.948 kg)  BMI 59.33 kg/m2  SpO2 98%  Body mass index is 59.33 kg/(m^2).   ASSESSMENT/ PLAN::   Diabetes type 2   His blood sugars appear to be fairly good now and overall he is still using lower doses of Lantus than prior to his hospitalization Since his morning readings are trending lower will reduce the evening dose by 5 units for now Discussed needing to check more readings after meals as he has not done any Also advised him not to SKIP the mealtime dose for normal blood sugars but reduce the dose by 2 units   HYPERTENSION: Better controlled now, taking lisinopril. Also no recent edema  CKD: Now managed by nephrologist To have basic metabolic panel today  ANEMIA: Has not started any iron, had iron deficiency anemia with symptoms. He prefers to take a prescription iron and ferrous fumarate with vitamin D was sent in. He will need to followup with PCP  Hyperlipidemia: He tends to have high triglycerides and low HDL, labs to be checked today  [facility] 05/12/2013, 10:51 AM

## 2013-05-12 NOTE — Telephone Encounter (Signed)
noted 

## 2013-05-13 ENCOUNTER — Encounter: Payer: Medicaid Other | Admitting: Vascular Surgery

## 2013-05-13 ENCOUNTER — Encounter: Payer: Self-pay | Admitting: Vascular Surgery

## 2013-05-13 ENCOUNTER — Encounter (HOSPITAL_COMMUNITY): Payer: Medicaid Other

## 2013-05-13 ENCOUNTER — Ambulatory Visit (HOSPITAL_COMMUNITY)
Admission: RE | Admit: 2013-05-13 | Discharge: 2013-05-13 | Disposition: A | Discharge status: Home / Self Care | Payer: Medicaid Other | Source: Ambulatory Visit | Attending: Vascular Surgery | Admitting: Vascular Surgery

## 2013-05-13 ENCOUNTER — Ambulatory Visit (INDEPENDENT_AMBULATORY_CARE_PROVIDER_SITE_OTHER): Payer: Medicaid Other | Admitting: Vascular Surgery

## 2013-05-13 VITALS — BP 120/46 | HR 71 | Ht 68.0 in | Wt 391.0 lb

## 2013-05-13 DIAGNOSIS — M7989 Other specified soft tissue disorders: Secondary | ICD-10-CM

## 2013-05-13 DIAGNOSIS — M79609 Pain in unspecified limb: Secondary | ICD-10-CM | POA: Insufficient documentation

## 2013-05-13 DIAGNOSIS — I83893 Varicose veins of bilateral lower extremities with other complications: Secondary | ICD-10-CM | POA: Insufficient documentation

## 2013-05-13 DIAGNOSIS — I872 Venous insufficiency (chronic) (peripheral): Secondary | ICD-10-CM

## 2013-05-13 NOTE — Progress Notes (Signed)
[facility] [place]  Referred by:  Alexander Brooks, MD [id number] [facility] [address] [place], [place] [address]  Reason for referral: Swollen bilateral legs  History of Present Illness  Alexander Hubbard is a 57 y.o. (07/23/1955) male who presents with chief complaint: B swollen legs.  Patient notes, onset of years ago, associated with no obvious trigger.  The patient's symptoms include: edema, bursting sensation, blistering and wounds..  The patient has had multiple episodes of wounds with blistering while severely swollen.  The patient has undergone multiple modalities for treatment of his venous ulcers including wound care treatment, compressive therapy, and mechanical SCD.  The patient has had no history of DVT, known history of varicose vein, known history of venous stasis ulcers, no history of  Lymphedema and known history of skin changes in lower legs.  There is no family history of venous disorders.  The patient has used both medical grade compression stockings and thigh high SCD in the past.  Pt notes his in the process of being scheduled for gastric bypass  Past Medical History  Diagnosis Date  . Diabetes mellitus   . Hypertension   . Obesity   . Sleep apnea   . Gouty arthritis     Past Surgical History  Procedure Laterality Date  . Eye surgery    . Knee surgery      History   Social History  . Marital Status: Unknown    Spouse Name: N/A    Number of Children: N/A  . Years of Education: N/A   Occupational History  . Not on file.   Social History Main Topics  . Smoking status: Never Smoker   . Smokeless tobacco: Never Used  . Alcohol Use: No  . Drug Use: No  . Sexual Activity: Not on file   Other Topics Concern  . Not on file   Social History Narrative  . No narrative on file    Family History  Problem Relation Age of Onset  . Hypertension Sister   . Diabetes Mellitus II Sister    Current Outpatient Prescriptions on File Prior to Visit   Medication Sig Dispense Refill  . ammonium lactate (LAC-HYDRIN) 12 % lotion APPLY TO LEGS 2 TIMES DAILY  57 g  4  . aspirin 325 MG tablet Take 325 mg by mouth daily.      . brinzolamide (AZOPT) 1 % ophthalmic suspension Place 1 drop into both eyes 2 (two) times daily.        . carvedilol (COREG) 12.5 MG tablet Take 12.5 mg by mouth 2 (two) times daily with a meal.      . colchicine 0.6 MG tablet Take 0.6 mg by mouth daily.      . CRESTOR 20 MG tablet TAKE 1 TABLET BY MOUTH DAILY AT BEDTIME  30 tablet  2  . febuxostat (ULORIC) 40 MG tablet Take 80 mg by mouth daily.      . Ferrous Fumarate-Vitamin C 65-125 MG TABS Take 65 mg by mouth daily.  30 tablet  4  . furosemide (LASIX) 40 MG tablet Take 40 mg by mouth 2 (two) times daily.      Marland Kitchen gabapentin (NEURONTIN) 300 MG capsule Take 2 capsules (600 mg total) by mouth 3 (three) times daily.  180 capsule  3  . Insulin Glargine 100 UNIT/ML SOPN Inject 20 units in the morning and 40 units at night.      . insulin regular human CONCENTRATED (HUMULIN R)  500 UNIT/ML SOLN injection Inject 0.01-0.02 mLs (5-10 Units total) into the skin 3 (three) times daily with meals. Inject 8 units in AM, 6 units at Lunch, and 10 units at dinner.  1 vial  5  . Insulin Syringe-Needle U-100 (B-D INS SYR ULTRAFINE .3CC/30G) 30G X 1/2" 0.3 ML MISC USE AS DIRECTED 3 TIMES A DAY. DX CODE: 250.00  100 each  3  . latanoprost (XALATAN) 0.005 % ophthalmic solution Place 1 drop into the left eye at bedtime.       Marland Kitchen lisinopril (PRINIVIL,ZESTRIL) 10 MG tablet TAKE 1 TABLET BY MOUTH DAILY  30 tablet  2  . niacin (NIASPAN) 1000 MG CR tablet Take 1,000 mg by mouth at bedtime.      . pantoprazole (PROTONIX) 40 MG tablet Take 1 tablet (40 mg total) by mouth 2 (two) times daily.  60 tablet  3  . prednisoLONE acetate (PRED FORTE) 1 % ophthalmic suspension Place 1 drop into the right eye at bedtime.       . timolol (TIMOPTIC) 0.5 % ophthalmic solution Place 1 drop into both eyes 2 (two) times  daily.      . vitamin B-12 (CYANOCOBALAMIN) 1000 MCG tablet Take 1,000 mcg by mouth daily.         Current Facility-Administered Medications on File Prior to Visit  Medication Dose Route Frequency Provider Last Rate Last Dose  . pneumococcal 13-valent conjugate vaccine (PREVNAR 13) injection 0.5 mL  0.5 mL Intramuscular Once Alexander Brooks, MD        Allergies  Allergen Reactions  . Penicillins Swelling and Rash    REVIEW OF SYSTEMS:  (Positives checked otherwise negative)  CARDIOVASCULAR:  []  chest pain, []  chest pressure, []  palpitations, []  shortness of breath when laying flat, [x]  shortness of breath with exertion,  [x]  pain in feet when walking, []  pain in feet when laying flat, []  history of blood clot in veins (DVT), []  history of phlebitis, [x]  swelling in legs, []  varicose veins  PULMONARY:  []  productive cough, []  asthma, []  wheezing  NEUROLOGIC:  []  weakness in arms or legs, []  numbness in arms or legs, []  difficulty speaking or slurred speech, []  temporary loss of vision in one eye, []  dizziness  HEMATOLOGIC:  []  bleeding problems, []  problems with blood clotting too easily  MUSCULOSKEL:  []  joint pain, []  joint swelling  GASTROINTEST:  []  vomiting blood, []  blood in stool     GENITOURINARY:  []  burning with urination, []  blood in urine  PSYCHIATRIC:  []  history of major depression  INTEGUMENTARY:  []  rashes, []  ulcers  CONSTITUTIONAL:  []  fever, []  chills  For VQI Use Only  PRE-ADM LIVING: Home  AMB STATUS: Ambulatory  CAD Sx: None  PRIOR CHF: None  STRESS TEST: [x]  No, [ ]  Normal, [ ]  + ischemia, [ ]  + MI, [ ]  Both  Physical Examination Filed Vitals:   05/13/13 1138  BP: 120/46  Pulse: 71  Height: 5\' 8"  (1.727 m)  Weight: 391 lb (177.356 kg)  SpO2: 100%   Body mass index is 59.46 kg/(m^2).  General: A&O x 3, WD, morbidly obese  Head: [place]/AT  Ear/Nose/Throat: Hearing grossly intact, nares w/o erythema or drainage, oropharynx w/o  Erythema/Exudate  Eyes: PERRLA, EOMI  Neck: Supple, no nuchal rigidity, no palpable LAD  Pulmonary: Sym exp, good air movt, CTAB, no rales, rhonchi, & wheezing  Cardiac: RRR, Nl S1, S2, no Murmurs, rubs or gallops  Vascular: Vessel Right Left  Radial  Palpable Palpable  Brachial Palpable Palpable  Carotid Palpable, without bruit Palpable, without bruit  Aorta Not palpable N/A  Femoral Difficult to palpate due to pannus Difficult to palpate due to pannus  Popliteal Not palpable Not palpable  PT Faintly Palpable Faintly Palpable  DP Faintly Palpable Faintly Palpable   Gastrointestinal: soft, NTND, -G/R, - HSM, - masses, - CVAT B, aorta not palpable due obesity  Musculoskeletal: M/S 5/5 throughout , Extremities without ischemic changes , evidence of healed ulcer in R medial ankle, B severe LDS, edema 2+, varicosities palpable  Neurologic: CN 2-12 intact , Pain and light touch intact in extremities except decrease sensation in both feet, Motor exam as listed above  Psychiatric: Judgment intact, Mood & affect appropriate for pt's clinical situation  Dermatologic: See M/S exam for extremity exam, no rashes otherwise noted  Lymph : No Cervical, Axillary, or Inguinal lymphadenopathy   Non-Invasive Vascular Imaging  RLE Venous Duplex (Date: 08/27/12):   RLE: no DVT and SVT, GSV reflux > 0.5 sec  ABI (Date: 10/29/12)  R: 0.90, DP: tri, PT: tri, TBI: 0.52  L: [place], DP: tri, PT: tri, TBI: 0.49  RLE Venous Insufficiency Duplex (Date: 05/13/2013):   Poor exam due to induration  Could not visualize deep system well due to body habitus  Posterior GSV appears to primary conduit; + reflux  SSV poorly visualized but reflux suspected  Outside Studies/Documentation 6 pages of outside documents were reviewed including: prior RLE venous duplex and BLE ABI  Medical Decision Making  Teal Luppino is a 57 y.o. male who presents with: BLE chronic venous insufficiency (C5), varciose veins  with complications   Based on the patient's history and examination, I recommend: referral to Vein Clinic for re-evaluation for possible stab phlebectomy vs EVLA R GSV.  I suspect L GSV to also have significant reflux.  The patient has failed months of multiple modalities of compression with complications including skin shear from the compression stockings.  I suspect he will under go his gastric bypass before his Vein Clinic evaluation.  I recommend: TED hose with SCD use in the immediate post-operative period to help with his DVT risks.  Thank you for allowing [place] to participate in this patient's care.  Alexander Sake, MD [facility] [place] Office: [phone] Pager: [phone]  05/13/2013, 12:54 PM

## 2013-05-29 ENCOUNTER — Ambulatory Visit (HOSPITAL_BASED_OUTPATIENT_CLINIC_OR_DEPARTMENT_OTHER): Payer: Medicaid Other | Attending: Pulmonary Disease

## 2013-06-01 ENCOUNTER — Encounter: Payer: Self-pay | Admitting: Podiatrist

## 2013-06-01 ENCOUNTER — Ambulatory Visit (INDEPENDENT_AMBULATORY_CARE_PROVIDER_SITE_OTHER): Payer: Medicaid Other | Admitting: Podiatrist

## 2013-06-01 VITALS — BP 148/71 | HR 70 | Resp 16

## 2013-06-01 DIAGNOSIS — L97509 Non-pressure chronic ulcer of other part of unspecified foot with unspecified severity: Secondary | ICD-10-CM

## 2013-06-01 DIAGNOSIS — B351 Tinea unguium: Secondary | ICD-10-CM

## 2013-06-01 DIAGNOSIS — M79609 Pain in unspecified limb: Secondary | ICD-10-CM

## 2013-06-01 MED ORDER — CLINDAMYCIN HCL 300 MG PO CAPS: 300.0000 mg | ORAL_CAPSULE | Freq: Three times a day (TID) | ORAL | Status: DC

## 2013-06-01 MED ORDER — SILVER SULFADIAZINE 1 % EX CREA: 1.0000 "application " | TOPICAL_CREAM | Freq: Every day | CUTANEOUS | Status: DC

## 2013-06-01 NOTE — Progress Notes (Signed)
Subjective: Patient presents today with his wife for foot and nail care. The patient relates he recently had an injury on the left foot and developed a lesion on the top of the left third toe. He also states he's been applying antibiotic ointment to the toe and has noticed no improvement in symptoms. Patient also has painful thickened toenails of which she would like debrided at today's visit as well.  Objective: Neurovascular status is unchanged with neuropathy present and faintly palpable pedal pulses bilateral. The patient has absent hair growth and capillary refill time which is decreased bilateral. Patient's toenails are elongated, thickened and dystrophic. He has superficial excoriations noted on the left second, third, and mildly fourth digits. A necrotic appearance is present superficially to these lesions. He did not appear to be infected however he has generalized redness to the left foot which is concerning.  Assessment: Cellulitis, ulceration left foot dorsal digits 2, 3, 4; symptomatic mycotic nails  Plan: Remove the necrotic tissue as best as I could. Applied wound wash and Iodosorb and a dry sterile compressive dressing. Gave patient instructions for home care of the ulceration. Prescribed Silvadene cream and clindamycin oral therapy. Also debrided the patient's nails without complication. I will see him back in one week for followup. May need reevaluation with Dr. Imogene Burn at [facility] for wound healing potential of this left foot

## 2013-06-01 NOTE — Patient Instructions (Addendum)
Instructions for Wound Care  The most important step to healing a foot wound is to reduce the pressure on your foot -  Cleanse your foot with saline wash or warm soapy water (dial antibacterial soap or similar).  Blot dry.  Apply prescribed medication to your wound and cover with gauze and a bandage.  May hold bandage in place with Coban (self sticky wrap), Ace bandage or tape.  You may find dressing supplies at your local [employer], [employer], drug store or medical supply store.  Your prescribed topical medication is :  Silvadene Cream (twice daily)  If you notice any foul odor, increase in pain, pus, increased swelling, red streaks or generalized redness occurring in your foot or leg-Call our office immediately to be seen.  This may be a sign of a limb or life threatening infection that will need prompt attention.  Marlowe Aschoff, DPM  [facility]  [phone] [place]  [phone] [facility]

## 2013-06-03 ENCOUNTER — Encounter: Payer: Self-pay | Admitting: Family Medicine

## 2013-06-03 ENCOUNTER — Ambulatory Visit (INDEPENDENT_AMBULATORY_CARE_PROVIDER_SITE_OTHER): Payer: Medicaid Other | Admitting: Family Medicine

## 2013-06-03 VITALS — BP 136/74 | HR 78 | Temp 98.2°F | Resp 18 | Wt 392.0 lb

## 2013-06-03 DIAGNOSIS — J019 Acute sinusitis, unspecified: Secondary | ICD-10-CM

## 2013-06-03 DIAGNOSIS — L97509 Non-pressure chronic ulcer of other part of unspecified foot with unspecified severity: Secondary | ICD-10-CM

## 2013-06-03 MED ORDER — FLUTICASONE PROPIONATE 50 MCG/ACT NA SUSP: 2.0000 | Freq: Every day | NASAL | Status: DC

## 2013-06-03 MED ORDER — AZITHROMYCIN 250 MG PO TABS: ORAL_TABLET | ORAL | Status: DC

## 2013-06-03 NOTE — Progress Notes (Signed)
Subjective:    Patient ID: Alexander Hubbard, male    DOB: [birth year], 58 y.o.   MRN: [id number]  HPI  Patient presents with one week of sinus pressure and his maxillary sinuses, his final sinuses, postnasal drip, rhinorrhea, and fever. The patient denies any sore throat or shortness of breath. He is trying over-the-counter decongestants without relief. Approximately one week ago, the table fell on his left foot causing an abrasion to the dorsum of his third toe. He has a 10 mm x 7 mm ulcer on the dorsum of his third toe overlying the PIP joint. He has seen the podiatrist and they prescribing Silvadene cream and also patient on clindamycin. Past Medical History  Diagnosis Date  . Diabetes mellitus   . Hypertension   . Obesity   . Sleep apnea   . Gouty arthritis    Current Outpatient Prescriptions on File Prior to Visit  Medication Sig Dispense Refill  . ammonium lactate (LAC-HYDRIN) 12 % lotion APPLY TO LEGS 2 TIMES DAILY  57 g  4  . aspirin 325 MG tablet Take 325 mg by mouth daily.      . brinzolamide (AZOPT) 1 % ophthalmic suspension Place 1 drop into both eyes 2 (two) times daily.        . carvedilol (COREG) 12.5 MG tablet Take 12.5 mg by mouth 2 (two) times daily with a meal.      . clindamycin (CLEOCIN) 300 MG capsule Take 1 capsule (300 mg total) by mouth 3 (three) times daily.  30 capsule  1  . colchicine 0.6 MG tablet Take 0.6 mg by mouth daily.      . CRESTOR 20 MG tablet TAKE 1 TABLET BY MOUTH DAILY AT BEDTIME  30 tablet  2  . febuxostat (ULORIC) 40 MG tablet Take 80 mg by mouth daily.      . Ferrous Fumarate-Vitamin C 65-125 MG TABS Take 65 mg by mouth daily.  30 tablet  4  . furosemide (LASIX) 40 MG tablet Take 40 mg by mouth 2 (two) times daily.      Marland Kitchen. gabapentin (NEURONTIN) 300 MG capsule Take 2 capsules (600 mg total) by mouth 3 (three) times daily.  180 capsule  3  . Insulin Glargine 100 UNIT/ML SOPN Inject 20 units in the morning and 40 units at night.      . insulin  regular human CONCENTRATED (HUMULIN R) 500 UNIT/ML SOLN injection Inject 0.01-0.02 mLs (5-10 Units total) into the skin 3 (three) times daily with meals. Inject 8 units in AM, 6 units at Lunch, and 10 units at dinner.  1 vial  5  . Insulin Syringe-Needle U-100 (B-D INS SYR ULTRAFINE .3CC/30G) 30G X 1/2" 0.3 ML MISC USE AS DIRECTED 3 TIMES A DAY. DX CODE: 250.00  100 each  3  . latanoprost (XALATAN) 0.005 % ophthalmic solution Place 1 drop into the left eye at bedtime.       Marland Kitchen. lisinopril (PRINIVIL,ZESTRIL) 10 MG tablet TAKE 1 TABLET BY MOUTH DAILY  30 tablet  2  . niacin (NIASPAN) 1000 MG CR tablet Take 1,000 mg by mouth at bedtime.      . pantoprazole (PROTONIX) 40 MG tablet Take 1 tablet (40 mg total) by mouth 2 (two) times daily.  60 tablet  3  . prednisoLONE acetate (PRED FORTE) 1 % ophthalmic suspension Place 1 drop into the right eye at bedtime.       . silver sulfADIAZINE (SILVADENE) 1 % cream Apply 1 application  topically daily.  50 g  0  . timolol (TIMOPTIC) 0.5 % ophthalmic solution Place 1 drop into both eyes 2 (two) times daily.      . vitamin B-12 (CYANOCOBALAMIN) 1000 MCG tablet Take 1,000 mcg by mouth daily.         Current Facility-Administered Medications on File Prior to Visit  Medication Dose Route Frequency Provider Last Rate Last Dose  . pneumococcal 13-valent conjugate vaccine (PREVNAR 13) injection 0.5 mL  0.5 mL Intramuscular Once Donita Brooks, MD       Allergies  Allergen Reactions  . Penicillins Swelling and Rash   History   Social History  . Marital Status: Unknown    Spouse Name: N/A    Number of Children: N/A  . Years of Education: N/A   Occupational History  . Not on file.   Social History Main Topics  . Smoking status: Never Smoker   . Smokeless tobacco: Never Used  . Alcohol Use: No  . Drug Use: No  . Sexual Activity: Not on file   Other Topics Concern  . Not on file   Social History Narrative  . No narrative on file     Review of  Systems  All other systems reviewed and are negative.       Objective:   Physical Exam  Vitals reviewed. HENT:  Right Ear: Tympanic membrane, external ear and ear canal normal.  Left Ear: Tympanic membrane, external ear and ear canal normal.  Nose: Mucosal edema and rhinorrhea present. Right sinus exhibits frontal sinus tenderness. Right sinus exhibits no maxillary sinus tenderness. Left sinus exhibits frontal sinus tenderness. Left sinus exhibits no maxillary sinus tenderness.  Mouth/Throat: Oropharynx is clear and moist.  Eyes: Conjunctivae are normal. No scleral icterus.  Neck: Neck supple.  Cardiovascular: Normal rate, regular rhythm and normal heart sounds.   Pulmonary/Chest: Effort normal and breath sounds normal. No respiratory distress. He has no wheezes. He has no rales.  Abdominal: Soft. Bowel sounds are normal. He exhibits no distension. There is no tenderness. There is no rebound.  Lymphadenopathy:    He has no cervical adenopathy.   7 millimeter by 10 mm ulcer on the dorsum of the third toe overlying the PIP joint on left foot.       Assessment & Plan:  1. Acute rhinosinusitis - azithromycin (ZITHROMAX) 250 MG tablet; 2 tabs poqday 1, 1 tab poqday 2-5  Dispense: 6 tablet; Refill: 0 - fluticasone (FLONASE) 50 MCG/ACT nasal spray; Place 2 sprays into both nostrils daily.  Dispense: 16 g; Refill: 6  2. Toe ulcer Recommended daily dressing changes. I covered the wound and Silvadene. I then covered the wound with nonadherent gauze and wrapped the foot and toe pain. I demonstrated dressing changes to the patient and his wife and recommended daily dressing changes. Follow up in one week or sooner if worse

## 2013-06-06 ENCOUNTER — Other Ambulatory Visit: Payer: Self-pay | Admitting: *Deleted

## 2013-06-06 MED ORDER — GLUCOSE BLOOD VI STRP: ORAL_STRIP | Status: DC

## 2013-06-07 ENCOUNTER — Telehealth: Payer: Self-pay | Admitting: Family Medicine

## 2013-06-07 ENCOUNTER — Encounter (HOSPITAL_BASED_OUTPATIENT_CLINIC_OR_DEPARTMENT_OTHER): Payer: Medicaid Other | Attending: General Surgery

## 2013-06-07 MED ORDER — BENZONATATE 200 MG PO CAPS: 200.0000 mg | ORAL_CAPSULE | Freq: Three times a day (TID) | ORAL | Status: DC | PRN

## 2013-06-07 NOTE — Telephone Encounter (Signed)
He is on two different abx, I do not feel he needs further abx.  He can have tessalon perles 200 mg po q 8 hrs prrn cough, 30.

## 2013-06-07 NOTE — Telephone Encounter (Signed)
Still sick from Friday.  Still with bad cough.  Wants something for cough, getting worse.

## 2013-06-07 NOTE — Telephone Encounter (Signed)
Rx to pharmacy and patient aware 

## 2013-06-16 ENCOUNTER — Ambulatory Visit: Payer: Medicaid Other | Admitting: Podiatrist

## 2013-06-17 ENCOUNTER — Other Ambulatory Visit: Payer: Self-pay | Admitting: Family Medicine

## 2013-06-17 DIAGNOSIS — E119 Type 2 diabetes mellitus without complications: Secondary | ICD-10-CM

## 2013-06-22 ENCOUNTER — Encounter: Payer: Self-pay | Admitting: Pulmonary Disease

## 2013-06-22 ENCOUNTER — Telehealth: Payer: Self-pay | Admitting: *Deleted

## 2013-06-22 ENCOUNTER — Ambulatory Visit (HOSPITAL_BASED_OUTPATIENT_CLINIC_OR_DEPARTMENT_OTHER): Payer: Medicaid Other | Attending: Pulmonary Disease | Admitting: Radiology

## 2013-06-22 ENCOUNTER — Ambulatory Visit (INDEPENDENT_AMBULATORY_CARE_PROVIDER_SITE_OTHER): Payer: Medicaid Other | Admitting: Pulmonary Disease

## 2013-06-22 ENCOUNTER — Other Ambulatory Visit: Payer: Self-pay | Admitting: Pulmonary Disease

## 2013-06-22 VITALS — BP 124/70 | HR 74 | Temp 98.8°F | Ht 68.5 in | Wt 394.0 lb

## 2013-06-22 DIAGNOSIS — G4733 Obstructive sleep apnea (adult) (pediatric): Secondary | ICD-10-CM

## 2013-06-22 DIAGNOSIS — J209 Acute bronchitis, unspecified: Secondary | ICD-10-CM

## 2013-06-22 DIAGNOSIS — Z9989 Dependence on other enabling machines and devices: Principal | ICD-10-CM

## 2013-06-22 MED ORDER — PREDNISONE 10 MG PO TABS: ORAL_TABLET | ORAL | Status: DC

## 2013-06-22 NOTE — Assessment & Plan Note (Signed)
Go over to the sleep [id number] for mask fitting We will schedule CPAP titration study & ask DME to help you  Weight loss encouraged, compliance with goal of at least 4-6 hrs every night is the expectation. Advised against medications with sedative side effects Cautioned against driving when sleepy - understanding that sleepiness will vary on a day to day basis

## 2013-06-22 NOTE — Telephone Encounter (Signed)
I have placed order in [facility] and will forward to Dr. Vassie LollAlva so he is aware.

## 2013-06-22 NOTE — Telephone Encounter (Signed)
Order has been placed.

## 2013-06-22 NOTE — Assessment & Plan Note (Signed)
Prednisone 10 mg tabs  Take 2 tabs daily with food x 5ds, then 1 tab daily with food x 5ds then STOP DELSYM cough syrup 2 tsp thrice daily We discussed effect on sugars

## 2013-06-22 NOTE — Telephone Encounter (Signed)
[facility]

## 2013-06-22 NOTE — Patient Instructions (Signed)
Go over to the sleep [phone] for mask fitting We will schedule CPAP titration study & ask DME to help you  Prednisone 10 mg tabs  Take 2 tabs daily with food x 5ds, then 1 tab daily with food x 5ds then STOP DELSYM cough syrup 2 tsp thrice daily

## 2013-06-22 NOTE — Progress Notes (Signed)
   Subjective:    Patient ID: Alexander BouquetRiyad Hubbard, male    DOB: 08/15/1955, 58 y.o.   MRN: [id number]  HPI  PCP - Pickard  Endocrine- Reather LittlerAjay Kumar    58 y.o. Morbidly obese male referred for evaluation and management of obstructive sleep apnea.  PMH of CHF, HTN, HL, OSA, and insulin dependent DM Baseline PSG -feb 2012 - severe OSA  CPAP titration 08/2010 - corrected by CPAP 12 cm, no supine sleep, hence auto 12-15 advised with FF mask   [facility] for 6 days of diarrhea. Patient was found to have low BP, AG metabolic acidosis, and AKI/ hyperkalemia - due to diarrhea & hypotension. He was treated conservatively with improvement in his encephalopathy and renal parameters.  [place] and was placed on CPAP, which appears to be auto CPAP from 5-15 with a full face mask ([employer]). He reports poor compliance due to high pressure and states that he rips off the mask in the middle of the night when the pressure increases to 15 cm. He would like to try different mask interfaces but has had problems with his DME obtaining this and would like to switch DME. He is gained 50 pounds since his last study.  Epworth sleepiness score is 24  Bedtime is around midnight, he is often sleeping in his recliner for a couple of hours prior to this, sleep latency is about 30 minutes, he sleeps on his right side with 3 pillows, he has 4 nocturnal awakenings including nocturia, takes twice a day Lasix, and is out of bed at 9 AM feeling tired with dryness of mouth and occasional headache.   06/22/2013  Chief Complaint  Patient presents with  . Follow-up    2 month rov.  Pt c/o white mucous at night coming out of mouth when sleeping, prod cough with white mucous.  Pt's wife states he stops breathing at night. has not worn cpap since last month due to cough/congestion.  Needs new mask.    Unable to use  FF mask due to cough -needs a different mask Was not contacted by DME Never went for mask fit -he was desensitized with an oro-nasal mask today  Review of Systems neg for any significant sore throat, dysphagia, itching, sneezing, nasal congestion or excess/ purulent secretions, fever, chills, sweats, unintended wt loss, pleuritic or exertional cp, hempoptysis, orthopnea pnd or change in chronic leg swelling. Also denies presyncope, palpitations, heartburn, abdominal pain, nausea, vomiting, diarrhea or change in bowel or urinary habits, dysuria,hematuria, rash, arthralgias, visual complaints, headache, numbness weakness or ataxia.     Objective:   Physical Exam  Gen. Pleasant, obese, in no distress, normal affect ENT - no lesions, no post nasal drip, class 2-3 airway Neck: No JVD, no thyromegaly, no carotid bruits Lungs: no use of accessory muscles, no dullness to percussion, decreased without rales or rhonchi  Cardiovascular: Rhythm regular, heart sounds  normal, no murmurs or gallops, no peripheral edema Abdomen: soft and non-tender, no hepatosplenomegaly, BS normal. Musculoskeletal: No deformities, no cyanosis or clubbing Neuro:  alert, non focal, no tremors       Assessment & Plan:

## 2013-06-22 NOTE — Telephone Encounter (Signed)
Message copied by Tommie SamsSILVA, MINDY S on Wed Jun 22, 2013  2:27 PM ------      Message from: [place], [place]      Created: Wed Jun 22, 2013  1:56 PM      Regarding: order for [employer]       Vernon called and stated that he fitted Mr. [place] CallasSharma with a [employer] with Large Nasal Pillows and Medium Oral Mask.  Please let Dr. Vassie LollAlva know.            Marita [place] is also requesting that an order be placed so that he can document this inside the order.            Thanks so much!      Rhonda ------

## 2013-06-24 ENCOUNTER — Ambulatory Visit: Payer: Medicaid Other | Admitting: Family Medicine

## 2013-07-06 ENCOUNTER — Other Ambulatory Visit: Payer: Self-pay | Admitting: *Deleted

## 2013-07-06 ENCOUNTER — Other Ambulatory Visit: Payer: Self-pay | Admitting: Family Medicine

## 2013-07-06 ENCOUNTER — Other Ambulatory Visit: Payer: Self-pay | Admitting: Pulmonary Disease

## 2013-07-06 MED ORDER — ROSUVASTATIN CALCIUM 20 MG PO TABS: ORAL_TABLET | ORAL | Status: DC

## 2013-07-06 MED ORDER — "INSULIN SYRINGE-NEEDLE U-100 30G X 1/2"" 0.3 ML MISC": Status: DC

## 2013-07-06 NOTE — Telephone Encounter (Signed)
Rx Refilled  

## 2013-07-12 ENCOUNTER — Other Ambulatory Visit: Payer: Medicaid Other

## 2013-07-15 ENCOUNTER — Ambulatory Visit: Payer: Medicaid Other | Admitting: Endocrinology

## 2013-07-29 LAB — HEMOGLOBIN A1C: Hgb A1c MFr Bld: 7.2 % — AB (ref 4.0–6.0)

## 2013-07-31 ENCOUNTER — Other Ambulatory Visit: Payer: Self-pay | Admitting: Family Medicine

## 2013-08-01 ENCOUNTER — Encounter: Payer: Self-pay | Admitting: Family Medicine

## 2013-08-01 ENCOUNTER — Ambulatory Visit (INDEPENDENT_AMBULATORY_CARE_PROVIDER_SITE_OTHER): Payer: Medicaid Other | Admitting: Family Medicine

## 2013-08-01 VITALS — BP 130/82 | HR 78 | Temp 97.2°F | Resp 18 | Wt 380.0 lb

## 2013-08-01 DIAGNOSIS — S91009A Unspecified open wound, unspecified ankle, initial encounter: Secondary | ICD-10-CM

## 2013-08-01 DIAGNOSIS — S81812A Laceration without foreign body, left lower leg, initial encounter: Secondary | ICD-10-CM

## 2013-08-01 DIAGNOSIS — S81009A Unspecified open wound, unspecified knee, initial encounter: Secondary | ICD-10-CM

## 2013-08-01 DIAGNOSIS — S81809A Unspecified open wound, unspecified lower leg, initial encounter: Secondary | ICD-10-CM

## 2013-08-01 DIAGNOSIS — S81811A Laceration without foreign body, right lower leg, initial encounter: Secondary | ICD-10-CM

## 2013-08-01 NOTE — Progress Notes (Signed)
Subjective:    Patient ID: Alexander Hubbard, male    DOB: August 16, 1955, 58 y.o.   MRN: [id number]  HPI Patient suffered skin tears to both shins to 4 days ago. He has a 20 mm x 20 mm circular skin tear on his right anterior shin and a 15 mm x 20 mm circular skin tear on his left anterior shin. There is no erythema. There is no warmth. There is no sign of infection. He has no fever. They do not hurt. Past Medical History  Diagnosis Date  . Diabetes mellitus   . Hypertension   . Obesity   . Sleep apnea   . Gouty arthritis    Current Outpatient Prescriptions on File Prior to Visit  Medication Sig Dispense Refill  . ammonium lactate (LAC-HYDRIN) 12 % lotion APPLY TO LEGS 2 TIMES DAILY  57 g  4  . aspirin 325 MG tablet Take 325 mg by mouth daily.      . BD ULTRA-FINE PEN NEEDLES 29G X 12.7MM MISC USE AS DIRECTED  100 each  11  . benzonatate (TESSALON) 200 MG capsule Take 1 capsule (200 mg total) by mouth every 8 (eight) hours as needed for cough.  30 capsule  0  . brinzolamide (AZOPT) 1 % ophthalmic suspension Place 1 drop into both eyes 2 (two) times daily.        . carvedilol (COREG) 12.5 MG tablet Take 12.5 mg by mouth 2 (two) times daily with a meal.      . carvedilol (COREG) 12.5 MG tablet TAKE 1 TABLET BY MOUTH TWICE A DAY  60 tablet  5  . clindamycin (CLEOCIN) 300 MG capsule Take 1 capsule (300 mg total) by mouth 3 (three) times daily.  30 capsule  1  . colchicine 0.6 MG tablet Take 0.6 mg by mouth daily.      . diphenhydrAMINE (SOMINEX) 25 MG tablet Take 25 mg by mouth daily. Takes 15 minutes before iron pill      . febuxostat (ULORIC) 40 MG tablet Take 40 mg by mouth daily.       . Ferrous Fumarate-Vitamin C 65-125 MG TABS Take 65 mg by mouth daily.  30 tablet  4  . fluticasone (FLONASE) 50 MCG/ACT nasal spray Place 2 sprays into both nostrils daily.  16 g  6  . furosemide (LASIX) 40 MG tablet Take 40 mg by mouth 2 (two) times daily.      Marland Kitchen. gabapentin (NEURONTIN) 300 MG capsule TAKE 2  CAPSULES BY MOUTH 3 TIMES A DAY  180 capsule  3  . glucose blood (ACCU-CHEK SMARTVIEW) test strip Use as instructed to check blood sugars 6 times per day dx code 250.62  200 each  5  . Insulin Glargine 100 UNIT/ML SOPN Inject 20 units in the morning and 40 units at night.      . insulin regular human CONCENTRATED (HUMULIN R) 500 UNIT/ML SOLN injection Inject 0.01-0.02 mLs (5-10 Units total) into the skin 3 (three) times daily with meals. Inject 8 units in AM, 6 units at Lunch, and 10 units at dinner.  1 vial  5  . Insulin Syringe-Needle U-100 (B-D INS SYR ULTRAFINE .3CC/30G) 30G X 1/2" 0.3 ML MISC USE AS DIRECTED 3 TIMES A DAY. DX CODE: 250.00  100 each  3  . latanoprost (XALATAN) 0.005 % ophthalmic solution Place 1 drop into the left eye at bedtime.       Marland Kitchen. lisinopril (PRINIVIL,ZESTRIL) 10 MG tablet TAKE 1 TABLET BY  MOUTH DAILY  30 tablet  2  . niacin (NIASPAN) 1000 MG CR tablet Take 1,000 mg by mouth at bedtime.      . pantoprazole (PROTONIX) 40 MG tablet TAKE 1 TABLET BY MOUTH TWICE A DAY  60 tablet  3  . prednisoLONE acetate (PRED FORTE) 1 % ophthalmic suspension Place 1 drop into the right eye at bedtime.       . predniSONE (DELTASONE) 10 MG tablet Take 2 tabs daily w/ food x 5 days, then 1 tab daily w/ food x 5 days then stop  15 tablet  0  . rosuvastatin (CRESTOR) 20 MG tablet TAKE 1 TABLET BY MOUTH DAILY AT BEDTIME  30 tablet  5  . silver sulfADIAZINE (SILVADENE) 1 % cream Apply 1 application topically daily.  50 g  0  . timolol (TIMOPTIC) 0.5 % ophthalmic solution Place 1 drop into both eyes 2 (two) times daily.      . vitamin B-12 (CYANOCOBALAMIN) 1000 MCG tablet Take 1,000 mcg by mouth daily.         Current Facility-Administered Medications on File Prior to Visit  Medication Dose Route Frequency Provider Last Rate Last Dose  . pneumococcal 13-valent conjugate vaccine (PREVNAR 13) injection 0.5 mL  0.5 mL Intramuscular Once Donita Brooks, MD       Allergies  Allergen Reactions  .  Penicillins Swelling and Rash   History   Social History  . Marital Status: Unknown    Spouse Name: N/A    Number of Children: N/A  . Years of Education: N/A   Occupational History  . Not on file.   Social History Main Topics  . Smoking status: Never Smoker   . Smokeless tobacco: Never Used  . Alcohol Use: No  . Drug Use: No  . Sexual Activity: Not on file   Other Topics Concern  . Not on file   Social History Narrative  . No narrative on file      Review of Systems  All other systems reviewed and are negative.       Objective:   Physical Exam  Vitals reviewed. Cardiovascular: Normal rate and regular rhythm.   Pulmonary/Chest: Effort normal and breath sounds normal.  Skin: Skin is warm. No erythema.   skin tears as described in the history of present illness.        Assessment & Plan:  1. Skin tear of left lower leg without complication  2. Skin tear of right lower leg without complication  There is no evidence of cellulitis. However given this patient's morbid obesity and diabetes he is at high risk for developing cellulitis. There cover the skin tears with Tegaderm. On recheck the patient on Friday. He is to return immediately if signs or symptoms of cellulitis develop.  Conside una boots if the skin tears do not heal.

## 2013-08-04 ENCOUNTER — Encounter (HOSPITAL_COMMUNITY)
Admission: EM | Disposition: A | Discharge status: Home / Self Care | Payer: Medicaid Other | Source: Home / Self Care | Attending: Internal Medicine

## 2013-08-04 ENCOUNTER — Encounter (HOSPITAL_COMMUNITY): Payer: Self-pay | Admitting: Emergency Medicine

## 2013-08-04 ENCOUNTER — Inpatient Hospital Stay (HOSPITAL_COMMUNITY)
Admission: EM | Admit: 2013-08-04 | Discharge: 2013-08-05 | DRG: 392 | Disposition: A | Discharge status: Home / Self Care | Payer: Medicaid Other | Attending: Internal Medicine | Admitting: Internal Medicine

## 2013-08-04 ENCOUNTER — Emergency Department (HOSPITAL_COMMUNITY): Payer: Medicaid Other

## 2013-08-04 DIAGNOSIS — I5032 Chronic diastolic (congestive) heart failure: Secondary | ICD-10-CM | POA: Diagnosis present

## 2013-08-04 DIAGNOSIS — E1149 Type 2 diabetes mellitus with other diabetic neurological complication: Secondary | ICD-10-CM

## 2013-08-04 DIAGNOSIS — N289 Disorder of kidney and ureter, unspecified: Secondary | ICD-10-CM

## 2013-08-04 DIAGNOSIS — Z79899 Other long term (current) drug therapy: Secondary | ICD-10-CM

## 2013-08-04 DIAGNOSIS — N183 Chronic kidney disease, stage 3 unspecified: Secondary | ICD-10-CM | POA: Diagnosis present

## 2013-08-04 DIAGNOSIS — R197 Diarrhea, unspecified: Secondary | ICD-10-CM

## 2013-08-04 DIAGNOSIS — Z9119 Patient's noncompliance with other medical treatment and regimen: Secondary | ICD-10-CM

## 2013-08-04 DIAGNOSIS — IMO0001 Reserved for inherently not codable concepts without codable children: Secondary | ICD-10-CM

## 2013-08-04 DIAGNOSIS — Z880 Allergy status to penicillin: Secondary | ICD-10-CM

## 2013-08-04 DIAGNOSIS — E119 Type 2 diabetes mellitus without complications: Secondary | ICD-10-CM | POA: Diagnosis present

## 2013-08-04 DIAGNOSIS — Z6841 Body Mass Index (BMI) 40.0 and over, adult: Secondary | ICD-10-CM

## 2013-08-04 DIAGNOSIS — Z833 Family history of diabetes mellitus: Secondary | ICD-10-CM

## 2013-08-04 DIAGNOSIS — G4733 Obstructive sleep apnea (adult) (pediatric): Secondary | ICD-10-CM | POA: Diagnosis present

## 2013-08-04 DIAGNOSIS — I129 Hypertensive chronic kidney disease with stage 1 through stage 4 chronic kidney disease, or unspecified chronic kidney disease: Secondary | ICD-10-CM | POA: Diagnosis present

## 2013-08-04 DIAGNOSIS — E869 Volume depletion, unspecified: Secondary | ICD-10-CM | POA: Diagnosis present

## 2013-08-04 DIAGNOSIS — Z91199 Patient's noncompliance with other medical treatment and regimen due to unspecified reason: Secondary | ICD-10-CM

## 2013-08-04 DIAGNOSIS — Z7982 Long term (current) use of aspirin: Secondary | ICD-10-CM

## 2013-08-04 DIAGNOSIS — Z794 Long term (current) use of insulin: Secondary | ICD-10-CM

## 2013-08-04 DIAGNOSIS — I951 Orthostatic hypotension: Secondary | ICD-10-CM | POA: Diagnosis present

## 2013-08-04 DIAGNOSIS — Z8249 Family history of ischemic heart disease and other diseases of the circulatory system: Secondary | ICD-10-CM

## 2013-08-04 DIAGNOSIS — I10 Essential (primary) hypertension: Secondary | ICD-10-CM | POA: Diagnosis present

## 2013-08-04 DIAGNOSIS — R55 Syncope and collapse: Secondary | ICD-10-CM | POA: Diagnosis present

## 2013-08-04 DIAGNOSIS — A088 Other specified intestinal infections: Principal | ICD-10-CM | POA: Diagnosis present

## 2013-08-04 DIAGNOSIS — I509 Heart failure, unspecified: Secondary | ICD-10-CM

## 2013-08-04 DIAGNOSIS — D649 Anemia, unspecified: Secondary | ICD-10-CM | POA: Diagnosis present

## 2013-08-04 LAB — COMPREHENSIVE METABOLIC PANEL
ALT: 11 U/L (ref 0–53)
AST: 15 U/L (ref 0–37)
Albumin: 3.5 g/dL (ref 3.5–5.2)
Alkaline Phosphatase: 41 U/L (ref 39–117)
BILIRUBIN TOTAL: 0.3 mg/dL (ref 0.3–1.2)
BUN: 87 mg/dL — ABNORMAL HIGH (ref 6–23)
CHLORIDE: 95 meq/L — AB (ref 96–112)
CO2: 20 meq/L (ref 19–32)
Calcium: 9.3 mg/dL (ref 8.4–10.5)
Creatinine, Ser: 2.63 mg/dL — ABNORMAL HIGH (ref 0.50–1.35)
GFR calc Af Amer: 29 mL/min — ABNORMAL LOW (ref >90)
GFR, EST NON AFRICAN AMERICAN: 25 mL/min — AB (ref >90)
Glucose, Bld: 65 mg/dL — ABNORMAL LOW (ref 70–99)
Potassium: 4.8 mEq/L (ref 3.7–5.3)
Sodium: 134 mEq/L — ABNORMAL LOW (ref 137–147)
Total Protein: 6.9 g/dL (ref 6.0–8.3)

## 2013-08-04 LAB — CBC WITH DIFFERENTIAL/PLATELET
Basophils Absolute: 0 10*3/uL (ref 0.0–0.1)
Basophils Relative: 0 % (ref 0–1)
Eosinophils Absolute: 0.5 10*3/uL (ref 0.0–0.7)
Eosinophils Relative: 7 % — ABNORMAL HIGH (ref 0–5)
HEMATOCRIT: 32.9 % — AB (ref 39.0–52.0)
Hemoglobin: 11.1 g/dL — ABNORMAL LOW (ref 13.0–17.0)
LYMPHS ABS: 1.9 10*3/uL (ref 0.7–4.0)
Lymphocytes Relative: 26 % (ref 12–46)
MCH: 27.3 pg (ref 26.0–34.0)
MCHC: 33.7 g/dL (ref 30.0–36.0)
MCV: 80.8 fL (ref 78.0–100.0)
MONOS PCT: 5 % (ref 3–12)
Monocytes Absolute: 0.4 10*3/uL (ref 0.1–1.0)
NEUTROS ABS: 4.5 10*3/uL (ref 1.7–7.7)
Neutrophils Relative %: 62 % (ref 43–77)
PLATELETS: 190 10*3/uL (ref 150–400)
RBC: 4.07 MIL/uL — AB (ref 4.22–5.81)
RDW: 14.4 % (ref 11.5–15.5)
WBC: 7.3 10*3/uL (ref 4.0–10.5)

## 2013-08-04 LAB — GLUCOSE, CAPILLARY
GLUCOSE-CAPILLARY: 133 mg/dL — AB (ref 70–99)
Glucose-Capillary: 194 mg/dL — ABNORMAL HIGH (ref 70–99)
Glucose-Capillary: 209 mg/dL — ABNORMAL HIGH (ref 70–99)

## 2013-08-04 LAB — TROPONIN I
Troponin I: <0.3 ng/mL (ref <0.30)
Troponin I: <0.3 ng/mL (ref <0.30)
Troponin I: <0.3 ng/mL (ref <0.30)
Troponin I: <0.3 ng/mL (ref <0.30)

## 2013-08-04 SURGERY — LEFT HEART CATHETERIZATION WITH CORONARY ANGIOGRAM
Anesthesia: LOCAL

## 2013-08-04 MED ORDER — TIMOLOL MALEATE 0.5 % OP SOLN
1.0000 [drp] | Freq: Two times a day (BID) | OPHTHALMIC | Status: DC
  Administered 2013-08-04 – 2013-08-05 (×2): 1 [drp] via OPHTHALMIC
  Filled 2013-08-04: qty 5

## 2013-08-04 MED ORDER — ASPIRIN 325 MG PO TABS
325.0000 mg | ORAL_TABLET | Freq: Every day | ORAL | Status: DC
  Administered 2013-08-05: 325 mg via ORAL
  Filled 2013-08-04 (×2): qty 1

## 2013-08-04 MED ORDER — INSULIN GLARGINE 100 UNIT/ML SOLOSTAR PEN: 15.0000 [IU] | PEN_INJECTOR | Freq: Every day | SUBCUTANEOUS | Status: DC

## 2013-08-04 MED ORDER — FEBUXOSTAT 40 MG PO TABS
40.0000 mg | ORAL_TABLET | Freq: Every day | ORAL | Status: DC
  Administered 2013-08-04 – 2013-08-05 (×2): 40 mg via ORAL
  Filled 2013-08-04 (×2): qty 1

## 2013-08-04 MED ORDER — LATANOPROST 0.005 % OP SOLN
1.0000 [drp] | Freq: Every day | OPHTHALMIC | Status: DC
  Administered 2013-08-05: 1 [drp] via OPHTHALMIC
  Filled 2013-08-04: qty 2.5

## 2013-08-04 MED ORDER — NIACIN ER 500 MG PO CPCR
1000.0000 mg | ORAL_CAPSULE | Freq: Every day | ORAL | Status: DC
  Administered 2013-08-04: 1000 mg via ORAL
  Filled 2013-08-04 (×2): qty 2

## 2013-08-04 MED ORDER — SILVER SULFADIAZINE 1 % EX CREA
1.0000 "application " | TOPICAL_CREAM | Freq: Every day | CUTANEOUS | Status: DC
  Administered 2013-08-05: 1 via TOPICAL
  Filled 2013-08-04: qty 85

## 2013-08-04 MED ORDER — COLCHICINE 0.6 MG PO TABS
0.6000 mg | ORAL_TABLET | Freq: Every day | ORAL | Status: DC
  Administered 2013-08-04 – 2013-08-05 (×2): 0.6 mg via ORAL
  Filled 2013-08-04 (×2): qty 1

## 2013-08-04 MED ORDER — ONDANSETRON HCL 4 MG PO TABS: 4.0000 mg | ORAL_TABLET | Freq: Four times a day (QID) | ORAL | Status: DC | PRN

## 2013-08-04 MED ORDER — SODIUM CHLORIDE 0.9 % IJ SOLN
3.0000 mL | Freq: Two times a day (BID) | INTRAMUSCULAR | Status: DC
  Administered 2013-08-04 – 2013-08-05 (×2): 3 mL via INTRAVENOUS

## 2013-08-04 MED ORDER — ATORVASTATIN CALCIUM 40 MG PO TABS
40.0000 mg | ORAL_TABLET | Freq: Every day | ORAL | Status: DC
Start: 2013-08-04 — End: 2013-08-05
  Administered 2013-08-04: 40 mg via ORAL
  Filled 2013-08-04 (×2): qty 1

## 2013-08-04 MED ORDER — INSULIN ASPART 100 UNIT/ML ~~LOC~~ SOLN
0.0000 [IU] | Freq: Three times a day (TID) | SUBCUTANEOUS | Status: DC
  Administered 2013-08-04 – 2013-08-05 (×3): 2 [IU] via SUBCUTANEOUS

## 2013-08-04 MED ORDER — INSULIN GLARGINE 100 UNIT/ML ~~LOC~~ SOLN
15.0000 [IU] | Freq: Every day | SUBCUTANEOUS | Status: DC
  Administered 2013-08-04: 15 [IU] via SUBCUTANEOUS
  Filled 2013-08-04 (×2): qty 0.15

## 2013-08-04 MED ORDER — ASPIRIN 81 MG PO CHEW: 324.0000 mg | CHEWABLE_TABLET | Freq: Once | ORAL | Status: DC

## 2013-08-04 MED ORDER — DEXTROSE 50 % IV SOLN
50.0000 mL | Freq: Once | INTRAVENOUS | Status: AC
  Administered 2013-08-04: 50 mL via INTRAVENOUS
  Filled 2013-08-04: qty 50

## 2013-08-04 MED ORDER — SODIUM CHLORIDE 0.9 % IV SOLN: INTRAVENOUS | Status: DC

## 2013-08-04 MED ORDER — ACETAMINOPHEN 650 MG RE SUPP: 650.0000 mg | Freq: Four times a day (QID) | RECTAL | Status: DC | PRN

## 2013-08-04 MED ORDER — FLUTICASONE PROPIONATE 50 MCG/ACT NA SUSP
2.0000 | Freq: Every day | NASAL | Status: DC
  Administered 2013-08-05: 2 via NASAL
  Filled 2013-08-04: qty 16

## 2013-08-04 MED ORDER — PANTOPRAZOLE SODIUM 40 MG PO TBEC
40.0000 mg | DELAYED_RELEASE_TABLET | Freq: Every day | ORAL | Status: DC
  Administered 2013-08-04 – 2013-08-05 (×2): 40 mg via ORAL

## 2013-08-04 MED ORDER — ONDANSETRON HCL 4 MG/2ML IJ SOLN: 4.0000 mg | Freq: Four times a day (QID) | INTRAMUSCULAR | Status: DC | PRN

## 2013-08-04 MED ORDER — BRINZOLAMIDE 1 % OP SUSP
1.0000 [drp] | Freq: Two times a day (BID) | OPHTHALMIC | Status: DC
  Administered 2013-08-04 – 2013-08-05 (×2): 1 [drp] via OPHTHALMIC
  Filled 2013-08-04: qty 10

## 2013-08-04 MED ORDER — SODIUM CHLORIDE 0.9 % IV BOLUS (SEPSIS)
500.0000 mL | Freq: Once | INTRAVENOUS | Status: AC
  Administered 2013-08-04: 500 mL via INTRAVENOUS

## 2013-08-04 MED ORDER — SODIUM CHLORIDE 0.9 % IV SOLN
INTRAVENOUS | Status: DC
  Administered 2013-08-04 (×2): via INTRAVENOUS

## 2013-08-04 MED ORDER — NIACIN ER (ANTIHYPERLIPIDEMIC) 1000 MG PO TBCR: 1000.0000 mg | EXTENDED_RELEASE_TABLET | Freq: Every day | ORAL | Status: DC

## 2013-08-04 MED ORDER — CARVEDILOL 12.5 MG PO TABS
12.5000 mg | ORAL_TABLET | Freq: Two times a day (BID) | ORAL | Status: DC
  Administered 2013-08-04 – 2013-08-05 (×3): 12.5 mg via ORAL
  Filled 2013-08-04 (×5): qty 1

## 2013-08-04 MED ORDER — PREDNISOLONE ACETATE 1 % OP SUSP
1.0000 [drp] | Freq: Every day | OPHTHALMIC | Status: DC
  Administered 2013-08-04: 1 [drp] via OPHTHALMIC
  Filled 2013-08-04: qty 1

## 2013-08-04 MED ORDER — ACETAMINOPHEN 325 MG PO TABS: 650.0000 mg | ORAL_TABLET | Freq: Four times a day (QID) | ORAL | Status: DC | PRN

## 2013-08-04 MED ORDER — HEPARIN SODIUM (PORCINE) 5000 UNIT/ML IJ SOLN
5000.0000 [IU] | Freq: Three times a day (TID) | INTRAMUSCULAR | Status: DC
  Administered 2013-08-04 – 2013-08-05 (×4): 5000 [IU] via SUBCUTANEOUS
  Filled 2013-08-04 (×6): qty 1

## 2013-08-04 MED ORDER — GABAPENTIN 300 MG PO CAPS
300.0000 mg | ORAL_CAPSULE | Freq: Two times a day (BID) | ORAL | Status: DC
  Administered 2013-08-04 – 2013-08-05 (×3): 300 mg via ORAL
  Filled 2013-08-04 (×4): qty 1

## 2013-08-04 NOTE — ED Notes (Signed)
Cardiology, MD at bedside. 

## 2013-08-04 NOTE — Progress Notes (Signed)
Brief Cardiology Note Patient presented as a Code STEMI initiated by the ambulance crew. Briefly, 58 yo with multiple medical problems including diabetes, hyperlipidemia, sleep apnea, morbid obesity, CKD presenting after syncopal episode in the setting of 1 day of diarrhea. Witnessed by wife, he became unresponsive for several minutes while on the toilet. He claims no loss of consciousness and was just sleepy. Nonetheless, no chest pain, shortness of breath or nausea currently. EKGs from ambulance and from ER reviewed. Sinus with RBBB, essentially unchanged from prior EKGs.  In the absence of symptoms or EKG changes, Code STEMI canceled. At this time, working diagnosis is syncope from orthostasis. Do not think this is a primary cardiac problem. Please consult if cardiology maybe of further service.

## 2013-08-04 NOTE — H&P (Signed)
[facility] History and Physical  Alexander Hubbard ZOX:[id number] DOB: 1955/11/27 DOA: 08/04/2013  Referring physician: Dr Ranae Palms PCP: Leo Grosser, MD   Chief Complaint: Loss of consciousness.   HPI: Alexander Hubbard is a 58 y.o. male with PMH significant for Hypertension, Diabetes, CKD, Diastolic Heart Failure who presents after a syncope episode. Patient relates that he started to have diarrhea the day prior to admission. He relates 6 to 7 watery stool episodes. He denies fever, abdominal pain. He does relates some nausea. He went to the bathroom overnight, sat on the toilet, next thing he remember his wife was slapping at him and calling his name. He was told that he was not responsive.  He denies Chest pain, dyspnea. Last BM was yesterday around 5 PM. He was on course of antibiotics on January.  Patient was brought initially as a Code STEMI. This was subsequently cancel by Cardiologist.    Review of Systems:  Negative, except as per HPI.   Past Medical History  Diagnosis Date  . Diabetes mellitus   . Hypertension   . Obesity   . Sleep apnea   . Gouty arthritis    Past Surgical History  Procedure Laterality Date  . Eye surgery    . Knee surgery     Social History:  reports that he has never smoked. He has never used smokeless tobacco. He reports that he does not drink alcohol or use illicit drugs.  Allergies  Allergen Reactions  . Penicillins Swelling and Rash    Family History  Problem Relation Age of Onset  . Hypertension Sister   . Diabetes Mellitus II Sister      Prior to Admission medications   Medication Sig Start Date End Date Taking? Authorizing Provider  ammonium lactate (LAC-HYDRIN) 12 % lotion APPLY TO LEGS 2 TIMES DAILY 04/04/13   Marlowe Aschoff, DPM  aspirin 325 MG tablet Take 325 mg by mouth daily.    Historical Provider, MD  BD ULTRA-FINE PEN NEEDLES 29G X 12.7MM MISC USE AS DIRECTED    Donita Brooks, MD  benzonatate (TESSALON) 200 MG  capsule Take 1 capsule (200 mg total) by mouth every 8 (eight) hours as needed for cough. 06/07/13   Donita Brooks, MD  brinzolamide (AZOPT) 1 % ophthalmic suspension Place 1 drop into both eyes 2 (two) times daily.      Historical Provider, MD  carvedilol (COREG) 12.5 MG tablet Take 12.5 mg by mouth 2 (two) times daily with a meal.    Historical Provider, MD  carvedilol (COREG) 12.5 MG tablet TAKE 1 TABLET BY MOUTH TWICE A DAY    Donita Brooks, MD  clindamycin (CLEOCIN) 300 MG capsule Take 1 capsule (300 mg total) by mouth 3 (three) times daily. 06/01/13   Marlowe Aschoff, DPM  colchicine 0.6 MG tablet Take 0.6 mg by mouth daily.    Historical Provider, MD  diphenhydrAMINE (SOMINEX) 25 MG tablet Take 25 mg by mouth daily. Takes 15 minutes before iron pill    Historical Provider, MD  febuxostat (ULORIC) 40 MG tablet Take 40 mg by mouth daily.     Historical Provider, MD  Ferrous Fumarate-Vitamin C 65-125 MG TABS Take 65 mg by mouth daily. 05/12/13   Reather Littler, MD  fluticasone (FLONASE) 50 MCG/ACT nasal spray Place 2 sprays into both nostrils daily. 06/03/13   Donita Brooks, MD  furosemide (LASIX) 40 MG tablet Take 40 mg by mouth 2 (two) times daily.    Historical Provider, MD  gabapentin (NEURONTIN) 300 MG capsule TAKE 2 CAPSULES BY MOUTH 3 TIMES A DAY 07/06/13   Donita BrooksWarren T Pickard, MD  glucose blood (ACCU-CHEK SMARTVIEW) test strip Use as instructed to check blood sugars 6 times per day dx code 250.62 06/06/13   Reather LittlerAjay Kumar, MD  Insulin Glargine 100 UNIT/ML SOPN Inject 20 units in the morning and 40 units at night. 04/07/13   Reather LittlerAjay Kumar, MD  insulin regular human CONCENTRATED (HUMULIN R) 500 UNIT/ML SOLN injection Inject 0.01-0.02 mLs (5-10 Units total) into the skin 3 (three) times daily with meals. Inject 8 units in AM, 6 units at Lunch, and 10 units at dinner. 04/07/13   Reather LittlerAjay Kumar, MD  Insulin Syringe-Needle U-100 (B-D INS SYR ULTRAFINE .3CC/30G) 30G X 1/2" 0.3 ML MISC USE AS DIRECTED 3 TIMES A DAY.  DX CODE: 250.00 07/06/13   Reather LittlerAjay Kumar, MD  latanoprost (XALATAN) 0.005 % ophthalmic solution Place 1 drop into the left eye at bedtime.     Historical Provider, MD  lisinopril (PRINIVIL,ZESTRIL) 10 MG tablet TAKE 1 TABLET BY MOUTH DAILY    Donita BrooksWarren T Pickard, MD  niacin (NIASPAN) 1000 MG CR tablet Take 1,000 mg by mouth at bedtime.    Historical Provider, MD  pantoprazole (PROTONIX) 40 MG tablet TAKE 1 TABLET BY MOUTH TWICE A DAY 07/06/13   Donita BrooksWarren T Pickard, MD  prednisoLONE acetate (PRED FORTE) 1 % ophthalmic suspension Place 1 drop into the right eye at bedtime.     Historical Provider, MD  predniSONE (DELTASONE) 10 MG tablet Take 2 tabs daily w/ food x 5 days, then 1 tab daily w/ food x 5 days then stop 06/22/13   Oretha Milchakesh V Alva, MD  rosuvastatin (CRESTOR) 20 MG tablet TAKE 1 TABLET BY MOUTH DAILY AT BEDTIME 07/06/13   Donita BrooksWarren T Pickard, MD  silver sulfADIAZINE (SILVADENE) 1 % cream Apply 1 application topically daily. 06/01/13   Marlowe AschoffKathryn Egerton, DPM  timolol (TIMOPTIC) 0.5 % ophthalmic solution Place 1 drop into both eyes 2 (two) times daily.    Historical Provider, MD  vitamin B-12 (CYANOCOBALAMIN) 1000 MCG tablet Take 1,000 mcg by mouth daily.      Historical Provider, MD   Physical Exam: Filed Vitals:   08/04/13 0742  BP: 136/59  Pulse: 82  Temp: 97.9 F (36.6 C)  Resp: 22    BP 136/59  Pulse 82  Temp(Src) 97.9 F (36.6 C) (Oral)  Resp 22  Ht 5\' 10"  (1.778 m)  Wt 171.46 kg (378 lb)  BMI 54.24 kg/m2  SpO2 97%  General:  Appears calm and comfortable Eyes: PERRL, normal lids, irises & conjunctiva ENT: grossly normal hearing, lips & tongue Neck: no LAD, masses or thyromegaly Cardiovascular: RRR, no m/r/g. No LE edema. Telemetry: SR, no arrhythmias  Respiratory: CTA bilaterally, no w/r/r. Normal respiratory effort. Abdomen: soft, ntnd Skin: bilateral LE skin tear.  Musculoskeletal: grossly normal tone BUE/BLE Psychiatric: grossly normal mood and affect, speech fluent and  appropriate Neurologic: grossly non-focal.          Labs on Admission:  Basic Metabolic Panel:  Recent Labs Lab 08/04/13 0502  NA 134*  K 4.8  CL 95*  CO2 20  GLUCOSE 65*  BUN 87*  CREATININE 2.63*  CALCIUM 9.3   Liver Function Tests:  Recent Labs Lab 08/04/13 0502  AST 15  ALT 11  ALKPHOS 41  BILITOT 0.3  PROT 6.9  ALBUMIN 3.5   No results found for this basename: LIPASE, AMYLASE,  in the last 168  hours No results found for this basename: AMMONIA,  in the last 168 hours CBC:  Recent Labs Lab 08/04/13 0502  WBC 7.3  NEUTROABS 4.5  HGB 11.1*  HCT 32.9*  MCV 80.8  PLT 190   Cardiac Enzymes:  Recent Labs Lab 08/04/13 0502  TROPONINI <0.30    BNP (last 3 results)  Recent Labs  09/22/12 0927  PROBNP 225.2*   CBG: No results found for this basename: GLUCAP,  in the last 168 hours  Radiological Exams on Admission: Dg Chest Port 1 View  08/04/2013   CLINICAL DATA:  Syncope  EXAM: PORTABLE CHEST - 1 VIEW  COMPARISON:  Prior radiograph from 09/21/2012  FINDINGS: Cardiomegaly is stable as compared to prior study.  The lungs are normally inflated. No airspace consolidation, pleural effusion, or pulmonary edema is identified. There is no pneumothorax.  No acute osseous abnormality identified.  IMPRESSION: Stable cardiomegaly without evidence of pulmonary edema or other acute abnormality.   Electronically Signed   By: Rise Mu M.D.   On: 08/04/2013 06:25    EKG: Independently reviewed. Old RBBB.   Assessment/Plan Active Problems:   Diastolic CHF, chronic   HTN (hypertension)   Morbid obesity   Type II or unspecified type diabetes mellitus with neurological manifestations, uncontrolled   Diarrhea   Chronic kidney disease, stage III (moderate)   Syncope  1-Syncope; probably orthostatic, decrease volume from diarrhea. IV fluids. Hold diuretics. Cycle troponin. Check orthostatics. Patient ;s blood sugar was at 65. He received amp D 50. Patient  BP on admission in the 80 range.   2-Diarrhea; probably gastroenteritis. He is at risk for C diff infection. If diarrhea reoccurs will check for C diff.  3-Diabetes; SSI, decrease lantus to 15 units.  4-CKD; Cr range from 2.0 to 2.4. Hold diuretic in setting of syncope. Repeat in am.  5-Diastolic Dysfunction; hold diuretic. Monitor for pulmonary edema.   Code Status: presume full code.  Family Communication: Care discussed with patient.  Disposition Plan: expect less than 2 days.   Time spent: 75 minutes.   [facility] Pager [phone]

## 2013-08-04 NOTE — ED Notes (Signed)
Patient unable to void at this time

## 2013-08-04 NOTE — ED Notes (Addendum)
EMS reports that the pt was woken from sleep about an hour ago with nausea and chest pain, no vomiting. While pt was attempting to have a BM, he had a syncopal episode. Pt with nausea and vomiting yesterday. Pt received 325 mg of Aspirin before EMS arrival. Pt was due to have a gastric bypass on the third, but it was cancelled due to renal impairment. Pt received 1 NTG en route.

## 2013-08-04 NOTE — ED Provider Notes (Signed)
CSN: [id number]     Arrival date & time 08/04/13  0453 History   First MD Initiated Contact with Patient 08/04/13 [phone]     Chief Complaint  Patient presents with  . Code STEMI     (Consider location/radiation/quality/duration/timing/severity/associated sxs/prior Treatment) HPI Patient presents as a code STEMI. He states that several days of diarrhea and woke up this evening with nausea. He went to the bathroom sat on the toilet. At this point he had a syncopal episode. His wife to the floor. He denies any trauma. Most consciousness was brief. EMS was called patient was found to be pale and diaphoretic. EKG was performed with questionable ST segment elevation in the inferior leads. Patient at no time had any chest pain. He continues to deny chest pain at this point. He states he is feeling much better now. Past Medical History  Diagnosis Date  . Diabetes mellitus   . Hypertension   . Obesity   . Sleep apnea   . Gouty arthritis    Past Surgical History  Procedure Laterality Date  . Eye surgery    . Knee surgery     Family History  Problem Relation Age of Onset  . Hypertension Sister   . Diabetes Mellitus II Sister    History  Substance Use Topics  . Smoking status: Never Smoker   . Smokeless tobacco: Never Used  . Alcohol Use: No    Review of Systems  Constitutional: Positive for diaphoresis. Negative for fever and chills.  Respiratory: Negative for shortness of breath.   Cardiovascular: Negative for chest pain, palpitations and leg swelling.  Gastrointestinal: Positive for nausea and diarrhea. Negative for vomiting, abdominal pain and constipation.  Musculoskeletal: Negative for back pain, myalgias, neck pain and neck stiffness.  Skin: Negative for rash and wound.  Neurological: Positive for dizziness, syncope and light-headedness. Negative for weakness, numbness and headaches.  All other systems reviewed and are negative.      Allergies  Penicillins  Home  Medications   Current Outpatient Rx  Name  Route  Sig  Dispense  Refill  . ammonium lactate (LAC-HYDRIN) 12 % lotion      APPLY TO LEGS 2 TIMES DAILY   57 g   4   . aspirin 325 MG tablet   Oral   Take 325 mg by mouth daily.         . BD ULTRA-FINE PEN NEEDLES 29G X 12.7MM MISC      USE AS DIRECTED   100 each   11   . benzonatate (TESSALON) 200 MG capsule   Oral   Take 1 capsule (200 mg total) by mouth every 8 (eight) hours as needed for cough.   30 capsule   0   . brinzolamide (AZOPT) 1 % ophthalmic suspension   Both Eyes   Place 1 drop into both eyes 2 (two) times daily.           . carvedilol (COREG) 12.5 MG tablet   Oral   Take 12.5 mg by mouth 2 (two) times daily with a meal.         . carvedilol (COREG) 12.5 MG tablet      TAKE 1 TABLET BY MOUTH TWICE A DAY   60 tablet   5   . clindamycin (CLEOCIN) 300 MG capsule   Oral   Take 1 capsule (300 mg total) by mouth 3 (three) times daily.   30 capsule   1   . colchicine 0.6 MG  tablet   Oral   Take 0.6 mg by mouth daily.         . diphenhydrAMINE (SOMINEX) 25 MG tablet   Oral   Take 25 mg by mouth daily. Takes 15 minutes before iron pill         . febuxostat (ULORIC) 40 MG tablet   Oral   Take 40 mg by mouth daily.          . Ferrous Fumarate-Vitamin C 65-125 MG TABS   Oral   Take 65 mg by mouth daily.   30 tablet   4   . fluticasone (FLONASE) 50 MCG/ACT nasal spray   Each Nare   Place 2 sprays into both nostrils daily.   16 g   6   . furosemide (LASIX) 40 MG tablet   Oral   Take 40 mg by mouth 2 (two) times daily.         Marland Kitchen. gabapentin (NEURONTIN) 300 MG capsule      TAKE 2 CAPSULES BY MOUTH 3 TIMES A DAY   180 capsule   3   . glucose blood (ACCU-CHEK SMARTVIEW) test strip      Use as instructed to check blood sugars 6 times per day dx code 250.62   200 each   5   . Insulin Glargine 100 UNIT/ML SOPN      Inject 20 units in the morning and 40 units at night.          . insulin regular human CONCENTRATED (HUMULIN R) 500 UNIT/ML SOLN injection   Subcutaneous   Inject 0.01-0.02 mLs (5-10 Units total) into the skin 3 (three) times daily with meals. Inject 8 units in AM, 6 units at Lunch, and 10 units at dinner.   1 vial   5   . Insulin Syringe-Needle U-100 (B-D INS SYR ULTRAFINE .3CC/30G) 30G X 1/2" 0.3 ML MISC      USE AS DIRECTED 3 TIMES A DAY. DX CODE: 250.00   100 each   3   . latanoprost (XALATAN) 0.005 % ophthalmic solution   Left Eye   Place 1 drop into the left eye at bedtime.          Marland Kitchen. lisinopril (PRINIVIL,ZESTRIL) 10 MG tablet      TAKE 1 TABLET BY MOUTH DAILY   30 tablet   2   . niacin (NIASPAN) 1000 MG CR tablet   Oral   Take 1,000 mg by mouth at bedtime.         . pantoprazole (PROTONIX) 40 MG tablet      TAKE 1 TABLET BY MOUTH TWICE A DAY   60 tablet   3   . prednisoLONE acetate (PRED FORTE) 1 % ophthalmic suspension   Right Eye   Place 1 drop into the right eye at bedtime.          . predniSONE (DELTASONE) 10 MG tablet      Take 2 tabs daily w/ food x 5 days, then 1 tab daily w/ food x 5 days then stop   15 tablet   0   . rosuvastatin (CRESTOR) 20 MG tablet      TAKE 1 TABLET BY MOUTH DAILY AT BEDTIME   30 tablet   5   . silver sulfADIAZINE (SILVADENE) 1 % cream   Topical   Apply 1 application topically daily.   50 g   0   . timolol (TIMOPTIC) 0.5 % ophthalmic solution   Both Eyes  Place 1 drop into both eyes 2 (two) times daily.         . vitamin B-12 (CYANOCOBALAMIN) 1000 MCG tablet   Oral   Take 1,000 mcg by mouth daily.            BP 123/59  Pulse 77  Temp(Src) 98.3 F (36.8 C) (Oral)  Resp 15  Ht 5\' 10"  (1.778 m)  Wt 378 lb (171.46 kg)  BMI 54.24 kg/m2  SpO2 93% Physical Exam  Nursing note and vitals reviewed. Constitutional: He is oriented to person, place, and time. He appears well-developed and well-nourished. No distress.  Obese  HENT:  Head: Normocephalic and  atraumatic.  Mouth/Throat: Oropharynx is clear and moist.  Eyes: EOM are normal. Pupils are equal, round, and reactive to light.  Neck: Normal range of motion. Neck supple.  No posterior midline cervical tenderness to palpation.  Cardiovascular: Normal rate and regular rhythm.   Pulmonary/Chest: Effort normal and breath sounds normal. No respiratory distress. He has no wheezes. He has no rales. He exhibits no tenderness.  Abdominal: Soft. Bowel sounds are normal. He exhibits no distension and no mass. There is no tenderness. There is no rebound and no guarding.  Musculoskeletal: Normal range of motion. He exhibits no edema and no tenderness.  Neurological: He is alert and oriented to person, place, and time.  Patient is alert and oriented x3 with clear, goal oriented speech. Patient has 5/5 motor in all extremities. Sensation is intact to light touch.   Skin: Skin is warm and dry. No rash noted. No erythema.  Psychiatric: He has a normal mood and affect. His behavior is normal.    ED Course  Procedures (including critical care time) Labs Review Labs Reviewed  CBC WITH DIFFERENTIAL - Abnormal; Notable for the following:    RBC 4.07 (*)    Hemoglobin 11.1 (*)    HCT 32.9 (*)    Eosinophils Relative 7 (*)    All other components within normal limits  COMPREHENSIVE METABOLIC PANEL - Abnormal; Notable for the following:    Sodium 134 (*)    Chloride 95 (*)    Glucose, Bld 65 (*)    BUN 87 (*)    Creatinine, Ser 2.63 (*)    GFR calc non Af Amer 25 (*)    GFR calc Af Amer 29 (*)    All other components within normal limits  TROPONIN I  URINALYSIS, ROUTINE W REFLEX MICROSCOPIC   Imaging Review Dg Chest Port 1 View  08/04/2013   CLINICAL DATA:  Syncope  EXAM: PORTABLE CHEST - 1 VIEW  COMPARISON:  Prior radiograph from 09/21/2012  FINDINGS: Cardiomegaly is stable as compared to prior study.  The lungs are normally inflated. No airspace consolidation, pleural effusion, or pulmonary edema  is identified. There is no pneumothorax.  No acute osseous abnormality identified.  IMPRESSION: Stable cardiomegaly without evidence of pulmonary edema or other acute abnormality.   Electronically Signed   By: Rise Mu M.D.   On: 08/04/2013 06:25     EKG Interpretation   Date/Time:  Thursday August 04 2013 04:58:28 EST Ventricular Rate:  80 PR Interval:    QRS Duration: 154 QT Interval:  379 QTC Calculation: 437 R Axis:   -27 Text Interpretation:  Age not entered, assumed to be  58 years old for  purpose of ECG interpretation Accelerated junctional rhythm Right bundle  branch block Inferior infarct, old Lateral leads are also involved  Confirmed by Ranae Palms  MD, Alphonsine Minium (  [id number]) on 08/04/2013 6:46:42 AM      MDM   Final diagnoses:  None    Discussed with Dr. Excell Seltzer. EKG with right bundle branch block but is essentially unchanged from previous. This combined with lack of chest pain, STEMI cancelled. Will discuss with medicine admission for syncope.  Is well-appearing in emergency department. His blood and low blood pressure improved. Discussed with [facility] will admit the patient.  Loren Racer, MD 08/04/13 [phone]

## 2013-08-04 NOTE — ED Notes (Signed)
Saline bolus started due to low BP.

## 2013-08-04 NOTE — ED Notes (Signed)
Pt denies having chest pain at all this evening.

## 2013-08-04 NOTE — ED Notes (Signed)
Admitting md at bedside

## 2013-08-05 ENCOUNTER — Ambulatory Visit: Payer: Medicaid Other | Admitting: Family Medicine

## 2013-08-05 DIAGNOSIS — R55 Syncope and collapse: Secondary | ICD-10-CM

## 2013-08-05 DIAGNOSIS — E119 Type 2 diabetes mellitus without complications: Secondary | ICD-10-CM

## 2013-08-05 DIAGNOSIS — Z794 Long term (current) use of insulin: Secondary | ICD-10-CM

## 2013-08-05 LAB — BASIC METABOLIC PANEL
BUN: 79 mg/dL — ABNORMAL HIGH (ref 6–23)
CHLORIDE: 102 meq/L (ref 96–112)
CO2: 20 meq/L (ref 19–32)
CREATININE: 2.18 mg/dL — AB (ref 0.50–1.35)
Calcium: 8.5 mg/dL (ref 8.4–10.5)
GFR calc non Af Amer: 32 mL/min — ABNORMAL LOW (ref >90)
GFR, EST AFRICAN AMERICAN: 37 mL/min — AB (ref >90)
Glucose, Bld: 187 mg/dL — ABNORMAL HIGH (ref 70–99)
POTASSIUM: 4.6 meq/L (ref 3.7–5.3)
Sodium: 137 mEq/L (ref 137–147)

## 2013-08-05 LAB — GLUCOSE, CAPILLARY
GLUCOSE-CAPILLARY: 161 mg/dL — AB (ref 70–99)
Glucose-Capillary: 155 mg/dL — ABNORMAL HIGH (ref 70–99)
Glucose-Capillary: 180 mg/dL — ABNORMAL HIGH (ref 70–99)

## 2013-08-05 NOTE — Progress Notes (Signed)
Pt discharged home.  Iv d/c.  Alert and oriented x4.  No c/o pain or shob.  Education given on wound care, meds, diet, activity, and follow-up care and appts.  Pt verbalized understanding.

## 2013-08-05 NOTE — Progress Notes (Signed)
Inpatient Diabetes Program Recommendations  AACE/ADA: New Consensus Statement on Inpatient Glycemic Control (2013)  Target Ranges:  Prepandial:   less than 140 mg/dL      Peak postprandial:   less than 180 mg/dL (1-2 hours)      Critically ill patients:  140 - 180 mg/dL   Reason for Visit: Elevated CBG's  Diabetes history: Type 2 diabetes Outpatient Diabetes medications: Note patient on U500 insulin 8 units with breakfast, 6 units at lunch, and 10 units at dinner Current orders for Inpatient glycemic control: Lantus 15 units daily, and Novolog moderate tid with meals.  CBG's look okay.  Should be able to resume U500 at discharge.    Thanks,  Beryl MeagerJenny Jenniffer Vessels, RN, BC-ADM Inpatient Diabetes Coordinator Pager [phone]

## 2013-08-05 NOTE — Progress Notes (Signed)
Echocardiogram 2D Echocardiogram has been performed.  08/05/2013 10:33 AM Gertie FeyMichelle Ameen Mostafa, RVT, RDCS, RDMS

## 2013-08-05 NOTE — Progress Notes (Signed)
VASCULAR LAB PRELIMINARY  PRELIMINARY  PRELIMINARY  PRELIMINARY  Carotid duplex  completed.    Preliminary report:  Technically difficult and limited exam due to body habitus and depth of vessels.  The CCAs, proximal ICAs and vertebral arteries are patent.  Unable to evaluate/visualize much more.  Hetal Proano, RVT 08/05/2013, 2:24 PM

## 2013-08-05 NOTE — Consult Note (Addendum)
WOC wound consult note Reason for Consult: Consult requested for bilat leg wounds.  Pt [facility] and [facility] applied dressing and ordered Silvadene.  Currently, BLE are covered with Tegaderm clear adhesive film with mod amt tan drainage underneath.  Wound type: partial thickness abrasions to bilat calves Measurement:1.5X1.5X.1cm to right leg and 1.5X1X.1cm to left leg Wound bed: mod amt tan drainage removes easily, revealing pink moist wound bed to each site, no odor Periwound: intact skin surrounding Dressing procedure/placement/frequency: Foam dressing to absorb drainage and promote healing.  Pt and wife appear to be well informed when discussing topical treatment. He previously was using Silvadene but wounds are pink and this treatment is no longer indicated. Please re-consult if further assistance is needed.  Thank-you,  Cammie Mcgeeawn Carnesha Maravilla MSN, RN, CWOCN, [place]AP, CNS [phone]

## 2013-08-05 NOTE — Progress Notes (Signed)
UR completed. Patient changed to inpatient- requiring IVF @ 75cc/hr 

## 2013-08-05 NOTE — Discharge Summary (Signed)
Physician Discharge Summary  Alexander Hubbard ZOX:[id number] DOB: [birth year] DOA: 08/04/2013  PCP: Leo Grosser, MD Primary Endocrinologist: Dr. Reather Littler. Primary Pulmonologist: Dr. Cyril Mourning. Primary Nephrologist: Dr. Casimiro Needle Primary Cardiologist: Dr. Donato Schultz    Admit date: 08/04/2013 Discharge date: 08/05/2013  Time spent: Less than 30 minutes  Recommendations for Outpatient Follow-up:  1. Dr. Lynnea Ferrier, PCP in 1 week.  2. Counseled compliance with nightly CPAP 3. Dr. Casimiro Needle, Nephrology on 08/11/13 (prior appt.) 4. Dr. Donato Schultz, Cardiology on 08/08/13 (prior appt). Request that he be seen with repeat labs (CBC & BMP)  Discharge Diagnoses:  Active Problems:   Diastolic CHF, chronic   HTN (hypertension)   Morbid obesity   Type II or unspecified type diabetes mellitus with neurological manifestations, uncontrolled   Diarrhea   Chronic kidney disease, stage III (moderate)   Syncope   Discharge Condition: Improved & Stable  Diet recommendation: Heart Healthy 7 Diabetic diet.  Filed Weights   08/04/13 0500 08/04/13 0821  Weight: 171.46 kg (378 lb) 173.5 kg (382 lb 8 oz)    History of present illness & Hospital Course:   58 year old male patient with history of morbid obesity, hypertension, insulin-dependent diabetes, chronic kidney disease, chronic diastolic CHF, OSA not compliant with nightly CPAP, awaiting weight reduction surgery at [facility], presented to the ED with an episode of syncope. Patient's grand kids apparently have been sick with nausea and vomiting. Her day prior to admission, patient had several episodes of diarrhea between 10 AM and 2 PM. After that he felt well and went to bed at night. At approximately 4 AM on 08/04/13, he woke up feeling nauseous and asked his wife to accompany him to the toilet. While ambulating he felt dizzy. Subsequently while sitting on the toilet, his wife heard a strange breathing sound and when she went in  found patient slumped over to his side and unresponsive and pale. His daughter called 911. He was unresponsive for an unknown amount of time but wife states that it maybe 5 minutes. No reported history of chest pain, palpitations or headache. No seizure like activity or strokelike symptoms. By the time EMS arrived, patient was alert and oriented and was not sure why his family was making a fuss. In the ED, code STEMI was initially called which was canceled by the cardiologist. He was admitted to the hospital. Initial orthostatic blood pressures were positive ( lying 150/77 >standing 151/42) and patient was briefly hypoglycemic at 65 mg per DL. His syncope was felt to be due to orthostatic hypotension and intravascular volume depletion from acute viral gastroenteritis. His diuretics were temporarily held and he was briefly hydrated. His orthostatic blood pressures today are negative. He has ambulated in the room without any dizziness, lightheadedness, chest pain or palpitations. Troponin x4 negative. 2-D echocardiogram and carotid Dopplers were attempted but unsuccessful secondary to body habitus-may consider outpatient evaluation with other modalities as deemed necessary. Telemetry shows normal sinus rhythm without arrhythmias. His creatinine is at baseline. Chronic anemia stable. Hypoglycemia has resolved and was likely secondary to poor oral intake in the context of a slightly worse than baseline renal function and insulins. According to his wife, patient is not compliant with his nightly CPAP. He has been counseled extensively regarding compliance with his medical treatment including CPAP use and he verbalizes understanding. We also has upcoming appointments with his cardiologist and nephrologist shortly and is advised to keep them.   Consultations:   None  Procedures:   None  Discharge Exam:  Complaints:  Patient denies complaints. Denies dizziness, lightheadedness, chest pain, palpitations or  headache. No further nausea, vomiting or diarrhea.  Filed Vitals:   08/05/13 0500 08/05/13 1442 08/05/13 1444 08/05/13 1446  BP: 139/47 148/71 133/60 161/69  Pulse: 79 81 87   Temp: 98 F (36.7 C)     TempSrc: Oral     Resp: 18     Height:      Weight:      SpO2: 98%       General exam:  Morbidly obese male lying comfortably supine in bed. Respiratory system:  Distant breath sounds but clear to auscultation . No increased work of breathing. Cardiovascular system: S1 & S2 heard, RRR. No JVD, murmurs, gallops, clicks. Trace bilateral leg edema. Telemetry: Sinus rhythm. Gastrointestinal system: Abdomen is  obese/nondistended, soft and nontender. Normal bowel sounds heard. Central nervous system: Alert and oriented. No focal neurological deficits. Extremities: Symmetric 5 x 5 power.  Discharge Instructions      Discharge Orders   Future Appointments Provider Department Dept Phone   08/09/2013 10:30 AM [facility] [phone]   08/16/2013 10:30 AM Larina Earthly, MD [facility] [phone]   Future Orders Complete By Expires   (HEART FAILURE PATIENTS) Call MD:  Anytime you have any of the following symptoms: 1) 3 pound weight gain in 24 hours or 5 pounds in 1 week 2) shortness of breath, with or without a dry hacking cough 3) swelling in the hands, feet or stomach 4) if you have to sleep on extra pillows at night in order to breathe.  As directed    Call MD for:  difficulty breathing, headache or visual disturbances  As directed    Call MD for:  extreme fatigue  As directed    Call MD for:  persistant dizziness or light-headedness  As directed    Call MD for:  persistant nausea and vomiting  As directed    Call MD for:  As directed    Comments:     Sensation of passing out.   Diet - low sodium heart healthy  As directed    Diet Carb Modified  As directed    Increase activity slowly  As directed         Medication List    STOP taking these medications       silver sulfADIAZINE 1 % cream  Commonly known as:  SILVADENE      TAKE these medications       ammonium lactate 12 % lotion  Commonly known as:  LAC-HYDRIN  Apply 1 application topically 2 (two) times daily as needed (legs).     aspirin 325 MG tablet  Take 325 mg by mouth daily.     BD ULTRA-FINE PEN NEEDLES 29G X 12.7MM Misc  Generic drug:  Insulin Pen Needle  USE AS DIRECTED     brinzolamide 1 % ophthalmic suspension  Commonly known as:  AZOPT  Place 1 drop into both eyes 2 (two) times daily.     carvedilol 12.5 MG tablet  Commonly known as:  COREG  Take 12.5 mg by mouth 2 (two) times daily with a meal.     colchicine 0.6 MG tablet  Take 0.6 mg by mouth daily.     diphenhydrAMINE 25 MG tablet  Commonly known as:  SOMINEX  Take 25 mg by mouth daily. Takes 15 minutes before iron pill  febuxostat 40 MG tablet  Commonly known as:  ULORIC  Take 40 mg by mouth daily.     Ferrous Fumarate-Vitamin C 65-125 MG Tabs  Take 65 mg by mouth daily.     fluticasone 50 MCG/ACT nasal spray  Commonly known as:  FLONASE  Place 2 sprays into both nostrils daily.     furosemide 40 MG tablet  Commonly known as:  LASIX  Take 40 mg by mouth 2 (two) times daily. 40mg  in am and 20mg  hs     gabapentin 300 MG capsule  Commonly known as:  NEURONTIN  Take 600 mg by mouth 3 (three) times daily.     glucose blood test strip  Commonly known as:  ACCU-CHEK SMARTVIEW  Use as instructed to check blood sugars 6 times per day dx code 250.62     Insulin Glargine 100 UNIT/ML Solostar Pen  Commonly known as:  LANTUS  Inject 20 units in the morning and 40 units at night.     insulin regular human CONCENTRATED 500 UNIT/ML Soln injection  Commonly known as:  HUMULIN R  Inject 0.01-0.02 mLs (5-10 Units total) into the skin 3 (three) times daily with meals. Inject 8 units in AM, 6 units at Lunch, and 10 units at dinner.      Insulin Syringe-Needle U-100 30G X 1/2" 0.3 ML Misc  Commonly known as:  B-D INS SYR ULTRAFINE .3CC/30G  USE AS DIRECTED 3 TIMES A DAY. DX CODE: 250.00     latanoprost 0.005 % ophthalmic solution  Commonly known as:  XALATAN  Place 1 drop into the left eye at bedtime.     lisinopril 10 MG tablet  Commonly known as:  PRINIVIL,ZESTRIL  Take 10 mg by mouth daily.     niacin 1000 MG CR tablet  Commonly known as:  NIASPAN  Take 1,000 mg by mouth at bedtime.     pantoprazole 40 MG tablet  Commonly known as:  PROTONIX  Take 40 mg by mouth 2 (two) times daily.     prednisoLONE acetate 1 % ophthalmic suspension  Commonly known as:  PRED FORTE  Place 1 drop into the right eye at bedtime.     rosuvastatin 20 MG tablet  Commonly known as:  CRESTOR  Take 20 mg by mouth daily.     timolol 0.5 % ophthalmic solution  Commonly known as:  TIMOPTIC  Place 1 drop into both eyes 2 (two) times daily.     vitamin B-12 1000 MCG tablet  Commonly known as:  CYANOCOBALAMIN  Take 1,000 mcg by mouth daily.       Follow-up Information   [facility]. Schedule an appointment as soon as possible for a visit in 3 days. (To be seen with repeat labs (CBC & BMP).)    Specialty:  Family Medicine   Contact information:   [phone] [place] [id number] [phone]        The results of significant diagnostics from this hospitalization (including imaging, microbiology, ancillary and laboratory) are listed below for reference.    Significant Diagnostic Studies: Dg Chest Port 1 View  08/04/2013   CLINICAL DATA:  Syncope  EXAM: PORTABLE CHEST - 1 VIEW  COMPARISON:  Prior radiograph from 09/21/2012  FINDINGS: Cardiomegaly is stable as compared to prior study.  The lungs are normally inflated. No airspace consolidation, pleural effusion, or pulmonary edema is identified. There is no pneumothorax.  No acute osseous abnormality identified.  IMPRESSION:  Stable cardiomegaly  without evidence of pulmonary edema or other acute abnormality.   Electronically Signed   By: Rise Mu M.D.   On: 08/04/2013 06:25    Microbiology: No results found for this or any previous visit (from the past 240 hour(s)).   Labs: Basic Metabolic Panel:  Recent Labs Lab 08/04/13 0502 08/05/13 0350  NA 134* 137  K 4.8 4.6  CL 95* 102  CO2 20 20  GLUCOSE 65* 187*  BUN 87* 79*  CREATININE 2.63* 2.18*  CALCIUM 9.3 8.5   Liver Function Tests:  Recent Labs Lab 08/04/13 0502  AST 15  ALT 11  ALKPHOS 41  BILITOT 0.3  PROT 6.9  ALBUMIN 3.5   No results found for this basename: LIPASE, AMYLASE,  in the last 168 hours No results found for this basename: AMMONIA,  in the last 168 hours CBC:  Recent Labs Lab 08/04/13 0502  WBC 7.3  NEUTROABS 4.5  HGB 11.1*  HCT 32.9*  MCV 80.8  PLT 190   Cardiac Enzymes:  Recent Labs Lab 08/04/13 0502 08/04/13 0910 08/04/13 1400 08/04/13 1926  TROPONINI <0.30 <0.30 <0.30 <0.30   BNP: BNP (last 3 results)  Recent Labs  09/22/12 0927  PROBNP 225.2*   CBG:  Recent Labs Lab 08/04/13 1127 08/04/13 1630 08/04/13 2155 08/05/13 0613 08/05/13 1122  GLUCAP 133* 194* 209* 155* 180*    Additional labs: 1.  2-D echo 3/6:15: Study Conclusions  Procedure narrative: Transthoracic echocardiography. Image quality wasuninterpretable. The study was technically difficult, as a result of poor sound wave transmission and body habitus.  Impressions:  - Very poor echo windows - the study is essentially uninterpretable. Administration of echo-enhancing agent may help, or consider cardiac MRI or TEE if further evaluation of LV function is desired. 2. VASCULAR LAB  PRELIMINARY PRELIMINARY PRELIMINARY PRELIMINARY  Carotid duplex completed.  Preliminary report: Technically difficult and limited exam due to body habitus and depth of vessels. The CCAs, proximal ICAs and vertebral arteries are patent. Unable to  evaluate/visualize much more.       Signed:  Marcellus Scott, MD, FACP, FHM. [facility] Pager [phone]  If 7PM-7AM, please contact night-coverage [email or web address] Password [facility] 08/05/2013, 4:47 PM

## 2013-08-08 ENCOUNTER — Ambulatory Visit: Payer: Medicaid Other | Admitting: Cardiology

## 2013-08-08 ENCOUNTER — Ambulatory Visit (INDEPENDENT_AMBULATORY_CARE_PROVIDER_SITE_OTHER): Payer: Medicaid Other | Admitting: Cardiology

## 2013-08-08 ENCOUNTER — Encounter: Payer: Self-pay | Admitting: Cardiology

## 2013-08-08 VITALS — BP 140/60 | HR 56 | Ht 70.0 in | Wt 384.0 lb

## 2013-08-08 DIAGNOSIS — G4733 Obstructive sleep apnea (adult) (pediatric): Secondary | ICD-10-CM

## 2013-08-08 DIAGNOSIS — R55 Syncope and collapse: Secondary | ICD-10-CM

## 2013-08-08 DIAGNOSIS — Z01810 Encounter for preprocedural cardiovascular examination: Secondary | ICD-10-CM

## 2013-08-08 DIAGNOSIS — N183 Chronic kidney disease, stage 3 unspecified: Secondary | ICD-10-CM

## 2013-08-08 DIAGNOSIS — I10 Essential (primary) hypertension: Secondary | ICD-10-CM

## 2013-08-08 DIAGNOSIS — E785 Hyperlipidemia, unspecified: Secondary | ICD-10-CM

## 2013-08-08 NOTE — Patient Instructions (Signed)
Your physician recommends that you continue on your current medications as directed. Please refer to the Current Medication list given to you today.  Your physician wants you to follow-up in: 1 year with Dr. Anne FuSkains. You will receive a reminder letter in the mail two months in advance. If you don't receive a letter, please call our office to schedule the follow-up appointment.  *Patient Cleared for Surgery.*

## 2013-08-08 NOTE — Progress Notes (Signed)
[address] [place], [place]  [address] Phone: [phone] Fax:  [phone]  Date:  08/08/2013   ID:  Alexander BouquetRiyad Hubbard, DOB Dec 15, 1955, MRN [id number]  PCP:  Alexander GrosserPICKARD,WARREN TOM, MD   History of Present Illness: Alexander Hubbard is a 58 y.o. male here for transition of care visit after hospitalization. He is a history of morbid obesity, hypertension, diabetes, chronic kidney disease, chronic diastolic heart failure, noncompliant obstructive sleep apnea CPAP, awaiting weight reduction surgery at [facility] for the last year or 2 who presented to the emergency department with syncope. He had several episodes of diarrhea likely coming from grand child sickness. Woke up with nausea, felt dizzy, while sitting on toilet she heard a strange breathing sound, slumped over, pale, unresponsive. 911. EMS arrived, they ended up calling a code STEMI originally which was canceled by cardiologist.  His syncope was felt to be due to orthostatic hypotension/vagal/decreased intravascular volume from acute viral gastroenteritis. His diuretics were temporarily held as he was hydrated. Troponins were negative. Echocardiogram and carotid Dopplers were attempted but unsuccessful due to body habitus.  EF probably 45%. NUC stress 4/13 low risk (reduced sensitivity due to attenuation artifact). Awaiting lap band surgery.  Currently he feels at baseline. No significant chest pain. No significant shortness of breath, in fact he feels better from a bleeding perspective as he has in a long time.    Wt Readings from Last 3 Encounters:  08/04/13 382 lb 8 oz (173.5 kg)  08/04/13 382 lb 8 oz (173.5 kg)  08/01/13 380 lb (172.367 kg)     Past Medical History  Diagnosis Date  . Diabetes mellitus   . Hypertension   . Obesity   . Sleep apnea   . Gouty arthritis     Past Surgical History  Procedure Laterality Date  . Eye surgery    . Knee surgery      Current Outpatient Prescriptions  Medication Sig  Dispense Refill  . ammonium lactate (LAC-HYDRIN) 12 % lotion Apply 1 application topically 2 (two) times daily as needed (legs).      Marland Kitchen. aspirin 325 MG tablet Take 325 mg by mouth daily.      . BD ULTRA-FINE PEN NEEDLES 29G X 12.7MM MISC USE AS DIRECTED  100 each  11  . brinzolamide (AZOPT) 1 % ophthalmic suspension Place 1 drop into both eyes 2 (two) times daily.        . carvedilol (COREG) 12.5 MG tablet Take 12.5 mg by mouth 2 (two) times daily with a meal.      . colchicine 0.6 MG tablet Take 0.6 mg by mouth daily.      . diphenhydrAMINE (SOMINEX) 25 MG tablet Take 25 mg by mouth daily. Takes 15 minutes before iron pill      . febuxostat (ULORIC) 40 MG tablet Take 40 mg by mouth daily.       . Ferrous Fumarate-Vitamin C 65-125 MG TABS Take 65 mg by mouth daily.  30 tablet  4  . fluticasone (FLONASE) 50 MCG/ACT nasal spray Place 2 sprays into both nostrils daily.  16 g  6  . furosemide (LASIX) 40 MG tablet Take 40 mg by mouth 2 (two) times daily. 40mg  in am and 20mg  hs      . gabapentin (NEURONTIN) 300 MG capsule Take 600 mg by mouth 3 (three) times daily.      Marland Kitchen. glucose blood (ACCU-CHEK SMARTVIEW) test strip Use as instructed to check blood  sugars 6 times per day dx code 250.62  200 each  5  . Insulin Glargine 100 UNIT/ML SOPN Inject 20 units in the morning and 40 units at night.      . insulin regular human CONCENTRATED (HUMULIN R) 500 UNIT/ML SOLN injection Inject 0.01-0.02 mLs (5-10 Units total) into the skin 3 (three) times daily with meals. Inject 8 units in AM, 6 units at Lunch, and 10 units at dinner.  1 vial  5  . Insulin Syringe-Needle U-100 (B-D INS SYR ULTRAFINE .3CC/30G) 30G X 1/2" 0.3 ML MISC USE AS DIRECTED 3 TIMES A DAY. DX CODE: 250.00  100 each  3  . latanoprost (XALATAN) 0.005 % ophthalmic solution Place 1 drop into the left eye at bedtime.       Marland Kitchen lisinopril (PRINIVIL,ZESTRIL) 10 MG tablet Take 10 mg by mouth daily.      . niacin (NIASPAN) 1000 MG CR tablet Take 1,000 mg by  mouth at bedtime.      . pantoprazole (PROTONIX) 40 MG tablet Take 40 mg by mouth 2 (two) times daily.      . prednisoLONE acetate (PRED FORTE) 1 % ophthalmic suspension Place 1 drop into the right eye at bedtime.       . rosuvastatin (CRESTOR) 20 MG tablet Take 20 mg by mouth daily.      . timolol (TIMOPTIC) 0.5 % ophthalmic solution Place 1 drop into both eyes 2 (two) times daily.      . vitamin B-12 (CYANOCOBALAMIN) 1000 MCG tablet Take 1,000 mcg by mouth daily.         No current facility-administered medications for this visit.    Allergies:    Allergies  Allergen Reactions  . Penicillins Swelling and Rash    Social History:  The patient  reports that he has never smoked. He has never used smokeless tobacco. He reports that he does not drink alcohol or use illicit drugs.   ROS:  Please see the history of present illness.    Denies any chest discomfort, no significant increase in shortness of breath, positive for isolated syncopal episode as described above, no rashes, no fevers, no cough.    PHYSICAL EXAM: VS:  There were no vitals taken for this visit. Well nourished, well developed, in no acute distress HEENT: normal Neck: no JVD, very thick Cardiac:  normal S1, S2; RRR; soft systolic murmurRight upper sternal border, distant heart sounds due to to body habitus Lungs:  clear to auscultation bilaterally, no wheezing, rhonchi or rales Abd: soft, nontender, no hepatomegalyMorbid obesity Ext: no edema Skin: warm and dry Neuro: no focal abnormalities noted  EKG:    08/04/13-Sinus rhythm, right bundle branch block, old inferior infarct pattern   ASSESSMENT AND PLAN:  1. Preoperative risk assessment prior to gastric surgery for weight loss-he is at moderate risk from a cardiovascular perspective from surgery. He has reached optimization from a cardiovascular perspective prior to surgery. Unfortunately, echocardiogram was of limited use during hospitalization secondary to body  habitus. He is not demonstrating any anginal symptoms during exertional activities. He is not having significant shortness of breath, in fact his breathing has improved since I have been seeing him. He is very eager to undergo surgery and understands that given his overall conditioning, he is at increased risk. Prior nuclear stress test was felt to be overall low risk without any large areas of ischemia however of course this was of decreased sensitivity and specificity. His recent isolated syncopal episode was secondary  to dehydration/vagal/orthostasis. He has not had any further symptoms after hydration. As was in the setting of diarrhea. He may proceed with surgery. He understands his risks.  2. Morbid obesity-awaiting weight reduction surgery. 3. Hypertension-currently controlled. 4. Diabetes-per primary team. 5. Obstructive sleep apnea-encourage use of CPAP. 6. Chronic kidney disease stage III-he is seeing nephrology soon. 7. We will see back in 6 months.  Signed, Donato Schultz, MD [facility]  08/08/2013 9:37 AM

## 2013-08-09 ENCOUNTER — Encounter (HOSPITAL_BASED_OUTPATIENT_CLINIC_OR_DEPARTMENT_OTHER): Payer: Medicaid Other

## 2013-08-15 ENCOUNTER — Encounter: Payer: Self-pay | Admitting: Vascular Surgery

## 2013-08-16 ENCOUNTER — Encounter (HOSPITAL_BASED_OUTPATIENT_CLINIC_OR_DEPARTMENT_OTHER): Payer: Medicaid Other

## 2013-08-16 ENCOUNTER — Ambulatory Visit (INDEPENDENT_AMBULATORY_CARE_PROVIDER_SITE_OTHER): Payer: Medicaid Other | Admitting: Vascular Surgery

## 2013-08-16 ENCOUNTER — Encounter: Payer: Self-pay | Admitting: Vascular Surgery

## 2013-08-16 VITALS — BP 151/80 | HR 74 | Resp 18 | Ht 70.0 in | Wt 377.5 lb

## 2013-08-16 DIAGNOSIS — I83893 Varicose veins of bilateral lower extremities with other complications: Secondary | ICD-10-CM

## 2013-08-16 NOTE — Progress Notes (Signed)
Problems with Activities of Daily Living Secondary to Leg Pain  1. Alexander Hubbard has difficulty with yard work, housework, and all activities that require prolonged standing due to leg pain.    2. Alexander Hubbard states that he is unable to exercise due to leg pain.    Failure of  Conservative Therapy:  1. Worn 20-30 mm Hg thigh high compression hose >3 months with no relief of symptoms.  2. Frequently elevates legs-no relief of symptoms  3. Taken Ibuprofen 600 Mg TID with no relief of symptoms.  Worsening the patient has no current open ulcerations. He's been doing quite well with his compression garments. This has made his legs feel some better although he does continue to have difficulty wearing these. He has a morbid obesity and is [facility]  an approximately one month.  I did reimage his legs with SonoSite ultrasound this does show an enlarged refluxing right great saphenous vein.  Impression and plan severe venous stasis disease with healed venous ulcers right leg. I have recommended laser ablation for a reduction of his venous hypertension. I would recommend that we defer this until after he is completely recovered from his gastric bypass and has had some significant weight loss. We will see him again in 3 months for continued followup. He will continue wearing his compression garments

## 2013-08-25 ENCOUNTER — Other Ambulatory Visit: Payer: Self-pay | Admitting: Podiatrist

## 2013-08-25 ENCOUNTER — Ambulatory Visit (INDEPENDENT_AMBULATORY_CARE_PROVIDER_SITE_OTHER): Payer: Medicaid Other | Admitting: Podiatrist

## 2013-08-25 ENCOUNTER — Encounter: Payer: Self-pay | Admitting: Podiatrist

## 2013-08-25 ENCOUNTER — Other Ambulatory Visit: Payer: Self-pay | Admitting: Family Medicine

## 2013-08-25 VITALS — BP 122/70 | HR 82 | Resp 16

## 2013-08-25 DIAGNOSIS — M79609 Pain in unspecified limb: Secondary | ICD-10-CM

## 2013-08-25 DIAGNOSIS — B351 Tinea unguium: Secondary | ICD-10-CM

## 2013-08-25 NOTE — Progress Notes (Signed)
  Subjective: Patient presents today with his wife for foot and nail care. The patient relatesthe ulcers on the tops of the toes on the left foot have all healed except the left 3rd toe which is nearly healed.  Patient also has painful thickened toenails of which she would like debrided at today's visit as well.  He is [facility] and has already lost 35 pounds  Objective: Neurovascular status is unchanged with neuropathy present and faintly palpable pedal pulses bilateral. The patient has absent hair growth and capillary refill time which is decreased bilateral. Patient's toenails are elongated, thickened and dystrophic. He has superficial excoriations noted on the left third toe which is healing well. A necrotic appearance is present superficially.   Assessment:  symptomatic mycotic nails   Plan: toenails debrided today without complication.  He will be seen back in 3 months or as needed.

## 2013-08-26 ENCOUNTER — Encounter: Payer: Self-pay | Admitting: Family Medicine

## 2013-08-26 NOTE — Telephone Encounter (Signed)
Delydia,  Refill request.  Joya SanValery

## 2013-08-30 ENCOUNTER — Other Ambulatory Visit: Payer: Self-pay | Admitting: *Deleted

## 2013-08-30 MED ORDER — AMMONIUM LACTATE 12 % EX LOTN: 1.0000 "application " | TOPICAL_LOTION | Freq: Two times a day (BID) | CUTANEOUS | Status: DC | PRN

## 2013-08-30 NOTE — Telephone Encounter (Signed)
Surescript emailed a refill request for LacHydrin lotion.  Refilled was okayed with 2 additional refills.

## 2013-09-01 ENCOUNTER — Telehealth: Payer: Self-pay | Admitting: *Deleted

## 2013-09-01 NOTE — Telephone Encounter (Signed)
He can stop taking it.

## 2013-09-01 NOTE — Telephone Encounter (Signed)
Patient came home from the hospital yesterday and they told him to crush up his niacin, he said after he took it he started itching "like crazy all over his body".   I asked him if he's taken benadryl and he said they only want him taking liquid and that he hasn't taken anything.  Please advise.

## 2013-09-01 NOTE — Telephone Encounter (Signed)
Noted, patient is aware. 

## 2013-09-07 ENCOUNTER — Ambulatory Visit: Payer: Medicaid Other | Admitting: Endocrinology

## 2013-09-09 ENCOUNTER — Ambulatory Visit (INDEPENDENT_AMBULATORY_CARE_PROVIDER_SITE_OTHER): Payer: Medicaid Other | Admitting: Endocrinology

## 2013-09-09 ENCOUNTER — Inpatient Hospital Stay: Payer: Medicaid Other | Admitting: Family Medicine

## 2013-09-09 ENCOUNTER — Encounter: Payer: Self-pay | Admitting: Endocrinology

## 2013-09-09 ENCOUNTER — Telehealth: Payer: Self-pay | Admitting: Endocrinology

## 2013-09-09 ENCOUNTER — Other Ambulatory Visit: Payer: Self-pay | Admitting: *Deleted

## 2013-09-09 VITALS — BP 142/88 | HR 66 | Temp 98.3°F | Resp 16 | Ht 68.0 in | Wt 363.2 lb

## 2013-09-09 DIAGNOSIS — I10 Essential (primary) hypertension: Secondary | ICD-10-CM

## 2013-09-09 DIAGNOSIS — E782 Mixed hyperlipidemia: Secondary | ICD-10-CM

## 2013-09-09 DIAGNOSIS — E1149 Type 2 diabetes mellitus with other diabetic neurological complication: Secondary | ICD-10-CM

## 2013-09-09 LAB — GLUCOSE, POCT (MANUAL RESULT ENTRY): POC Glucose: 115 mg/dl — AB (ref 70–99)

## 2013-09-09 MED ORDER — INSULIN REGULAR HUMAN 100 UNIT/ML IJ SOLN: INTRAMUSCULAR | Status: DC

## 2013-09-09 NOTE — Patient Instructions (Addendum)
Am insulin 18 units  And pm dose 15 units

## 2013-09-09 NOTE — Telephone Encounter (Signed)
Need clarification on humulin dosage

## 2013-09-09 NOTE — Progress Notes (Addendum)
Patient ID: Alexander BouquetRiyad Dirocco, male   DOB: 09-Nov-1955, 58 y.o.   MRN: [id number]   Reason for Appointment: Diabetes follow-up   History of Present Illness   Diagnosis: Type 2 DIABETES MELITUS, insulin requiring     PAST history: He has been on relatively large doses of insulin for a few years and was not well controlled until he started the U-500 insulin This particularly help his fasting hyperglycemia which was difficult to control. He only benefited partially from Victoza and this did not help him with weight loss significantly A1c had increased in 8/14  Insulin regimen: LANTUS 15 twice a day ; NovoLog 5 units only if glucose over 150  RECENT history: He had bariatric surgery about 10 days ago Since then he has been told to reduce his Lantus insulin from his previous dose of 60 units down to 15 units twice a day Also has been told to take only the regular insulin, U 100 as needed for blood sugars over 150 and he has not been taking the U-500 insulin He is using his wife's glucose monitor currently His blood sugars are mildly increased and relatively higher in the daytime compared to fasting Is already starting to lose a significant amount of weight and is using small portions of soft foods  Monitors blood glucose:  3 times a day   Glucometer: Accu-Chek        Blood Glucose readings from record review: FASTING 119-134 At 1 PM  128-157 8 pm 117-138 Hypoglycemia:  none recently            Physical activity: exercise: Is able to walk a little recently            Wt Readings from Last 3 Encounters:  09/09/13 363 lb 3.2 oz (164.746 kg)  08/16/13 377 lb 8 oz (171.233 kg)  08/08/13 384 lb (174.181 kg)   Diabetes labs:  Lab Results  Component Value Date   HGBA1C 7.2* 07/29/2013   HGBA1C 7.3* 04/07/2013   HGBA1C 8.0* 01/03/2013   Lab Results  Component Value Date   MICROALBUR 83.1* 01/03/2013   LDLCALC 29 05/12/2013   CREATININE 2.18* 08/05/2013    PROBLEM 2: He is asking about his anemia  and feeling excessively cold. He also gets somewhat out of breath on exertion He has not been told to start iron. Review of his recent labs show that he is iron deficient and hemoglobin is below 10. B12 is normal. Not clear of the etiology of his iron deficiency     Medication List       This list is accurate as of: 09/09/13  8:21 AM.  Always use your most recent med list.               ammonium lactate 12 % lotion  Commonly known as:  LAC-HYDRIN  APPLY TO LEGS 2 TIMES DAILY     ammonium lactate 12 % lotion  Commonly known as:  LAC-HYDRIN  Apply 1 application topically 2 (two) times daily as needed (legs).     aspirin EC 81 MG tablet  Take 81 mg by mouth daily.     aspirin 325 MG tablet  Take 325 mg by mouth daily.     B-D INS SYR ULTRAFINE .3CC/30G 30G X 1/2" 0.3 ML Misc  Generic drug:  Insulin Syringe-Needle U-100  USE AS DIRECTED 3 TIMES A DAY. DX CODE: 250.00     BD ULTRA-FINE PEN NEEDLES 29G X 12.7MM Misc  Generic drug:  Insulin Pen Needle  USE AS DIRECTED     brinzolamide 1 % ophthalmic suspension  Commonly known as:  AZOPT  Place 1 drop into both eyes 2 (two) times daily.     calcium carbonate 1250 MG chewable tablet  Commonly known as:  OS-CAL  Chew 1 tablet by mouth daily.     carvedilol 12.5 MG tablet  Commonly known as:  COREG  Take 12.5 mg by mouth 2 (two) times daily with a meal.     colchicine 0.6 MG tablet  TAKE 1 TABLET BY MOUTH EVERY DAY     diphenhydrAMINE 25 MG tablet  Commonly known as:  SOMINEX  Take 25 mg by mouth daily. Takes 15 minutes before iron pill     ULORIC 40 MG tablet  Generic drug:  febuxostat  TAKE 1 TABLET BY MOUTH EVERY DAY     febuxostat 40 MG tablet  Commonly known as:  ULORIC  Take 40 mg by mouth daily.     Ferrous Fumarate-Vitamin C 65-125 MG Tabs  Take 65 mg by mouth daily.     fluticasone 50 MCG/ACT nasal spray  Commonly known as:  FLONASE  Place 2 sprays into both nostrils daily.     furosemide 40 MG  tablet  Commonly known as:  LASIX  TAKE 1 TABLET BY MOUTH TWICE A DAY     gabapentin 300 MG capsule  Commonly known as:  NEURONTIN  Take 600 mg by mouth 3 (three) times daily.     glucose blood test strip  Commonly known as:  ACCU-CHEK SMARTVIEW  Use as instructed to check blood sugars 6 times per day dx code 250.62     CVS BLOOD GLUCOSE TEST STRIPS test strip  Generic drug:  glucose blood  Use as instructed to check blood sugars 6 times per day dx code 250.62     Insulin Glargine 100 UNIT/ML Solostar Pen  Commonly known as:  LANTUS  Inject 15 units 2 times daily.     insulin regular 100 units/mL injection  Commonly known as:  NOVOLIN R,HUMULIN R  Inject subcutaneously 8 units every morning, 6 units at lunch and 10 units at night     insulin regular human CONCENTRATED 500 UNIT/ML Soln injection  Commonly known as:  HUMULIN R  Inject 0.01-0.02 mLs (5-10 Units total) into the skin 3 (three) times daily with meals. Inject 8 units in AM, 6 units at Lunch, and 10 units at dinner.     latanoprost 0.005 % ophthalmic solution  Commonly known as:  XALATAN  Place 1 drop into the left eye at bedtime.     lisinopril 10 MG tablet  Commonly known as:  PRINIVIL,ZESTRIL  Take 10 mg by mouth daily.     niacin 1000 MG CR tablet  Commonly known as:  NIASPAN  Take 1,000 mg by mouth at bedtime.     pantoprazole 40 MG tablet  Commonly known as:  PROTONIX  Take 40 mg by mouth 2 (two) times daily.     prednisoLONE acetate 1 % ophthalmic suspension  Commonly known as:  PRED FORTE  Place 1 drop into the right eye at bedtime.     rosuvastatin 20 MG tablet  Commonly known as:  CRESTOR  Take 20 mg by mouth daily.     timolol 0.5 % ophthalmic solution  Commonly known as:  TIMOPTIC  Place 1 drop into both eyes 2 (two) times daily.     vitamin B-12 1000 MCG tablet  Commonly known as:  CYANOCOBALAMIN  Take 1,000 mcg by mouth.     VITRON-C 65-125 MG Tabs  Generic drug:  Iron-Vitamin C   Take 1 tablet by mouth.        Allergies:  Allergies  Allergen Reactions  . Penicillins Swelling and Rash  . Amoxicillin Rash and Swelling  . Penicillin G Rash    Past Medical History  Diagnosis Date  . Diabetes mellitus   . Hypertension   . Obesity   . Sleep apnea   . Gouty arthritis     Past Surgical History  Procedure Laterality Date  . Eye surgery    . Knee surgery      Family History  Problem Relation Age of Onset  . Hypertension Sister   . Diabetes Mellitus II Sister     Social History:  reports that he has never smoked. He has never used smokeless tobacco. He reports that he does not drink alcohol or use illicit drugs.  Review of Systems:  HYPERTENSION:  currently on Coreg  currently being treated by nephrologist, home reading 140/66. However he was told to get liquid formulation and has not started this yet  HYPERLIPIDEMIA: The lipid abnormality consists of elevated triglycerides. The niacin was stopped by  bariatric surgeon. He is taking Crestor for cardiovascular prophylaxis  Lab Results  Component Value Date   CHOL 94 05/12/2013   HDL 29.80* 05/12/2013   LDLCALC 29 05/12/2013   TRIG 174.0* 05/12/2013   CHOLHDL 3 05/12/2013   He has history of neuropathy  He does have history of nephropathy with moderate renal dysfunction and creatinine was 1.8 on the last measurement at discharge     Examination:   BP 142/88  Pulse 66  Temp(Src) 98.3 F (36.8 C)  Resp 16  Ht 5\' 8"  (1.727 m)  Wt 363 lb 3.2 oz (164.746 kg)  BMI 55.24 kg/m2  SpO2 96%  Body mass index is 55.24 kg/(m^2).   ASSESSMENT/ PLAN::   Diabetes type 2   His blood sugars appear to be fairly good now and overall he is  using markedly lower doses of Lantus since his bariatric surgery  Although he is checking readings only before meals he is not taking any mealtime insulin  Since his morning readings are  generally fairly good and afternoon readings are higher will increase evening  Lantus to 18 Discussed needing to check more readings after dinner   HYPERTENSION: Better controlled now, taking  Coreg, to start a liquid form for now     Reather Littler 09/09/2013, 8:21 AM

## 2013-09-12 ENCOUNTER — Ambulatory Visit: Payer: Medicaid Other | Admitting: Endocrinology

## 2013-09-12 NOTE — Telephone Encounter (Signed)
NovoLog 5 units before meals only if glucose over 150, not taking Humulin

## 2013-09-12 NOTE — Telephone Encounter (Signed)
Please advise, I do not see it in your notes

## 2013-09-13 ENCOUNTER — Encounter: Payer: Self-pay | Admitting: Family Medicine

## 2013-09-13 ENCOUNTER — Ambulatory Visit (INDEPENDENT_AMBULATORY_CARE_PROVIDER_SITE_OTHER): Payer: Medicaid Other | Admitting: Family Medicine

## 2013-09-13 VITALS — BP 136/74 | HR 68 | Temp 98.0°F | Resp 20 | Ht 68.0 in | Wt 357.0 lb

## 2013-09-13 DIAGNOSIS — Z09 Encounter for follow-up examination after completed treatment for conditions other than malignant neoplasm: Secondary | ICD-10-CM

## 2013-09-13 NOTE — Progress Notes (Signed)
Subjective:    Patient ID: Alexander Hubbard, male    DOB: 1955/07/11, 58 y.o.   MRN: [id number]  HPI Patient underwent gastric bypass on March 30 [facility].  Patient is doing extremely well after his hospitalization. He has no concerns today. He denies any fevers or chills. He denies any dysuria. His breathing is much better. He has lost approximately 20 pounds. Sugars are ranging 90-130. He is having no episodes of hypoglycemia. His blood pressure is good. He is not bradycardic.  He is currently on calcium supplement, B12 supplement. He states that his surgeon Bonita QuinLinda is to monitor his magnesium. Past Medical History  Diagnosis Date  . Diabetes mellitus   . Hypertension   . Obesity   . Sleep apnea   . Gouty arthritis    Past Surgical History  Procedure Laterality Date  . Eye surgery    . Knee surgery     Current Outpatient Prescriptions on File Prior to Visit  Medication Sig Dispense Refill  . ammonium lactate (LAC-HYDRIN) 12 % lotion Apply 1 application topically 2 (two) times daily as needed (legs).  400 g  2  . aspirin EC 81 MG tablet Take 81 mg by mouth daily.      . carvedilol (COREG) 12.5 MG tablet Take 12.5 mg by mouth 2 (two) times daily with a meal.      . colchicine 0.6 MG tablet TAKE 1 TABLET BY MOUTH EVERY DAY  30 tablet  0  . febuxostat (ULORIC) 40 MG tablet Take 40 mg by mouth daily.       Marland Kitchen. gabapentin (NEURONTIN) 300 MG capsule Take 600 mg by mouth 3 (three) times daily.      Marland Kitchen. glucose blood (ACCU-CHEK SMARTVIEW) test strip Use as instructed to check blood sugars 6 times per day dx code 250.62  200 each  5  . glucose blood (CVS BLOOD GLUCOSE TEST STRIPS) test strip Use as instructed to check blood sugars 6 times per day dx code 250.62      . Insulin Glargine 100 UNIT/ML SOPN Inject 15 units 2 times daily.      Marland Kitchen. latanoprost (XALATAN) 0.005 % ophthalmic solution Place 1 drop into the left eye at bedtime.       . pantoprazole (PROTONIX) 40 MG tablet Take 40 mg by  mouth 2 (two) times daily.      . prednisoLONE acetate (PRED FORTE) 1 % ophthalmic suspension Place 1 drop into the right eye at bedtime.       . rosuvastatin (CRESTOR) 20 MG tablet Take 20 mg by mouth daily.      . timolol (TIMOPTIC) 0.5 % ophthalmic solution Place 1 drop into both eyes 2 (two) times daily.      . vitamin B-12 (CYANOCOBALAMIN) 1000 MCG tablet Take 1,000 mcg by mouth. (Chewable tablet)      . BD ULTRA-FINE PEN NEEDLES 29G X 12.7MM MISC USE AS DIRECTED  100 each  11  . brinzolamide (AZOPT) 1 % ophthalmic suspension Place 1 drop into both eyes 2 (two) times daily.        . calcium carbonate (OS-CAL) 1250 MG chewable tablet Chew 1 tablet by mouth daily.      . Insulin Syringe-Needle U-100 (B-D INS SYR ULTRAFINE .3CC/30G) 30G X 1/2" 0.3 ML MISC USE AS DIRECTED 3 TIMES A DAY. DX CODE: 250.00       No current facility-administered medications on file prior to visit.   Allergies  Allergen Reactions  .  Penicillins Swelling and Rash  . Amoxicillin Rash and Swelling  . Penicillin G Rash   History   Social History  . Marital Status: Unknown    Spouse Name: N/A    Number of Children: N/A  . Years of Education: N/A   Occupational History  . Not on file.   Social History Main Topics  . Smoking status: Never Smoker   . Smokeless tobacco: Never Used  . Alcohol Use: No  . Drug Use: No  . Sexual Activity: Not on file   Other Topics Concern  . Not on file   Social History Narrative  . No narrative on file        Review of Systems  All other systems reviewed and are negative.      Objective:   Physical Exam  Vitals reviewed. Constitutional: He appears well-developed and well-nourished.  HENT:  Right Ear: External ear normal.  Left Ear: External ear normal.  Nose: Nose normal.  Mouth/Throat: Oropharynx is clear and moist. No oropharyngeal exudate.  Eyes: Conjunctivae are normal. No scleral icterus.  Neck: Neck supple. No thyromegaly present.  Cardiovascular:  Normal rate, regular rhythm and normal heart sounds.  Exam reveals no gallop and no friction rub.   No murmur heard. Pulmonary/Chest: Effort normal and breath sounds normal. No respiratory distress. He has no wheezes. He has no rales.  Abdominal: Soft. Bowel sounds are normal. He exhibits no distension. There is no tenderness. There is no rebound and no guarding.  Musculoskeletal: He exhibits no edema.  Lymphadenopathy:    He has no cervical adenopathy.          Assessment & Plan:  Hospital discharge follow-up - Plan: CBC with Differential, COMPLETE METABOLIC PANEL WITH GFR, Magnesium  Patient's incisions are clean dry and intact. There is no evidence of saline this. His surgery was performed laparoscopically. His blood pressure is excellent today. His heart rate is stable. Therefore I will not make any changes in his blood pressure medication. His blood sugars are excellent on 15 units of Lantus twice a day. Patient to call me if his blood sugars fall below 80 and gradually reduce his insulin as necessary. Recheck in one month or  immediately if necessary.

## 2013-09-14 LAB — CBC WITH DIFFERENTIAL/PLATELET
BASOS ABS: 0.1 10*3/uL (ref 0.0–0.1)
Basophils Relative: 1 % (ref 0–1)
EOS ABS: 0.3 10*3/uL (ref 0.0–0.7)
EOS PCT: 3 % (ref 0–5)
HCT: 30.4 % — ABNORMAL LOW (ref 39.0–52.0)
Hemoglobin: 10 g/dL — ABNORMAL LOW (ref 13.0–17.0)
LYMPHS PCT: 26 % (ref 12–46)
Lymphs Abs: 2.5 10*3/uL (ref 0.7–4.0)
MCH: 26.5 pg (ref 26.0–34.0)
MCHC: 32.9 g/dL (ref 30.0–36.0)
MCV: 80.6 fL (ref 78.0–100.0)
Monocytes Absolute: 0.6 10*3/uL (ref 0.1–1.0)
Monocytes Relative: 6 % (ref 3–12)
Neutro Abs: 6.1 10*3/uL (ref 1.7–7.7)
Neutrophils Relative %: 64 % (ref 43–77)
PLATELETS: 265 10*3/uL (ref 150–400)
RBC: 3.77 MIL/uL — ABNORMAL LOW (ref 4.22–5.81)
RDW: 14.3 % (ref 11.5–15.5)
WBC: 9.5 10*3/uL (ref 4.0–10.5)

## 2013-09-14 LAB — MAGNESIUM: Magnesium: 1.9 mg/dL (ref 1.5–2.5)

## 2013-09-14 LAB — COMPLETE METABOLIC PANEL WITH GFR
ALK PHOS: 65 U/L (ref 39–117)
ALT: 20 U/L (ref 0–53)
AST: 24 U/L (ref 0–37)
Albumin: 3.3 g/dL — ABNORMAL LOW (ref 3.5–5.2)
BUN: 38 mg/dL — ABNORMAL HIGH (ref 6–23)
CO2: 22 mEq/L (ref 19–32)
Calcium: 9.5 mg/dL (ref 8.4–10.5)
Chloride: 102 mEq/L (ref 96–112)
Creat: 1.73 mg/dL — ABNORMAL HIGH (ref 0.50–1.35)
GFR, Est African American: 50 mL/min — ABNORMAL LOW
GFR, Est Non African American: 43 mL/min — ABNORMAL LOW
Glucose, Bld: 81 mg/dL (ref 70–99)
Potassium: 4.4 mEq/L (ref 3.5–5.3)
SODIUM: 138 meq/L (ref 135–145)
TOTAL PROTEIN: 6.3 g/dL (ref 6.0–8.3)
Total Bilirubin: 0.4 mg/dL (ref 0.2–1.2)

## 2013-09-18 ENCOUNTER — Encounter (HOSPITAL_COMMUNITY): Payer: Self-pay | Admitting: Emergency Medicine

## 2013-09-18 ENCOUNTER — Other Ambulatory Visit: Payer: Self-pay

## 2013-09-18 ENCOUNTER — Observation Stay (HOSPITAL_COMMUNITY)
Admission: EM | Admit: 2013-09-18 | Discharge: 2013-09-19 | Disposition: A | Discharge status: Home / Self Care | Payer: Medicaid Other | Attending: Internal Medicine | Admitting: Internal Medicine

## 2013-09-18 DIAGNOSIS — R55 Syncope and collapse: Secondary | ICD-10-CM

## 2013-09-18 DIAGNOSIS — R609 Edema, unspecified: Secondary | ICD-10-CM

## 2013-09-18 DIAGNOSIS — Z880 Allergy status to penicillin: Secondary | ICD-10-CM | POA: Insufficient documentation

## 2013-09-18 DIAGNOSIS — Y939 Activity, unspecified: Secondary | ICD-10-CM | POA: Insufficient documentation

## 2013-09-18 DIAGNOSIS — M7989 Other specified soft tissue disorders: Secondary | ICD-10-CM

## 2013-09-18 DIAGNOSIS — Z7982 Long term (current) use of aspirin: Secondary | ICD-10-CM | POA: Insufficient documentation

## 2013-09-18 DIAGNOSIS — Y929 Unspecified place or not applicable: Secondary | ICD-10-CM | POA: Insufficient documentation

## 2013-09-18 DIAGNOSIS — G629 Polyneuropathy, unspecified: Secondary | ICD-10-CM

## 2013-09-18 DIAGNOSIS — I5032 Chronic diastolic (congestive) heart failure: Secondary | ICD-10-CM | POA: Diagnosis present

## 2013-09-18 DIAGNOSIS — M79609 Pain in unspecified limb: Secondary | ICD-10-CM

## 2013-09-18 DIAGNOSIS — I872 Venous insufficiency (chronic) (peripheral): Secondary | ICD-10-CM

## 2013-09-18 DIAGNOSIS — G4733 Obstructive sleep apnea (adult) (pediatric): Secondary | ICD-10-CM

## 2013-09-18 DIAGNOSIS — N183 Chronic kidney disease, stage 3 unspecified: Secondary | ICD-10-CM

## 2013-09-18 DIAGNOSIS — T43591A Poisoning by other antipsychotics and neuroleptics, accidental (unintentional), initial encounter: Secondary | ICD-10-CM | POA: Insufficient documentation

## 2013-09-18 DIAGNOSIS — E1149 Type 2 diabetes mellitus with other diabetic neurological complication: Secondary | ICD-10-CM

## 2013-09-18 DIAGNOSIS — N184 Chronic kidney disease, stage 4 (severe): Secondary | ICD-10-CM

## 2013-09-18 DIAGNOSIS — G4730 Sleep apnea, unspecified: Secondary | ICD-10-CM | POA: Insufficient documentation

## 2013-09-18 DIAGNOSIS — E119 Type 2 diabetes mellitus without complications: Secondary | ICD-10-CM | POA: Insufficient documentation

## 2013-09-18 DIAGNOSIS — Z794 Long term (current) use of insulin: Secondary | ICD-10-CM | POA: Insufficient documentation

## 2013-09-18 DIAGNOSIS — N189 Chronic kidney disease, unspecified: Secondary | ICD-10-CM | POA: Diagnosis present

## 2013-09-18 DIAGNOSIS — T465X1A Poisoning by other antihypertensive drugs, accidental (unintentional), initial encounter: Principal | ICD-10-CM | POA: Insufficient documentation

## 2013-09-18 DIAGNOSIS — T50901A Poisoning by unspecified drugs, medicaments and biological substances, accidental (unintentional), initial encounter: Secondary | ICD-10-CM

## 2013-09-18 DIAGNOSIS — M109 Gout, unspecified: Secondary | ICD-10-CM

## 2013-09-18 DIAGNOSIS — E785 Hyperlipidemia, unspecified: Secondary | ICD-10-CM

## 2013-09-18 DIAGNOSIS — R6510 Systemic inflammatory response syndrome (SIRS) of non-infectious origin without acute organ dysfunction: Secondary | ICD-10-CM

## 2013-09-18 DIAGNOSIS — N289 Disorder of kidney and ureter, unspecified: Secondary | ICD-10-CM | POA: Insufficient documentation

## 2013-09-18 DIAGNOSIS — Z79899 Other long term (current) drug therapy: Secondary | ICD-10-CM | POA: Insufficient documentation

## 2013-09-18 DIAGNOSIS — R0902 Hypoxemia: Secondary | ICD-10-CM

## 2013-09-18 DIAGNOSIS — R197 Diarrhea, unspecified: Secondary | ICD-10-CM

## 2013-09-18 DIAGNOSIS — R079 Chest pain, unspecified: Secondary | ICD-10-CM

## 2013-09-18 DIAGNOSIS — I83893 Varicose veins of bilateral lower extremities with other complications: Secondary | ICD-10-CM

## 2013-09-18 DIAGNOSIS — IMO0001 Reserved for inherently not codable concepts without codable children: Secondary | ICD-10-CM

## 2013-09-18 DIAGNOSIS — J209 Acute bronchitis, unspecified: Secondary | ICD-10-CM

## 2013-09-18 DIAGNOSIS — I10 Essential (primary) hypertension: Secondary | ICD-10-CM | POA: Diagnosis present

## 2013-09-18 NOTE — ED Notes (Signed)
Patient presents after taking extra meds  See narrative notes

## 2013-09-18 NOTE — ED Notes (Signed)
Call received from "CJ" [facility]): pt has had accidental carvedilol OD, was to receive 4mg , but accidentally got 32.5mg . 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  Recently had meds adjusted/ re-dosed d/t gastric bypass, carvedilol switched to liquid form, dosing error at home resulted in OD. Concerns for prolonged QRS, bradycardia, hypoglycemia, prolonged QTc, possible torsades. Treat with fluids 1st line and glucagon 2nd. BB and [facility] info sheet faxed. Med taken ~ 1hr ago. [facility]  Pt arrives with wife, pt pushed in w/c, denies sx at this time.

## 2013-09-19 ENCOUNTER — Encounter (HOSPITAL_COMMUNITY): Payer: Self-pay | Admitting: Internal Medicine

## 2013-09-19 DIAGNOSIS — I509 Heart failure, unspecified: Secondary | ICD-10-CM

## 2013-09-19 DIAGNOSIS — E119 Type 2 diabetes mellitus without complications: Secondary | ICD-10-CM

## 2013-09-19 DIAGNOSIS — E785 Hyperlipidemia, unspecified: Secondary | ICD-10-CM

## 2013-09-19 DIAGNOSIS — T50901A Poisoning by unspecified drugs, medicaments and biological substances, accidental (unintentional), initial encounter: Secondary | ICD-10-CM | POA: Diagnosis present

## 2013-09-19 DIAGNOSIS — E1149 Type 2 diabetes mellitus with other diabetic neurological complication: Secondary | ICD-10-CM

## 2013-09-19 DIAGNOSIS — I5032 Chronic diastolic (congestive) heart failure: Secondary | ICD-10-CM

## 2013-09-19 DIAGNOSIS — N184 Chronic kidney disease, stage 4 (severe): Secondary | ICD-10-CM

## 2013-09-19 DIAGNOSIS — Z794 Long term (current) use of insulin: Secondary | ICD-10-CM

## 2013-09-19 DIAGNOSIS — I10 Essential (primary) hypertension: Secondary | ICD-10-CM

## 2013-09-19 DIAGNOSIS — G4733 Obstructive sleep apnea (adult) (pediatric): Secondary | ICD-10-CM

## 2013-09-19 LAB — BASIC METABOLIC PANEL
BUN: 42 mg/dL — AB (ref 6–23)
CHLORIDE: 101 meq/L (ref 96–112)
CO2: 23 mEq/L (ref 19–32)
Calcium: 9.7 mg/dL (ref 8.4–10.5)
Creatinine, Ser: 1.8 mg/dL — ABNORMAL HIGH (ref 0.50–1.35)
GFR calc Af Amer: 46 mL/min — ABNORMAL LOW (ref >90)
GFR calc non Af Amer: 40 mL/min — ABNORMAL LOW (ref >90)
GLUCOSE: 138 mg/dL — AB (ref 70–99)
POTASSIUM: 4.3 meq/L (ref 3.7–5.3)
Sodium: 138 mEq/L (ref 137–147)

## 2013-09-19 LAB — GLUCOSE, CAPILLARY
Glucose-Capillary: 131 mg/dL — ABNORMAL HIGH (ref 70–99)
Glucose-Capillary: 114 mg/dL — ABNORMAL HIGH (ref 70–99)

## 2013-09-19 LAB — CBC WITH DIFFERENTIAL/PLATELET
Basophils Absolute: 0 10*3/uL (ref 0.0–0.1)
Basophils Relative: 0 % (ref 0–1)
Eosinophils Absolute: 0.4 10*3/uL (ref 0.0–0.7)
Eosinophils Relative: 5 % (ref 0–5)
HCT: 30.3 % — ABNORMAL LOW (ref 39.0–52.0)
HEMOGLOBIN: 10 g/dL — AB (ref 13.0–17.0)
Lymphocytes Relative: 27 % (ref 12–46)
Lymphs Abs: 2.1 10*3/uL (ref 0.7–4.0)
MCH: 27.2 pg (ref 26.0–34.0)
MCHC: 33 g/dL (ref 30.0–36.0)
MCV: 82.3 fL (ref 78.0–100.0)
MONOS PCT: 6 % (ref 3–12)
Monocytes Absolute: 0.4 10*3/uL (ref 0.1–1.0)
NEUTROS ABS: 4.8 10*3/uL (ref 1.7–7.7)
NEUTROS PCT: 62 % (ref 43–77)
Platelets: 210 10*3/uL (ref 150–400)
RBC: 3.68 MIL/uL — AB (ref 4.22–5.81)
RDW: 13.5 % (ref 11.5–15.5)
WBC: 7.7 10*3/uL (ref 4.0–10.5)

## 2013-09-19 LAB — CBG MONITORING, ED
GLUCOSE-CAPILLARY: 121 mg/dL — AB (ref 70–99)
Glucose-Capillary: 141 mg/dL — ABNORMAL HIGH (ref 70–99)

## 2013-09-19 LAB — MAGNESIUM: MAGNESIUM: 2 mg/dL (ref 1.5–2.5)

## 2013-09-19 MED ORDER — SODIUM CHLORIDE 0.9 % IJ SOLN
3.0000 mL | Freq: Two times a day (BID) | INTRAMUSCULAR | Status: DC
  Administered 2013-09-19: 3 mL via INTRAVENOUS

## 2013-09-19 MED ORDER — LATANOPROST 0.005 % OP SOLN
1.0000 [drp] | Freq: Every day | OPHTHALMIC | Status: DC
  Filled 2013-09-19: qty 2.5

## 2013-09-19 MED ORDER — PREDNISOLONE ACETATE 1 % OP SUSP
1.0000 [drp] | Freq: Every day | OPHTHALMIC | Status: DC
  Filled 2013-09-19: qty 1

## 2013-09-19 MED ORDER — SODIUM CHLORIDE 0.9 % IJ SOLN: 3.0000 mL | Freq: Two times a day (BID) | INTRAMUSCULAR | Status: DC

## 2013-09-19 MED ORDER — CALCIUM CARBONATE 1250 (500 CA) MG PO TABS
1250.0000 mg | ORAL_TABLET | Freq: Every day | ORAL | Status: DC
  Filled 2013-09-19 (×2): qty 1

## 2013-09-19 MED ORDER — PANTOPRAZOLE SODIUM 40 MG PO TBEC: 40.0000 mg | DELAYED_RELEASE_TABLET | Freq: Two times a day (BID) | ORAL | Status: DC

## 2013-09-19 MED ORDER — INSULIN ASPART 100 UNIT/ML ~~LOC~~ SOLN: 0.0000 [IU] | Freq: Three times a day (TID) | SUBCUTANEOUS | Status: DC

## 2013-09-19 MED ORDER — ACETAMINOPHEN 650 MG RE SUPP: 650.0000 mg | Freq: Four times a day (QID) | RECTAL | Status: DC | PRN

## 2013-09-19 MED ORDER — GABAPENTIN 600 MG PO TABS
600.0000 mg | ORAL_TABLET | Freq: Three times a day (TID) | ORAL | Status: DC
  Filled 2013-09-19 (×3): qty 1

## 2013-09-19 MED ORDER — VITAMIN B-12 1000 MCG PO TABS
1000.0000 ug | ORAL_TABLET | Freq: Every day | ORAL | Status: DC
  Filled 2013-09-19: qty 1

## 2013-09-19 MED ORDER — BRINZOLAMIDE 1 % OP SUSP
1.0000 [drp] | Freq: Two times a day (BID) | OPHTHALMIC | Status: DC
  Filled 2013-09-19: qty 10

## 2013-09-19 MED ORDER — ATORVASTATIN CALCIUM 40 MG PO TABS
40.0000 mg | ORAL_TABLET | Freq: Every day | ORAL | Status: DC
  Filled 2013-09-19: qty 1

## 2013-09-19 MED ORDER — TIMOLOL MALEATE 0.5 % OP SOLN: 1.0000 [drp] | Freq: Two times a day (BID) | OPHTHALMIC | Status: DC

## 2013-09-19 MED ORDER — FEBUXOSTAT 40 MG PO TABS
40.0000 mg | ORAL_TABLET | Freq: Every day | ORAL | Status: DC
  Filled 2013-09-19: qty 1

## 2013-09-19 MED ORDER — ASPIRIN EC 81 MG PO TBEC
81.0000 mg | DELAYED_RELEASE_TABLET | Freq: Every day | ORAL | Status: DC
  Filled 2013-09-19: qty 1

## 2013-09-19 MED ORDER — INSULIN GLARGINE 100 UNIT/ML ~~LOC~~ SOLN
15.0000 [IU] | Freq: Two times a day (BID) | SUBCUTANEOUS | Status: DC
  Administered 2013-09-19: 15 [IU] via SUBCUTANEOUS
  Filled 2013-09-19 (×2): qty 0.15

## 2013-09-19 MED ORDER — ONDANSETRON HCL 4 MG PO TABS: 4.0000 mg | ORAL_TABLET | Freq: Four times a day (QID) | ORAL | Status: DC | PRN

## 2013-09-19 MED ORDER — COLCHICINE 0.6 MG PO TABS
0.6000 mg | ORAL_TABLET | Freq: Every day | ORAL | Status: DC
  Filled 2013-09-19: qty 1

## 2013-09-19 MED ORDER — ONDANSETRON HCL 4 MG/2ML IJ SOLN: 4.0000 mg | Freq: Four times a day (QID) | INTRAMUSCULAR | Status: DC | PRN

## 2013-09-19 MED ORDER — ACETAMINOPHEN 325 MG PO TABS: 650.0000 mg | ORAL_TABLET | Freq: Four times a day (QID) | ORAL | Status: DC | PRN

## 2013-09-19 MED ORDER — ENOXAPARIN SODIUM 80 MG/0.8ML ~~LOC~~ SOLN
80.0000 mg | Freq: Every day | SUBCUTANEOUS | Status: DC
  Administered 2013-09-19: 80 mg via SUBCUTANEOUS
  Filled 2013-09-19: qty 0.8

## 2013-09-19 NOTE — ED Notes (Signed)
Admitting MD at bedside.

## 2013-09-19 NOTE — ED Notes (Signed)
Spoke with poison control and updated them with pt's care. Continue monitoring pt.

## 2013-09-19 NOTE — ED Notes (Signed)
CBG= 121

## 2013-09-19 NOTE — Progress Notes (Signed)
D/c orders received;IV removed with gauze on, pt remains in stable condition, pt meds and instructions reviewed and given to pt; pt d/c to home 

## 2013-09-19 NOTE — Discharge Summary (Signed)
Physician Discharge Summary  Alexander Hubbard UEA:[id number] DOB: 16-Jul-1955 DOA: 09/18/2013  PCP: Leo GrosserPICKARD,WARREN TOM, MD  Admit date: 09/18/2013 Discharge date: 09/19/2013  Time spent: 30 minutes  Recommendations for Outpatient Follow-up:  Patient will be discharged to home. He saw his primary care physician within one week of discharge. Patient should continue taking his medications as prescribed.  Discharge Diagnoses:  Principal Problem:   Accidental drug overdose Active Problems:   OSA (obstructive sleep apnea)   Diastolic CHF, chronic   HTN (hypertension)   Renal failure, chronic   Discharge Condition: Stable  Diet recommendation: Heart healthy, bariatric  Filed Weights   09/18/13 2335 09/19/13 0307  Weight: 157.852 kg (348 lb) 158.305 kg (349 lb)    History of present illness:  Alexander BouquetRiyad Cail is a 58 y.o. male history of CHF, hypertension, OSA, gout, diabetes mellitus who has had a [facility] had an accidental overdose of beta blocker Coreg. Patient is presently on liquid form of Coreg and was supposed to take 5 mL and accidentally took 13 mL. The medication dose was confused with PPI. Patient with a dose around 9 PM following which patient had mild headache which has resolved. His wife after half an hour realized the mistake and brought the patient to the hospital. In the ER patient's heart rate is around 60-70 blood pressure more than 100 systolic. Patient at this time be admitted for observation. Patient denies any chest pain shortness of breath dizziness loss of consciousness. Denies any nausea vomiting abdominal pain diarrhea.   Hospital Course:  Accidental overdose of Coreg -Coreg as well as to monitor how to work held at admission -Patient currently asymptomatic, heart rate and blood pressure are both stable -Patient may resume his Coreg as well as Demerol at discharge -Spoke with patient and wife regarding careful dosing and monitoring of his  medications and possibly using medication/pill box  CHF, unknown whether systolic or diastolic -Patient compensated not in exacerbation -Last echocardiogram on 08/05/2013 shows poor echo windows, studies essentially uninterpretable -Patient did not have any crackles on lung exam will order a lower extremity swelling  Hypertension -Patient may resume his Coreg at discharge, he was held at admission  Diabetes mellitus type 2 -Continue home insulin regimen with CBG monitoring  Gout -Continue to uloric and colchicine  Chronic kidney disease, 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   Neck: Supple, no JVD, no masses  Cardiovascular: S1 S2 auscultated, no rubs, murmurs or gallops. Regular rate and rhythm.  Respiratory: Clear to auscultation bilaterally with equal chest rise  Abdomen: Soft, obese, nontender, nondistended, + bowel sounds, well-healed incisions on the abdomen  Extremities: warm dry without cyanosis clubbing or edema  Neuro: AAOx3, no focal deficits  Skin: Without rashes exudates or nodules  Psych: Normal affect and demeanor with intact judgement and insight  Discharge Instructions   Future Appointments Provider Department Dept Phone   10/07/2013 10:30 AM [facility] [phone]   10/13/2013 11:00 AM Reather LittlerAjay Kumar, MD [facility]  Endocrinology [phone]   10/14/2013 10:15 AM Donita Brooks, MD [facility]  [phone]   10/27/2013 2:45 PM Marlowe Aschoff, DPM [facility] [phone]   11/22/2013 9:45 AM Larina Earthly, MD [facility] [phone]       Medication List    ASK your doctor about these medications       ammonium lactate 12 % lotion  Commonly known as:  LAC-HYDRIN  Apply 1 application topically 2 (two) times daily as needed (legs).     aspirin EC 81 MG tablet  Take 81 mg by mouth daily.     B-D INS SYR ULTRAFINE .3CC/30G 30G X 1/2" 0.3 ML Misc  Generic drug:  Insulin Syringe-Needle U-100  USE AS DIRECTED 3 TIMES A DAY. DX CODE: 250.00     BD ULTRA-FINE PEN NEEDLES 29G X 12.7MM Misc  Generic drug:  Insulin Pen Needle  USE AS DIRECTED     brinzolamide 1 % ophthalmic suspension  Commonly known as:  AZOPT  Place 1 drop into both eyes 2 (two) times daily.     calcium-vitamin D 500-200 MG-UNIT per tablet  Commonly known as:  OSCAL WITH D  Take 1 tablet by mouth 3 (three) times daily.     carvedilol 12.5 MG tablet  Commonly known as:  COREG  Take 10 mg by mouth 2 (two) times daily with a meal.     colchicine 0.6 MG tablet  TAKE 1 TABLET BY MOUTH EVERY DAY     febuxostat 40 MG tablet  Commonly known as:  ULORIC  Take 40 mg by mouth daily.     gabapentin 300 MG capsule  Commonly known as:  NEURONTIN  Take 600 mg by mouth 2 (two) times daily.     glucose blood test strip  Commonly known as:  ACCU-CHEK SMARTVIEW  Use as instructed to check blood sugars 6 times per day dx code 250.62     [employer] BLOOD GLUCOSE TEST STRIPS test strip  Generic drug:  glucose blood  Use as instructed to check blood sugars 6 times per day dx code 250.62     Insulin Glargine 100 UNIT/ML Solostar Pen  Commonly known as:  LANTUS  Inject 15 units 2 times daily.     latanoprost 0.005 % ophthalmic solution  Commonly known as:  XALATAN  Place 1 drop into the left eye at bedtime.     multivitamin capsule  Take 1 capsule by mouth 2 (two)  times daily.     pantoprazole 40 MG tablet  Commonly known as:  PROTONIX  Take 40 mg by mouth 2 (two) times daily.     prednisoLONE acetate 1 % ophthalmic suspension  Commonly known as:  PRED FORTE  Place 1 drop into the right eye at bedtime.     rosuvastatin 20 MG tablet  Commonly known as:  CRESTOR  Take 20 mg by mouth daily.     timolol 0.5 % ophthalmic solution  Commonly known as:  TIMOPTIC  Place 1 drop into both eyes 2 (two) times daily.     vitamin B-12 1000 MCG tablet  Commonly known as:  CYANOCOBALAMIN  Take 1,000 mcg by mouth. (Chewable tablet)       Allergies  Allergen Reactions  . Penicillins Swelling and Rash  . Amoxicillin Rash and Swelling  . Penicillin G Rash     The results of significant diagnostics from this hospitalization (including imaging, microbiology, ancillary and laboratory) are  listed below for reference.    Significant Diagnostic Studies: No results found.  Microbiology: No results found for this or any previous visit (from the past 240 hour(s)).   Labs: Basic Metabolic Panel:  Recent Labs Lab 09/13/13 1452 09/19/13 0030  NA 138 138  K 4.4 4.3  CL 102 101  CO2 22 23  GLUCOSE 81 138*  BUN 38* 42*  CREATININE 1.73* 1.80*  CALCIUM 9.5 9.7  MG 1.9 2.0   Liver Function Tests:  Recent Labs Lab 09/13/13 1452  AST 24  ALT 20  ALKPHOS 65  BILITOT 0.4  PROT 6.3  ALBUMIN 3.3*   No results found for this basename: LIPASE, AMYLASE,  in the last 168 hours No results found for this basename: AMMONIA,  in the last 168 hours CBC:  Recent Labs Lab 09/13/13 1452 09/19/13 0030  WBC 9.5 7.7  NEUTROABS 6.1 4.8  HGB 10.0* 10.0*  HCT 30.4* 30.3*  MCV 80.6 82.3  PLT 265 210   Cardiac Enzymes: No results found for this basename: CKTOTAL, CKMB, CKMBINDEX, TROPONINI,  in the last 168 hours BNP: BNP (last 3 results)  Recent Labs  09/22/12 0927  PROBNP 225.2*   CBG:  Recent Labs Lab 09/19/13 0007 09/19/13 0147  09/19/13 0304  GLUCAP 141* 121* 131*       Signed:  Nita SellsMaryann Hamsa Laurich  [facility] 09/19/2013, 7:58 AM

## 2013-09-19 NOTE — ED Provider Notes (Signed)
CSN: [id number]     Arrival date & time 09/18/13  2320 History   First MD Initiated Contact with Patient 09/19/13 0000     Chief Complaint  Patient presents with  . Drug Overdose     (Consider location/radiation/quality/duration/timing/severity/associated sxs/prior Treatment) Patient is a 58 y.o. male presenting with Overdose. The history is provided by the patient.  Drug Overdose  He is 3 weeks post gastric bypass surgery and his medications were switched to liquid. He is getting liquid carvedilol and liquids tend to resolve. He he was supposed to get a dose of 2 mL of carvedilol, but his wife accidentally gave him that dose of pantoprazole which is 13.3 mL. His carvedilol is 2.5 mg/mL which means that is a dose that you received tonight was approximately 33 mg. The medication was given at about 9:30 PM. His wife called poison control and she realized the mistake and he was brought to the ED. He states that he feels fine. Is not having any nausea or dizziness.  Past Medical History  Diagnosis Date  . Diabetes mellitus   . Hypertension   . Obesity   . Sleep apnea   . Gouty arthritis    Past Surgical History  Procedure Laterality Date  . Eye surgery    . Knee surgery    . Gastric bypass     Family History  Problem Relation Age of Onset  . Hypertension Sister   . Diabetes Mellitus II Sister    History  Substance Use Topics  . Smoking status: Never Smoker   . Smokeless tobacco: Never Used  . Alcohol Use: No    Review of Systems  All other systems reviewed and are negative.     Allergies  Penicillins; Amoxicillin; and Penicillin g  Home Medications   Prior to Admission medications   Medication Sig Start Date End Date Taking? Authorizing Provider  ammonium lactate (LAC-HYDRIN) 12 % lotion Apply 1 application topically 2 (two) times daily as needed (legs). 08/30/13   Marlowe AschoffKathryn Egerton, DPM  aspirin EC 81 MG tablet Take 81 mg by mouth daily.    Historical Provider, MD   BD ULTRA-FINE PEN NEEDLES 29G X 12.7MM MISC USE AS DIRECTED    Donita BrooksWarren T Pickard, MD  brinzolamide (AZOPT) 1 % ophthalmic suspension Place 1 drop into both eyes 2 (two) times daily.      Historical Provider, MD  calcium carbonate (OS-CAL) 1250 MG chewable tablet Chew 1 tablet by mouth daily.    Historical Provider, MD  carvedilol (COREG) 12.5 MG tablet Take 12.5 mg by mouth 2 (two) times daily with a meal.    Historical Provider, MD  colchicine 0.6 MG tablet TAKE 1 TABLET BY MOUTH EVERY DAY    Donita BrooksWarren T Pickard, MD  febuxostat (ULORIC) 40 MG tablet Take 40 mg by mouth daily.     Historical Provider, MD  gabapentin (NEURONTIN) 300 MG capsule Take 600 mg by mouth 3 (three) times daily.    Historical Provider, MD  glucose blood (ACCU-CHEK SMARTVIEW) test strip Use as instructed to check blood sugars 6 times per day dx code 250.62 06/06/13   Reather LittlerAjay Kumar, MD  glucose blood (CVS BLOOD GLUCOSE TEST STRIPS) test strip Use as instructed to check blood sugars 6 times per day dx code 250.62 06/06/13   Historical Provider, MD  Insulin Glargine 100 UNIT/ML SOPN Inject 15 units 2 times daily. 04/07/13   Reather LittlerAjay Kumar, MD  Insulin Syringe-Needle U-100 (B-D INS SYR ULTRAFINE .3CC/30G) 30G X  1/2" 0.3 ML MISC USE AS DIRECTED 3 TIMES A DAY. DX CODE: 250.00 07/06/13   Historical Provider, MD  latanoprost (XALATAN) 0.005 % ophthalmic solution Place 1 drop into the left eye at bedtime.     Historical Provider, MD  pantoprazole (PROTONIX) 40 MG tablet Take 40 mg by mouth 2 (two) times daily.    Historical Provider, MD  prednisoLONE acetate (PRED FORTE) 1 % ophthalmic suspension Place 1 drop into the right eye at bedtime.     Historical Provider, MD  rosuvastatin (CRESTOR) 20 MG tablet Take 20 mg by mouth daily.    Historical Provider, MD  timolol (TIMOPTIC) 0.5 % ophthalmic solution Place 1 drop into both eyes 2 (two) times daily.    Historical Provider, MD  vitamin B-12 (CYANOCOBALAMIN) 1000 MCG tablet Take 1,000 mcg by mouth.  (Chewable tablet)    Historical Provider, MD   BP 141/54  Pulse 57  Temp(Src) 98.2 F (36.8 C) (Oral)  Resp 20  Ht 5\' 10"  (1.778 m)  Wt 348 lb (157.852 kg)  BMI 49.93 kg/m2  SpO2 95% Physical Exam  Nursing note and vitals reviewed.  Morbidly obese 58 year old male, resting comfortably and in no acute distress. Vital signs are significant for bradycardia with heart rate 57, and borderline hypertension with blood pressure 141/54. Oxygen saturation is 95%, which is normal. Head is normocephalic and atraumatic. PERRLA, EOMI. Oropharynx is clear. Neck is nontender and supple without adenopathy or JVD. Back is nontender and there is no CVA tenderness. Lungs are clear without rales, wheezes, or rhonchi. Chest is nontender. Heart has regular rate and rhythm without murmur. Abdomen is soft, flat, nontender without masses or hepatosplenomegaly and peristalsis is normoactive. Extremities have trace edema, full range of motion is present. Moderate venous stasis changes are present. Skin is warm and dry without rash. Neurologic: Mental status is normal, cranial nerves are intact, there are no motor or sensory deficits.  ED Course  Procedures (including critical care time) Labs Review Results for orders placed during the hospital encounter of 09/18/13  CBC WITH DIFFERENTIAL      Result Value Ref Range   WBC 7.7  4.0 - 10.5 K/uL   RBC 3.68 (*) 4.22 - 5.81 MIL/uL   Hemoglobin 10.0 (*) 13.0 - 17.0 g/dL   HCT 16.130.3 (*) 09.639.0 - 04.552.0 %   MCV 82.3  78.0 - 100.0 fL   MCH 27.2  26.0 - 34.0 pg   MCHC 33.0  30.0 - 36.0 g/dL   RDW 40.913.5  81.111.5 - 91.415.5 %   Platelets 210  150 - 400 K/uL   Neutrophils Relative % 62  43 - 77 %   Neutro Abs 4.8  1.7 - 7.7 K/uL   Lymphocytes Relative 27  12 - 46 %   Lymphs Abs 2.1  0.7 - 4.0 K/uL   Monocytes Relative 6  3 - 12 %   Monocytes Absolute 0.4  0.1 - 1.0 K/uL   Eosinophils Relative 5  0 - 5 %   Eosinophils Absolute 0.4  0.0 - 0.7 K/uL   Basophils Relative 0  0  - 1 %   Basophils Absolute 0.0  0.0 - 0.1 K/uL  BASIC METABOLIC PANEL      Result Value Ref Range   Sodium 138  137 - 147 mEq/L   Potassium 4.3  3.7 - 5.3 mEq/L   Chloride 101  96 - 112 mEq/L   CO2 23  19 - 32 mEq/L  Glucose, Bld 138 (*) 70 - 99 mg/dL   BUN 42 (*) 6 - 23 mg/dL   Creatinine, Ser 2.13 (*) 0.50 - 1.35 mg/dL   Calcium 9.7  8.4 - 08.6 mg/dL   GFR calc non Af Amer 40 (*) >90 mL/min   GFR calc Af Amer 46 (*) >90 mL/min  MAGNESIUM      Result Value Ref Range   Magnesium 2.0  1.5 - 2.5 mg/dL  CBG MONITORING, ED      Result Value Ref Range   Glucose-Capillary 141 (*) 70 - 99 mg/dL  CBG MONITORING, ED      Result Value Ref Range   Glucose-Capillary 121 (*) 70 - 99 mg/dL    Date: 57/84/6962  Rate: 58  Rhythm: sinus bradycardia  QRS Axis: normal  Intervals: normal  ST/T Wave abnormalities: normal  Conduction Disutrbances:right bundle branch block  Narrative Interpretation: Borderline sinus bradycardia, right bundle branch block. When compared with ECG of 08/04/2013, heart rate has slowed slightly but no other changes are seen.  Old EKG Reviewed: unchanged  CRITICAL CARE Performed by: Dione Booze Total critical care time: 35 minutes Critical care time was exclusive of separately billable procedures and treating other patients. Critical care was necessary to treat or prevent imminent or life-threatening deterioration. Critical care was time spent personally by me on the following activities: development of treatment plan with patient and/or surrogate as well as nursing, discussions with consultants, evaluation of patient's response to treatment, examination of patient, obtaining history from patient or surrogate, ordering and performing treatments and interventions, ordering and review of laboratory studies, ordering and review of radiographic studies, pulse oximetry and re-evaluation of patient's condition.  MDM   Final diagnoses:  Accidental drug overdose  Renal  insufficiency    Accidental overdose of carvedilol. Given that this was taken in liquid form, the likelihood of any clinical benefit from activated charcoal is extremely small so activated charcoal was not given. Carvedilol has an elevation half-life of 7-10 hours, which means he will need to be admitted for observation for development of bradycardia. I discussed case with poison control who concur with this plan of action.  Heart rate continues to stay in the 55-65 range. Blood pressure has come down slightly but continues to be in the normal range. Laboratory workup shows mild renal insufficiency which is unchanged from baseline. Case is discussed with Dr. Toniann Fail [facility] who agrees to admit him under observation status.  Dione Booze, MD 09/19/13 [phone]

## 2013-09-19 NOTE — ED Notes (Signed)
MD at bedside. 

## 2013-09-19 NOTE — ED Notes (Signed)
Phlebotomy at BS

## 2013-09-19 NOTE — H&P (Signed)
[facility] History and Physical  Daniele Yankowski ZOX:[id number] DOB: 1955/08/02 DOA: 09/18/2013  Referring physician: ER physician. PCP: Leo Grosser, MD   Chief Complaint: Accidental overdose with Coreg.  HPI: Alexander Hubbard is a 58 y.o. male history of CHF, hypertension, OSA, gout, diabetes mellitus who has had a recent bariatric surgery at [facility] had an accidental overdose of beta blocker Coreg. Patient is presently on liquid form of Coreg and was supposed to take 5 mL and accidentally took 13 mL. The medication dose was confused with PPI. Patient with a dose around 9 PM following which patient had mild headache which has resolved. His wife after half an hour realized the mistake and brought the patient to the hospital. In the ER patient's heart rate is around 60-70 blood pressure more than 100 systolic. Patient at this time be admitted for observation. Patient denies any chest pain shortness of breath dizziness loss of consciousness. Denies any nausea vomiting abdominal pain diarrhea.   Review of Systems: As presented in the history of presenting illness, rest negative.  Past Medical History  Diagnosis Date  . Diabetes mellitus   . Hypertension   . Obesity   . Sleep apnea   . Gouty arthritis    Past Surgical History  Procedure Laterality Date  . Eye surgery    . Knee surgery    . Gastric bypass     Social History:  reports that he has never smoked. He has never used smokeless tobacco. He reports that he does not drink alcohol or use illicit drugs. Where does patient live home. Can patient participate in ADLs? Yes.  Allergies  Allergen Reactions  . Penicillins Swelling and Rash  . Amoxicillin Rash and Swelling  . Penicillin G Rash    Family History:  Family History  Problem Relation Age of Onset  . Hypertension Sister   . Diabetes Mellitus II Sister       Prior to Admission medications   Medication Sig Start Date End Date Taking? Authorizing Provider   ammonium lactate (LAC-HYDRIN) 12 % lotion Apply 1 application topically 2 (two) times daily as needed (legs). 08/30/13   Marlowe Aschoff, DPM  aspirin EC 81 MG tablet Take 81 mg by mouth daily.    Historical Provider, MD  BD ULTRA-FINE PEN NEEDLES 29G X 12.7MM MISC USE AS DIRECTED    Donita Brooks, MD  brinzolamide (AZOPT) 1 % ophthalmic suspension Place 1 drop into both eyes 2 (two) times daily.      Historical Provider, MD  calcium carbonate (OS-CAL) 1250 MG chewable tablet Chew 1 tablet by mouth daily.    Historical Provider, MD  carvedilol (COREG) 12.5 MG tablet Take 12.5 mg by mouth 2 (two) times daily with a meal.    Historical Provider, MD  colchicine 0.6 MG tablet TAKE 1 TABLET BY MOUTH EVERY DAY    Donita Brooks, MD  febuxostat (ULORIC) 40 MG tablet Take 40 mg by mouth daily.     Historical Provider, MD  gabapentin (NEURONTIN) 300 MG capsule Take 600 mg by mouth 3 (three) times daily.    Historical Provider, MD  glucose blood (ACCU-CHEK SMARTVIEW) test strip Use as instructed to check blood sugars 6 times per day dx code 250.62 06/06/13   Reather Littler, MD  glucose blood (CVS BLOOD GLUCOSE TEST STRIPS) test strip Use as instructed to check blood sugars 6 times per day dx code 250.62 06/06/13   Historical Provider, MD  Insulin Glargine 100 UNIT/ML SOPN Inject 15  units 2 times daily. 04/07/13   Reather LittlerAjay Kumar, MD  Insulin Syringe-Needle U-100 (B-D INS SYR ULTRAFINE .3CC/30G) 30G X 1/2" 0.3 ML MISC USE AS DIRECTED 3 TIMES A DAY. DX CODE: 250.00 07/06/13   Historical Provider, MD  latanoprost (XALATAN) 0.005 % ophthalmic solution Place 1 drop into the left eye at bedtime.     Historical Provider, MD  pantoprazole (PROTONIX) 40 MG tablet Take 40 mg by mouth 2 (two) times daily.    Historical Provider, MD  prednisoLONE acetate (PRED FORTE) 1 % ophthalmic suspension Place 1 drop into the right eye at bedtime.     Historical Provider, MD  rosuvastatin (CRESTOR) 20 MG tablet Take 20 mg by mouth daily.     Historical Provider, MD  timolol (TIMOPTIC) 0.5 % ophthalmic solution Place 1 drop into both eyes 2 (two) times daily.    Historical Provider, MD  vitamin B-12 (CYANOCOBALAMIN) 1000 MCG tablet Take 1,000 mcg by mouth. (Chewable tablet)    Historical Provider, MD    Physical Exam: Filed Vitals:   09/19/13 0130 09/19/13 0145 09/19/13 0200 09/19/13 0215  BP: 137/52 165/56 140/65 165/76  Pulse: 66 68 58 64  Temp:      TempSrc:      Resp: 12 18    Height:      Weight:      SpO2: 97% 94% 98% 98%     General:  Morbidly obese not in distress.  Eyes: Anicteric no pallor.  ENT: No discharge from the ears eyes nose mouth.  Neck: No mass felt.  Cardiovascular: S1-S2 heard.  Respiratory: No rhonchi or crepitations.  Abdomen: Surgical scar seen looks healed. Nontender bowel sounds present.  Skin: Surgical wounds looks healed on the abdomen.  Musculoskeletal: No edema.  Psychiatric: Appears normal.  Neurologic: Alert awake oriented to time place and person. Moves all extremities.  Labs on Admission:  Basic Metabolic Panel:  Recent Labs Lab 09/13/13 1452 09/19/13 0030  NA 138 138  K 4.4 4.3  CL 102 101  CO2 22 23  GLUCOSE 81 138*  BUN 38* 42*  CREATININE 1.73* 1.80*  CALCIUM 9.5 9.7  MG 1.9 2.0   Liver Function Tests:  Recent Labs Lab 09/13/13 1452  AST 24  ALT 20  ALKPHOS 65  BILITOT 0.4  PROT 6.3  ALBUMIN 3.3*   No results found for this basename: LIPASE, AMYLASE,  in the last 168 hours No results found for this basename: AMMONIA,  in the last 168 hours CBC:  Recent Labs Lab 09/13/13 1452 09/19/13 0030  WBC 9.5 7.7  NEUTROABS 6.1 4.8  HGB 10.0* 10.0*  HCT 30.4* 30.3*  MCV 80.6 82.3  PLT 265 210   Cardiac Enzymes: No results found for this basename: CKTOTAL, CKMB, CKMBINDEX, TROPONINI,  in the last 168 hours  BNP (last 3 results)  Recent Labs  09/22/12 0927  PROBNP 225.2*   CBG:  Recent Labs Lab 09/19/13 0007 09/19/13 0147  GLUCAP  141* 121*    Radiological Exams on Admission: No results found.  EKG: Independently reviewed. Normal sinus rhythm with RBBB. Rate of 58 beats per minute.  Assessment/Plan Principal Problem:   Accidental drug overdose Active Problems:   OSA (obstructive sleep apnea)   Diastolic CHF, chronic   HTN (hypertension)   Renal failure, chronic   1. Accidental overdose of Coreg - at this time patient is asymptomatic and we will observe patient. Hold off Coreg and patient's Timolol eyedrops. Closely monitor in telemetry. 2. CHF -  looks compensated. 3. Hypertension - holding off Coreg due to #1. 4. Diabetes mellitus type 2 - continue home medications with sliding-scale coverage. 5. History of gout - continue medications. 6. Chronic kidney disease - closely follow intake output and metabolic panel. 7. Chronic anemia - follow CBC. 8. OSA - CPAP per respiratory.    Code Status: Full code.  Family Communication: Patient's wife.  Disposition Plan: Admit for observation.    Eduard ClosArshad N Komal Stangelo [facility] Pager [phone].  If 7PM-7AM, please contact night-coverage [email or web address] Password [email or web address] 09/19/2013, 2:41 AM

## 2013-09-28 ENCOUNTER — Other Ambulatory Visit: Payer: Self-pay | Admitting: Family Medicine

## 2013-09-30 ENCOUNTER — Other Ambulatory Visit: Payer: Self-pay | Admitting: Family Medicine

## 2013-09-30 MED ORDER — ALLOPURINOL 100 MG PO TABS: 200.0000 mg | ORAL_TABLET | Freq: Every day | ORAL | Status: DC

## 2013-10-03 ENCOUNTER — Other Ambulatory Visit: Payer: Self-pay | Admitting: *Deleted

## 2013-10-03 ENCOUNTER — Inpatient Hospital Stay: Payer: Medicaid Other | Admitting: Family Medicine

## 2013-10-03 MED ORDER — INSULIN GLARGINE 100 UNIT/ML SOLOSTAR PEN: PEN_INJECTOR | SUBCUTANEOUS | Status: DC

## 2013-10-04 ENCOUNTER — Encounter: Payer: Self-pay | Admitting: Family Medicine

## 2013-10-04 ENCOUNTER — Ambulatory Visit (INDEPENDENT_AMBULATORY_CARE_PROVIDER_SITE_OTHER): Payer: Medicaid Other | Admitting: Family Medicine

## 2013-10-04 VITALS — BP 130/80 | HR 62 | Temp 97.1°F | Resp 16 | Ht 68.0 in | Wt 335.0 lb

## 2013-10-04 DIAGNOSIS — Z09 Encounter for follow-up examination after completed treatment for conditions other than malignant neoplasm: Secondary | ICD-10-CM

## 2013-10-04 NOTE — Progress Notes (Signed)
Subjective:    Patient ID: Alexander Hubbard, male    DOB: 1955/11/03, 58 y.o.   MRN: [id number]  HPI  Wt Readings from Last 3 Encounters:  10/04/13 335 lb (151.955 kg)  09/19/13 349 lb (158.305 kg)  09/13/13 357 lb (161.934 kg)   Patient was recently admitted to the hospital after an accidental overdose on carvedilol tried taking the wrong dose accident. He was monitored for 15 hours in the hospital. When his heart rate was stable he was doing fine he was discharged home. He had no further problems since discharge. He is currently taking 15 units twice a day Lantus. His fasting blood sugars are around 120. He denies any hypoglycemia. His blood pressure and heart rate are stable on his current medication regimen. He continues to lose weight after his gastric bypass surgery. His weight is down additional 18 pounds since his last office visit. We checked his lab work 2 weeks ago everything was fine. He denies any problems or medical complaints at this time. Past Medical History  Diagnosis Date  . Diabetes mellitus   . Hypertension   . Obesity   . Sleep apnea   . Gouty arthritis    Past Surgical History  Procedure Laterality Date  . Eye surgery    . Knee surgery    . Gastric bypass     Current Outpatient Prescriptions on File Prior to Visit  Medication Sig Dispense Refill  . allopurinol (ZYLOPRIM) 100 MG tablet Take 2 tablets (200 mg total) by mouth daily.  60 tablet  6  . ammonium lactate (LAC-HYDRIN) 12 % lotion Apply 1 application topically 2 (two) times daily as needed (legs).  400 g  2  . aspirin EC 81 MG tablet Take 81 mg by mouth daily.      . BD ULTRA-FINE PEN NEEDLES 29G X 12.7MM MISC USE AS DIRECTED  100 each  11  . brinzolamide (AZOPT) 1 % ophthalmic suspension Place 1 drop into both eyes 2 (two) times daily.        . calcium-vitamin D (OSCAL WITH D) 500-200 MG-UNIT per tablet Take 1 tablet by mouth 3 (three) times daily.      . carvedilol (COREG) 12.5 MG tablet Take 10 mg by  mouth 2 (two) times daily with a meal.       . colchicine 0.6 MG tablet TAKE 1 TABLET BY MOUTH EVERY DAY  30 tablet  5  . febuxostat (ULORIC) 40 MG tablet Take 40 mg by mouth daily.       Marland Kitchen. gabapentin (NEURONTIN) 300 MG capsule Take 600 mg by mouth 2 (two) times daily.       Marland Kitchen. gabapentin (NEURONTIN) 300 MG capsule TAKE 2 CAPSULES BY MOUTH 3 TIMES A DAY  180 capsule  3  . glucose blood (ACCU-CHEK SMARTVIEW) test strip Use as instructed to check blood sugars 6 times per day dx code 250.62  200 each  5  . glucose blood (CVS BLOOD GLUCOSE TEST STRIPS) test strip Use as instructed to check blood sugars 6 times per day dx code 250.62      . Insulin Glargine (LANTUS) 100 UNIT/ML Solostar Pen Inject 15 units 2 times daily.  15 mL  2  . Insulin Syringe-Needle U-100 (B-D INS SYR ULTRAFINE .3CC/30G) 30G X 1/2" 0.3 ML MISC USE AS DIRECTED 3 TIMES A DAY. DX CODE: 250.00      . latanoprost (XALATAN) 0.005 % ophthalmic solution Place 1 drop into the left eye at  bedtime.       . Multiple Vitamin (MULTIVITAMIN) capsule Take 1 capsule by mouth 2 (two) times daily.      . pantoprazole (PROTONIX) 40 MG tablet Take 40 mg by mouth 2 (two) times daily.      . prednisoLONE acetate (PRED FORTE) 1 % ophthalmic suspension Place 1 drop into the right eye at bedtime.       . rosuvastatin (CRESTOR) 20 MG tablet Take 20 mg by mouth daily.      . timolol (TIMOPTIC) 0.5 % ophthalmic solution Place 1 drop into both eyes 2 (two) times daily.      Marland Kitchen. ULORIC 40 MG tablet TAKE 1 TABLET BY MOUTH EVERY DAY  30 tablet  5  . vitamin B-12 (CYANOCOBALAMIN) 1000 MCG tablet Take 1,000 mcg by mouth. (Chewable tablet)       No current facility-administered medications on file prior to visit.   Allergies  Allergen Reactions  . Penicillins Swelling and Rash  . Amoxicillin Rash and Swelling  . Penicillin G Rash   History   Social History  . Marital Status: Married    Spouse Name: N/A    Number of Children: N/A  . Years of Education:  N/A   Occupational History  . Not on file.   Social History Main Topics  . Smoking status: Never Smoker   . Smokeless tobacco: Never Used  . Alcohol Use: No  . Drug Use: No  . Sexual Activity: Not on file   Other Topics Concern  . Not on file   Social History Narrative  . No narrative on file     Review of Systems  All other systems reviewed and are negative.      Objective:   Physical Exam  Vitals reviewed. Constitutional: He appears well-developed and well-nourished. No distress.  HENT:  Mouth/Throat: No oropharyngeal exudate.  Eyes: Conjunctivae are normal. No scleral icterus.  Neck: Neck supple. No JVD present. No thyromegaly present.  Cardiovascular: Normal rate, regular rhythm and normal heart sounds.   No murmur heard. Pulmonary/Chest: Effort normal and breath sounds normal. No respiratory distress. He has no wheezes. He has no rales.  Abdominal: Soft. Bowel sounds are normal. He exhibits no distension. There is no tenderness. There is no rebound and no guarding.  Musculoskeletal: He exhibits no edema.  Skin: He is not diaphoretic.          Assessment & Plan:  1. Hospital discharge follow-up Patient is doing fine after his most recent hospital admission.  There are no specific issues today for followup. It was an accidental overdose with no dominant side effects. The patient continues to do well after his gastric bypass. Is not yet at a point where we can begin tapering down his medication, but I expect that we will gradually start to need to wean off his insulin over the next one to 2 months. I recommended he return in one month for recheck along with a CBC and a CMP.

## 2013-10-07 ENCOUNTER — Other Ambulatory Visit: Payer: Medicaid Other

## 2013-10-10 ENCOUNTER — Encounter (HOSPITAL_BASED_OUTPATIENT_CLINIC_OR_DEPARTMENT_OTHER): Payer: Medicaid Other | Attending: Plastic Surgery

## 2013-10-10 ENCOUNTER — Telehealth: Payer: Self-pay | Admitting: Family Medicine

## 2013-10-10 NOTE — Telephone Encounter (Signed)
Prior Auth completed for Crestor thru [employer].  Approved for 1 year. Conf# [id number]  PA# [id number]

## 2013-10-11 ENCOUNTER — Other Ambulatory Visit (INDEPENDENT_AMBULATORY_CARE_PROVIDER_SITE_OTHER): Payer: Medicaid Other

## 2013-10-11 DIAGNOSIS — E1149 Type 2 diabetes mellitus with other diabetic neurological complication: Secondary | ICD-10-CM

## 2013-10-11 DIAGNOSIS — E782 Mixed hyperlipidemia: Secondary | ICD-10-CM

## 2013-10-11 LAB — COMPREHENSIVE METABOLIC PANEL
ALT: 21 U/L (ref 0–53)
AST: 23 U/L (ref 0–37)
Albumin: 3.5 g/dL (ref 3.5–5.2)
Alkaline Phosphatase: 68 U/L (ref 39–117)
BUN: 31 mg/dL — AB (ref 6–23)
CALCIUM: 9.7 mg/dL (ref 8.4–10.5)
CHLORIDE: 105 meq/L (ref 96–112)
CO2: 24 meq/L (ref 19–32)
CREATININE: 1.3 mg/dL (ref 0.4–1.5)
GFR: 58.73 mL/min — AB (ref >60.00)
GLUCOSE: 104 mg/dL — AB (ref 70–99)
Potassium: 3.8 mEq/L (ref 3.5–5.1)
Sodium: 137 mEq/L (ref 135–145)
Total Bilirubin: 0.6 mg/dL (ref 0.2–1.2)
Total Protein: 6.8 g/dL (ref 6.0–8.3)

## 2013-10-11 LAB — LIPID PANEL
CHOLESTEROL: 93 mg/dL (ref 0–200)
HDL: 29.1 mg/dL — AB (ref >39.00)
LDL Cholesterol: 27 mg/dL (ref 0–99)
TRIGLYCERIDES: 183 mg/dL — AB (ref 0.0–149.0)
Total CHOL/HDL Ratio: 3
VLDL: 36.6 mg/dL (ref 0.0–40.0)

## 2013-10-11 LAB — HEMOGLOBIN A1C: Hgb A1c MFr Bld: 6 % (ref 4.6–6.5)

## 2013-10-13 ENCOUNTER — Ambulatory Visit (INDEPENDENT_AMBULATORY_CARE_PROVIDER_SITE_OTHER): Payer: Medicaid Other | Admitting: Endocrinology

## 2013-10-13 ENCOUNTER — Encounter: Payer: Self-pay | Admitting: Endocrinology

## 2013-10-13 VITALS — BP 124/82 | HR 68 | Temp 98.3°F | Resp 16 | Ht 68.0 in | Wt 339.8 lb

## 2013-10-13 DIAGNOSIS — E1149 Type 2 diabetes mellitus with other diabetic neurological complication: Secondary | ICD-10-CM

## 2013-10-13 NOTE — Progress Notes (Signed)
Patient ID: Alexander Hubbard, male   DOB: [birth year], 58 y.o.   MRN: [id number]   Reason for Appointment: Diabetes follow-up   History of Present Illness   Diagnosis: Type 2 DIABETES MELITUS, insulin requiring     PAST history: He has been on relatively large doses of insulin for a few years and was not well controlled until he started the U-500 insulin This particularly help his fasting hyperglycemia which was difficult to control. He only benefited partially from Victoza and this did not help him with weight loss significantly A1c had increased in 8/14  Insulin regimen: LANTUS 15-18 twice a day   RECENT history:  He had bariatric surgery in early April 2015 Since then he has been able to stop his mealtime insulin and also has had significant reduction of his Lantus insulin However despite continued weight loss he still appears to be needing Lantus insulin He does not think his blood sugars are high even after meals or his protein shake from the weight loss Center Is continuing  to lose a significant amount of weight and is using small portions of soft foods  Monitors blood glucose:  3 times a day   Glucometer: Accu-Chek        Blood Glucose readings from recall FASTING 108-123; highest 147 nonfasting Hypoglycemia:  none            Physical activity: exercise: Is able to walk a little more  recently            Wt Readings from Last 3 Encounters:  10/13/13 339 lb 12.8 oz (154.132 kg)  10/04/13 335 lb (151.955 kg)  09/19/13 349 lb (158.305 kg)   Diabetes labs:  Lab Results  Component Value Date   HGBA1C 6.0 10/11/2013   HGBA1C 7.2* 07/29/2013   HGBA1C 7.3* 04/07/2013   Lab Results  Component Value Date   MICROALBUR 83.1* 01/03/2013   LDLCALC 27 10/11/2013   CREATININE 1.3 10/11/2013    PROBLEM 2: He is asking about his anemia and feeling excessively cold. He also gets somewhat out of breath on exertion He has not been told to start iron. Review of his recent labs show that he is  iron deficient and hemoglobin is below 10. B12 is normal. Not clear of the etiology of his iron deficiency     Medication List       This list is accurate as of: 10/13/13 11:20 AM.  Always use your most recent med list.               allopurinol 100 MG tablet  Commonly known as:  ZYLOPRIM  Take 2 tablets (200 mg total) by mouth daily.     ammonium lactate 12 % lotion  Commonly known as:  LAC-HYDRIN  Apply 1 application topically 2 (two) times daily as needed (legs).     aspirin EC 81 MG tablet  Take 81 mg by mouth daily.     B-D INS SYR ULTRAFINE .3CC/30G 30G X 1/2" 0.3 ML Misc  Generic drug:  Insulin Syringe-Needle U-100  USE AS DIRECTED 3 TIMES A DAY. DX CODE: 250.00     BD ULTRA-FINE PEN NEEDLES 29G X 12.7MM Misc  Generic drug:  Insulin Pen Needle  USE AS DIRECTED     brinzolamide 1 % ophthalmic suspension  Commonly known as:  AZOPT  Place 1 drop into both eyes 2 (two) times daily.     calcium-vitamin D 500-200 MG-UNIT per tablet  Commonly known as:  OSCAL  WITH D  Take 1 tablet by mouth 3 (three) times daily.     carvedilol 12.5 MG tablet  Commonly known as:  COREG  Take 10 mg by mouth 2 (two) times daily with a meal.     colchicine 0.6 MG tablet  TAKE 1 TABLET BY MOUTH EVERY DAY     ULORIC 40 MG tablet  Generic drug:  febuxostat  TAKE 1 TABLET BY MOUTH EVERY DAY     febuxostat 40 MG tablet  Commonly known as:  ULORIC  Take 40 mg by mouth daily.     gabapentin 300 MG capsule  Commonly known as:  NEURONTIN  Take 600 mg by mouth 2 (two) times daily.     gabapentin 300 MG capsule  Commonly known as:  NEURONTIN  TAKE 2 CAPSULES BY MOUTH 3 TIMES A DAY     glucose blood test strip  Commonly known as:  ACCU-CHEK SMARTVIEW  Use as instructed to check blood sugars 6 times per day dx code 250.62     CVS BLOOD GLUCOSE TEST STRIPS test strip  Generic drug:  glucose blood  Use as instructed to check blood sugars 6 times per day dx code 250.62     Insulin  Glargine 100 UNIT/ML Solostar Pen  Commonly known as:  LANTUS  Inject 15 units 2 times daily.     latanoprost 0.005 % ophthalmic solution  Commonly known as:  XALATAN  Place 1 drop into the left eye at bedtime.     multivitamin capsule  Take 1 capsule by mouth 2 (two) times daily.     pantoprazole 40 MG tablet  Commonly known as:  PROTONIX  Take 40 mg by mouth 2 (two) times daily.     pantoprazole sodium 40 mg/20 mL Pack  Commonly known as:  PROTONIX  Take 40 mg by mouth daily.     prednisoLONE acetate 1 % ophthalmic suspension  Commonly known as:  PRED FORTE  Place 1 drop into the right eye at bedtime.     rosuvastatin 20 MG tablet  Commonly known as:  CRESTOR  Take 20 mg by mouth daily.     timolol 0.5 % ophthalmic solution  Commonly known as:  TIMOPTIC  Place 1 drop into both eyes 2 (two) times daily.     vitamin B-12 1000 MCG tablet  Commonly known as:  CYANOCOBALAMIN  Take 1,000 mcg by mouth. (Chewable tablet)        Allergies:  Allergies  Allergen Reactions  . Penicillins Swelling and Rash  . Amoxicillin Rash and Swelling  . Penicillin G Rash    Past Medical History  Diagnosis Date  . Diabetes mellitus   . Hypertension   . Obesity   . Sleep apnea   . Gouty arthritis     Past Surgical History  Procedure Laterality Date  . Eye surgery    . Knee surgery    . Gastric bypass      Family History  Problem Relation Age of Onset  . Hypertension Sister   . Diabetes Mellitus II Sister     Social History:  reports that he has never smoked. He has never used smokeless tobacco. He reports that he does not drink alcohol or use illicit drugs.  Review of Systems:  HYPERTENSION:  currently on Coreg  currently being treated by nephrologist, home reading 140/66. However he was told to get liquid formulation and has not started this yet  HYPERLIPIDEMIA: The lipid abnormality consists of elevated  triglycerides. The niacin was stopped by  bariatric surgeon. He  is taking Crestor for cardiovascular prophylaxis  Lab Results  Component Value Date   CHOL 93 10/11/2013   HDL 29.10* 10/11/2013   LDLCALC 27 10/11/2013   TRIG 183.0* 10/11/2013   CHOLHDL 3 10/11/2013   He has history of neuropathy  He does have history of nephropathy with moderate renal dysfunction and creatinine was 1.8 on the last measurement at discharge     Examination:   BP 124/82  Pulse 68  Temp(Src) 98.3 F (36.8 C)  Resp 16  Ht 5\' 8"  (1.727 m)  Wt 339 lb 12.8 oz (154.132 kg)  BMI 51.68 kg/m2  SpO2 97%  Body mass index is 51.68 kg/(m^2).   ASSESSMENT/ PLAN:   Diabetes type 2   His blood sugars appear to be fairly good now and overall he is using only small amount of insulin However since his blood sugars are still over 100 Will still need to continue his basal insulin He will continue the same regimen but also consider switching to an oral agent like Januvia   HYPERTENSION: Better controlled now, taking  Coreg   Chronic renal disease: His creatinine and surprisingly better at 1.3, was 1.6 last month  Hyperlipidemia: Since LDL is only 27 he can leave off Crestor  Alexander Hubbard 10/13/2013, 11:20 AM    Appointment on 10/11/2013  Component Date Value Ref Range Status  . Hemoglobin A1C 10/11/2013 6.0  4.6 - 6.5 % Final   Glycemic Control Guidelines for People with Diabetes:Non Diabetic:  <6%Goal of Therapy: <7%Additional Action Suggested:  >8%   . Sodium 10/11/2013 137  135 - 145 mEq/L Final  . Potassium 10/11/2013 3.8  3.5 - 5.1 mEq/L Final  . Chloride 10/11/2013 105  96 - 112 mEq/L Final  . CO2 10/11/2013 24  19 - 32 mEq/L Final  . Glucose, Bld 10/11/2013 104* 70 - 99 mg/dL Final  . BUN 54/09/811905/05/2014 31* 6 - 23 mg/dL Final  . Creatinine, Ser 10/11/2013 1.3  0.4 - 1.5 mg/dL Final  . Total Bilirubin 10/11/2013 0.6  0.2 - 1.2 mg/dL Final  . Alkaline Phosphatase 10/11/2013 68  39 - 117 U/L Final  . AST 10/11/2013 23  0 - 37 U/L Final  . ALT 10/11/2013 21  0 - 53 U/L  Final  . Total Protein 10/11/2013 6.8  6.0 - 8.3 g/dL Final  . Albumin 14/78/295605/05/2014 3.5  3.5 - 5.2 g/dL Final  . Calcium 21/30/865705/05/2014 9.7  8.4 - 10.5 mg/dL Final  . GFR 84/69/629505/05/2014 58.73* >60.00 mL/min Final  . Cholesterol 10/11/2013 93  0 - 200 mg/dL Final   ATP III Classification       Desirable:  < 200 mg/dL               Borderline High:  200 - 239 mg/dL          High:  > = 284240 mg/dL  . Triglycerides 10/11/2013 183.0* 0.0 - 149.0 mg/dL Final   Normal:  <132<150 mg/dLBorderline High:  150 - 199 mg/dL  . HDL 10/11/2013 29.10* >39.00 mg/dL Final  . VLDL 44/01/027205/05/2014 36.6  0.0 - 40.0 mg/dL Final  . LDL Cholesterol 10/11/2013 27  0 - 99 mg/dL Final  . Total CHOL/HDL Ratio 10/11/2013 3   Final                  Men          Women1/2 Average Risk  3.4          3.3Average Risk          5.0          4.42X Average Risk          9.6          7.13X Average Risk          15.0          11.0

## 2013-10-13 NOTE — Patient Instructions (Addendum)
Take Gabapentin only as needed  Stop Crestor  Reduce Lantus when sugars stay <100

## 2013-10-14 ENCOUNTER — Ambulatory Visit: Payer: Medicaid Other | Admitting: Family Medicine

## 2013-10-14 ENCOUNTER — Other Ambulatory Visit: Payer: Self-pay | Admitting: Family Medicine

## 2013-10-27 ENCOUNTER — Ambulatory Visit: Payer: Medicaid Other | Admitting: Podiatrist

## 2013-10-27 ENCOUNTER — Ambulatory Visit (INDEPENDENT_AMBULATORY_CARE_PROVIDER_SITE_OTHER): Payer: Medicaid Other | Admitting: Family Medicine

## 2013-10-27 ENCOUNTER — Encounter: Payer: Self-pay | Admitting: Family Medicine

## 2013-10-27 VITALS — BP 120/80 | HR 62 | Temp 98.5°F | Resp 20 | Ht 68.0 in | Wt 331.0 lb

## 2013-10-27 DIAGNOSIS — IMO0001 Reserved for inherently not codable concepts without codable children: Secondary | ICD-10-CM

## 2013-10-27 MED ORDER — CYCLOBENZAPRINE HCL 5 MG PO TABS: 5.0000 mg | ORAL_TABLET | Freq: Three times a day (TID) | ORAL | Status: DC | PRN

## 2013-10-27 MED ORDER — FIRST-LANSOPRAZOLE 3 MG/ML PO SUSP: 13.3000 mL | Freq: Every day | ORAL | Status: DC

## 2013-10-27 NOTE — Progress Notes (Signed)
Subjective:    Patient ID: Alexander BouquetRiyad Basden, male    DOB: September 04, 1955, 58 y.o.   MRN: [id number]  HPI  Patient reports one week of diffuse muscle pains. It is primarily in his upper shoulders and lower back. It began in his right upper shoulder. It then moved to his left upper shoulder. That has subsequently resolved. He is now reporting pain and stiffness in his right lower back and left lower back. He denies any numbness or tingling in his arms. He denies any numbness or tingling in his legs. He denies any hip pain. Last week he began having some groin pain in his left inguinal canal. This resolved spontaneously on its own.  He was taking Crestor. He is also been engaging in more daily exercise. He is walking on a daily basis with his wife. The muscle pains began after he started exercising. He denies any fevers. He denies any rash. He denies any tick bite. Past Medical History  Diagnosis Date  . Diabetes mellitus   . Hypertension   . Obesity   . Sleep apnea   . Gouty arthritis    Current Outpatient Prescriptions on File Prior to Visit  Medication Sig Dispense Refill  . allopurinol (ZYLOPRIM) 100 MG tablet Take 2 tablets (200 mg total) by mouth daily.  60 tablet  6  . ammonium lactate (LAC-HYDRIN) 12 % lotion Apply 1 application topically 2 (two) times daily as needed (legs).  400 g  2  . aspirin EC 81 MG tablet Take 81 mg by mouth daily.      . BD ULTRA-FINE PEN NEEDLES 29G X 12.7MM MISC USE AS DIRECTED  100 each  11  . brinzolamide (AZOPT) 1 % ophthalmic suspension Place 1 drop into both eyes 2 (two) times daily.        . calcium-vitamin D (OSCAL WITH D) 500-200 MG-UNIT per tablet Take 1 tablet by mouth 3 (three) times daily.      . carvedilol (COREG) 12.5 MG tablet Take 10 mg by mouth 2 (two) times daily with a meal.       . colchicine 0.6 MG tablet TAKE 1 TABLET BY MOUTH EVERY DAY  30 tablet  5  . febuxostat (ULORIC) 40 MG tablet Take 40 mg by mouth daily.       Marland Kitchen. gabapentin (NEURONTIN)  300 MG capsule Take 600 mg by mouth 2 (two) times daily.       Marland Kitchen. gabapentin (NEURONTIN) 300 MG capsule TAKE 2 CAPSULES BY MOUTH 3 TIMES A DAY  180 capsule  3  . glucose blood (ACCU-CHEK SMARTVIEW) test strip Use as instructed to check blood sugars 6 times per day dx code 250.62  200 each  5  . glucose blood ([facility] BLOOD GLUCOSE TEST STRIPS) test strip Use as instructed to check blood sugars 6 times per day dx code 250.62      . Insulin Glargine (LANTUS) 100 UNIT/ML Solostar Pen Inject 15 units 2 times daily.  15 mL  2  . Insulin Syringe-Needle U-100 (B-D INS SYR ULTRAFINE .3CC/30G) 30G X 1/2" 0.3 ML MISC USE AS DIRECTED 3 TIMES A DAY. DX CODE: 250.00      . latanoprost (XALATAN) 0.005 % ophthalmic solution Place 1 drop into the left eye at bedtime.       . Multiple Vitamin (MULTIVITAMIN) capsule Take 1 capsule by mouth 2 (two) times daily.      . pantoprazole (PROTONIX) 40 MG tablet Take 40 mg by mouth 2 (two)  times daily.      . pantoprazole sodium (PROTONIX) 40 mg/20 mL PACK Take 40 mg by mouth daily.      . prednisoLONE acetate (PRED FORTE) 1 % ophthalmic suspension Place 1 drop into the right eye at bedtime.       . rosuvastatin (CRESTOR) 20 MG tablet Take 20 mg by mouth daily.      . timolol (TIMOPTIC) 0.5 % ophthalmic solution Place 1 drop into both eyes 2 (two) times daily.      Marland Kitchen ULORIC 40 MG tablet TAKE 1 TABLET BY MOUTH EVERY DAY  30 tablet  5  . vitamin B-12 (CYANOCOBALAMIN) 1000 MCG tablet Take 1,000 mcg by mouth. (Chewable tablet)       No current facility-administered medications on file prior to visit.   Allergies  Allergen Reactions  . Penicillins Swelling and Rash  . Amoxicillin Rash and Swelling  . Penicillin G Rash   History   Social History  . Marital Status: Married    Spouse Name: N/A    Number of Children: N/A  . Years of Education: N/A   Occupational History  . Not on file.   Social History Main Topics  . Smoking status: Never Smoker   . Smokeless tobacco:  Never Used  . Alcohol Use: No  . Drug Use: No  . Sexual Activity: Not on file   Other Topics Concern  . Not on file   Social History Narrative  . No narrative on file     Review of Systems  All other systems reviewed and are negative.      Objective:   Physical Exam  Vitals reviewed. Cardiovascular: Normal rate and regular rhythm.   Pulmonary/Chest: Effort normal and breath sounds normal.  Abdominal: Soft. Bowel sounds are normal.  Musculoskeletal:       Lumbar back: He exhibits decreased range of motion, tenderness, pain and spasm. He exhibits no bony tenderness.  Skin: Skin is warm. No rash noted. No erythema. No pallor.          Assessment & Plan:  1. Polymyalgias I believe the patient is experiencing diffuse muscle strains due to his increased exercise. I recommended Flexeril 5 mg every 8 hours as needed for muscle pain. I recommended moist heat 3 times a day as needed for muscle pain. I anticipate that as his body adjusts to his increasing physical activity, the muscle pains will improve.  I have also asked him to temporarily discontinue the Crestor to see if this could be contributing to his myalgias.  I do not believe this is a sign of PMR or another autoimmune disease. The absence of rash or fever makes tick-borne illness unlikely.  He also has no symptoms of a viral  illness. - cyclobenzaprine (FLEXERIL) 5 MG tablet; Take 1 tablet (5 mg total) by mouth 3 (three) times daily as needed for muscle spasms.  Dispense: 30 tablet; Refill: 1

## 2013-11-04 ENCOUNTER — Ambulatory Visit: Payer: Medicaid Other | Admitting: Family Medicine

## 2013-11-22 ENCOUNTER — Ambulatory Visit: Payer: Medicaid Other | Admitting: Vascular Surgery

## 2013-11-25 ENCOUNTER — Ambulatory Visit (INDEPENDENT_AMBULATORY_CARE_PROVIDER_SITE_OTHER): Payer: Medicaid Other | Admitting: Podiatrist

## 2013-11-25 DIAGNOSIS — M79673 Pain in unspecified foot: Secondary | ICD-10-CM

## 2013-11-25 DIAGNOSIS — B351 Tinea unguium: Secondary | ICD-10-CM

## 2013-11-25 DIAGNOSIS — M79609 Pain in unspecified limb: Secondary | ICD-10-CM

## 2013-11-25 MED ORDER — PETROLATUM-ZINC OXIDE 49-15 % EX OINT: 1.0000 "application " | TOPICAL_OINTMENT | Freq: Two times a day (BID) | CUTANEOUS | Status: DC

## 2013-11-28 ENCOUNTER — Telehealth: Payer: Self-pay | Admitting: *Deleted

## 2013-11-28 NOTE — Telephone Encounter (Signed)
I called and left the patient a message that his insurance will not cover the prescription for Zinc Oxide that was sent to [facility].  I informed him he can purchase it over the counter.  Call if you have any questions.

## 2013-11-28 NOTE — Telephone Encounter (Signed)
Got prescription electronically for Zinc Oxide ointment.  Medicaid will not pay for this because there's nothing commercially available to make this.  There is one that is over the counter.  So that is what we advise, use the over the counter.

## 2013-11-28 NOTE — Telephone Encounter (Signed)
The patient is the one who requested I call in that specific medication-- I think he got it before from another state he used to live.  If he calls, just let him know there is no coverage for that medication in [place] and there is no substitute.  He will have to purchase the cream himself otc. Thanks!

## 2013-11-28 NOTE — Progress Notes (Signed)
Subjective: Patient presents today with his wife for foot and nail care. The patient relatesthe ulcers on the tops of the toes on the left foot have all healed.  He also recently underwent weight loss surgery and has lost 90 pounds so far.  Patient also has painful thickened toenails of which he would like debrided at today's visit as well.   Objective: Neurovascular status is unchanged with neuropathy present and faintly palpable pedal pulses bilateral. The patient has absent hair growth and capillary refill time which is decreased bilateral. Patient's toenails are elongated, thickened and dystrophic.   Assessment: symptomatic mycotic nails   Plan: toenails debrided today without complication. He will be seen back in 3 months or as needed.

## 2013-12-08 ENCOUNTER — Other Ambulatory Visit: Payer: Medicaid Other

## 2013-12-13 ENCOUNTER — Ambulatory Visit: Payer: Medicaid Other | Admitting: Endocrinology

## 2013-12-14 ENCOUNTER — Other Ambulatory Visit: Payer: Self-pay | Admitting: *Deleted

## 2013-12-14 MED ORDER — GLUCOSE BLOOD VI STRP: ORAL_STRIP | Status: DC

## 2013-12-15 ENCOUNTER — Other Ambulatory Visit: Payer: Self-pay | Admitting: Family Medicine

## 2013-12-15 ENCOUNTER — Encounter: Payer: Self-pay | Admitting: Family Medicine

## 2013-12-15 MED ORDER — INSULIN PEN NEEDLE 29G X 12.7MM MISC: Status: DC

## 2013-12-26 ENCOUNTER — Other Ambulatory Visit (INDEPENDENT_AMBULATORY_CARE_PROVIDER_SITE_OTHER): Payer: Medicaid Other

## 2013-12-26 ENCOUNTER — Encounter: Payer: Self-pay | Admitting: Family Medicine

## 2013-12-26 ENCOUNTER — Ambulatory Visit (INDEPENDENT_AMBULATORY_CARE_PROVIDER_SITE_OTHER): Payer: Medicaid Other | Admitting: Family Medicine

## 2013-12-26 VITALS — BP 130/70 | HR 74 | Temp 98.3°F | Resp 18 | Ht 68.0 in | Wt 321.0 lb

## 2013-12-26 DIAGNOSIS — Z0000 Encounter for general adult medical examination without abnormal findings: Secondary | ICD-10-CM

## 2013-12-26 DIAGNOSIS — E1149 Type 2 diabetes mellitus with other diabetic neurological complication: Secondary | ICD-10-CM

## 2013-12-26 LAB — COMPREHENSIVE METABOLIC PANEL WITH GFR
ALT: 19 U/L (ref 0–53)
AST: 18 U/L (ref 0–37)
Albumin: 3.4 g/dL — ABNORMAL LOW (ref 3.5–5.2)
Alkaline Phosphatase: 59 U/L (ref 39–117)
BUN: 49 mg/dL — ABNORMAL HIGH (ref 6–23)
CO2: 26 meq/L (ref 19–32)
Calcium: 9.7 mg/dL (ref 8.4–10.5)
Chloride: 106 meq/L (ref 96–112)
Creatinine, Ser: 1.4 mg/dL (ref 0.4–1.5)
GFR: 55.32 mL/min — ABNORMAL LOW
Glucose, Bld: 128 mg/dL — ABNORMAL HIGH (ref 70–99)
Potassium: 4.1 meq/L (ref 3.5–5.1)
Sodium: 139 meq/L (ref 135–145)
Total Bilirubin: 0.4 mg/dL (ref 0.2–1.2)
Total Protein: 6.8 g/dL (ref 6.0–8.3)

## 2013-12-26 LAB — HEMOGLOBIN A1C: Hgb A1c MFr Bld: 6.4 % (ref 4.6–6.5)

## 2013-12-26 NOTE — Progress Notes (Signed)
Subjective:    Patient ID: Alexander Hubbard, male    DOB: [birth year], 58 y.o.   MRN: [id number]  HPI Today for complete physical exam. He continues to lose weight after his gastric bypass. He is 10 pounds less than when I saw him in May. His decrease his insulin to 10 units twice a day. His hemoglobin A1c was checked this morning was found to be excellent at 6.4. His renal function was also checked and his creatinine is stable at 1.3. His liver tests are normal. He sees his Careers adviser at [facility] from his gastric bypass. His surgeon is monitoring his iron and magnesium and b12 levels.  He is overdue for a prostate exam. Patient states that [place]. At that time it was normal. His tetanus vaccine is up-to-date per his report.  Patient had Prevnar 13 less than one year ago. He is not due for a repeat Pneumovax 23 until age 54. His most recent lab work is listed below: Appointment on 12/26/2013  Component Date Value Ref Range Status  . Hemoglobin A1C 12/26/2013 6.4  4.6 - 6.5 % Final   Glycemic Control Guidelines for People with Diabetes:Non Diabetic:  <6%Goal of Therapy: <7%Additional Action Suggested:  >8%   . Sodium 12/26/2013 139  135 - 145 mEq/L Final  . Potassium 12/26/2013 4.1  3.5 - 5.1 mEq/L Final  . Chloride 12/26/2013 106  96 - 112 mEq/L Final  . CO2 12/26/2013 26  19 - 32 mEq/L Final  . Glucose, Bld 12/26/2013 128* 70 - 99 mg/dL Final  . BUN 40/98/1191 49* 6 - 23 mg/dL Final  . Creatinine, Ser 12/26/2013 1.4  0.4 - 1.5 mg/dL Final  . Total Bilirubin 12/26/2013 0.4  0.2 - 1.2 mg/dL Final  . Alkaline Phosphatase 12/26/2013 59  39 - 117 U/L Final  . AST 12/26/2013 18  0 - 37 U/L Final  . ALT 12/26/2013 19  0 - 53 U/L Final  . Total Protein 12/26/2013 6.8  6.0 - 8.3 g/dL Final  . Albumin 47/82/9562 3.4* 3.5 - 5.2 g/dL Final  . Calcium 13/12/6576 9.7  8.4 - 10.5 mg/dL Final  . GFR 46/96/2952 55.32* >60.00 mL/min Final   Past Medical History    Diagnosis Date  . Diabetes mellitus   . Hypertension   . Obesity   . Sleep apnea   . Gouty arthritis    Past Surgical History  Procedure Laterality Date  . Eye surgery    . Knee surgery    . Gastric bypass     Current Outpatient Prescriptions on File Prior to Visit  Medication Sig Dispense Refill  . allopurinol (ZYLOPRIM) 100 MG tablet Take 2 tablets (200 mg total) by mouth daily.  60 tablet  6  . ammonium lactate (LAC-HYDRIN) 12 % lotion Apply 1 application topically 2 (two) times daily as needed (legs).  400 g  2  . aspirin EC 81 MG tablet Take 81 mg by mouth daily.      . brinzolamide (AZOPT) 1 % ophthalmic suspension Place 1 drop into both eyes 2 (two) times daily.        . calcium-vitamin D (OSCAL WITH D) 500-200 MG-UNIT per tablet Take 1 tablet by mouth 3 (three) times daily.      . carvedilol (COREG) 12.5 MG tablet Take 10 mg by mouth 2 (two) times daily with a meal.       . colchicine 0.6 MG tablet TAKE 1 TABLET  BY MOUTH EVERY DAY  30 tablet  5  . febuxostat (ULORIC) 40 MG tablet Take 40 mg by mouth daily.       Marland Kitchen FIRST-LANSOPRAZOLE 3 MG/ML SUSP Take 13.3 mLs by mouth daily.  300 mL  5  . glucose blood (ACCU-CHEK SMARTVIEW) test strip Use as instructed to check blood sugars 6 times per day dx code 250.62  200 each  5  . Insulin Pen Needle (BD ULTRA-FINE PEN NEEDLES) 29G X 12.7MM MISC Use with insulin BID Dx - 250.00  100 each  11  . Insulin Syringe-Needle U-100 (B-D INS SYR ULTRAFINE .3CC/30G) 30G X 1/2" 0.3 ML MISC USE AS DIRECTED 3 TIMES A DAY. DX CODE: 250.00      . latanoprost (XALATAN) 0.005 % ophthalmic solution Place 1 drop into the left eye at bedtime.       . Multiple Vitamin (MULTIVITAMIN) capsule Take 1 capsule by mouth 2 (two) times daily.      Marland Kitchen Petrolatum-Zinc Oxide 49-15 % OINT Apply 1 application topically 2 (two) times daily.  1 Tube  5  . prednisoLONE acetate (PRED FORTE) 1 % ophthalmic suspension Place 1 drop into the right eye at bedtime.       Marland Kitchen  PRESCRIPTION MEDICATION Hydrocodone-Acetamin 7.5 - 325/78ml  10-20 mls q 4 hours      . timolol (TIMOPTIC) 0.5 % ophthalmic solution Place 1 drop into both eyes 2 (two) times daily.      Marland Kitchen ULORIC 40 MG tablet TAKE 1 TABLET BY MOUTH EVERY DAY  30 tablet  5  . vitamin B-12 (CYANOCOBALAMIN) 1000 MCG tablet Take 1,000 mcg by mouth. (Chewable tablet)      . rosuvastatin (CRESTOR) 20 MG tablet Take 20 mg by mouth daily.       No current facility-administered medications on file prior to visit.   Allergies  Allergen Reactions  . Penicillins Swelling and Rash  . Amoxicillin Rash and Swelling  . Penicillin G Rash   History   Social History  . Marital Status: Married    Spouse Name: N/A    Number of Children: N/A  . Years of Education: N/A   Occupational History  . Not on file.   Social History Main Topics  . Smoking status: Never Smoker   . Smokeless tobacco: Never Used  . Alcohol Use: No  . Drug Use: No  . Sexual Activity: Not on file   Other Topics Concern  . Not on file   Social History Narrative  . No narrative on file   Family History  Problem Relation Age of Onset  . Hypertension Sister   . Diabetes Mellitus II Sister       Review of Systems  All other systems reviewed and are negative.      Objective:   Physical Exam  Vitals reviewed. Constitutional: He is oriented to person, place, and time. He appears well-developed and well-nourished. No distress.  HENT:  Head: Normocephalic and atraumatic.  Right Ear: External ear normal.  Left Ear: External ear normal.  Nose: Nose normal.  Mouth/Throat: Oropharynx is clear and moist. No oropharyngeal exudate.  Eyes: Conjunctivae and EOM are normal. Pupils are equal, round, and reactive to light. Right eye exhibits no discharge. Left eye exhibits no discharge. No scleral icterus.  Neck: Normal range of motion. Neck supple. No JVD present. No tracheal deviation present. No thyromegaly present.  Cardiovascular: Normal  rate, regular rhythm and intact distal pulses.  Exam reveals no gallop and  no friction rub.   Murmur heard. Pulmonary/Chest: Effort normal and breath sounds normal. No stridor. No respiratory distress. He has no wheezes. He has no rales. He exhibits no tenderness.  Abdominal: Soft. Bowel sounds are normal. He exhibits no distension and no mass. There is no tenderness. There is no rebound and no guarding.  Musculoskeletal: Normal range of motion. He exhibits no edema and no tenderness.  Lymphadenopathy:    He has no cervical adenopathy.  Neurological: He is alert and oriented to person, place, and time. He has normal reflexes. He displays normal reflexes. No cranial nerve deficit. He exhibits normal muscle tone. Coordination normal.  Skin: Skin is warm. No rash noted. He is not diaphoretic. No erythema. No pallor.  Psychiatric: He has a normal mood and affect. His behavior is normal. Judgment and thought content normal.   patient refuses prostate exam.        Assessment & Plan:  Routine general medical examination at a health care facility - Plan: PSA  I will check a PSA today.  Otherwise immunizations are up-to-date. Colonoscopy is up-to-date. I recommended that the patient decrease his insulin to 5 units twice a day if he starts to see blood sugars less than 100 fasting. I would like to see the patient's weight load 300 pounds when I see him again in 3 months. His electrolytes and vitamin levels are being monitored and [facility]. His renal function is stable. His diabetes is excellent. Remainder of his preventive care is up to date. His HDL is low at 29. I recommended patient began increasing his aerobic exercise. I am hoping that this will help facilitate some weight loss and improve his stamina. Recheck in 3 months.

## 2013-12-27 LAB — PSA: PSA: 0.24 ng/mL (ref <=4.00)

## 2013-12-28 ENCOUNTER — Encounter: Payer: Self-pay | Admitting: Family Medicine

## 2014-01-02 ENCOUNTER — Telehealth: Payer: Self-pay | Admitting: Family Medicine

## 2014-01-02 NOTE — Telephone Encounter (Signed)
Patient aware of results.

## 2014-01-02 NOTE — Telephone Encounter (Signed)
Patient is calling about his lab results [phone]

## 2014-01-04 ENCOUNTER — Ambulatory Visit (INDEPENDENT_AMBULATORY_CARE_PROVIDER_SITE_OTHER): Payer: Medicaid Other | Admitting: Endocrinology

## 2014-01-04 ENCOUNTER — Encounter: Payer: Self-pay | Admitting: Endocrinology

## 2014-01-04 VITALS — BP 163/76 | HR 68 | Temp 98.3°F | Resp 16 | Ht 68.0 in | Wt 317.2 lb

## 2014-01-04 DIAGNOSIS — I10 Essential (primary) hypertension: Secondary | ICD-10-CM

## 2014-01-04 DIAGNOSIS — E1149 Type 2 diabetes mellitus with other diabetic neurological complication: Secondary | ICD-10-CM

## 2014-01-04 MED ORDER — METFORMIN HCL 500 MG PO TABS: 500.0000 mg | ORAL_TABLET | Freq: Two times a day (BID) | ORAL | Status: DC

## 2014-01-04 NOTE — Patient Instructions (Signed)
Metformin 500mg  2x daily  Lantus 15 units once daily and if sugar stays < 90 then stop

## 2014-01-04 NOTE — Progress Notes (Signed)
Patient ID: Alexander Hubbard, male   DOB: [birth year], 58 y.o.   MRN: [id number]   Reason for Appointment: Diabetes follow-up   History of Present Illness   Diagnosis: Type 2 DIABETES MELITUS, insulin requiring     PAST history: He has been on relatively large doses of insulin for a few years and was not well controlled until he started the U-500 insulin This particularly help his fasting hyperglycemia which was difficult to control. He only benefited partially from Victoza and this did not help him with weight loss significantly A1c had increased in 8/14  Insulin regimen: LANTUS 10-15 units twice a day   RECENT history:  He had bariatric surgery in early April 2015 Since then he has been able to stop his mealtime insulin and also has had significant reduction of his Lantus insulin Even with continued weight loss he still appears to be needing Lantus insulin On his own he is adjusting the Lantus based on his daily blood sugar and will take less than the blood sugar is below 100 To try to stop the Lantus but his blood sugar went up to 220 Is continuing  to lose a significant amount of weight and is using small portions at mealtime  Monitors blood glucose:  ? Twice a day   Glucometer: Accu-Chek        Blood Glucose readings from recall FASTING 98-115; highest 136 nonfasting Hypoglycemia:  none            Physical activity: exercise: Is able to walk a little, 7-10 min   Prior weight 406            Wt Readings from Last 3 Encounters:  01/04/14 317 lb 3.2 oz (143.881 kg)  12/26/13 321 lb (145.605 kg)  10/27/13 331 lb (150.141 kg)   Diabetes labs:  Lab Results  Component Value Date   HGBA1C 6.4 12/26/2013   HGBA1C 6.0 10/11/2013   HGBA1C 7.2* 07/29/2013   Lab Results  Component Value Date   MICROALBUR 83.1* 01/03/2013   LDLCALC 27 10/11/2013   CREATININE 1.4 12/26/2013         Medication List       This list is accurate as of: 01/04/14  3:54 PM.  Always use your most recent med  list.               ammonium lactate 12 % lotion  Commonly known as:  LAC-HYDRIN  Apply 1 application topically 2 (two) times daily as needed (legs).     aspirin EC 81 MG tablet  Take 81 mg by mouth daily.     B-D INS SYR ULTRAFINE .3CC/30G 30G X 1/2" 0.3 ML Misc  Generic drug:  Insulin Syringe-Needle U-100  USE AS DIRECTED 3 TIMES A DAY. DX CODE: 250.00     brinzolamide 1 % ophthalmic suspension  Commonly known as:  AZOPT  Place 1 drop into both eyes 2 (two) times daily.     calcium-vitamin D 500-200 MG-UNIT per tablet  Commonly known as:  OSCAL WITH D  Take 1 tablet by mouth 3 (three) times daily.     carvedilol 12.5 MG tablet  Commonly known as:  COREG  Take 10 mg by mouth 2 (two) times daily with a meal.     cholecalciferol 1000 UNITS tablet  Commonly known as:  VITAMIN D  Take 1,000 Units by mouth daily.     colchicine 0.6 MG tablet  TAKE 1 TABLET BY MOUTH EVERY DAY  docusate sodium 100 MG capsule  Commonly known as:  COLACE  Take 100 mg by mouth 2 (two) times daily.     febuxostat 40 MG tablet  Commonly known as:  ULORIC  Take 40 mg by mouth daily.     FIRST-LANSOPRAZOLE 3 MG/ML Susp  Take 13.3 mLs by mouth daily.     gabapentin 300 MG capsule  Commonly known as:  NEURONTIN  TAKE 1 CAPSULE BY MOUTH QHS     glucose blood test strip  Commonly known as:  ACCU-CHEK SMARTVIEW  Use as instructed to check blood sugars 6 times per day dx code 250.62     Insulin Glargine 100 UNIT/ML Solostar Pen  Commonly known as:  LANTUS  Inject 10 units 2 times daily.     Insulin Pen Needle 29G X 12.7MM Misc  Commonly known as:  BD ULTRA-FINE PEN NEEDLES  Use with insulin BID Dx - 250.00     latanoprost 0.005 % ophthalmic solution  Commonly known as:  XALATAN  Place 1 drop into the left eye at bedtime.     multivitamin capsule  Take 1 capsule by mouth 2 (two) times daily.     Petrolatum-Zinc Oxide 49-15 % Oint  Apply 1 application topically 2 (two) times  daily.     prednisoLONE acetate 1 % ophthalmic suspension  Commonly known as:  PRED FORTE  Place 1 drop into the right eye at bedtime.     PRESCRIPTION MEDICATION  Hydrocodone-Acetamin 7.5 - 325/48ml  10-20 mls q 4 hours     rosuvastatin 20 MG tablet  Commonly known as:  CRESTOR  Take 20 mg by mouth daily.     timolol 0.5 % ophthalmic solution  Commonly known as:  TIMOPTIC  Place 1 drop into both eyes 2 (two) times daily.     vitamin B-12 1000 MCG tablet  Commonly known as:  CYANOCOBALAMIN  Take 1,000 mcg by mouth. (Chewable tablet)        Allergies:  Allergies  Allergen Reactions  . Penicillins Swelling and Rash  . Niacin Itching    Other reaction(s): Flushing (ALLERGY/intolerance)  . Amoxicillin Rash and Swelling  . Penicillin G Rash    Past Medical History  Diagnosis Date  . Diabetes mellitus   . Hypertension   . Obesity   . Sleep apnea   . Gouty arthritis     Past Surgical History  Procedure Laterality Date  . Eye surgery    . Knee surgery    . Gastric bypass      Family History  Problem Relation Age of Onset  . Hypertension Sister   . Diabetes Mellitus II Sister     Social History:  reports that he has never smoked. He has never used smokeless tobacco. He reports that he does not drink alcohol or use illicit drugs.  Review of Systems:  HYPERTENSION:  currently on Coreg as needed; he takes it when the blood pressure is high only. He is  currently being treated by nephrologist, home reading variable. Is on liquid formulation  HYPERLIPIDEMIA: The lipid abnormality consists of elevated triglycerides. The niacin was stopped by  bariatric surgeon. He is taking Crestor even though he was told to stop this because of very low LDL  Lab Results  Component Value Date   CHOL 93 10/11/2013   HDL 29.10* 10/11/2013   LDLCALC 27 10/11/2013   TRIG 183.0* 10/11/2013   CHOLHDL 3 10/11/2013   He has history of neuropathy  He does  have history of nephropathy with  moderate renal dysfunction which had improved recently  Lab Results  Component Value Date   CREATININE 1.4 12/26/2013      Examination:   BP 163/76  Pulse 68  Temp(Src) 98.3 F (36.8 C)  Resp 16  Ht 5\' 8"  (1.727 m)  Wt 317 lb 3.2 oz (143.881 kg)  BMI 48.24 kg/m2  SpO2 97%  Body mass index is 48.24 kg/(m^2).   ASSESSMENT/ PLAN:   Diabetes type 2   His blood sugars appear to be fairly good now and overall he is using only small amount of basal insulin However since his blood sugars are still over 100 Will still need to continue his basal insulin At this time may be able to try metformin since his renal function is better For convenience he will take the Lantus only once a day Instructions:  Metformin 500mg  2x daily Lantus 15 units once daily and if sugar stays < 90 then stop  Hyperlipidemia: Since LDL was only 27 he can leave off Crestor  Hypertension: He will need to discuss treatment with his nephrologist or PCP  [facility],Laquitha Heslin 01/04/2014, 3:54 PM

## 2014-01-06 ENCOUNTER — Telehealth: Payer: Self-pay | Admitting: *Deleted

## 2014-01-06 ENCOUNTER — Other Ambulatory Visit: Payer: Self-pay | Admitting: *Deleted

## 2014-01-06 MED ORDER — INSULIN GLARGINE 100 UNIT/ML SOLOSTAR PEN: PEN_INJECTOR | SUBCUTANEOUS | Status: DC

## 2014-01-06 MED ORDER — ROSUVASTATIN CALCIUM 20 MG PO TABS: 20.0000 mg | ORAL_TABLET | Freq: Every day | ORAL | Status: DC

## 2014-01-06 MED ORDER — INSULIN GLARGINE 100 UNIT/ML SOLOSTAR PEN: 15.0000 [IU] | PEN_INJECTOR | Freq: Two times a day (BID) | SUBCUTANEOUS | Status: DC

## 2014-01-06 NOTE — Telephone Encounter (Signed)
crestor 20mg  Take one tablet by mouth daily at bedtime   Medication refilled

## 2014-01-09 ENCOUNTER — Other Ambulatory Visit: Payer: Self-pay | Admitting: *Deleted

## 2014-01-09 MED ORDER — INSULIN GLARGINE 100 UNIT/ML SOLOSTAR PEN: PEN_INJECTOR | SUBCUTANEOUS | Status: DC

## 2014-01-09 MED ORDER — ROSUVASTATIN CALCIUM 20 MG PO TABS: 20.0000 mg | ORAL_TABLET | Freq: Every day | ORAL | Status: DC

## 2014-01-09 NOTE — Telephone Encounter (Signed)
Refill appropriate and filled per protocol. 

## 2014-01-20 ENCOUNTER — Telehealth: Payer: Self-pay | Admitting: Family Medicine

## 2014-01-20 NOTE — Telephone Encounter (Signed)
I would be concerned about him taking it given his chronic kidney disease.

## 2014-01-20 NOTE — Telephone Encounter (Signed)
Patient called saying that dr pickard had taken him off of his metformin and now his other doctor has prescribed it for him, he wants to know does he take it or not  [phone]

## 2014-01-23 NOTE — Telephone Encounter (Signed)
Pt aware and he thinks the other doctor is not aware of his CKD but he will call and let him know that that is why you took him off the Metformin.

## 2014-01-24 ENCOUNTER — Ambulatory Visit (INDEPENDENT_AMBULATORY_CARE_PROVIDER_SITE_OTHER): Payer: Medicaid Other | Admitting: Family Medicine

## 2014-01-24 ENCOUNTER — Encounter: Payer: Self-pay | Admitting: Family Medicine

## 2014-01-24 VITALS — BP 132/74 | HR 74 | Temp 98.0°F | Resp 18 | Ht 68.0 in | Wt 314.0 lb

## 2014-01-24 DIAGNOSIS — J029 Acute pharyngitis, unspecified: Secondary | ICD-10-CM

## 2014-01-24 NOTE — Progress Notes (Signed)
Subjective:    Patient ID: Alexander Hubbard, male    DOB: [birth year], 58 y.o.   MRN: [id number]  HPI Patient has had a sore throat for one day. He denies any fever. He does report rhinorrhea. He denies any cough his nephew has that as well. He has no known exposure to strep or mono. Past Medical History  Diagnosis Date  . Diabetes mellitus   . Hypertension   . Obesity   . Sleep apnea   . Gouty arthritis    Current Outpatient Prescriptions on File Prior to Visit  Medication Sig Dispense Refill  . ammonium lactate (LAC-HYDRIN) 12 % lotion Apply 1 application topically 2 (two) times daily as needed (legs).  400 g  2  . aspirin EC 81 MG tablet Take 81 mg by mouth daily.      . brinzolamide (AZOPT) 1 % ophthalmic suspension Place 1 drop into both eyes 2 (two) times daily.        . calcium-vitamin D (OSCAL WITH D) 500-200 MG-UNIT per tablet Take 1 tablet by mouth 3 (three) times daily.      . carvedilol (COREG) 12.5 MG tablet Take 10 mg by mouth 2 (two) times daily with a meal.       . cholecalciferol (VITAMIN D) 1000 UNITS tablet Take 1,000 Units by mouth daily.      . colchicine 0.6 MG tablet TAKE 1 TABLET BY MOUTH EVERY DAY  30 tablet  5  . docusate sodium (COLACE) 100 MG capsule Take 100 mg by mouth 2 (two) times daily.      . febuxostat (ULORIC) 40 MG tablet Take 40 mg by mouth daily.       Marland Kitchen FIRST-LANSOPRAZOLE 3 MG/ML SUSP Take 13.3 mLs by mouth daily.  300 mL  5  . gabapentin (NEURONTIN) 300 MG capsule TAKE 1 CAPSULE BY MOUTH QHS      . glucose blood (ACCU-CHEK SMARTVIEW) test strip Use as instructed to check blood sugars 6 times per day dx code 250.62  200 each  5  . Insulin Glargine (LANTUS) 100 UNIT/ML Solostar Pen Inject 15 units once daily if sugar goes < 90 then stop  15 mL  3  . Insulin Pen Needle (BD ULTRA-FINE PEN NEEDLES) 29G X 12.7MM MISC Use with insulin BID Dx - 250.00  100 each  11  . Insulin Syringe-Needle U-100 (B-D INS SYR ULTRAFINE .3CC/30G) 30G X 1/2" 0.3 ML MISC USE  AS DIRECTED 3 TIMES A DAY. DX CODE: 250.00      . latanoprost (XALATAN) 0.005 % ophthalmic solution Place 1 drop into the left eye at bedtime.       . Multiple Vitamin (MULTIVITAMIN) capsule Take 1 capsule by mouth 2 (two) times daily.      Marland Kitchen Petrolatum-Zinc Oxide 49-15 % OINT Apply 1 application topically 2 (two) times daily.  1 Tube  5  . prednisoLONE acetate (PRED FORTE) 1 % ophthalmic suspension Place 1 drop into the right eye at bedtime.       Marland Kitchen PRESCRIPTION MEDICATION Hydrocodone-Acetamin 7.5 - 325/94ml  10-20 mls q 4 hours      . rosuvastatin (CRESTOR) 20 MG tablet Take 1 tablet (20 mg total) by mouth daily.  30 tablet  3  . timolol (TIMOPTIC) 0.5 % ophthalmic solution Place 1 drop into both eyes 2 (two) times daily.      . vitamin B-12 (CYANOCOBALAMIN) 1000 MCG tablet Take 1,000 mcg by mouth. (Chewable tablet)  No current facility-administered medications on file prior to visit.   Allergies  Allergen Reactions  . Penicillins Swelling and Rash  . Niacin Itching    Other reaction(s): Flushing (ALLERGY/intolerance)  . Amoxicillin Rash and Swelling  . Penicillin G Rash   History   Social History  . Marital Status: Married    Spouse Name: N/A    Number of Children: N/A  . Years of Education: N/A   Occupational History  . Not on file.   Social History Main Topics  . Smoking status: Never Smoker   . Smokeless tobacco: Never Used  . Alcohol Use: No  . Drug Use: No  . Sexual Activity: Not on file   Other Topics Concern  . Not on file   Social History Narrative  . No narrative on file      Review of Systems  All other systems reviewed and are negative.      Objective:   Physical Exam  Vitals reviewed. HENT:  Right Ear: External ear normal.  Left Ear: External ear normal.  Nose: Nose normal.  Mouth/Throat: Oropharynx is clear and moist. No oropharyngeal exudate, posterior oropharyngeal edema, posterior oropharyngeal erythema or tonsillar abscesses.  Eyes:  Conjunctivae are normal. No scleral icterus.  Neck: Neck supple.  Cardiovascular: Normal rate, regular rhythm and normal heart sounds.   Pulmonary/Chest: Effort normal and breath sounds normal. No respiratory distress. He has no wheezes. He has no rales.  Lymphadenopathy:    He has no cervical adenopathy.          Assessment & Plan:  1. Viral pharyngitis Recommended tincture of time and expect spontaneous resolution in 5-7 days.  Patient to use Cepacol lozenges, 2 lozenges every 6 hours as needed for sore throat.  Followup next week if no better or immediately if worsening.

## 2014-02-02 ENCOUNTER — Ambulatory Visit (INDEPENDENT_AMBULATORY_CARE_PROVIDER_SITE_OTHER): Payer: Medicaid Other | Admitting: Podiatrist

## 2014-02-02 DIAGNOSIS — B351 Tinea unguium: Secondary | ICD-10-CM

## 2014-02-02 DIAGNOSIS — M79609 Pain in unspecified limb: Secondary | ICD-10-CM

## 2014-02-02 DIAGNOSIS — M79676 Pain in unspecified toe(s): Principal | ICD-10-CM

## 2014-02-06 NOTE — Progress Notes (Signed)
Subjective: Patient presents today with his wife for foot and nail care. The patient relatesthe ulcers on the tops of the toes on the left foot have all healed. He also recently underwent weight loss surgery and has lost close to 100 pounds so far. Patient also has painful thickened toenails of which he would like debrided at today's visit as well.  Objective: Neurovascular status is unchanged with neuropathy present and faintly palpable pedal pulses bilateral. The patient has absent hair growth and capillary refill time which is decreased bilateral. Patient's toenails are elongated, thickened and dystrophic.  Assessment: symptomatic mycotic nails  Plan: toenails debrided today without complication. He will be seen back in 3 months or as needed.

## 2014-02-08 ENCOUNTER — Other Ambulatory Visit: Payer: Self-pay | Admitting: Family Medicine

## 2014-02-10 ENCOUNTER — Other Ambulatory Visit (INDEPENDENT_AMBULATORY_CARE_PROVIDER_SITE_OTHER): Payer: Medicaid Other

## 2014-02-10 DIAGNOSIS — E1149 Type 2 diabetes mellitus with other diabetic neurological complication: Secondary | ICD-10-CM

## 2014-02-10 LAB — BASIC METABOLIC PANEL
BUN: 38 mg/dL — AB (ref 6–23)
CO2: 25 meq/L (ref 19–32)
Calcium: 9.7 mg/dL (ref 8.4–10.5)
Chloride: 106 mEq/L (ref 96–112)
Creatinine, Ser: 1.4 mg/dL (ref 0.4–1.5)
GFR: 54.39 mL/min — AB (ref >60.00)
GLUCOSE: 125 mg/dL — AB (ref 70–99)
POTASSIUM: 3.9 meq/L (ref 3.5–5.1)
Sodium: 138 mEq/L (ref 135–145)

## 2014-02-13 LAB — FRUCTOSAMINE: Fructosamine: 208 umol/L (ref 190–270)

## 2014-02-15 ENCOUNTER — Ambulatory Visit (INDEPENDENT_AMBULATORY_CARE_PROVIDER_SITE_OTHER): Payer: Medicaid Other | Admitting: Endocrinology

## 2014-02-15 ENCOUNTER — Encounter: Payer: Self-pay | Admitting: Endocrinology

## 2014-02-15 VITALS — BP 108/65 | HR 63 | Temp 98.2°F | Resp 16 | Ht 68.0 in | Wt 306.8 lb

## 2014-02-15 DIAGNOSIS — Z23 Encounter for immunization: Secondary | ICD-10-CM

## 2014-02-15 DIAGNOSIS — E1149 Type 2 diabetes mellitus with other diabetic neurological complication: Secondary | ICD-10-CM

## 2014-02-15 MED ORDER — SITAGLIPTIN PHOSPHATE 100 MG PO TABS: 100.0000 mg | ORAL_TABLET | Freq: Every day | ORAL | Status: DC

## 2014-02-15 NOTE — Progress Notes (Signed)
Patient ID: Alexander Hubbard, male   DOB: 1956-01-30, 58 y.o.   MRN: [id number]   Reason for Appointment: Diabetes follow-up   History of Present Illness   Diagnosis: Type 2 DIABETES MELITUS, insulin requiring     PAST history: He has been on relatively large doses of insulin for a few years and was not well controlled until he started the U-500 insulin This particularly help his fasting hyperglycemia which was difficult to control. He only benefited partially from Victoza and this did not help him with weight loss significantly A1c had increased in 8/14  Insulin regimen: LANTUS 10-15 units, usually once a day  RECENT history:  He has had significant improvement in his control after bariatric surgery in early April 2015 Previously was on basal bolus insulin regimen Even with continued weight loss he still appears to be needing some Lantus insulin Since his creatinine clearance was over 50 he was advised to start metformin on his last visit but apparently was told by his nephrologist not to do so he On his own he is adjusting the Lantus based on his daily blood sugar and will take 5 units less when blood sugar is less than 90 and occasionally will skip the dose With skipping his Lantus in the morning he had a relatively high reading of 200 at bedtime However he tends to be relatively higher at bedtime than in the morning Is continuing  to lose a significant amount of weight and is getting small portions or protein shake at mealtimes  Monitors blood glucose: 1.3 times a day   Glucometer: Accu-Chek        Blood Glucose readings from download  PREMEAL Breakfast Lunch  4 PM  Bedtime Overall  Glucose range:  88-126   91-129   90-114   137-200    Mean/median:  109     164   117   Hypoglycemia:  none            Physical activity: exercise:  At [facility] uses bike 15 min plus weights Prior weight 406            Wt Readings from Last 3 Encounters:  02/15/14 306 lb 12.8 oz (139.164 kg)  01/24/14 314  lb (142.429 kg)  01/04/14 317 lb 3.2 oz (143.881 kg)   Diabetes labs:  Lab Results  Component Value Date   HGBA1C 6.4 12/26/2013   HGBA1C 6.0 10/11/2013   HGBA1C 7.2* 07/29/2013   Lab Results  Component Value Date   MICROALBUR 83.1* 01/03/2013   LDLCALC 27 10/11/2013   CREATININE 1.4 02/10/2014         Medication List       This list is accurate as of: 02/15/14  1:26 PM.  Always use your most recent med list.               ammonium lactate 12 % lotion  Commonly known as:  LAC-HYDRIN  Apply 1 application topically 2 (two) times daily as needed (legs).     aspirin EC 81 MG tablet  Take 81 mg by mouth daily.     B-D INS SYR ULTRAFINE .3CC/30G 30G X 1/2" 0.3 ML Misc  Generic drug:  Insulin Syringe-Needle U-100  USE AS DIRECTED 3 TIMES A DAY. DX CODE: 250.00     brinzolamide 1 % ophthalmic suspension  Commonly known as:  AZOPT  Place 1 drop into both eyes 2 (two) times daily.     calcium-vitamin D 500-200 MG-UNIT per tablet  Commonly known as:  OSCAL WITH D  Take 1 tablet by mouth 3 (three) times daily.     carvedilol 12.5 MG tablet  Commonly known as:  COREG  Take 10 mg by mouth 2 (two) times daily with a meal.     cholecalciferol 1000 UNITS tablet  Commonly known as:  VITAMIN D  Take 1,000 Units by mouth daily.     colchicine 0.6 MG tablet  TAKE 1 TABLET BY MOUTH EVERY DAY     docusate sodium 100 MG capsule  Commonly known as:  COLACE  Take 100 mg by mouth 2 (two) times daily.     febuxostat 40 MG tablet  Commonly known as:  ULORIC  Take 40 mg by mouth daily.     FIRST-LANSOPRAZOLE 3 MG/ML Susp  Take 13.3 mLs by mouth daily.     gabapentin 300 MG capsule  Commonly known as:  NEURONTIN  TAKE 1 CAPSULE BY MOUTH QHS     glucose blood test strip  Commonly known as:  ACCU-CHEK SMARTVIEW  Use as instructed to check blood sugars 6 times per day dx code 250.62     Insulin Glargine 100 UNIT/ML Solostar Pen  Commonly known as:  LANTUS  Inject 15 units once  daily if sugar goes < 90 then stop     Insulin Pen Needle 29G X 12.7MM Misc  Commonly known as:  BD ULTRA-FINE PEN NEEDLES  Use with insulin BID Dx - 250.00     latanoprost 0.005 % ophthalmic solution  Commonly known as:  XALATAN  Place 1 drop into the left eye at bedtime.     multivitamin capsule  Take 1 capsule by mouth 2 (two) times daily.     pantoprazole 40 MG tablet  Commonly known as:  PROTONIX  TAKE 1 TABLET BY MOUTH TWICE A DAY     Petrolatum-Zinc Oxide 49-15 % Oint  Apply 1 application topically 2 (two) times daily.     prednisoLONE acetate 1 % ophthalmic suspension  Commonly known as:  PRED FORTE  Place 1 drop into the right eye at bedtime.     PRESCRIPTION MEDICATION  Hydrocodone-Acetamin 7.5 - 325/91ml  10-20 mls q 4 hours     rosuvastatin 20 MG tablet  Commonly known as:  CRESTOR  Take 1 tablet (20 mg total) by mouth daily.     timolol 0.5 % ophthalmic solution  Commonly known as:  TIMOPTIC  Place 1 drop into both eyes 2 (two) times daily.     vitamin B-12 1000 MCG tablet  Commonly known as:  CYANOCOBALAMIN  Take 1,000 mcg by mouth. (Chewable tablet)        Allergies:  Allergies  Allergen Reactions  . Penicillins Swelling and Rash  . Niacin Itching    Other reaction(s): Flushing (ALLERGY/intolerance)  . Amoxicillin Rash and Swelling  . Penicillin G Rash    Past Medical History  Diagnosis Date  . Diabetes mellitus   . Hypertension   . Obesity   . Sleep apnea   . Gouty arthritis     Past Surgical History  Procedure Laterality Date  . Eye surgery    . Knee surgery    . Gastric bypass      Family History  Problem Relation Age of Onset  . Hypertension Sister   . Diabetes Mellitus II Sister     Social History:  reports that he has never smoked. He has never used smokeless tobacco. He reports that he does not drink  alcohol or use illicit drugs.  Review of Systems:  HYPERTENSION:  currently on Coreg as needed; he takes it when the  blood pressure is high only. He is  currently being treated by nephrologist  HYPERLIPIDEMIA: Had mostly triglyceride increase prior to bariatric surgery and weight loss. The niacin was stopped by  bariatric surgeon And also Crestor was stopped on his last visit   Lab Results  Component Value Date   CHOL 93 10/11/2013   HDL 29.10* 10/11/2013   LDLCALC 27 10/11/2013   TRIG 183.0* 10/11/2013   CHOLHDL 3 10/11/2013   He has history of neuropathy  He does have history of nephropathy with  history of renal dysfunction which  has significantly improved  and creatinine is consistently near normal  Lab Results  Component Value Date   CREATININE 1.4 02/10/2014      Examination:   BP 108/65  Pulse 63  Temp(Src) 98.2 F (36.8 C)  Resp 16  Ht  (1.727 m)  Wt 306 lb 12.8 oz (139.164 kg)  BMI 46.66 kg/m2  SpO2 96%  Body mass index is 46.66 kg/(m^2).   ASSESSMENT/ PLAN:   Diabetes type 2   His blood sugars appear to be fairly good now and overall he is using only small amount of basal insulin However since his blood sugars are  still relatively high at night he probably does need better postprandial control Although he should be able to take metformin safely his nephrologist has not allow him to take this He is a good candidate for Januvia although long-term may not require any hypoglycemic drugs Will give this a try and try to stop his Lantus after one week, he will call if he has any difficulties with control  Patient Instructions  10 units in am once daily for 1 week then stop, need to be on Januvia 1 week     Ayannah Faddis 02/15/2014, 1:26 PM

## 2014-02-15 NOTE — Patient Instructions (Signed)
10 units in am once daily for 1 week then stop, need to be on Januvia 1 week

## 2014-03-04 ENCOUNTER — Other Ambulatory Visit: Payer: Self-pay | Admitting: Family Medicine

## 2014-03-05 ENCOUNTER — Emergency Department (HOSPITAL_COMMUNITY): Payer: Medicaid Other

## 2014-03-05 ENCOUNTER — Encounter (HOSPITAL_COMMUNITY): Payer: Self-pay | Admitting: Emergency Medicine

## 2014-03-05 ENCOUNTER — Emergency Department (HOSPITAL_COMMUNITY)
Admission: EM | Admit: 2014-03-05 | Discharge: 2014-03-05 | Disposition: A | Discharge status: Home / Self Care | Payer: Medicaid Other | Attending: Emergency Medicine | Admitting: Emergency Medicine

## 2014-03-05 DIAGNOSIS — Z794 Long term (current) use of insulin: Secondary | ICD-10-CM | POA: Diagnosis not present

## 2014-03-05 DIAGNOSIS — Z7982 Long term (current) use of aspirin: Secondary | ICD-10-CM | POA: Insufficient documentation

## 2014-03-05 DIAGNOSIS — E669 Obesity, unspecified: Secondary | ICD-10-CM | POA: Insufficient documentation

## 2014-03-05 DIAGNOSIS — Z79899 Other long term (current) drug therapy: Secondary | ICD-10-CM | POA: Insufficient documentation

## 2014-03-05 DIAGNOSIS — Z880 Allergy status to penicillin: Secondary | ICD-10-CM | POA: Insufficient documentation

## 2014-03-05 DIAGNOSIS — J189 Pneumonia, unspecified organism: Secondary | ICD-10-CM

## 2014-03-05 DIAGNOSIS — E119 Type 2 diabetes mellitus without complications: Secondary | ICD-10-CM | POA: Insufficient documentation

## 2014-03-05 DIAGNOSIS — J159 Unspecified bacterial pneumonia: Secondary | ICD-10-CM | POA: Diagnosis not present

## 2014-03-05 DIAGNOSIS — I10 Essential (primary) hypertension: Secondary | ICD-10-CM | POA: Diagnosis not present

## 2014-03-05 DIAGNOSIS — R509 Fever, unspecified: Secondary | ICD-10-CM | POA: Diagnosis present

## 2014-03-05 LAB — CBC WITH DIFFERENTIAL/PLATELET
Basophils Absolute: 0 10*3/uL (ref 0.0–0.1)
Basophils Relative: 0 % (ref 0–1)
EOS ABS: 0.1 10*3/uL (ref 0.0–0.7)
Eosinophils Relative: 1 % (ref 0–5)
HEMATOCRIT: 33.8 % — AB (ref 39.0–52.0)
Hemoglobin: 10.9 g/dL — ABNORMAL LOW (ref 13.0–17.0)
LYMPHS ABS: 2.1 10*3/uL (ref 0.7–4.0)
LYMPHS PCT: 17 % (ref 12–46)
MCH: 25.6 pg — ABNORMAL LOW (ref 26.0–34.0)
MCHC: 32.2 g/dL (ref 30.0–36.0)
MCV: 79.3 fL (ref 78.0–100.0)
MONO ABS: 0.8 10*3/uL (ref 0.1–1.0)
MONOS PCT: 6 % (ref 3–12)
Neutro Abs: 9.7 10*3/uL — ABNORMAL HIGH (ref 1.7–7.7)
Neutrophils Relative %: 76 % (ref 43–77)
Platelets: 174 10*3/uL (ref 150–400)
RBC: 4.26 MIL/uL (ref 4.22–5.81)
RDW: 14.7 % (ref 11.5–15.5)
WBC: 12.8 10*3/uL — AB (ref 4.0–10.5)

## 2014-03-05 LAB — URINALYSIS, ROUTINE W REFLEX MICROSCOPIC
Bilirubin Urine: NEGATIVE
Glucose, UA: NEGATIVE mg/dL
HGB URINE DIPSTICK: NEGATIVE
Ketones, ur: 15 mg/dL — AB
NITRITE: NEGATIVE
SPECIFIC GRAVITY, URINE: 1.015 (ref 1.005–1.030)
UROBILINOGEN UA: 0.2 mg/dL (ref 0.0–1.0)
pH: 5 (ref 5.0–8.0)

## 2014-03-05 LAB — COMPREHENSIVE METABOLIC PANEL
ALT: 8 U/L (ref 0–53)
ANION GAP: 14 (ref 5–15)
AST: 12 U/L (ref 0–37)
Albumin: 3 g/dL — ABNORMAL LOW (ref 3.5–5.2)
Alkaline Phosphatase: 68 U/L (ref 39–117)
BUN: 41 mg/dL — AB (ref 6–23)
CALCIUM: 9.5 mg/dL (ref 8.4–10.5)
CO2: 23 meq/L (ref 19–32)
CREATININE: 1.78 mg/dL — AB (ref 0.50–1.35)
Chloride: 102 mEq/L (ref 96–112)
GFR, EST AFRICAN AMERICAN: 47 mL/min — AB (ref >90)
GFR, EST NON AFRICAN AMERICAN: 40 mL/min — AB (ref >90)
Glucose, Bld: 123 mg/dL — ABNORMAL HIGH (ref 70–99)
Potassium: 4.6 mEq/L (ref 3.7–5.3)
Sodium: 139 mEq/L (ref 137–147)
TOTAL PROTEIN: 7.1 g/dL (ref 6.0–8.3)
Total Bilirubin: 0.5 mg/dL (ref 0.3–1.2)

## 2014-03-05 LAB — URINE MICROSCOPIC-ADD ON

## 2014-03-05 MED ORDER — SODIUM CHLORIDE 0.9 % IV BOLUS (SEPSIS)
1000.0000 mL | Freq: Once | INTRAVENOUS | Status: AC
  Administered 2014-03-05: 1000 mL via INTRAVENOUS

## 2014-03-05 MED ORDER — LEVOFLOXACIN 750 MG PO TABS: 750.0000 mg | ORAL_TABLET | Freq: Every day | ORAL | Status: DC

## 2014-03-05 MED ORDER — LEVOFLOXACIN 750 MG PO TABS
750.0000 mg | ORAL_TABLET | Freq: Once | ORAL | Status: AC
  Administered 2014-03-05: 750 mg via ORAL
  Filled 2014-03-05: qty 1

## 2014-03-05 NOTE — ED Notes (Signed)
Per pt sts cough, fever since having a procedure a week ago.

## 2014-03-05 NOTE — ED Notes (Signed)
Pt discharged to home with wife after verbalizing understanding of discharge instructions and medications. NAD noted.

## 2014-03-05 NOTE — ED Provider Notes (Addendum)
CSN: [id number]     Arrival date & time 03/05/14  1255 History   First MD Initiated Contact with Patient 03/05/14 1320     Chief Complaint  Patient presents with  . Cough  . Fever     (Consider location/radiation/quality/duration/timing/severity/associated sxs/prior Treatment) Patient is a 58 y.o. male presenting with cough and fever. The history is provided by the patient and the spouse.  Cough Cough characteristics:  Non-productive Severity:  Moderate Onset quality:  Gradual Duration:  2 days Timing:  Constant Progression:  Worsening Chronicity:  New Smoker: no   Context comment:  Recent endoscopy 4 days ago and for last 3 days pt has felt weak, chilled Relieved by:  Nothing Worsened by:  Activity Ineffective treatments:  Rest Associated symptoms: chills and fever   Associated symptoms: no rhinorrhea, no shortness of breath, no sinus congestion, no sore throat and no wheezing   Associated symptoms comment:  Generalized weakness Fever:    Duration:  1 day   Timing:  Intermittent   Max temp PTA (F):  100.8   Temp source:  Oral Risk factors: no recent infection and no recent travel   Fever Associated symptoms: chills and cough   Associated symptoms: no rhinorrhea and no sore throat     Past Medical History  Diagnosis Date  . Diabetes mellitus   . Hypertension   . Obesity   . Sleep apnea   . Gouty arthritis    Past Surgical History  Procedure Laterality Date  . Eye surgery    . Knee surgery    . Gastric bypass     Family History  Problem Relation Age of Onset  . Hypertension Sister   . Diabetes Mellitus II Sister    History  Substance Use Topics  . Smoking status: Never Smoker   . Smokeless tobacco: Never Used  . Alcohol Use: No    Review of Systems  Constitutional: Positive for fever and chills.  HENT: Negative for rhinorrhea and sore throat.   Respiratory: Positive for cough. Negative for shortness of breath and wheezing.   All other systems  reviewed and are negative.     Allergies  Penicillins; Amoxicillin; Niacin; and Penicillin g  Home Medications   Prior to Admission medications   Medication Sig Start Date End Date Taking? Authorizing Provider  ammonium lactate (LAC-HYDRIN) 12 % lotion Apply 1 application topically 2 (two) times daily as needed (legs). 08/30/13   Delories Heinz, DPM  aspirin EC 81 MG tablet Take 81 mg by mouth daily.    Historical Provider, MD  brinzolamide (AZOPT) 1 % ophthalmic suspension Place 1 drop into both eyes 2 (two) times daily.      Historical Provider, MD  docusate sodium (COLACE) 100 MG capsule Take 100 mg by mouth 2 (two) times daily.    Historical Provider, MD  febuxostat (ULORIC) 40 MG tablet Take 40 mg by mouth daily.     Historical Provider, MD  latanoprost (XALATAN) 0.005 % ophthalmic solution Place 1 drop into the left eye at bedtime.     Historical Provider, MD  Multiple Vitamin (MULTIVITAMIN) capsule Take 1 capsule by mouth 2 (two) times daily.    Historical Provider, MD  prednisoLONE acetate (PRED FORTE) 1 % ophthalmic suspension Place 1 drop into the right eye at bedtime.     Historical Provider, MD  rosuvastatin (CRESTOR) 20 MG tablet Take 1 tablet (20 mg total) by mouth daily. 01/09/14   Donita Brooks, MD  timolol (TIMOPTIC)  0.5 % ophthalmic solution Place 1 drop into both eyes 2 (two) times daily.    Historical Provider, MD  vitamin B-12 (CYANOCOBALAMIN) 1000 MCG tablet Take 1,000 mcg by mouth. (Chewable tablet)    Historical Provider, MD   BP 113/97  Pulse 73  Temp(Src) 99.5 F (37.5 C)  Resp 18  SpO2 94% Physical Exam  Nursing note and vitals reviewed. Constitutional: He is oriented to person, place, and time. He appears well-developed and well-nourished. No distress.  HENT:  Head: Normocephalic and atraumatic.  Mouth/Throat: Oropharynx is clear and moist.  Eyes: Conjunctivae and EOM are normal. Pupils are equal, round, and reactive to light.  Neck: Normal range  of motion. Neck supple.  Cardiovascular: Normal rate, regular rhythm and intact distal pulses.   No murmur heard. Pulmonary/Chest: Effort normal and breath sounds normal. No respiratory distress. He has no wheezes. He has no rales.  Abdominal: Soft. He exhibits no distension. There is no tenderness. There is no rebound and no guarding.  Musculoskeletal: Normal range of motion. He exhibits edema. He exhibits no tenderness.  Trace edema bilaterally  Neurological: He is alert and oriented to person, place, and time.  Skin: Skin is warm and dry. No rash noted. No erythema.  Psychiatric: He has a normal mood and affect. His behavior is normal.    ED Course  Procedures (including critical care time) Labs Review Labs Reviewed  CBC WITH DIFFERENTIAL - Abnormal; Notable for the following:    WBC 12.8 (*)    Hemoglobin 10.9 (*)    HCT 33.8 (*)    MCH 25.6 (*)    Neutro Abs 9.7 (*)    All other components within normal limits  COMPREHENSIVE METABOLIC PANEL - Abnormal; Notable for the following:    Glucose, Bld 123 (*)    BUN 41 (*)    Creatinine, Ser 1.78 (*)    Albumin 3.0 (*)    GFR calc non Af Amer 40 (*)    GFR calc Af Amer 47 (*)    All other components within normal limits  URINALYSIS, ROUTINE W REFLEX MICROSCOPIC    Imaging Review Dg Chest 2 View  03/05/2014   CLINICAL DATA:  Productive cough 3 days with dizziness, weakness and right neck and back pain.  EXAM: CHEST  2 VIEW  COMPARISON:  08/04/2013  FINDINGS: Lungs are adequately inflated with lingular airspace consolidation. No evidence of effusion. Cardiomediastinal silhouette and remainder the exam is unchanged.  IMPRESSION: Lingular airspace process compatible with a pneumonia.   Electronically Signed   By: Elberta Fortis M.D.   On: 03/05/2014 14:09     EKG Interpretation None      MDM   Final diagnoses:  CAP (community acquired pneumonia)    Patient presenting with worsening weakness, chills, cough that started 3  days ago after an endoscopy on Thursday. Patient also noted a fever of 100.8 yesterday. He states he's just not feeling any better. During the endoscopy patient was found to have a stomach ulcer which they are treating him for. He was supposed to get a colonoscopy on Friday but due to not feeling well it was postponed until Monday. He is not taking any bowel prep.  He is still able to eat and drink however do to his recent bypass surgery he can only taken liquids. He is still taking all the medications.  He denies any shortness of breath. Exam without any significant findings. Labs with mild elevation of his creatinine 0.78 from 1.6.  Mild leukocytosis of 12.8 and stable hemoglobin. Chest x-ray with signs of the lingular airspace process compatible with pneumonia. We'll treat patient with Levaquin. Throughout patient's stay he had a normal blood pressure, heart rate and oxygen saturation of 95% and higher. Feel that he is safe for discharge home on Levaquin and following up with Dr. Tanya NonesPickard this week for recheck. Patient and wife are comfortable with plan and he was given strict return precautions.    Gwyneth SproutWhitney Honey Zakarian, MD 03/05/14 1530  Gwyneth SproutWhitney Tanaja Ganger, MD 03/05/14 [phone]

## 2014-03-05 NOTE — ED Notes (Signed)
Pt presents to ED with weakness and coughing since endoscopy on Thursday. Has been coughing up sputum x 2 days. Wife states that he has had fever since yesterday. Endoscopy on Thursday showed ulcer, and he was to undergo colonoscopy on Friday but did not feel well enough to go. Pt had gastric bypass on 08/30/13 and has lost 96 pounds. NAD noted.

## 2014-03-13 ENCOUNTER — Inpatient Hospital Stay: Payer: Medicaid Other | Admitting: Family Medicine

## 2014-03-16 ENCOUNTER — Ambulatory Visit (INDEPENDENT_AMBULATORY_CARE_PROVIDER_SITE_OTHER): Payer: Medicaid Other | Admitting: Family Medicine

## 2014-03-16 ENCOUNTER — Encounter: Payer: Self-pay | Admitting: Family Medicine

## 2014-03-16 VITALS — BP 100/62 | HR 64 | Temp 98.3°F | Resp 20 | Ht 68.0 in | Wt 299.0 lb

## 2014-03-16 DIAGNOSIS — Z09 Encounter for follow-up examination after completed treatment for conditions other than malignant neoplasm: Secondary | ICD-10-CM

## 2014-03-16 LAB — CBC WITH DIFFERENTIAL/PLATELET
Basophils Absolute: 0 10*3/uL (ref 0.0–0.1)
Basophils Relative: 0 % (ref 0–1)
EOS ABS: 0.2 10*3/uL (ref 0.0–0.7)
Eosinophils Relative: 2 % (ref 0–5)
HEMATOCRIT: 34.8 % — AB (ref 39.0–52.0)
Hemoglobin: 11.5 g/dL — ABNORMAL LOW (ref 13.0–17.0)
Lymphocytes Relative: 25 % (ref 12–46)
Lymphs Abs: 2.4 10*3/uL (ref 0.7–4.0)
MCH: 25.1 pg — ABNORMAL LOW (ref 26.0–34.0)
MCHC: 33 g/dL (ref 30.0–36.0)
MCV: 75.8 fL — AB (ref 78.0–100.0)
MONO ABS: 0.5 10*3/uL (ref 0.1–1.0)
Monocytes Relative: 5 % (ref 3–12)
NEUTROS ABS: 6.4 10*3/uL (ref 1.7–7.7)
Neutrophils Relative %: 68 % (ref 43–77)
PLATELETS: 245 10*3/uL (ref 150–400)
RBC: 4.59 MIL/uL (ref 4.22–5.81)
RDW: 15.6 % — AB (ref 11.5–15.5)
WBC: 9.4 10*3/uL (ref 4.0–10.5)

## 2014-03-16 LAB — BASIC METABOLIC PANEL
BUN: 45 mg/dL — ABNORMAL HIGH (ref 6–23)
CALCIUM: 10.1 mg/dL (ref 8.4–10.5)
CO2: 21 mEq/L (ref 19–32)
Chloride: 106 mEq/L (ref 96–112)
Creat: 1.79 mg/dL — ABNORMAL HIGH (ref 0.50–1.35)
GLUCOSE: 143 mg/dL — AB (ref 70–99)
POTASSIUM: 4.2 meq/L (ref 3.5–5.3)
Sodium: 138 mEq/L (ref 135–145)

## 2014-03-16 NOTE — Progress Notes (Signed)
Subjective:    Patient ID: Alexander BouquetRiyad Mcelhannon, male    DOB: [birth year], 58 y.o.   MRN: [id number]  HPI Patient was recently seen by his surgeon at [facility], Dr. Lowell GuitarPowell. EGD was performed which revealed peptic ulcers. Patient was started on pantoprazole 40 mg by mouth twice a day. It also seems that the patient has an esophageal stricture but from what I can gather from the note this was not corrected at that time.  Shortly after the EGD, the patient developed a cough. Over the next 3 days the cough became progressively worse. The patient became more lethargic. He developed a fever to 100.2. He was seen in the emergency room and diagnosed with lingular pneumonia. He was treated with Levaquin 750 mg by mouth daily for 10 days. The patient is here today for followup. His surgeon has recommended that the stricture be corrected to help prevent aspiration pneumonia.  Patient is waiting to be cleared medically from his pneumonia prior to scheduling a colonoscopy and also an EGD to dilate the stricture.  At the present time he is asymptomatic. His strength is returning to normal. He denies any fevers or chills. He denies any cough. He denies any shortness of breath. On examination today his lungs are clear to auscultation bilaterally breath sounds are normal. Past Medical History  Diagnosis Date  . Diabetes mellitus   . Hypertension   . Obesity   . Sleep apnea   . Gouty arthritis    Past Surgical History  Procedure Laterality Date  . Eye surgery    . Knee surgery    . Gastric bypass     Current Outpatient Prescriptions on File Prior to Visit  Medication Sig Dispense Refill  . ammonium lactate (LAC-HYDRIN) 12 % lotion Apply 1 application topically 2 (two) times daily as needed (legs).  400 g  2  . aspirin EC 81 MG tablet Take 81 mg by mouth daily.      . brinzolamide (AZOPT) 1 % ophthalmic suspension Place 1 drop into both eyes 2 (two) times daily.        . carvedilol (COREG) 3.125 MG tablet Take  3.125 mg by mouth 2 (two) times daily with a meal.      . docusate sodium (COLACE) 100 MG capsule Take 100 mg by mouth 2 (two) times daily.      . febuxostat (ULORIC) 40 MG tablet Take 40 mg by mouth daily.       Marland Kitchen. gabapentin (NEURONTIN) 300 MG capsule Take 300 mg by mouth daily.      Marland Kitchen. glucose blood (ACCU-CHEK SMARTVIEW) test strip 1 each by Other route See admin instructions. Check blood sugar 3 times daily.      . Insulin Glargine (LANTUS SOLOSTAR) 100 UNIT/ML Solostar Pen Inject 10 Units into the skin daily.      . Insulin Pen Needle 29G X 12.7MM MISC 1 each by Does not apply route See admin instructions. Use with lantus insulin pen daily.      Marland Kitchen. latanoprost (XALATAN) 0.005 % ophthalmic solution Place 1 drop into the left eye at bedtime.       . metFORMIN (GLUCOPHAGE) 500 MG tablet Take 500 mg by mouth 2 (two) times daily with a meal.      . Multiple Vitamin (MULTIVITAMIN) capsule Take 1 capsule by mouth 2 (two) times daily.      Marland Kitchen. OVER THE COUNTER MEDICATION Take 1 each by mouth 3 (three) times daily. "Calcium Citrate Chewy Bite  500-500"      . OVER THE COUNTER MEDICATION Take 1 tablet by mouth at bedtime. "Strawberry Flavored Chewable Iron with Vitamin C 18 mg / 30 mg"      . pantoprazole (PROTONIX) 40 MG tablet TAKE 1 TABLET BY MOUTH TWICE A DAY  60 tablet  11  . pantoprazole (PROTONIX) 40 MG tablet Take 40 mg by mouth 2 (two) times daily.      . polyethylene glycol (MIRALAX / GLYCOLAX) packet Take 17 g by mouth at bedtime.      . prednisoLONE acetate (PRED FORTE) 1 % ophthalmic suspension Place 1 drop into the right eye at bedtime.       . rosuvastatin (CRESTOR) 20 MG tablet Take 1 tablet (20 mg total) by mouth daily.  30 tablet  3  . timolol (TIMOPTIC) 0.5 % ophthalmic solution Place 1 drop into both eyes 2 (two) times daily.      . vitamin B-12 (CYANOCOBALAMIN) 1000 MCG tablet Take 1,000 mcg by mouth. (Chewable tablet)       No current facility-administered medications on file prior to  visit.   Allergies  Allergen Reactions  . Penicillins Swelling and Rash  . Amoxicillin Swelling and Rash  . Niacin Itching and Other (See Comments)    Other reaction(s): Flushing (ALLERGY/intolerance)  . Penicillin G Rash   History   Social History  . Marital Status: Married    Spouse Name: N/A    Number of Children: N/A  . Years of Education: N/A   Occupational History  . Not on file.   Social History Main Topics  . Smoking status: Never Smoker   . Smokeless tobacco: Never Used  . Alcohol Use: No  . Drug Use: No  . Sexual Activity: Not on file   Other Topics Concern  . Not on file   Social History Narrative  . No narrative on file   Family History  Problem Relation Age of Onset  . Hypertension Sister   . Diabetes Mellitus II Sister       Review of Systems  All other systems reviewed and are negative.      Objective:   Physical Exam  Vitals reviewed. Constitutional: He appears well-developed and well-nourished.  HENT:  Right Ear: External ear normal.  Left Ear: External ear normal.  Nose: Nose normal.  Mouth/Throat: Oropharynx is clear and moist. No oropharyngeal exudate.  Eyes: Conjunctivae are normal.  Neck: Neck supple.  Cardiovascular: Normal rate, regular rhythm and normal heart sounds.   No murmur heard. Pulmonary/Chest: Effort normal and breath sounds normal. No respiratory distress. He has no wheezes. He has no rales. He exhibits no tenderness.  Musculoskeletal: He exhibits no edema.  Lymphadenopathy:    He has no cervical adenopathy.          Assessment & Plan:  Hospital discharge follow-up - Plan: CBC with Differential, Basic Metabolic Panel  Clinically the patient's pneumonia has resolved. On a chest radiograph, pneumonias can persist for up to 4 weeks and therefore I do not feel that it is prudent to repeat a chest radiograph at the present time. This can be repeated in one month. However clinically the patient has completely healed  from his pneumonia. I believe he is medically stable to proceed with a screening colonoscopy. Also recommended he discuss an EGD and dilatation of the stricture with Dr. Lowell GuitarPowell.  He will call next week to schedule this.

## 2014-03-20 ENCOUNTER — Other Ambulatory Visit: Payer: Self-pay | Admitting: Family Medicine

## 2014-03-20 NOTE — Telephone Encounter (Signed)
Refill appropriate and filled per protocol. 

## 2014-03-23 ENCOUNTER — Other Ambulatory Visit: Payer: Medicaid Other

## 2014-03-23 NOTE — Telephone Encounter (Signed)
error 

## 2014-03-28 ENCOUNTER — Ambulatory Visit (INDEPENDENT_AMBULATORY_CARE_PROVIDER_SITE_OTHER): Payer: Medicaid Other | Admitting: Family Medicine

## 2014-03-28 ENCOUNTER — Ambulatory Visit
Admission: RE | Admit: 2014-03-28 | Discharge: 2014-03-28 | Disposition: A | Discharge status: Home / Self Care | Payer: Medicaid Other | Source: Ambulatory Visit | Attending: Family Medicine | Admitting: Family Medicine

## 2014-03-28 ENCOUNTER — Ambulatory Visit: Payer: Medicaid Other | Admitting: Family Medicine

## 2014-03-28 ENCOUNTER — Encounter: Payer: Self-pay | Admitting: Family Medicine

## 2014-03-28 ENCOUNTER — Telehealth: Payer: Self-pay | Admitting: Family Medicine

## 2014-03-28 VITALS — BP 118/68 | HR 62 | Temp 97.6°F | Resp 16 | Ht 68.0 in | Wt 300.0 lb

## 2014-03-28 DIAGNOSIS — R112 Nausea with vomiting, unspecified: Secondary | ICD-10-CM

## 2014-03-28 DIAGNOSIS — R1013 Epigastric pain: Secondary | ICD-10-CM

## 2014-03-28 MED ORDER — METOCLOPRAMIDE HCL 10 MG PO TABS: 10.0000 mg | ORAL_TABLET | Freq: Three times a day (TID) | ORAL | Status: DC

## 2014-03-28 NOTE — Telephone Encounter (Signed)
Patient aware of results and medication changes and will make appt tomorrow when he picks up samples.

## 2014-03-28 NOTE — Progress Notes (Signed)
Subjective:    Patient ID: Alexander BouquetRiyad Hubbard, male    DOB: 10-03-1955, [age] y.o.   MRN: [id number]  HPI Patient reports 2 weeks of worsening nausea and vomiting. Patient had gastric bypass approximately 6 months ago. He has had occasional vomiting over the X months but recently over the last 2 weeks it has worsened substantially. Patient states that whenever he eats, 30 minutes later he develops nonbilious vomiting. Over the last 2 days he has developed diffuse upper abdominal pain. He does report constipation. He is having very few bowel movements and when they are there extremely hard and he must strain to have bowel movements. He is extremely tender to palpation today in the right upper quadrant and in the epigastric area. He denies any melena or hematochezia. He denies any fever. He does have a history of an esophageal stricture. He has been told that he has an ulcer by his doctor at [facility]. He is currently on twice daily PPI. The symptoms are worsening on this medication. He has no history of gastroparesis to his knowledge. He still has his appendix and his gallbladder. He denies any jaundice. He denies any fever. Past Medical History  Diagnosis Date  . Diabetes mellitus   . Hypertension   . Obesity   . Sleep apnea   . Gouty arthritis    Past Surgical History  Procedure Laterality Date  . Eye surgery    . Knee surgery    . Gastric bypass     Current Outpatient Prescriptions on File Prior to Visit  Medication Sig Dispense Refill  . ammonium lactate (LAC-HYDRIN) 12 % lotion Apply 1 application topically 2 (two) times daily as needed (legs).  400 g  2  . aspirin EC 81 MG tablet Take 81 mg by mouth daily.      . brinzolamide (AZOPT) 1 % ophthalmic suspension Place 1 drop into both eyes 2 (two) times daily.        . carvedilol (COREG) 3.125 MG tablet Take 3.125 mg by mouth 2 (two) times daily with a meal.      . docusate sodium (COLACE) 100 MG capsule Take 100 mg by mouth 2 (two) times  daily.      . febuxostat (ULORIC) 40 MG tablet Take 40 mg by mouth daily.       Marland Kitchen. gabapentin (NEURONTIN) 300 MG capsule TAKE 2 CAPSULES BY MOUTH 3 TIMES A DAY  180 capsule  3  . glucose blood (ACCU-CHEK SMARTVIEW) test strip 1 each by Other route See admin instructions. Check blood sugar 3 times daily.      . Insulin Glargine (LANTUS SOLOSTAR) 100 UNIT/ML Solostar Pen Inject 10 Units into the skin daily.      . Insulin Pen Needle 29G X 12.7MM MISC 1 each by Does not apply route See admin instructions. Use with lantus insulin pen daily.      Marland Kitchen. latanoprost (XALATAN) 0.005 % ophthalmic solution Place 1 drop into the left eye at bedtime.       . metFORMIN (GLUCOPHAGE) 500 MG tablet Take 500 mg by mouth 2 (two) times daily with a meal.      . Multiple Vitamin (MULTIVITAMIN) capsule Take 1 capsule by mouth 2 (two) times daily.      Marland Kitchen. OVER THE COUNTER MEDICATION Take 1 each by mouth 3 (three) times daily. "Calcium Citrate Chewy Bite 500-500"      . OVER THE COUNTER MEDICATION Take 1 tablet by mouth at bedtime. "Strawberry Flavored  Chewable Iron with Vitamin C 18 mg / 30 mg"      . pantoprazole (PROTONIX) 40 MG tablet TAKE 1 TABLET BY MOUTH TWICE A DAY  60 tablet  11  . pantoprazole (PROTONIX) 40 MG tablet Take 40 mg by mouth 2 (two) times daily.      . polyethylene glycol (MIRALAX / GLYCOLAX) packet Take 17 g by mouth at bedtime.      . prednisoLONE acetate (PRED FORTE) 1 % ophthalmic suspension Place 1 drop into the right eye at bedtime.       . rosuvastatin (CRESTOR) 20 MG tablet Take 1 tablet (20 mg total) by mouth daily.  30 tablet  3  . timolol (TIMOPTIC) 0.5 % ophthalmic solution Place 1 drop into both eyes 2 (two) times daily.      . vitamin B-12 (CYANOCOBALAMIN) 1000 MCG tablet Take 1,000 mcg by mouth. (Chewable tablet)       No current facility-administered medications on file prior to visit.   Allergies  Allergen Reactions  . Penicillins Swelling and Rash  . Amoxicillin Swelling and Rash    . Niacin Itching and Other (See Comments)    Other reaction(s): Flushing (ALLERGY/intolerance)  . Penicillin G Rash   History   Social History  . Marital Status: Married    Spouse Name: N/A    Number of Children: N/A  . Years of Education: N/A   Occupational History  . Not on file.   Social History Main Topics  . Smoking status: Never Smoker   . Smokeless tobacco: Never Used  . Alcohol Use: No  . Drug Use: No  . Sexual Activity: Not on file   Other Topics Concern  . Not on file   Social History Narrative  . No narrative on file      Review of Systems  All other systems reviewed and are negative.      Objective:   Physical Exam  Vitals reviewed. Constitutional: He appears well-developed and well-nourished. No distress.  HENT:  Mouth/Throat: Oropharynx is clear and moist.  Neck: Neck supple. No JVD present.  Cardiovascular: Normal rate, regular rhythm and normal heart sounds.   No murmur heard. Pulmonary/Chest: Effort normal and breath sounds normal. No respiratory distress. He has no wheezes. He has no rales.  Abdominal: Soft. He exhibits distension. He exhibits no fluid wave, no ascites and no mass. Bowel sounds are increased. There is tenderness in the right upper quadrant and epigastric area. There is no rigidity, no guarding, no tenderness at McBurney's point and negative Murphy's sign.  Lymphadenopathy:    He has no cervical adenopathy.  Skin: He is not diaphoretic.          Assessment & Plan:  Non-intractable vomiting with nausea, vomiting of unspecified type - Plan: DG Abd Acute W/Chest  Epigastric pain  Differential diagnosis includes small bowel obstruction, ileus, severe constipation, gastroparesis, cholelithiasis, esophageal stricture. I will send the patient for an x-ray to rule out small bowel obstruction. If there is no evidence of a small bowel obstruction I would treat the patient's constipation and also add Reglan for possible  gastroparesis.  Meanwhile I would schedule the patient for right upper quadrant ultrasound to evaluate for cholelithiasis.

## 2014-03-28 NOTE — Telephone Encounter (Signed)
[phone]  Please call pt at above number once the xray results come in

## 2014-03-29 ENCOUNTER — Ambulatory Visit: Payer: Medicaid Other | Admitting: Endocrinology

## 2014-03-31 ENCOUNTER — Encounter: Payer: Self-pay | Admitting: Family Medicine

## 2014-03-31 ENCOUNTER — Ambulatory Visit (INDEPENDENT_AMBULATORY_CARE_PROVIDER_SITE_OTHER): Payer: Medicaid Other | Admitting: Family Medicine

## 2014-03-31 VITALS — BP 136/74 | HR 62 | Temp 98.8°F | Resp 16 | Ht 68.0 in | Wt 300.0 lb

## 2014-03-31 DIAGNOSIS — R1010 Upper abdominal pain, unspecified: Secondary | ICD-10-CM

## 2014-03-31 LAB — BASIC METABOLIC PANEL WITH GFR
BUN: 29 mg/dL — AB (ref 6–23)
CALCIUM: 8.8 mg/dL (ref 8.4–10.5)
CO2: 22 meq/L (ref 19–32)
Chloride: 107 mEq/L (ref 96–112)
Creat: 1.3 mg/dL (ref 0.50–1.35)
GFR, Est African American: 70 mL/min
GFR, Est Non African American: 60 mL/min
Glucose, Bld: 143 mg/dL — ABNORMAL HIGH (ref 70–99)
Potassium: 4.1 mEq/L (ref 3.5–5.3)
Sodium: 138 mEq/L (ref 135–145)

## 2014-03-31 NOTE — Progress Notes (Signed)
Subjective:    Patient ID: Alexander Hubbard, male    DOB: Oct 16, 1955, 58 y.o.   MRN: [id number]  HPI 03/28/14 Patient reports 2 weeks of worsening nausea and vomiting. Patient had gastric bypass approximately 6 months ago. He has had occasional vomiting over the X months but recently over the last 2 weeks it has worsened substantially. Patient states that whenever he eats, 30 minutes later he develops nonbilious vomiting. Over the last 2 days he has developed diffuse upper abdominal pain. He does report constipation. He is having very few bowel movements and when they are there extremely hard and he must strain to have bowel movements. He is extremely tender to palpation today in the right upper quadrant and in the epigastric area. He denies any melena or hematochezia. He denies any fever. He does have a history of an esophageal stricture. He has been told that he has an ulcer by his doctor at [facility]. He is currently on twice daily PPI. The symptoms are worsening on this medication. He has no history of gastroparesis to his knowledge. He still has his appendix and his gallbladder. He denies any jaundice. He denies any fever.  At that time, my plan was: Differential diagnosis includes small bowel obstruction, ileus, severe constipation, gastroparesis, cholelithiasis, esophageal stricture. I will send the patient for an x-ray to rule out small bowel obstruction. If there is no evidence of a small bowel obstruction I would treat the patient's constipation and also add Reglan for possible gastroparesis.  Meanwhile I would schedule the patient for right upper quadrant ultrasound to evaluate for cholelithiasis. 03/31/14 Patient is in today for recheck. Abdominal x-ray revealed a large stool burden but no evidence of small bowel obstruction. Therefore I started the patient on linzess 145 mcg poqday For chronic constipation and also start the patient on Reglan 10 mg by mouth every before meals at bedtime  for possible gastroparesis and nausea. The patient's symptoms are now completely better. He is no longer nauseated. His abdominal pain has subsided. Past Medical History  Diagnosis Date  . Diabetes mellitus   . Hypertension   . Obesity   . Sleep apnea   . Gouty arthritis    Past Surgical History  Procedure Laterality Date  . Eye surgery    . Knee surgery    . Gastric bypass     Current Outpatient Prescriptions on File Prior to Visit  Medication Sig Dispense Refill  . ammonium lactate (LAC-HYDRIN) 12 % lotion Apply 1 application topically 2 (two) times daily as needed (legs).  400 g  2  . aspirin EC 81 MG tablet Take 81 mg by mouth daily.      . brinzolamide (AZOPT) 1 % ophthalmic suspension Place 1 drop into both eyes 2 (two) times daily.        . carvedilol (COREG) 3.125 MG tablet Take 3.125 mg by mouth 2 (two) times daily with a meal.      . docusate sodium (COLACE) 100 MG capsule Take 100 mg by mouth 2 (two) times daily.      . febuxostat (ULORIC) 40 MG tablet Take 40 mg by mouth daily.       Marland Kitchen gabapentin (NEURONTIN) 300 MG capsule TAKE 2 CAPSULES BY MOUTH 3 TIMES A DAY  180 capsule  3  . glucose blood (ACCU-CHEK SMARTVIEW) test strip 1 each by Other route See admin instructions. Check blood sugar 3 times daily.      . Insulin Glargine (LANTUS SOLOSTAR) 100  UNIT/ML Solostar Pen Inject 10 Units into the skin daily.      . Insulin Pen Needle 29G X 12.7MM MISC 1 each by Does not apply route See admin instructions. Use with lantus insulin pen daily.      Marland Kitchen. latanoprost (XALATAN) 0.005 % ophthalmic solution Place 1 drop into the left eye at bedtime.       . metFORMIN (GLUCOPHAGE) 500 MG tablet Take 500 mg by mouth 2 (two) times daily with a meal.      . metoCLOPramide (REGLAN) 10 MG tablet Take 1 tablet (10 mg total) by mouth 3 (three) times daily before meals.  90 tablet  3  . Multiple Vitamin (MULTIVITAMIN) capsule Take 1 capsule by mouth 2 (two) times daily.      Marland Kitchen. OVER THE COUNTER  MEDICATION Take 1 each by mouth 3 (three) times daily. "Calcium Citrate Chewy Bite 500-500"      . OVER THE COUNTER MEDICATION Take 1 tablet by mouth at bedtime. "Strawberry Flavored Chewable Iron with Vitamin C 18 mg / 30 mg"      . pantoprazole (PROTONIX) 40 MG tablet TAKE 1 TABLET BY MOUTH TWICE A DAY  60 tablet  11  . pantoprazole (PROTONIX) 40 MG tablet Take 40 mg by mouth 2 (two) times daily.      . polyethylene glycol (MIRALAX / GLYCOLAX) packet Take 17 g by mouth at bedtime.      . prednisoLONE acetate (PRED FORTE) 1 % ophthalmic suspension Place 1 drop into the right eye at bedtime.       . rosuvastatin (CRESTOR) 20 MG tablet Take 1 tablet (20 mg total) by mouth daily.  30 tablet  3  . timolol (TIMOPTIC) 0.5 % ophthalmic solution Place 1 drop into both eyes 2 (two) times daily.      . vitamin B-12 (CYANOCOBALAMIN) 1000 MCG tablet Take 1,000 mcg by mouth. (Chewable tablet)       No current facility-administered medications on file prior to visit.   Allergies  Allergen Reactions  . Penicillins Swelling and Rash  . Amoxicillin Swelling and Rash  . Niacin Itching and Other (See Comments)    Other reaction(s): Flushing (ALLERGY/intolerance)  . Penicillin G Rash   History   Social History  . Marital Status: Married    Spouse Name: N/A    Number of Children: N/A  . Years of Education: N/A   Occupational History  . Not on file.   Social History Main Topics  . Smoking status: Never Smoker   . Smokeless tobacco: Never Used  . Alcohol Use: No  . Drug Use: No  . Sexual Activity: Not on file   Other Topics Concern  . Not on file   Social History Narrative  . No narrative on file      Review of Systems  All other systems reviewed and are negative.      Objective:   Physical Exam  Vitals reviewed. Constitutional: He appears well-developed and well-nourished. No distress.  HENT:  Mouth/Throat: Oropharynx is clear and moist.  Neck: Neck supple. No JVD present.    Cardiovascular: Normal rate, regular rhythm and normal heart sounds.   No murmur heard. Pulmonary/Chest: Effort normal and breath sounds normal. No respiratory distress. He has no wheezes. He has no rales.  Abdominal: Soft. Bowel sounds are normal. He exhibits no distension, no fluid wave, no ascites and no mass. There is no tenderness. There is no rigidity, no guarding, no tenderness at McBurney's point  and negative Murphy's sign.  Lymphadenopathy:    He has no cervical adenopathy.  Skin: He is not diaphoretic.          Assessment & Plan:  Pain of upper abdomen - Plan: BASIC METABOLIC PANEL WITH GFR  Believe the majority of the patient's symptoms were due to severe constipation. I want him to continue linzess 145 g by mouth daily until the samples run out. I gave him enough medication to last 10 days. I want him to discontinue the Reglan. If his symptoms return she evaluated with a gastric emptying study to evaluate for gastroparesis.

## 2014-04-01 ENCOUNTER — Other Ambulatory Visit: Payer: Self-pay | Admitting: Family Medicine

## 2014-04-03 NOTE — Telephone Encounter (Signed)
Medication refilled per protocol. 

## 2014-04-05 ENCOUNTER — Telehealth: Payer: Self-pay | Admitting: Family Medicine

## 2014-04-05 ENCOUNTER — Other Ambulatory Visit: Payer: Self-pay | Admitting: Family Medicine

## 2014-04-05 NOTE — Telephone Encounter (Signed)
Patient is calling about lab results  [phone]

## 2014-04-05 NOTE — Telephone Encounter (Signed)
Patient aware of results.

## 2014-04-07 ENCOUNTER — Telehealth: Payer: Self-pay | Admitting: Family Medicine

## 2014-04-07 MED ORDER — LINACLOTIDE 145 MCG PO CAPS: 145.0000 ug | ORAL_CAPSULE | Freq: Every day | ORAL | Status: DC

## 2014-04-07 NOTE — Telephone Encounter (Signed)
Pt needed Linzess NOT Lantus. Will send in rx

## 2014-04-07 NOTE — Telephone Encounter (Signed)
[facility]  Patient is calling to say that the samples of lantus we gave him are working well would like a rx for this called in if possible    [phone]

## 2014-04-07 NOTE — Telephone Encounter (Signed)
Med sent to pharm 

## 2014-04-14 ENCOUNTER — Encounter: Payer: Self-pay | Admitting: Cardiology

## 2014-04-14 ENCOUNTER — Ambulatory Visit (INDEPENDENT_AMBULATORY_CARE_PROVIDER_SITE_OTHER): Payer: Medicaid Other | Admitting: Cardiology

## 2014-04-14 VITALS — BP 128/78 | HR 63 | Ht 70.0 in | Wt 295.0 lb

## 2014-04-14 DIAGNOSIS — I10 Essential (primary) hypertension: Secondary | ICD-10-CM

## 2014-04-14 DIAGNOSIS — N5201 Erectile dysfunction due to arterial insufficiency: Secondary | ICD-10-CM

## 2014-04-14 DIAGNOSIS — R634 Abnormal weight loss: Secondary | ICD-10-CM

## 2014-04-14 DIAGNOSIS — G4733 Obstructive sleep apnea (adult) (pediatric): Secondary | ICD-10-CM

## 2014-04-14 MED ORDER — SILDENAFIL CITRATE 100 MG PO TABS: 100.0000 mg | ORAL_TABLET | Freq: Every day | ORAL | Status: DC | PRN

## 2014-04-14 NOTE — Progress Notes (Signed)
[address] [place], [place]  [address] Phone: [phone] Fax:  [phone]  Date:  04/14/2014   ID:  Alexander Hubbard Esther, DOB 1955/11/12, MRN [id number]  PCP:  Leo GrosserPICKARD,WARREN TOM, MD   History of Present Illness: Alexander Hubbard is a 58 y.o. male here for follow up post weight reduction surgery. Has lost over 100 pounds. Having issues with emesis. Going forward with endoscopy soon.   He has a history of morbid obesity, hypertension, diabetes, chronic kidney disease, chronic diastolic heart failure, noncompliant obstructive sleep apnea CPAP.  EF probably 45%. NUC stress 4/13 low risk (reduced sensitivity due to attenuation artifact).  Currently he feels at baseline. No significant chest pain. No significant shortness of breath, in fact he feels better from a breathing perspective as he has in a long time.    Wt Readings from Last 3 Encounters:  04/14/14 295 lb (133.811 kg)  03/31/14 300 lb (136.079 kg)  03/28/14 300 lb (136.079 kg)     Past Medical History  Diagnosis Date  . Diabetes mellitus   . Hypertension   . Obesity   . Sleep apnea   . Gouty arthritis     Past Surgical History  Procedure Laterality Date  . Eye surgery    . Knee surgery    . Gastric bypass      Current Outpatient Prescriptions  Medication Sig Dispense Refill  . ammonium lactate (LAC-HYDRIN) 12 % lotion Apply 1 application topically 2 (two) times daily as needed (legs). 400 g 2  . aspirin EC 81 MG tablet Take 81 mg by mouth daily.    . brinzolamide (AZOPT) 1 % ophthalmic suspension Place 1 drop into both eyes 2 (two) times daily.      . carvedilol (COREG) 3.125 MG tablet Take 3.125 mg by mouth 2 (two) times daily with a meal.    . colchicine 0.6 MG tablet TAKE 1 TABLET BY MOUTH EVERY DAY 30 tablet 5  . docusate sodium (COLACE) 100 MG capsule Take 100 mg by mouth 2 (two) times daily.    Marland Kitchen. gabapentin (NEURONTIN) 300 MG capsule TAKE 2 CAPSULES BY MOUTH 3 TIMES A DAY 180 capsule 3  .  glucose blood (ACCU-CHEK SMARTVIEW) test strip 1 each by Other route See admin instructions. Check blood sugar 3 times daily.    Marland Kitchen. latanoprost (XALATAN) 0.005 % ophthalmic solution Place 1 drop into the left eye at bedtime.     . Linaclotide (LINZESS) 145 MCG CAPS capsule Take 1 capsule (145 mcg total) by mouth daily. 30 capsule 11  . metFORMIN (GLUCOPHAGE) 500 MG tablet Take 500 mg by mouth 2 (two) times daily with a meal.    . Multiple Vitamin (MULTIVITAMIN) capsule Take 1 capsule by mouth 2 (two) times daily.    Marland Kitchen. OVER THE COUNTER MEDICATION Take 1 each by mouth 3 (three) times daily. "Calcium Citrate Chewy Bite 500-500"    . OVER THE COUNTER MEDICATION Take 1 tablet by mouth at bedtime. "Strawberry Flavored Chewable Iron with Vitamin C 18 mg / 30 mg"    . pantoprazole (PROTONIX) 40 MG tablet TAKE 1 TABLET BY MOUTH TWICE A DAY 60 tablet 11  . polyethylene glycol (MIRALAX / GLYCOLAX) packet Take 17 g by mouth at bedtime.    . prednisoLONE acetate (PRED FORTE) 1 % ophthalmic suspension Place 1 drop into the right eye at bedtime.     . rosuvastatin (CRESTOR) 20 MG tablet Take 1 tablet (20 mg total)  by mouth daily. 30 tablet 3  . timolol (TIMOPTIC) 0.5 % ophthalmic solution Place 1 drop into both eyes 2 (two) times daily.    Marland Kitchen. ULORIC 40 MG tablet TAKE 1 TABLET BY MOUTH EVERY DAY 30 tablet 5  . vitamin B-12 (CYANOCOBALAMIN) 1000 MCG tablet Take 1,000 mcg by mouth. (Chewable tablet)     No current facility-administered medications for this visit.    Allergies:    Allergies  Allergen Reactions  . Penicillins Swelling and Rash  . Amoxicillin Swelling and Rash  . Niacin Itching and Other (See Comments)    Other reaction(s): Flushing (ALLERGY/intolerance)  . Penicillin G Rash    Social History:  The patient  reports that he has never smoked. He has never used smokeless tobacco. He reports that he does not drink alcohol or use illicit drugs.   ROS:  Please see the history of present illness.     Denies any chest discomfort, no significant increase in shortness of breath, positive for isolated syncopal episode as described above, no rashes, no fevers, no cough.    PHYSICAL EXAM: VS:  BP 128/78 mmHg  Pulse 63  Ht 5\' 10"  (1.778 m)  Wt 295 lb (133.811 kg)  BMI 42.33 kg/m2 Well nourished, well developed, in no acute distress HEENT: normal Neck: no JVD, very thick Cardiac:  normal S1, S2; RRR; soft systolic murmurRight upper sternal border, distant heart sounds due to to body habitus Lungs:  clear to auscultation bilaterally, no wheezing, rhonchi or rales Abd: soft, nontender, no hepatomegalyMorbid obesity Ext: no edema Skin: warm and dry Neuro: no focal abnormalities noted  EKG:    04/14/14-normal sinus rhythm, 66, right bundle branch block.  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 Weight loss-significant weight loss post gastric surgery. I will check an EKG to make sure electrically, normal. RBBB no change. 2. Morbid obesity- 295 from 408. wonderful 3. Hypertension-currently controlled. 4. Diabetes-per primary team. 5. Obstructive sleep apnea-encourage use of CPAP. 6. Chronic kidney disease stage III-he is seeing nephrology. 7. Erectile dysfunction-I'm fine with him utilizing Viagra 100 mg. Avoid nitrates. 8. We will see back in 3 months.  Signed, Donato SchultzMark Skains, MD [facility]  04/14/2014 2:21 PM

## 2014-04-14 NOTE — Patient Instructions (Signed)
The current medical regimen is effective;  continue present plan and medications.  Follow up in 3 months with Dr Skains.   

## 2014-04-23 IMAGING — CR DG CHEST 1V PORT
1 series · 1 of 1 positions shown · non-contrast
Comparison: Prior radiograph from 09/21/2012

CLINICAL DATA: Syncope

EXAM:
PORTABLE CHEST - 1 VIEW

[AP]
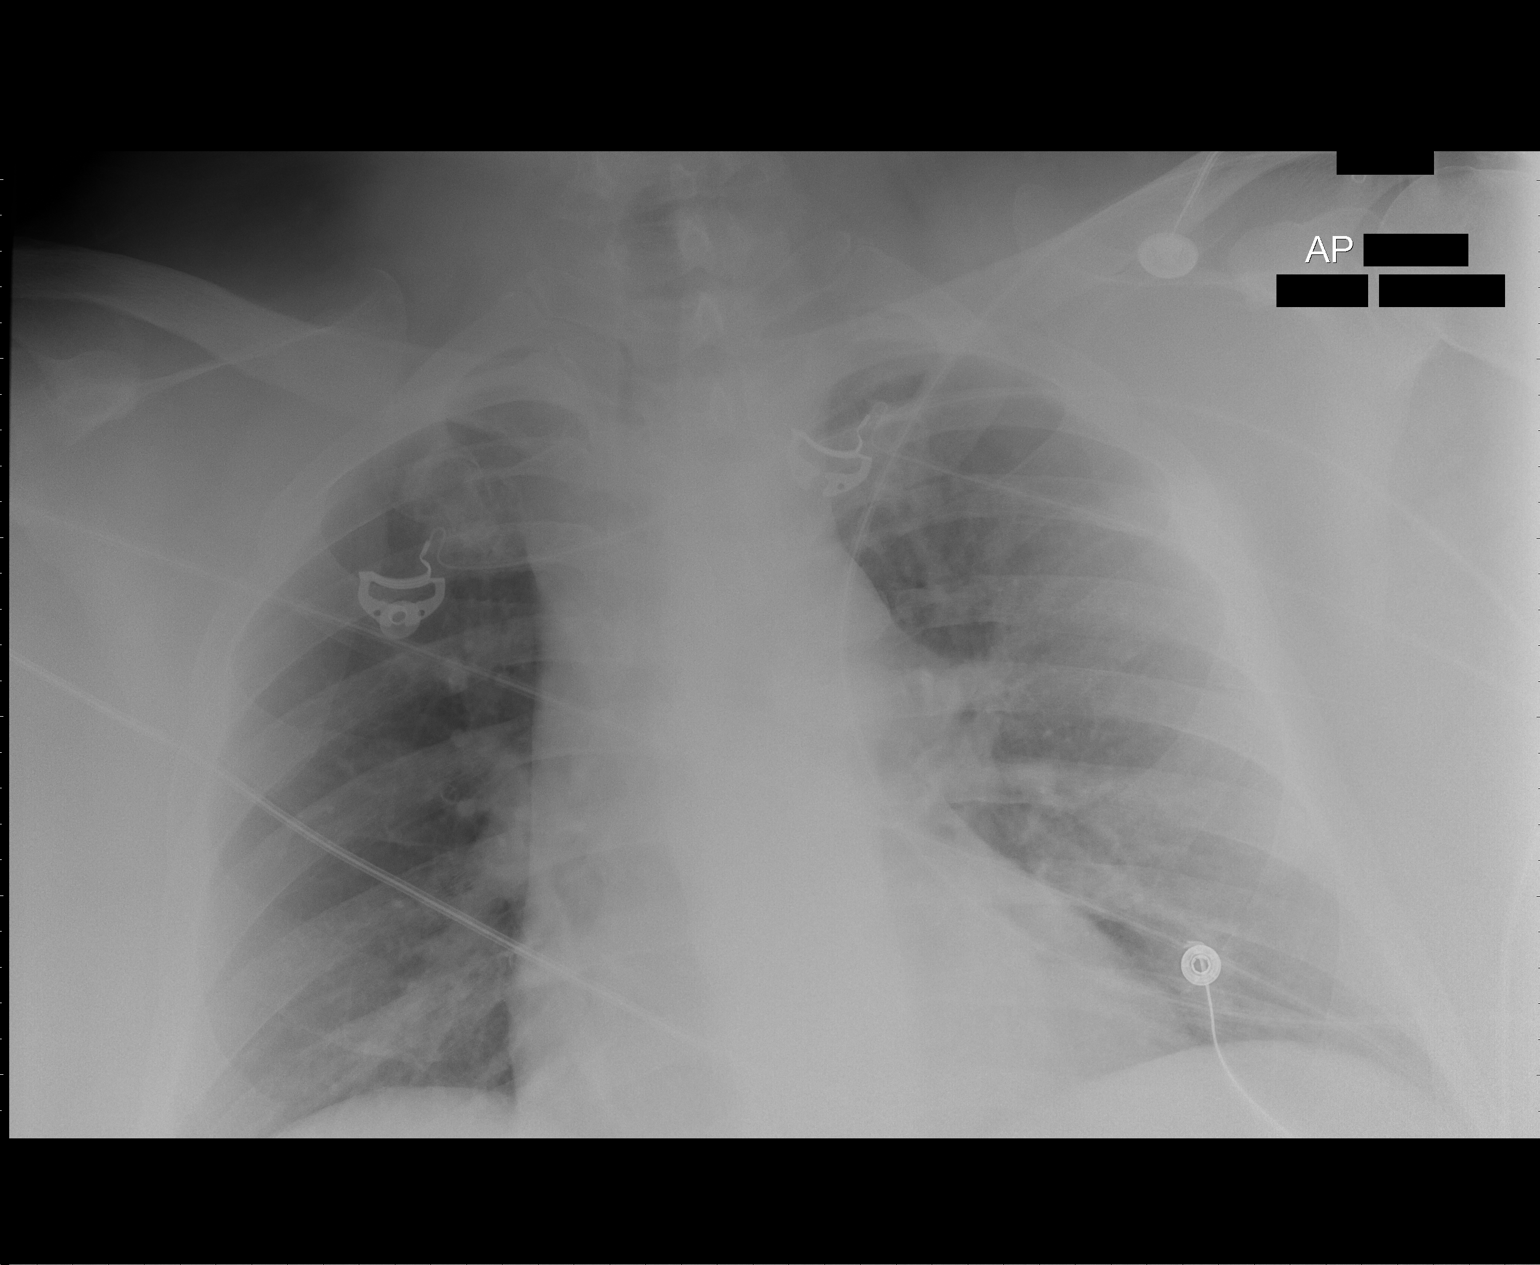

[1 of 1 positions shown; findings below may reference images not displayed]

FINDINGS: Cardiomegaly is stable as compared to prior study.

The lungs are normally inflated. No airspace consolidation, pleural
effusion, or pulmonary edema is identified. There is no
pneumothorax.

No acute osseous abnormality identified.
IMPRESSION: Stable cardiomegaly without evidence of pulmonary edema or other
acute abnormality.

## 2014-05-04 ENCOUNTER — Ambulatory Visit (INDEPENDENT_AMBULATORY_CARE_PROVIDER_SITE_OTHER): Payer: Medicaid Other | Admitting: Podiatrist

## 2014-05-04 ENCOUNTER — Encounter: Payer: Self-pay | Admitting: Podiatrist

## 2014-05-04 DIAGNOSIS — M79609 Pain in unspecified limb: Principal | ICD-10-CM

## 2014-05-04 DIAGNOSIS — M79673 Pain in unspecified foot: Secondary | ICD-10-CM

## 2014-05-04 DIAGNOSIS — G629 Polyneuropathy, unspecified: Secondary | ICD-10-CM

## 2014-05-04 DIAGNOSIS — B351 Tinea unguium: Secondary | ICD-10-CM

## 2014-05-04 NOTE — Patient Instructions (Signed)
CETAPHIL CREAM  Is my favorite over the counter moisturizer.  Apply it 2-3 times a day for dry skin.

## 2014-05-04 NOTE — Progress Notes (Signed)
Subjective: Patient presents today with his wife for foot and nail care. The patient relatesthe no new lesions or  Ulcers.  He recently underwent weight loss surgery and has lost close to 100 pounds so far. Patient also has painful thickened toenails of which he would like debrided at today's visit as well.  Objective: Neurovascular status is unchanged with neuropathy present and faintly palpable pedal pulses bilateral. The patient has absent hair growth and capillary refill time which is decreased bilateral. Patient's toenails are elongated, thickened and dystrophic.  Assessment: symptomatic mycotic nails , diabetes, neuropathy, angiopathy,   Plan: toenails debrided today without complication. Recommended diabetic shoes and wrote a rx for them so he can get them at a medical supply store he has gotten shoes from in the past.  He will be seen back in 3 months or as needed.

## 2014-05-05 ENCOUNTER — Encounter: Payer: Self-pay | Admitting: Family Medicine

## 2014-05-05 ENCOUNTER — Ambulatory Visit (INDEPENDENT_AMBULATORY_CARE_PROVIDER_SITE_OTHER): Payer: Medicaid Other | Admitting: Family Medicine

## 2014-05-05 VITALS — BP 118/68 | HR 68 | Temp 98.0°F | Resp 18 | Ht 68.0 in | Wt 290.0 lb

## 2014-05-05 DIAGNOSIS — H73011 Bullous myringitis, right ear: Secondary | ICD-10-CM

## 2014-05-05 MED ORDER — ANTIPYRINE-BENZOCAINE 5.4-1.4 % OT SOLN: 3.0000 [drp] | OTIC | Status: DC | PRN

## 2014-05-05 MED ORDER — AZITHROMYCIN 250 MG PO TABS: ORAL_TABLET | ORAL | Status: DC

## 2014-05-05 NOTE — Progress Notes (Signed)
Subjective:    Patient ID: Alexander Hubbard, male    DOB: 05-Nov-1955, 58 y.o.   MRN: [id number]  HPI   patient awoke this morning with severe right ear pain deep inside. The pain was sharp and intense. He denies any recent viral infection. He denies any fever , rhinorrhea, sore throat, or cough. On examination today his right tympanic membrane is erythematous and dull with effusion. There are also 2 small vesicles on the lower portion of the eardrum consistent with bullous myringitis. Past Medical History  Diagnosis Date  . Diabetes mellitus   . Hypertension   . Obesity   . Sleep apnea   . Gouty arthritis    Past Surgical History  Procedure Laterality Date  . Eye surgery    . Knee surgery    . Gastric bypass     Current Outpatient Prescriptions on File Prior to Visit  Medication Sig Dispense Refill  . ammonium lactate (LAC-HYDRIN) 12 % lotion Apply 1 application topically 2 (two) times daily as needed (legs). 400 g 2  . aspirin EC 81 MG tablet Take 81 mg by mouth daily.    . brinzolamide (AZOPT) 1 % ophthalmic suspension Place 1 drop into both eyes 2 (two) times daily.      . carvedilol (COREG) 3.125 MG tablet Take 3.125 mg by mouth 2 (two) times daily with a meal.    . colchicine 0.6 MG tablet TAKE 1 TABLET BY MOUTH EVERY DAY 30 tablet 5  . docusate sodium (COLACE) 100 MG capsule Take 100 mg by mouth 2 (two) times daily.    Marland Kitchen. gabapentin (NEURONTIN) 300 MG capsule TAKE 2 CAPSULES BY MOUTH 3 TIMES A DAY 180 capsule 3  . glucose blood (ACCU-CHEK SMARTVIEW) test strip 1 each by Other route See admin instructions. Check blood sugar 3 times daily.    Marland Kitchen. latanoprost (XALATAN) 0.005 % ophthalmic solution Place 1 drop into the left eye at bedtime.     . Linaclotide (LINZESS) 145 MCG CAPS capsule Take 1 capsule (145 mcg total) by mouth daily. 30 capsule 11  . metFORMIN (GLUCOPHAGE) 500 MG tablet Take 500 mg by mouth 2 (two) times daily with a meal.    . Multiple Vitamin (MULTIVITAMIN) capsule  Take 1 capsule by mouth 2 (two) times daily.    Marland Kitchen. OVER THE COUNTER MEDICATION Take 1 each by mouth 3 (three) times daily. "Calcium Citrate Chewy Bite 500-500"    . OVER THE COUNTER MEDICATION Take 1 tablet by mouth at bedtime. "Strawberry Flavored Chewable Iron with Vitamin C 18 mg / 30 mg"    . pantoprazole (PROTONIX) 40 MG tablet TAKE 1 TABLET BY MOUTH TWICE A DAY 60 tablet 11  . polyethylene glycol (MIRALAX / GLYCOLAX) packet Take 17 g by mouth at bedtime.    . prednisoLONE acetate (PRED FORTE) 1 % ophthalmic suspension Place 1 drop into the right eye at bedtime.     . rosuvastatin (CRESTOR) 20 MG tablet Take 1 tablet (20 mg total) by mouth daily. 30 tablet 3  . sildenafil (VIAGRA) 100 MG tablet Take 1 tablet (100 mg total) by mouth daily as needed for erectile dysfunction. 6 tablet 12  . timolol (TIMOPTIC) 0.5 % ophthalmic solution Place 1 drop into both eyes 2 (two) times daily.    Marland Kitchen. ULORIC 40 MG tablet TAKE 1 TABLET BY MOUTH EVERY DAY 30 tablet 5  . vitamin B-12 (CYANOCOBALAMIN) 1000 MCG tablet Take 1,000 mcg by mouth. (Chewable tablet)  No current facility-administered medications on file prior to visit.   Allergies  Allergen Reactions  . Penicillins Swelling and Rash  . Amoxicillin Swelling and Rash  . Niacin Itching and Other (See Comments)    Other reaction(s): Flushing (ALLERGY/intolerance)  . Penicillin G Rash   History   Social History  . Marital Status: Married    Spouse Name: N/A    Number of Children: N/A  . Years of Education: N/A   Occupational History  . Not on file.   Social History Main Topics  . Smoking status: Never Smoker   . Smokeless tobacco: Never Used  . Alcohol Use: No  . Drug Use: No  . Sexual Activity: Not on file   Other Topics Concern  . Not on file   Social History Narrative     Review of Systems  All other systems reviewed and are negative.      Objective:   Physical Exam  HENT:  Right Ear: External ear normal. Tympanic  membrane is injected, erythematous and bulging.  Left Ear: External ear normal.  Nose: Nose normal.  Mouth/Throat: Oropharynx is clear and moist.  Eyes: Conjunctivae are normal. Right eye exhibits no discharge. Left eye exhibits no discharge. No scleral icterus.  Neck: Neck supple. No JVD present.  Cardiovascular: Normal rate, regular rhythm and normal heart sounds.   Pulmonary/Chest: Effort normal and breath sounds normal.  Lymphadenopathy:    He has no cervical adenopathy.  Vitals reviewed.         Assessment & Plan:  Bullous myringitis of right ear - Plan: azithromycin (ZITHROMAX) 250 MG tablet, antipyrine-benzocaine (AURALGAN) otic solution   Patient is allergic to penicillin. Therefore I'm going to start the patient on Zithromax 500 mg by mouth day one, 250 mg by mouth days 2 through 5. Also give the patient prescription for Auralgan eardrops 3-4 drops in the right ear canal every  4-6 hours as needed for pain.

## 2014-05-08 ENCOUNTER — Telehealth: Payer: Self-pay | Admitting: *Deleted

## 2014-05-08 NOTE — Telephone Encounter (Signed)
Pt called stating that he was here on Friday and was given antibiotic for his ears, states he is no better and would like to know if you could prescribe him some ear gtts, [place] and would like to feel  Better by then. MD please advise  [facility]

## 2014-05-09 MED ORDER — NEOMYCIN-POLYMYXIN-HC 3.5-10000-1 OT SOLN: 4.0000 [drp] | Freq: Four times a day (QID) | OTIC | Status: DC

## 2014-05-09 NOTE — Telephone Encounter (Signed)
Try adding cortisporin HC otic 4 gtts qid for 1 week, NTBS if no better in 48 hrs.

## 2014-05-09 NOTE — Telephone Encounter (Signed)
Prescription filled and pt aware of instruction

## 2014-05-11 ENCOUNTER — Ambulatory Visit: Payer: Medicaid Other | Admitting: Podiatrist

## 2014-05-12 ENCOUNTER — Telehealth: Payer: Self-pay | Admitting: *Deleted

## 2014-05-12 NOTE — Telephone Encounter (Signed)
We have a prescription here on a patient about Diabetic Shoes.  We cannot read the name on the prescription nor do we have any demographic information.  I can try and spell it for you."  I could find the patient, I asked her to send over a copy of the prescription to us.  "Okay I will."  I sent back a fax with the patient's demographics and a copy of the prescription.

## 2014-05-24 ENCOUNTER — Telehealth: Payer: Self-pay | Admitting: Family Medicine

## 2014-05-24 NOTE — Telephone Encounter (Signed)
[phone] Patient is calling to see if can get rx for diabetic shoes [facility]

## 2014-05-30 ENCOUNTER — Encounter: Payer: Self-pay | Admitting: Family Medicine

## 2014-05-30 NOTE — Telephone Encounter (Signed)
[phone] Patient is calling to see if can get rx for diabetic shoes to [facility]

## 2014-06-02 ENCOUNTER — Other Ambulatory Visit: Payer: Self-pay | Admitting: Endocrinology

## 2014-06-02 ENCOUNTER — Other Ambulatory Visit: Payer: Self-pay | Admitting: Family Medicine

## 2014-06-06 ENCOUNTER — Telehealth: Payer: Self-pay | Admitting: Family Medicine

## 2014-06-06 NOTE — Telephone Encounter (Signed)
[phone]  PT states he is at pharmacy and they don't have his rx for his diabetic shoes

## 2014-06-12 MED ORDER — UNABLE TO FIND: Status: AC

## 2014-06-12 NOTE — Telephone Encounter (Signed)
This encounter was created in error - please disregard.

## 2014-06-12 NOTE — Telephone Encounter (Signed)
Pt was given RX from Diabetic shoes from Podiatrist at December visit?

## 2014-06-12 NOTE — Telephone Encounter (Signed)
Diabetic foot exam printed form Foot doctor.  Pt states Medicaid needs Rx from PCP.  Rx printed and sent to pharmacy with foot exam attached

## 2014-06-29 ENCOUNTER — Other Ambulatory Visit: Payer: Self-pay | Admitting: Endocrinology

## 2014-06-29 ENCOUNTER — Telehealth: Payer: Self-pay | Admitting: Endocrinology

## 2014-06-29 ENCOUNTER — Telehealth: Payer: Self-pay | Admitting: *Deleted

## 2014-06-29 NOTE — Telephone Encounter (Signed)
Pt meter is broken he is coming tomorrow for labs can he pick up a new one tomorrow when he comes

## 2014-06-29 NOTE — Telephone Encounter (Signed)
Pt called inquiring about his diabetic shoes says that [facility] has never received them and wants to know what is going on. I called [facility] spoke with Tyler Aasoris and she stated the only thing she received was a script from the podiatrist with the foot exam but could not accept that d/t pt has Medicaid and needs to be coming from PCP. Doris says that we need to start the process over again with a prescription for diabetic shoes from PCP, foot exam coming from PCP and there is a form to go along with the visit which will be sending to my attention. Pt is coming in tomorrow to see Dr. Tanya NonesPickard and Tyler Aasoris suggested maybe doing the exam at that time. I called and spoke to pt informing him that we need to do the process over and when comes in for visit we can do foot exam at that time.

## 2014-06-29 NOTE — Telephone Encounter (Signed)
Yes, please let me know when he gets here

## 2014-06-30 ENCOUNTER — Ambulatory Visit (INDEPENDENT_AMBULATORY_CARE_PROVIDER_SITE_OTHER): Payer: Medicaid Other | Admitting: Family Medicine

## 2014-06-30 ENCOUNTER — Encounter: Payer: Self-pay | Admitting: Family Medicine

## 2014-06-30 ENCOUNTER — Other Ambulatory Visit: Payer: Medicaid Other

## 2014-06-30 VITALS — BP 154/86 | HR 64 | Temp 98.0°F | Resp 18 | Wt 284.0 lb

## 2014-06-30 DIAGNOSIS — E119 Type 2 diabetes mellitus without complications: Secondary | ICD-10-CM

## 2014-06-30 DIAGNOSIS — M79642 Pain in left hand: Secondary | ICD-10-CM

## 2014-06-30 DIAGNOSIS — M79641 Pain in right hand: Secondary | ICD-10-CM

## 2014-06-30 DIAGNOSIS — J069 Acute upper respiratory infection, unspecified: Secondary | ICD-10-CM

## 2014-06-30 MED ORDER — DICLOFENAC SODIUM 1 % TD GEL: 2.0000 g | Freq: Four times a day (QID) | TRANSDERMAL | Status: DC

## 2014-06-30 NOTE — Progress Notes (Signed)
Subjective:    Patient ID: Alexander Hubbard, male    DOB: 1956-05-30, 59 y.o.   MRN: [id number]  HPI Patient is here today for diabetic foot exam. He has severe peripheral neuropathy in both feet. He has no sensation in both feet. He also has a history of pre-ulcerative calluses. On examination today he has a callus over the lateral aspect of the first left MTP joint. He also complains of a four-day history of a cold. He reports rhinorrhea, postnasal drip, thick mucus hanging in his throat which chokes him, and a nonproductive cough. He also complains of stiffness in the MCP joints and PIP joints on both hands. He is unable to take NSAIDs due to chronic kidney disease. Past Medical History  Diagnosis Date  . Diabetes mellitus   . Hypertension   . Obesity   . Sleep apnea   . Gouty arthritis    Past Surgical History  Procedure Laterality Date  . Eye surgery    . Knee surgery    . Gastric bypass     Current Outpatient Prescriptions on File Prior to Visit  Medication Sig Dispense Refill  . ammonium lactate (LAC-HYDRIN) 12 % lotion Apply 1 application topically 2 (two) times daily as needed (legs). 400 g 2  . aspirin EC 81 MG tablet Take 81 mg by mouth daily.    . brinzolamide (AZOPT) 1 % ophthalmic suspension Place 1 drop into both eyes 2 (two) times daily.      . carvedilol (COREG) 3.125 MG tablet Take 3.125 mg by mouth 2 (two) times daily with a meal.    . colchicine 0.6 MG tablet TAKE 1 TABLET BY MOUTH EVERY DAY 30 tablet 5  . CRESTOR 20 MG tablet TAKE 1 TABLET (20 MG TOTAL) BY MOUTH DAILY. 30 tablet 3  . docusate sodium (COLACE) 100 MG capsule Take 100 mg by mouth 2 (two) times daily.    Marland Kitchen glucose blood (ACCU-CHEK SMARTVIEW) test strip 1 each by Other route See admin instructions. Check blood sugar 3 times daily.    Marland Kitchen latanoprost (XALATAN) 0.005 % ophthalmic solution Place 1 drop into the left eye at bedtime.     . Linaclotide (LINZESS) 145 MCG CAPS capsule Take 1 capsule (145 mcg total)  by mouth daily. 30 capsule 11  . metFORMIN (GLUCOPHAGE) 500 MG tablet TAKE 1 TABLET BY MOUTH 2 TIMES A DAY WITH A MEAL 60 tablet 0  . Multiple Vitamin (MULTIVITAMIN) capsule Take 1 capsule by mouth 2 (two) times daily.    Marland Kitchen OVER THE COUNTER MEDICATION Take 1 each by mouth 3 (three) times daily. "Calcium Citrate Chewy Bite 500-500"    . OVER THE COUNTER MEDICATION Take 1 tablet by mouth at bedtime. "Strawberry Flavored Chewable Iron with Vitamin C 18 mg / 30 mg"    . pantoprazole (PROTONIX) 40 MG tablet TAKE 1 TABLET BY MOUTH TWICE A DAY 60 tablet 11  . polyethylene glycol (MIRALAX / GLYCOLAX) packet Take 17 g by mouth at bedtime.    . prednisoLONE acetate (PRED FORTE) 1 % ophthalmic suspension Place 1 drop into the right eye at bedtime.     . sildenafil (VIAGRA) 100 MG tablet Take 1 tablet (100 mg total) by mouth daily as needed for erectile dysfunction. 6 tablet 12  . timolol (TIMOPTIC) 0.5 % ophthalmic solution Place 1 drop into both eyes 2 (two) times daily.    Marland Kitchen ULORIC 40 MG tablet TAKE 1 TABLET BY MOUTH EVERY DAY 30 tablet 5  .  UNABLE TO FIND Dispense one pair of Diabetic Shoes. ICD E11.49, G62.9, B35.1, R60.9 1 each 0  . vitamin B-12 (CYANOCOBALAMIN) 1000 MCG tablet Take 1,000 mcg by mouth. (Chewable tablet)    . gabapentin (NEURONTIN) 300 MG capsule TAKE 2 CAPSULES BY MOUTH 3 TIMES A DAY (Patient not taking: Reported on 06/30/2014) 180 capsule 3  . NEOMYCIN-POLYMYXIN-HYDROCORTISONE (CORTISPORIN) 1 % SOLN otic solution PLACE 4 DROPS INTO BOTH EARS 4 (FOUR) TIMES DAILY. 10 mL 0   No current facility-administered medications on file prior to visit.   Allergies  Allergen Reactions  . Penicillins Swelling and Rash  . Amoxicillin Swelling and Rash  . Niacin Itching and Other (See Comments)    Other reaction(s): Flushing (ALLERGY/intolerance)  . Penicillin G Rash   History   Social History  . Marital Status: Married    Spouse Name: N/A    Number of Children: N/A  . Years of Education:  N/A   Occupational History  . Not on file.   Social History Main Topics  . Smoking status: Never Smoker   . Smokeless tobacco: Never Used  . Alcohol Use: No  . Drug Use: No  . Sexual Activity: Not on file   Other Topics Concern  . Not on file   Social History Narrative      Review of Systems  All other systems reviewed and are negative.      Objective:   Physical Exam  Constitutional: He appears well-developed and well-nourished.  HENT:  Right Ear: External ear normal.  Left Ear: External ear normal.  Nose: Nose normal.  Mouth/Throat: Oropharynx is clear and moist. No oropharyngeal exudate.  Eyes: Conjunctivae are normal.  Neck: Neck supple. No thyromegaly present.  Cardiovascular: Normal rate, regular rhythm, normal heart sounds and intact distal pulses.   No murmur heard. Pulmonary/Chest: Effort normal and breath sounds normal. No respiratory distress. He has no wheezes. He has no rales. He exhibits no tenderness.  Musculoskeletal: He exhibits edema.       Right hand: Normal.       Left hand: Normal.  Lymphadenopathy:    He has no cervical adenopathy.  Vitals reviewed.         Assessment & Plan:  Diabetes mellitus type II, controlled  Acute URI  Bilateral hand pain  Patient has a viral upper respiratory infection. I recommended tincture of time. I anticipate spontaneous resolution over the next 3-4 days. Patient can use Coricidin HBP for congestion and Mucinex for cough. Also recommended a humidifier next to his bed at night to help loosen the mucus in his chest. I believe the patient has hand pain due to early arthritis in his hands. Patient can use voltaren gel 4 times a day as needed for stiffness and pain. He also has severe peripheral neuropathy and arthritis for diabetic shoes. I will send this to his pharmacy.

## 2014-07-03 ENCOUNTER — Other Ambulatory Visit (INDEPENDENT_AMBULATORY_CARE_PROVIDER_SITE_OTHER): Payer: Medicaid Other

## 2014-07-03 DIAGNOSIS — E1149 Type 2 diabetes mellitus with other diabetic neurological complication: Secondary | ICD-10-CM

## 2014-07-03 DIAGNOSIS — E1165 Type 2 diabetes mellitus with hyperglycemia: Principal | ICD-10-CM

## 2014-07-03 DIAGNOSIS — IMO0002 Reserved for concepts with insufficient information to code with codable children: Secondary | ICD-10-CM

## 2014-07-03 LAB — COMPREHENSIVE METABOLIC PANEL
ALK PHOS: 50 U/L (ref 39–117)
ALT: 10 U/L (ref 0–53)
AST: 14 U/L (ref 0–37)
Albumin: 3.6 g/dL (ref 3.5–5.2)
BILIRUBIN TOTAL: 0.4 mg/dL (ref 0.2–1.2)
BUN: 37 mg/dL — AB (ref 6–23)
CO2: 27 meq/L (ref 19–32)
CREATININE: 1.48 mg/dL (ref 0.40–1.50)
Calcium: 9.6 mg/dL (ref 8.4–10.5)
Chloride: 104 mEq/L (ref 96–112)
GFR: 51.79 mL/min — AB (ref >60.00)
Glucose, Bld: 139 mg/dL — ABNORMAL HIGH (ref 70–99)
Potassium: 4.6 mEq/L (ref 3.5–5.1)
Sodium: 138 mEq/L (ref 135–145)
TOTAL PROTEIN: 6.6 g/dL (ref 6.0–8.3)

## 2014-07-03 LAB — MICROALBUMIN / CREATININE URINE RATIO
CREATININE, U: 67.9 mg/dL
MICROALB UR: 202.1 mg/dL — AB (ref 0.0–1.9)
MICROALB/CREAT RATIO: 297.6 mg/g — AB (ref 0.0–30.0)

## 2014-07-03 LAB — HEMOGLOBIN A1C: Hgb A1c MFr Bld: 6.5 % (ref 4.6–6.5)

## 2014-07-03 LAB — LIPID PANEL
CHOL/HDL RATIO: 6
Cholesterol: 190 mg/dL (ref 0–200)
HDL: 33.8 mg/dL — ABNORMAL LOW (ref >39.00)
NonHDL: 156.2
TRIGLYCERIDES: 241 mg/dL — AB (ref 0.0–149.0)
VLDL: 48.2 mg/dL — ABNORMAL HIGH (ref 0.0–40.0)

## 2014-07-03 LAB — LDL CHOLESTEROL, DIRECT: Direct LDL: 119 mg/dL

## 2014-07-06 ENCOUNTER — Telehealth: Payer: Self-pay | Admitting: Endocrinology

## 2014-07-06 ENCOUNTER — Encounter: Payer: Self-pay | Admitting: Endocrinology

## 2014-07-06 ENCOUNTER — Ambulatory Visit (INDEPENDENT_AMBULATORY_CARE_PROVIDER_SITE_OTHER): Payer: Medicaid Other | Admitting: Endocrinology

## 2014-07-06 ENCOUNTER — Other Ambulatory Visit: Payer: Self-pay | Admitting: *Deleted

## 2014-07-06 VITALS — BP 144/76 | HR 64 | Temp 98.4°F | Resp 16 | Ht 68.0 in | Wt 281.0 lb

## 2014-07-06 DIAGNOSIS — E1142 Type 2 diabetes mellitus with diabetic polyneuropathy: Secondary | ICD-10-CM

## 2014-07-06 DIAGNOSIS — G5601 Carpal tunnel syndrome, right upper limb: Secondary | ICD-10-CM

## 2014-07-06 DIAGNOSIS — G629 Polyneuropathy, unspecified: Secondary | ICD-10-CM

## 2014-07-06 DIAGNOSIS — N183 Chronic kidney disease, stage 3 unspecified: Secondary | ICD-10-CM

## 2014-07-06 DIAGNOSIS — E1121 Type 2 diabetes mellitus with diabetic nephropathy: Secondary | ICD-10-CM

## 2014-07-06 DIAGNOSIS — G5602 Carpal tunnel syndrome, left upper limb: Secondary | ICD-10-CM

## 2014-07-06 DIAGNOSIS — G5603 Carpal tunnel syndrome, bilateral upper limbs: Secondary | ICD-10-CM

## 2014-07-06 DIAGNOSIS — I10 Essential (primary) hypertension: Secondary | ICD-10-CM

## 2014-07-06 MED ORDER — LOSARTAN POTASSIUM 25 MG PO TABS: 25.0000 mg | ORAL_TABLET | Freq: Every day | ORAL | Status: DC

## 2014-07-06 NOTE — Telephone Encounter (Signed)
Message left on patients vm, rx sent 

## 2014-07-06 NOTE — Progress Notes (Signed)
Patient ID: Alexander Hubbard, male   DOB: November 08, 1955, 59 y.o.   MRN: [id number]   Reason for Appointment: Diabetes follow-up   History of Present Illness   Diagnosis: Type 2 DIABETES MELITUS, insulin requiring     PAST history: He has been on relatively large doses of insulin for a few years and was not well controlled until he started the U-500 insulin This particularly help his fasting hyperglycemia which was difficult to control. He only benefited partially from Victoza and this did not help him with weight loss significantly A1c had increased in 8/14  Insulin regimen:   RECENT history:  He has had significant improvement in his control after bariatric surgery in early April 2015 Previously was on basal bolus insulin regimen. He was told to try Januvia on his last visit in 9/15 but he claims he had some side effects and on his own went on metformin which he had from before.  He recently had difficulty with larger tablet but is able to take this now His creatinine clearance is still over 50 Even with continued weight loss he still appears to be having high normal readings and A1c is also upper normal He thinks he had only one high blood sugar 175 during an episode of acute illness Did not bring his monitor for download Is continuing  to lose a significant amount of weight and is getting small portions , usually a protein shake late in the evening  Monitors blood glucose: ?  Twice a day Glucometer: Accu-Chek        Blood Glucose readings from recall: 95-130 am/hs, lab fasting = 139  Hypoglycemia:  none            Physical activity: exercise:  At Gym uses bike 60 min plus weights Prior weight 406            Wt Readings from Last 3 Encounters:  07/06/14 281 lb (127.461 kg)  06/30/14 284 lb (128.822 kg)  05/05/14 290 lb (131.543 kg)   Diabetes labs:  Lab Results  Component Value Date   HGBA1C 6.5 07/03/2014   HGBA1C 6.4 12/26/2013   HGBA1C 6.0 10/11/2013   Lab Results   Component Value Date   MICROALBUR 202.1* 07/03/2014   LDLCALC 27 10/11/2013   CREATININE 1.48 07/03/2014         Medication List       This list is accurate as of: 07/06/14  3:01 PM.  Always use your most recent med list.               ACCU-CHEK SMARTVIEW test strip  Generic drug:  glucose blood  1 each by Other route See admin instructions. Check blood sugar 3 times daily.     ammonium lactate 12 % lotion  Commonly known as:  LAC-HYDRIN  Apply 1 application topically 2 (two) times daily as needed (legs).     aspirin EC 81 MG tablet  Take 81 mg by mouth daily.     brinzolamide 1 % ophthalmic suspension  Commonly known as:  AZOPT  Place 1 drop into both eyes 2 (two) times daily.     calcium carbonate 1250 (500 CA) MG chewable tablet  Commonly known as:  OS-CAL  Chew 1 tablet by mouth daily.     carvedilol 3.125 MG tablet  Commonly known as:  COREG  Take 3.125 mg by mouth 2 (two) times daily with a meal.     colchicine 0.6 MG tablet  TAKE 1 TABLET  BY MOUTH EVERY DAY     CRESTOR 20 MG tablet  Generic drug:  rosuvastatin  TAKE 1 TABLET (20 MG TOTAL) BY MOUTH DAILY.     diclofenac sodium 1 % Gel  Commonly known as:  VOLTAREN  Apply 2 g topically 4 (four) times daily.     docusate sodium 100 MG capsule  Commonly known as:  COLACE  Take 100 mg by mouth 2 (two) times daily.     ferrous sulfate 325 (65 FE) MG EC tablet  Take 325 mg by mouth 3 (three) times daily with meals.     gabapentin 300 MG capsule  Commonly known as:  NEURONTIN  TAKE 2 CAPSULES BY MOUTH 3 TIMES A DAY     latanoprost 0.005 % ophthalmic solution  Commonly known as:  XALATAN  Place 1 drop into the left eye at bedtime.     Linaclotide 145 MCG Caps capsule  Commonly known as:  LINZESS  Take 1 capsule (145 mcg total) by mouth daily.     metFORMIN 500 MG tablet  Commonly known as:  GLUCOPHAGE  TAKE 1 TABLET BY MOUTH 2 TIMES A DAY WITH A MEAL     multivitamin capsule  Take 1 capsule by  mouth 2 (two) times daily.     NEOMYCIN-POLYMYXIN-HYDROCORTISONE 1 % Soln otic solution  Commonly known as:  CORTISPORIN  PLACE 4 DROPS INTO BOTH EARS 4 (FOUR) TIMES DAILY.     OVER THE COUNTER MEDICATION  Take 1 each by mouth 3 (three) times daily. "Calcium Citrate Chewy Bite 500-500"     OVER THE COUNTER MEDICATION  Take 1 tablet by mouth at bedtime. "Strawberry Flavored Chewable Iron with Vitamin C 18 mg / 30 mg"     pantoprazole 40 MG tablet  Commonly known as:  PROTONIX  TAKE 1 TABLET BY MOUTH TWICE A DAY     polyethylene glycol packet  Commonly known as:  MIRALAX / GLYCOLAX  Take 17 g by mouth at bedtime.     prednisoLONE acetate 1 % ophthalmic suspension  Commonly known as:  PRED FORTE  Place 1 drop into the right eye at bedtime.     sildenafil 100 MG tablet  Commonly known as:  VIAGRA  Take 1 tablet (100 mg total) by mouth daily as needed for erectile dysfunction.     timolol 0.5 % ophthalmic solution  Commonly known as:  TIMOPTIC  Place 1 drop into both eyes 2 (two) times daily.     ULORIC 40 MG tablet  Generic drug:  febuxostat  TAKE 1 TABLET BY MOUTH EVERY DAY     UNABLE TO FIND  - Dispense one pair of Diabetic Shoes.  - ICD E11.49, G62.9, B35.1, R60.9     vitamin B-12 1000 MCG tablet  Commonly known as:  CYANOCOBALAMIN  Take 1,000 mcg by mouth. (Chewable tablet)        Allergies:  Allergies  Allergen Reactions  . Penicillins Swelling and Rash  . Amoxicillin Swelling and Rash  . Niacin Itching and Other (See Comments)    Other reaction(s): Flushing (ALLERGY/intolerance)  . Penicillin G Rash    Past Medical History  Diagnosis Date  . Diabetes mellitus   . Hypertension   . Obesity   . Sleep apnea   . Gouty arthritis     Past Surgical History  Procedure Laterality Date  . Eye surgery    . Knee surgery    . Gastric bypass      Family History  Problem Relation Age of Onset  . Hypertension Sister   . Diabetes Mellitus II Sister      Social History:  reports that he has never smoked. He has never used smokeless tobacco. He reports that he does not drink alcohol or use illicit drugs.  Review of Systems:  HYPERTENSION:  currently on Coreg low dose; blood pressure appears higher today but he thinks that it is about 120/70 at home  HYPERLIPIDEMIA: Had mostly triglyceride increase prior to bariatric surgery and weight loss. The niacin was stopped by  bariatric surgeon And also Crestor was stopped on his last visit   Lab Results  Component Value Date   CHOL 190 07/03/2014   HDL 33.80* 07/03/2014   LDLCALC 27 10/11/2013   LDLDIRECT 119.0 07/03/2014   TRIG 241.0* 07/03/2014   CHOLHDL 6 07/03/2014   He has history of neuropathy with sensory loss in his feet  He complains about his hands getting numb and has some pains in his hands also especially at night.  He was taking gabapentin but this was stopped by his nephrologist because of potential for renal dysfunction with this.  However her renal function has not deteriorated.  He thinks the gabapentin helps symptoms.  He does have history of nephropathy with  history of renal dysfunction which  has significantly improved  and creatinine is consistently near normal  Lab Results  Component Value Date   CREATININE 1.48 07/03/2014   Has had less problems with his knee joints.   Examination:   BP 144/76 mmHg  Pulse 64  Temp(Src) 98.4 F (36.9 C)  Resp 16  Ht 5\' 8"  (1.727 m)  Wt 281 lb (127.461 kg)  BMI 42.74 kg/m2  SpO2 96%  Body mass index is 42.74 kg/(m^2).   Repeat blood pressure 144/76  No atrophy of fingers in the hand. Tinel's sign is positive on the left No pedal edema  ASSESSMENT/ PLAN:   Diabetes type 2   His blood sugars appear to be fairly good now and overall he is using only small doses of 500 mg twice a day of metformin His A1c is upper normal and most likely will need to continue some pharmacological treatment for his diabetes He is  fairly compliant with his diet and exercise for now  HYPERTENSION: His blood pressure is high normal today but he thinks it is better at home. Will defer management to nephrologist for PCP for now Needs periodic follow-up of urine microalbumin  Probable carpal tunnel syndrome and neuropathy in his hands. He can try gabapentin as needed and mostly to take at bedtime Also given him prescription for a wrist brace to use on his hands overnight   Patient Instructions  May take gabapentin at bedtime  Please check blood sugars at least half the time about 2 hours after any meal and 3 times per week on waking up. Please bring blood sugar monitor to each visit. Recommended blood sugar levels about 2 hours after meal is 140-180 and on waking up 90-130      Alexander Hubbard 07/06/2014, 3:01 PM   Addendum: Urine microalbumin is high, start losartan 25 mg daily

## 2014-07-06 NOTE — Telephone Encounter (Signed)
Forgot to tell the patient that he has now protein increase in the urine and needs to start losartan 25 mg daily to protect the kidneys, this will help blood pressures also

## 2014-07-06 NOTE — Patient Instructions (Addendum)
May take gabapentin at bedtime  Please check blood sugars at least half the time about 2 hours after any meal and 3 times per week on waking up. Please bring blood sugar monitor to each visit. Recommended blood sugar levels about 2 hours after meal is 140-180 and on waking up 90-130

## 2014-07-13 ENCOUNTER — Ambulatory Visit: Payer: Medicaid Other | Admitting: Family Medicine

## 2014-07-13 ENCOUNTER — Ambulatory Visit: Payer: Medicaid Other | Admitting: Podiatrist

## 2014-07-18 ENCOUNTER — Encounter: Payer: Self-pay | Admitting: Family Medicine

## 2014-07-18 ENCOUNTER — Ambulatory Visit (INDEPENDENT_AMBULATORY_CARE_PROVIDER_SITE_OTHER): Payer: Medicaid Other | Admitting: Family Medicine

## 2014-07-18 VITALS — BP 130/68 | HR 66 | Temp 98.0°F | Resp 18 | Ht 68.0 in | Wt 283.0 lb

## 2014-07-18 DIAGNOSIS — E1140 Type 2 diabetes mellitus with diabetic neuropathy, unspecified: Secondary | ICD-10-CM

## 2014-07-18 NOTE — Progress Notes (Signed)
Subjective:    Patient ID: Alexander BouquetRiyad Lamorte, male    DOB: 06/11/1955, 59 y.o.   MRN: [id number]  HPI Patient presents with one-week of pain in both legs. The pain originates below his knee and radiates down both legs into his feet. The pain is located over his anterior tibia. It is burning in nature. It is deep to the skin. It is intense and burning. It typically occurs late in the afternoon. Patient takes Neurontin 300 mg by mouth daily at bedtime. He is pain-free from late in the evening around midnight until the following morning and then the pain returns. His gabapentin was previously decreased due to his kidney disease. His most recent creatinine was 1.48 when checked in February 1. He also complains of itching without a rash on his lower back and his posterior legs. There is no visible rash today Past Medical History  Diagnosis Date  . Diabetes mellitus   . Hypertension   . Obesity   . Sleep apnea   . Gouty arthritis    Past Surgical History  Procedure Laterality Date  . Eye surgery    . Knee surgery    . Gastric bypass     Current Outpatient Prescriptions on File Prior to Visit  Medication Sig Dispense Refill  . ammonium lactate (LAC-HYDRIN) 12 % lotion Apply 1 application topically 2 (two) times daily as needed (legs). 400 g 2  . aspirin EC 81 MG tablet Take 81 mg by mouth daily.    . brinzolamide (AZOPT) 1 % ophthalmic suspension Place 1 drop into both eyes 2 (two) times daily.      . calcium carbonate (OS-CAL) 1250 (500 CA) MG chewable tablet Chew 1 tablet by mouth daily.    . carvedilol (COREG) 3.125 MG tablet Take 3.125 mg by mouth 2 (two) times daily with a meal.    . colchicine 0.6 MG tablet TAKE 1 TABLET BY MOUTH EVERY DAY 30 tablet 5  . CRESTOR 20 MG tablet TAKE 1 TABLET (20 MG TOTAL) BY MOUTH DAILY. 30 tablet 3  . diclofenac sodium (VOLTAREN) 1 % GEL Apply 2 g topically 4 (four) times daily. 100 g 1  . docusate sodium (COLACE) 100 MG capsule Take 100 mg by mouth 2 (two)  times daily.    . ferrous sulfate 325 (65 FE) MG EC tablet Take 325 mg by mouth 3 (three) times daily with meals.    . gabapentin (NEURONTIN) 300 MG capsule TAKE 2 CAPSULES BY MOUTH 3 TIMES A DAY (Patient taking differently: TAKE 1 capsule daily) 180 capsule 3  . glucose blood (ACCU-CHEK SMARTVIEW) test strip 1 each by Other route See admin instructions. Check blood sugar 3 times daily.    Marland Kitchen. latanoprost (XALATAN) 0.005 % ophthalmic solution Place 1 drop into the left eye at bedtime.     . Linaclotide (LINZESS) 145 MCG CAPS capsule Take 1 capsule (145 mcg total) by mouth daily. 30 capsule 11  . losartan (COZAAR) 25 MG tablet Take 1 tablet (25 mg total) by mouth daily. 30 tablet 3  . metFORMIN (GLUCOPHAGE) 500 MG tablet TAKE 1 TABLET BY MOUTH 2 TIMES A DAY WITH A MEAL 60 tablet 0  . Multiple Vitamin (MULTIVITAMIN) capsule Take 1 capsule by mouth 2 (two) times daily.    . NEOMYCIN-POLYMYXIN-HYDROCORTISONE (CORTISPORIN) 1 % SOLN otic solution PLACE 4 DROPS INTO BOTH EARS 4 (FOUR) TIMES DAILY. 10 mL 0  . OVER THE COUNTER MEDICATION Take 1 each by mouth 3 (three) times daily. "  Calcium Citrate Chewy Bite 500-500"    . OVER THE COUNTER MEDICATION Take 1 tablet by mouth at bedtime. "Strawberry Flavored Chewable Iron with Vitamin C 18 mg / 30 mg"    . pantoprazole (PROTONIX) 40 MG tablet TAKE 1 TABLET BY MOUTH TWICE A DAY 60 tablet 11  . polyethylene glycol (MIRALAX / GLYCOLAX) packet Take 17 g by mouth at bedtime.    . prednisoLONE acetate (PRED FORTE) 1 % ophthalmic suspension Place 1 drop into the right eye at bedtime.     . sildenafil (VIAGRA) 100 MG tablet Take 1 tablet (100 mg total) by mouth daily as needed for erectile dysfunction. 6 tablet 12  . timolol (TIMOPTIC) 0.5 % ophthalmic solution Place 1 drop into both eyes 2 (two) times daily.    Marland Kitchen ULORIC 40 MG tablet TAKE 1 TABLET BY MOUTH EVERY DAY 30 tablet 5  . UNABLE TO FIND Dispense one pair of Diabetic Shoes. ICD E11.49, G62.9, B35.1, R60.9 1 each  0  . vitamin B-12 (CYANOCOBALAMIN) 1000 MCG tablet Take 1,000 mcg by mouth. (Chewable tablet)     No current facility-administered medications on file prior to visit.   Allergies  Allergen Reactions  . Penicillins Swelling and Rash  . Amoxicillin Swelling and Rash  . Niacin Itching and Other (See Comments)    Other reaction(s): Flushing (ALLERGY/intolerance)  . Penicillin G Rash   History   Social History  . Marital Status: Married    Spouse Name: N/A  . Number of Children: N/A  . Years of Education: N/A   Occupational History  . Not on file.   Social History Main Topics  . Smoking status: Never Smoker   . Smokeless tobacco: Never Used  . Alcohol Use: No  . Drug Use: No  . Sexual Activity: Not on file   Other Topics Concern  . Not on file   Social History Narrative      Review of Systems  All other systems reviewed and are negative.      Objective:   Physical Exam  Constitutional: He appears well-developed and well-nourished.  Cardiovascular: Normal rate, regular rhythm and normal heart sounds.   Pulmonary/Chest: Effort normal and breath sounds normal. No respiratory distress. He has no wheezes. He has no rales.  Abdominal: Soft. Bowel sounds are normal.  Neurological: He has normal reflexes. He displays normal reflexes. No cranial nerve deficit. He exhibits normal muscle tone. Coordination normal.  Skin: Rash noted.  Vitals reviewed.         Assessment & Plan:  Neuropathy due to type 2 diabetes mellitus  I believe this is  Neuropathic pain due to his diabetes. I recommended he increase his gabapentin to 300 mg by mouth 3 times a day. Recheck in one week. If no better proceed with x-rays of the tibia and fibula bilaterally. Symptoms do not sound like claudication. There is no visible rash. There is no evidence of bony injury. Patient denies any injuries or falls. There is no evidence of cellulitis.

## 2014-07-19 ENCOUNTER — Telehealth: Payer: Self-pay | Admitting: *Deleted

## 2014-07-19 NOTE — Telephone Encounter (Signed)
Submitted referral thru [employer] for referral authorization to Dr. Donato SchultzMark Skains, MD Cardiology with referral number [id number] APPROVED  Referral type: Evaluation and treatment  Number of visits:6  Effective Begin Date 07/21/2014 effec  Effective End Date 12/19/2014  Referred to provider: [phone]; Donato SchultzMark Skains, MD  Address: [address], [place], [place]

## 2014-07-20 ENCOUNTER — Telehealth: Payer: Self-pay | Admitting: Family Medicine

## 2014-07-20 NOTE — Telephone Encounter (Signed)
[facility] been place [facility]

## 2014-07-20 NOTE — Telephone Encounter (Signed)
Appointment is in epic, and patient is calling to say that he needs referral for this, his appointment is tomorrow  If any questions call him at [phone]  It is for his heart doctor

## 2014-07-21 ENCOUNTER — Ambulatory Visit (INDEPENDENT_AMBULATORY_CARE_PROVIDER_SITE_OTHER): Payer: Medicaid Other | Admitting: Cardiology

## 2014-07-21 ENCOUNTER — Encounter: Payer: Self-pay | Admitting: Cardiology

## 2014-07-21 VITALS — BP 126/72 | HR 65 | Ht 68.0 in | Wt 280.0 lb

## 2014-07-21 DIAGNOSIS — G4733 Obstructive sleep apnea (adult) (pediatric): Secondary | ICD-10-CM

## 2014-07-21 DIAGNOSIS — E119 Type 2 diabetes mellitus without complications: Secondary | ICD-10-CM

## 2014-07-21 DIAGNOSIS — I10 Essential (primary) hypertension: Secondary | ICD-10-CM

## 2014-07-21 DIAGNOSIS — R634 Abnormal weight loss: Secondary | ICD-10-CM

## 2014-07-21 NOTE — Patient Instructions (Signed)
The current medical regimen is effective;  continue present plan and medications.  Follow up in 6 months with Dr. Skains.  You will receive a letter in the mail 2 months before you are due.  Please call us when you receive this letter to schedule your follow up appointment.  Thank you for choosing [facility]!!     

## 2014-07-21 NOTE — Progress Notes (Signed)
[address] [place], [place]  [address] Phone: [phone] Fax:  [phone]  Date:  07/21/2014   ID:  Alexander Hubbard, DOB 15-Jun-1955, MRN [id number]  PCP:  Leo GrosserPICKARD,WARREN TOM, MD   History of Present Illness: Alexander Hubbard is a 59 y.o. male here for follow up post weight reduction surgery. Has lost significant amount of weight.  Had prior issues with emesis. Endoscopy performed.  Ulcer has healed.  He has a history of morbid obesity, hypertension, diabetes, chronic kidney disease, chronic diastolic heart failure, noncompliant obstructive sleep apnea CPAP.  EF probably 45%. NUC stress 4/13 low risk (reduced sensitivity due to attenuation artifact).  Currently he feels at baseline. No significant chest pain. No significant shortness of breath, in fact he feels better from a breathing perspective as he has in a long time.    Wt Readings from Last 3 Encounters:  07/21/14 280 lb (127.007 kg)  07/18/14 283 lb (128.368 kg)  07/06/14 281 lb (127.461 kg)     Past Medical History  Diagnosis Date  . Diabetes mellitus   . Hypertension   . Obesity   . Sleep apnea   . Gouty arthritis     Past Surgical History  Procedure Laterality Date  . Eye surgery    . Knee surgery    . Gastric bypass      Current Outpatient Prescriptions  Medication Sig Dispense Refill  . ammonium lactate (LAC-HYDRIN) 12 % lotion Apply 1 application topically 2 (two) times daily as needed (legs). 400 g 2  . aspirin EC 81 MG tablet Take 81 mg by mouth daily.    . brinzolamide (AZOPT) 1 % ophthalmic suspension Place 1 drop into both eyes 2 (two) times daily.      . calcium carbonate (OS-CAL) 1250 (500 CA) MG chewable tablet Chew 1 tablet by mouth daily.    . carvedilol (COREG) 3.125 MG tablet Take 3.125 mg by mouth 2 (two) times daily with a meal.    . colchicine 0.6 MG tablet TAKE 1 TABLET BY MOUTH EVERY DAY 30 tablet 5  . CRESTOR 20 MG tablet TAKE 1 TABLET (20 MG TOTAL) BY MOUTH DAILY. 30  tablet 3  . diclofenac sodium (VOLTAREN) 1 % GEL Apply 2 g topically 4 (four) times daily. 100 g 1  . docusate sodium (COLACE) 100 MG capsule Take 100 mg by mouth 2 (two) times daily.    . ferrous sulfate 325 (65 FE) MG EC tablet Take 325 mg by mouth 3 (three) times daily with meals.    . gabapentin (NEURONTIN) 300 MG capsule TAKE 2 CAPSULES BY MOUTH 3 TIMES A DAY (Patient taking differently: TAKE 1 capsule daily) 180 capsule 3  . glucose blood (ACCU-CHEK SMARTVIEW) test strip 1 each by Other route See admin instructions. Check blood sugar 3 times daily.    Marland Kitchen. latanoprost (XALATAN) 0.005 % ophthalmic solution Place 1 drop into the left eye at bedtime.     . Linaclotide (LINZESS) 145 MCG CAPS capsule Take 1 capsule (145 mcg total) by mouth daily. 30 capsule 11  . losartan (COZAAR) 25 MG tablet Take 1 tablet (25 mg total) by mouth daily. 30 tablet 3  . metFORMIN (GLUCOPHAGE) 500 MG tablet TAKE 1 TABLET BY MOUTH 2 TIMES A DAY WITH A MEAL 60 tablet 0  . Multiple Vitamin (MULTIVITAMIN) capsule Take 1 capsule by mouth 2 (two) times daily.    . NEOMYCIN-POLYMYXIN-HYDROCORTISONE (CORTISPORIN) 1 % SOLN otic solution PLACE  4 DROPS INTO BOTH EARS 4 (FOUR) TIMES DAILY. 10 mL 0  . OVER THE COUNTER MEDICATION Take 1 each by mouth 3 (three) times daily. "Calcium Citrate Chewy Bite 500-500"    . OVER THE COUNTER MEDICATION Take 1 tablet by mouth at bedtime. "Strawberry Flavored Chewable Iron with Vitamin C 18 mg / 30 mg"    . pantoprazole (PROTONIX) 40 MG tablet TAKE 1 TABLET BY MOUTH TWICE A DAY 60 tablet 11  . polyethylene glycol (MIRALAX / GLYCOLAX) packet Take 17 g by mouth at bedtime.    . prednisoLONE acetate (PRED FORTE) 1 % ophthalmic suspension Place 1 drop into the right eye at bedtime.     . sildenafil (VIAGRA) 100 MG tablet Take 1 tablet (100 mg total) by mouth daily as needed for erectile dysfunction. 6 tablet 12  . timolol (TIMOPTIC) 0.5 % ophthalmic solution Place 1 drop into both eyes 2 (two) times  daily.    Marland Kitchen ULORIC 40 MG tablet TAKE 1 TABLET BY MOUTH EVERY DAY 30 tablet 5  . UNABLE TO FIND Dispense one pair of Diabetic Shoes. ICD E11.49, G62.9, B35.1, R60.9 1 each 0  . vitamin B-12 (CYANOCOBALAMIN) 1000 MCG tablet Take 1,000 mcg by mouth. (Chewable tablet)     No current facility-administered medications for this visit.    Allergies:    Allergies  Allergen Reactions  . Penicillins Swelling and Rash  . Amoxicillin Swelling and Rash  . Niacin Itching and Other (See Comments)    Other reaction(s): Flushing (ALLERGY/intolerance)  . Penicillin G Rash    Social History:  The patient  reports that he has never smoked. He has never used smokeless tobacco. He reports that he does not drink alcohol or use illicit drugs.   ROS:  Please see the history of present illness.    Denies any chest discomfort, no significant increase in shortness of breath, positive for isolated syncopal episode as described above, no rashes, no fevers, no cough.    PHYSICAL EXAM: VS:  BP 126/72 mmHg  Pulse 65  Ht  (1.727 m)  Wt 280 lb (127.007 kg)  BMI 42.58 kg/m2 Well nourished, well developed, in no acute distress HEENT: normal Neck: no JVD, very thick Cardiac:  normal S1, S2; RRR; soft systolic murmurRight upper sternal border, distant heart sounds due to to body habitus Lungs:  clear to auscultation bilaterally, no wheezing, rhonchi or rales Abd: soft, nontender, no hepatomegalyMorbid obesity Ext: no edema Skin: warm and dry Neuro: no focal abnormalities noted  EKG:    04/14/14-normal sinus rhythm, 66, right bundle branch block.  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 Weight loss-significant weight loss post gastric surgery. RBBB no change. 2. Morbid obesity- 280 from 408. Wonderful.  No symptoms. No anginal symptoms. Doing well. Continue to encourage exercise. 3. Hypertension-currently controlled. 4. Diabetes-per primary  team. No longer on insulin 5. Obstructive sleep apnea-encourage use of CPAP. 6. Chronic kidney disease stage III-he is seeing nephrology. 7. Erectile dysfunction-I'm fine with him utilizing Viagra 100 mg. Avoid nitrates. 8. We will see back in 6 months.  Signed, Donato Schultz, MD [facility]  07/21/2014 2:13 PM

## 2014-07-24 ENCOUNTER — Other Ambulatory Visit: Payer: Self-pay | Admitting: Endocrinology

## 2014-08-02 ENCOUNTER — Encounter (HOSPITAL_COMMUNITY): Payer: Self-pay | Admitting: *Deleted

## 2014-08-02 ENCOUNTER — Observation Stay (HOSPITAL_COMMUNITY)
Admission: EM | Admit: 2014-08-02 | Discharge: 2014-08-04 | Disposition: A | Discharge status: Home / Self Care | Payer: Medicaid Other | Attending: Emergency Medicine | Admitting: Emergency Medicine

## 2014-08-02 ENCOUNTER — Emergency Department (HOSPITAL_COMMUNITY)
Admission: EM | Admit: 2014-08-02 | Discharge: 2014-08-02 | Disposition: A | Discharge status: Home / Self Care | Payer: Medicaid Other | Source: Home / Self Care | Attending: Emergency Medicine | Admitting: Emergency Medicine

## 2014-08-02 ENCOUNTER — Encounter (HOSPITAL_COMMUNITY): Payer: Self-pay | Admitting: Emergency Medicine

## 2014-08-02 ENCOUNTER — Emergency Department (HOSPITAL_COMMUNITY): Payer: Medicaid Other

## 2014-08-02 DIAGNOSIS — E119 Type 2 diabetes mellitus without complications: Secondary | ICD-10-CM | POA: Diagnosis not present

## 2014-08-02 DIAGNOSIS — G629 Polyneuropathy, unspecified: Secondary | ICD-10-CM

## 2014-08-02 DIAGNOSIS — M542 Cervicalgia: Secondary | ICD-10-CM | POA: Diagnosis not present

## 2014-08-02 DIAGNOSIS — R51 Headache: Secondary | ICD-10-CM

## 2014-08-02 DIAGNOSIS — Z79899 Other long term (current) drug therapy: Secondary | ICD-10-CM | POA: Insufficient documentation

## 2014-08-02 DIAGNOSIS — E669 Obesity, unspecified: Secondary | ICD-10-CM | POA: Insufficient documentation

## 2014-08-02 DIAGNOSIS — R600 Localized edema: Secondary | ICD-10-CM

## 2014-08-02 DIAGNOSIS — IMO0001 Reserved for inherently not codable concepts without codable children: Secondary | ICD-10-CM

## 2014-08-02 DIAGNOSIS — G4730 Sleep apnea, unspecified: Secondary | ICD-10-CM | POA: Diagnosis not present

## 2014-08-02 DIAGNOSIS — R609 Edema, unspecified: Secondary | ICD-10-CM

## 2014-08-02 DIAGNOSIS — R0902 Hypoxemia: Secondary | ICD-10-CM

## 2014-08-02 DIAGNOSIS — Z880 Allergy status to penicillin: Secondary | ICD-10-CM | POA: Insufficient documentation

## 2014-08-02 DIAGNOSIS — N183 Chronic kidney disease, stage 3 unspecified: Secondary | ICD-10-CM

## 2014-08-02 DIAGNOSIS — Z791 Long term (current) use of non-steroidal anti-inflammatories (NSAID): Secondary | ICD-10-CM | POA: Diagnosis not present

## 2014-08-02 DIAGNOSIS — R6510 Systemic inflammatory response syndrome (SIRS) of non-infectious origin without acute organ dysfunction: Secondary | ICD-10-CM

## 2014-08-02 DIAGNOSIS — R519 Headache, unspecified: Secondary | ICD-10-CM | POA: Diagnosis present

## 2014-08-02 DIAGNOSIS — I10 Essential (primary) hypertension: Secondary | ICD-10-CM | POA: Diagnosis not present

## 2014-08-02 DIAGNOSIS — Z7982 Long term (current) use of aspirin: Secondary | ICD-10-CM | POA: Insufficient documentation

## 2014-08-02 DIAGNOSIS — I5032 Chronic diastolic (congestive) heart failure: Secondary | ICD-10-CM

## 2014-08-02 DIAGNOSIS — M7989 Other specified soft tissue disorders: Secondary | ICD-10-CM

## 2014-08-02 DIAGNOSIS — M1009 Idiopathic gout, multiple sites: Secondary | ICD-10-CM | POA: Insufficient documentation

## 2014-08-02 DIAGNOSIS — N289 Disorder of kidney and ureter, unspecified: Secondary | ICD-10-CM | POA: Diagnosis not present

## 2014-08-02 DIAGNOSIS — Z794 Long term (current) use of insulin: Secondary | ICD-10-CM

## 2014-08-02 DIAGNOSIS — G4733 Obstructive sleep apnea (adult) (pediatric): Secondary | ICD-10-CM

## 2014-08-02 DIAGNOSIS — E785 Hyperlipidemia, unspecified: Secondary | ICD-10-CM

## 2014-08-02 DIAGNOSIS — G4489 Other headache syndrome: Secondary | ICD-10-CM | POA: Diagnosis not present

## 2014-08-02 DIAGNOSIS — N184 Chronic kidney disease, stage 4 (severe): Secondary | ICD-10-CM

## 2014-08-02 DIAGNOSIS — R197 Diarrhea, unspecified: Secondary | ICD-10-CM

## 2014-08-02 DIAGNOSIS — G44209 Tension-type headache, unspecified, not intractable: Secondary | ICD-10-CM | POA: Diagnosis not present

## 2014-08-02 DIAGNOSIS — I872 Venous insufficiency (chronic) (peripheral): Secondary | ICD-10-CM

## 2014-08-02 DIAGNOSIS — M62838 Other muscle spasm: Secondary | ICD-10-CM

## 2014-08-02 LAB — I-STAT CHEM 8, ED
BUN: 46 mg/dL — AB (ref 6–23)
CALCIUM ION: 1.3 mmol/L — AB (ref 1.12–1.23)
CREATININE: 1.6 mg/dL — AB (ref 0.50–1.35)
Chloride: 102 mmol/L (ref 96–112)
GLUCOSE: 153 mg/dL — AB (ref 70–99)
HCT: 42 % (ref 39.0–52.0)
Hemoglobin: 14.3 g/dL (ref 13.0–17.0)
Potassium: 4.4 mmol/L (ref 3.5–5.1)
Sodium: 140 mmol/L (ref 135–145)
TCO2: 22 mmol/L (ref 0–100)

## 2014-08-02 LAB — BASIC METABOLIC PANEL
ANION GAP: 6 (ref 5–15)
BUN: 46 mg/dL — ABNORMAL HIGH (ref 6–23)
CO2: 28 mmol/L (ref 19–32)
Calcium: 9.9 mg/dL (ref 8.4–10.5)
Chloride: 103 mmol/L (ref 96–112)
Creatinine, Ser: 1.57 mg/dL — ABNORMAL HIGH (ref 0.50–1.35)
GFR, EST AFRICAN AMERICAN: 54 mL/min — AB (ref >90)
GFR, EST NON AFRICAN AMERICAN: 47 mL/min — AB (ref >90)
Glucose, Bld: 150 mg/dL — ABNORMAL HIGH (ref 70–99)
Potassium: 4.2 mmol/L (ref 3.5–5.1)
SODIUM: 137 mmol/L (ref 135–145)

## 2014-08-02 MED ORDER — IOHEXOL 350 MG/ML SOLN
100.0000 mL | Freq: Once | INTRAVENOUS | Status: AC | PRN
  Administered 2014-08-02: 100 mL via INTRAVENOUS

## 2014-08-02 MED ORDER — SODIUM CHLORIDE 0.9 % IV BOLUS (SEPSIS)
500.0000 mL | Freq: Once | INTRAVENOUS | Status: AC
  Administered 2014-08-02: 500 mL via INTRAVENOUS

## 2014-08-02 MED ORDER — DIAZEPAM 5 MG PO TABS
5.0000 mg | ORAL_TABLET | Freq: Once | ORAL | Status: AC
  Administered 2014-08-02: 5 mg via ORAL
  Filled 2014-08-02: qty 1

## 2014-08-02 MED ORDER — SODIUM CHLORIDE 0.9 % IV BOLUS (SEPSIS)
1000.0000 mL | Freq: Once | INTRAVENOUS | Status: AC
  Administered 2014-08-02: 1000 mL via INTRAVENOUS

## 2014-08-02 MED ORDER — OXYCODONE-ACETAMINOPHEN 5-325 MG PO TABS
2.0000 | ORAL_TABLET | Freq: Once | ORAL | Status: AC
  Administered 2014-08-02: 2 via ORAL
  Filled 2014-08-02: qty 2

## 2014-08-02 MED ORDER — BUPIVACAINE HCL 0.5 % IJ SOLN
50.0000 mL | Freq: Once | INTRAMUSCULAR | Status: AC
  Administered 2014-08-02: 50 mL
  Filled 2014-08-02: qty 50

## 2014-08-02 MED ORDER — OXYCODONE-ACETAMINOPHEN 5-325 MG PO TABS: 1.0000 | ORAL_TABLET | Freq: Four times a day (QID) | ORAL | Status: DC | PRN

## 2014-08-02 MED ORDER — METHOCARBAMOL 500 MG PO TABS
1000.0000 mg | ORAL_TABLET | Freq: Once | ORAL | Status: AC
  Administered 2014-08-02: 1000 mg via ORAL
  Filled 2014-08-02: qty 2

## 2014-08-02 MED ORDER — METHOCARBAMOL 750 MG PO TABS: 750.0000 mg | ORAL_TABLET | Freq: Four times a day (QID) | ORAL | Status: DC | PRN

## 2014-08-02 NOTE — ED Provider Notes (Signed)
CSN: [id number]     Arrival date & time 08/02/14  4098 History   First MD Initiated Contact with Patient 08/02/14 [phone]     Chief Complaint  Patient presents with  . Neck Pain  . Leg Pain     (Consider location/radiation/quality/duration/timing/severity/associated sxs/prior Treatment) HPI 59 year old male presents emergency department with complaint of neck pain.  Symptoms started 2-3 days ago and have been worsening.  It started on his left neck, and is radiated around to the other side.  He reports he is unable to get comfortable when he is trying to lay down on a pillow.  He has pain with palpation of the area, he has pain with movement of his head in a rotational manner.  He denies any fever chills.  No trauma to the area.  Patient also complains of bilateral lower leg pain which is on been ongoing for some time.  He reports that he has follow-up with his primary care doctor later this week for recheck of his leg pain. Past Medical History  Diagnosis Date  . Diabetes mellitus   . Hypertension   . Obesity   . Sleep apnea   . Gouty arthritis    Past Surgical History  Procedure Laterality Date  . Eye surgery    . Knee surgery    . Gastric bypass     Family History  Problem Relation Age of Onset  . Hypertension Sister   . Diabetes Mellitus II Sister    History  Substance Use Topics  . Smoking status: Never Smoker   . Smokeless tobacco: Never Used  . Alcohol Use: No    Review of Systems  See History of Present Illness; otherwise all other systems are reviewed and negative   Allergies  Penicillins; Amoxicillin; Niacin; and Penicillin g  Home Medications   Prior to Admission medications   Medication Sig Start Date End Date Taking? Authorizing Provider  aspirin EC 81 MG tablet Take 81 mg by mouth daily.   Yes Historical Provider, MD  brinzolamide (AZOPT) 1 % ophthalmic suspension Place 1 drop into both eyes 2 (two) times daily.     Yes Historical Provider, MD  calcium  carbonate (OS-CAL) 1250 (500 CA) MG chewable tablet Chew 1 tablet by mouth daily.   Yes Historical Provider, MD  carvedilol (COREG) 3.125 MG tablet Take 3.125 mg by mouth 2 (two) times daily with a meal.   Yes Historical Provider, MD  colchicine 0.6 MG tablet TAKE 1 TABLET BY MOUTH EVERY DAY 04/05/14  Yes Donita Brooks, MD  CRESTOR 20 MG tablet TAKE 1 TABLET (20 MG TOTAL) BY MOUTH DAILY. 06/05/14  Yes Donita Brooks, MD  diclofenac sodium (VOLTAREN) 1 % GEL Apply 2 g topically 4 (four) times daily. 06/30/14  Yes Donita Brooks, MD  docusate sodium (COLACE) 100 MG capsule Take 100 mg by mouth 2 (two) times daily.   Yes Historical Provider, MD  ferrous sulfate 325 (65 FE) MG EC tablet Take 325 mg by mouth 3 (three) times daily with meals.   Yes Historical Provider, MD  gabapentin (NEURONTIN) 300 MG capsule Take 300 mg by mouth 2 (two) times daily.   Yes Historical Provider, MD  latanoprost (XALATAN) 0.005 % ophthalmic solution Place 1 drop into the left eye at bedtime.    Yes Historical Provider, MD  Linaclotide Karlene Einstein) 145 MCG CAPS capsule Take 1 capsule (145 mcg total) by mouth daily. 04/07/14  Yes Donita Brooks, MD  losartan (COZAAR) 25  MG tablet Take 1 tablet (25 mg total) by mouth daily. 07/06/14  Yes Reather Littler, MD  metFORMIN (GLUCOPHAGE) 500 MG tablet TAKE 1 TABLET BY MOUTH 2 TIMES A DAY WITH A MEAL 07/24/14  Yes Reather Littler, MD  Multiple Vitamin (MULTIVITAMIN) capsule Take 1 capsule by mouth 2 (two) times daily.   Yes Historical Provider, MD  OVER THE COUNTER MEDICATION Take 1 each by mouth 3 (three) times daily. "Calcium Citrate Chewy Bite 500-500"   Yes Historical Provider, MD  OVER THE COUNTER MEDICATION Take 1 tablet by mouth at bedtime. "Strawberry Flavored Chewable Iron with Vitamin C 18 mg / 30 mg"   Yes Historical Provider, MD  pantoprazole (PROTONIX) 40 MG tablet TAKE 1 TABLET BY MOUTH TWICE A DAY 03/06/14  Yes Donita Brooks, MD  polyethylene glycol (MIRALAX / GLYCOLAX) packet  Take 17 g by mouth at bedtime.   Yes Historical Provider, MD  prednisoLONE acetate (PRED FORTE) 1 % ophthalmic suspension Place 1 drop into the right eye at bedtime.    Yes Historical Provider, MD  sildenafil (VIAGRA) 100 MG tablet Take 1 tablet (100 mg total) by mouth daily as needed for erectile dysfunction. 04/14/14  Yes Donato Schultz, MD  timolol (TIMOPTIC) 0.5 % ophthalmic solution Place 1 drop into both eyes 2 (two) times daily.   Yes Historical Provider, MD  ULORIC 40 MG tablet TAKE 1 TABLET BY MOUTH EVERY DAY 04/03/14  Yes Donita Brooks, MD  vitamin B-12 (CYANOCOBALAMIN) 1000 MCG tablet Take 1,000 mcg by mouth. (Chewable tablet)   Yes Historical Provider, MD  ammonium lactate (LAC-HYDRIN) 12 % lotion Apply 1 application topically 2 (two) times daily as needed (legs). Patient not taking: Reported on 08/02/2014 08/30/13   Delories Heinz, DPM  gabapentin (NEURONTIN) 300 MG capsule TAKE 2 CAPSULES BY MOUTH 3 TIMES A DAY Patient not taking: Reported on 08/02/2014 03/20/14   Donita Brooks, MD  glucose blood (ACCU-CHEK SMARTVIEW) test strip 1 each by Other route See admin instructions. Check blood sugar 3 times daily.    Historical Provider, MD  NEOMYCIN-POLYMYXIN-HYDROCORTISONE (CORTISPORIN) 1 % SOLN otic solution PLACE 4 DROPS INTO BOTH EARS 4 (FOUR) TIMES DAILY. Patient not taking: Reported on 08/02/2014 06/05/14   Donita Brooks, MD  UNABLE TO FIND Dispense one pair of Diabetic Shoes. ICD E11.49, G62.9, B35.1, R60.9 06/12/14   Donita Brooks, MD   BP 157/80 mmHg  Pulse 59  Temp(Src) 98.8 F (37.1 C) (Oral)  Resp 20  Ht  (1.778 m)  Wt 274 lb (124.286 kg)  BMI 39.32 kg/m2  SpO2 97% Physical Exam  Constitutional: He is oriented to person, place, and time. He appears well-developed and well-nourished. He appears distressed (overweight male, uncomfortable appearing).  HENT:  Head: Normocephalic and atraumatic.  Nose: Nose normal.  Mouth/Throat: Oropharynx is clear and moist.   Eyes: Conjunctivae and EOM are normal. Pupils are equal, round, and reactive to light.  Neck: Normal range of motion. Neck supple. No JVD present. No tracheal deviation present. No thyromegaly present.  Patient has limited range of motion of the neck secondary to pain.  He is able to flex and extend but has pain with rotation.  He is tender over the sternocleidomastoid and paraspinal muscles of the neck on both sides.  He has no midline tenderness.  There is no step-off no crepitus.  There is no overlying skin changes.  Cardiovascular: Normal rate, regular rhythm, normal heart sounds and intact distal pulses.  Exam  reveals no gallop and no friction rub.   No murmur heard. Pulmonary/Chest: Effort normal and breath sounds normal. No stridor. No respiratory distress. He has no wheezes. He has no rales. He exhibits no tenderness.  Abdominal: Soft. Bowel sounds are normal. He exhibits no distension and no mass. There is no tenderness. There is no rebound and no guarding.  Musculoskeletal: Normal range of motion. He exhibits no edema or tenderness.  Lymphadenopathy:    He has no cervical adenopathy.  Neurological: He is alert and oriented to person, place, and time. He displays normal reflexes. He exhibits normal muscle tone. Coordination normal.  Skin: Skin is warm and dry. No rash noted. No erythema. No pallor.  Psychiatric: He has a normal mood and affect. His behavior is normal. Judgment and thought content normal.  Nursing note and vitals reviewed.   ED Course  Procedures (including critical care time) Labs Review Labs Reviewed - No data to display  Imaging Review No results found.   EKG Interpretation None      After obtaining verbal consent, patient was prepped for cervical paraspinal injection.  Area was cleaned with alcohol swab.  1.5 cc of 5% bupivacaine were drawn up in two syringes.  This was injected, 1 inch off the C7 vertebrae bilaterally with a 25-gauge 1-1/2 needle.  Patient  tolerated procedure well.  MDM   Final diagnoses:  Neck pain  Muscle spasms of neck    59 year old male with several days of neck pain, chronic ongoing lower extremities pain.  Exam consistent with a muscle spasm.  Patient agrees to cervical paraspinal injection.  Patient also given Robaxin and Percocet.  He has follow-up scheduled with his primary care doctor on Friday already.  5:42 AM After injection, patient is pain-free, ready for discharge home.    Olivia Mackielga M Henna Derderian, MD 08/02/14 [phone]

## 2014-08-02 NOTE — Discharge Instructions (Signed)
Take medications as needed for pain.  Warm moist heat to the area will help.  Follow-up with your doctor as arranged.  Return to the emergency department for worsening condition or new concerning symptoms.    Heat Therapy Heat therapy can help ease sore, stiff, injured, and tight muscles and joints. Heat relaxes your muscles, which may help ease your pain.  RISKS AND COMPLICATIONS If you have any of the following conditions, do not use heat therapy unless your health care provider has approved:  Poor circulation.  Healing wounds or scarred skin in the area being treated.  Diabetes, heart disease, or high blood pressure.  Not being able to feel (numbness) the area being treated.  Unusual swelling of the area being treated.  Active infections.  Blood clots.  Cancer.  Inability to communicate pain. This may include young children and people who have problems with their brain function (dementia).  Pregnancy. Heat therapy should only be used on old, pre-existing, or long-lasting (chronic) injuries. Do not use heat therapy on new injuries unless directed by your health care provider. HOW TO USE HEAT THERAPY There are several different kinds of heat therapy, including:  Moist heat pack.  Warm water bath.  Hot water bottle.  Electric heating pad.  Heated gel pack.  Heated wrap.  Electric heating pad. Use the heat therapy method suggested by your health care provider. Follow your health care provider's instructions on when and how to use heat therapy. GENERAL HEAT THERAPY RECOMMENDATIONS  Do not sleep while using heat therapy. Only use heat therapy while you are awake.  Your skin may turn pink while using heat therapy. Do not use heat therapy if your skin turns red.  Do not use heat therapy if you have new pain.  High heat or long exposure to heat can cause burns. Be careful when using heat therapy to avoid burning your skin.  Do not use heat therapy on areas of your  skin that are already irritated, such as with a rash or sunburn. SEEK MEDICAL CARE IF:  You have blisters, redness, swelling, or numbness.  You have new pain.  Your pain is worse. MAKE SURE YOU:  Understand these instructions.  Will watch your condition.  Will get help right away if you are not doing well or get worse. Document Released: 08/11/2011 Document Revised: 10/03/2013 Document Reviewed: 07/12/2013 [facility] Patient Information 2015 [place], [place]. This information is not intended to replace advice given to you by your health care provider. Make sure you discuss any questions you have with your health care provider.  Muscle Cramps and Spasms Muscle cramps and spasms are when muscles tighten by themselves. They usually get better within minutes. Muscle cramps are painful. They are usually stronger and last longer than muscle spasms. Muscle spasms may or may not be painful. They can last a few seconds or much longer. HOME CARE  Drink enough fluid to keep your pee (urine) clear or pale yellow.  Massage, stretch, and relax the muscle.  Use a warm towel, heating pad, or warm shower water on tight muscles.  Place ice on the muscle if it is tender or in pain.  Put ice in a plastic bag.  Place a towel between your skin and the bag.  Leave the ice on for 15-20 minutes, 03-04 times a day.  Only take medicine as told by your doctor. GET HELP RIGHT AWAY IF:  Your cramps or spasms get worse, happen more often, or do not get better  with time. MAKE SURE YOU:  Understand these instructions.  Will watch your condition.  Will get help right away if you are not doing well or get worse. Document Released: 05/01/2008 Document Revised: 09/13/2012 Document Reviewed: 05/05/2012 [facility] Patient Information 2015 [place], [place]. This information is not intended to replace advice given to you by your health care provider. Make sure you discuss any questions you have with your health care  provider.  Cervical Sprain A cervical sprain is when the tissues (ligaments) that hold the neck bones in place stretch or tear. HOME CARE   Put ice on the injured area.  Put ice in a plastic bag.  Place a towel between your skin and the bag.  Leave the ice on for 15-20 minutes, 3-4 times a day.  You may have been given a collar to wear. This collar keeps your neck from moving while you heal.  Do not take the collar off unless told by your doctor.  If you have long hair, keep it outside of the collar.  Ask your doctor before changing the position of your collar. You may need to change its position over time to make it more comfortable.  If you are allowed to take off the collar for cleaning or bathing, follow your doctor's instructions on how to do it safely.  Keep your collar clean by wiping it with mild soap and water. Dry it completely. If the collar has removable pads, remove them every 1-2 days to hand wash them with soap and water. Allow them to air dry. They should be dry before you wear them in the collar.  Do not drive while wearing the collar.  Only take medicine as told by your doctor.  Keep all doctor visits as told.  Keep all physical therapy visits as told.  Adjust your work station so that you have good posture while you work.  Avoid positions and activities that make your problems worse.  Warm up and stretch before being active. GET HELP IF:  Your pain is not controlled with medicine.  You cannot take less pain medicine over time as planned.  Your activity level does not improve as expected. GET HELP RIGHT AWAY IF:   You are bleeding.  Your stomach is upset.  You have an allergic reaction to your medicine.  You develop new problems that you cannot explain.  You lose feeling (become numb) or you cannot move any part of your body (paralysis).  You have tingling or weakness in any part of your body.  Your symptoms get worse. Symptoms  include:  Pain, soreness, stiffness, puffiness (swelling), or a burning feeling in your neck.  Pain when your neck is touched.  Shoulder or upper back pain.  Limited ability to move your neck.  Headache.  Dizziness.  Your hands or arms feel week, lose feeling, or tingle.  Muscle spasms.  Difficulty swallowing or chewing. MAKE SURE YOU:   Understand these instructions.  Will watch your condition.  Will get help right away if you are not doing well or get worse. Document Released: 11/05/2007 Document Revised: 01/19/2013 Document Reviewed: 11/24/2012 [facility] Patient Information 2015 [place], [place]. This information is not intended to replace advice given to you by your health care provider. Make sure you discuss any questions you have with your health care provider.

## 2014-08-02 NOTE — ED Notes (Signed)
Patient transported to CT 

## 2014-08-02 NOTE — ED Provider Notes (Signed)
CSN: [id number]     Arrival date & time 08/02/14  1606 History   First MD Initiated Contact with Patient 08/02/14 1832     Chief Complaint  Patient presents with  . Headache     (Consider location/radiation/quality/duration/timing/severity/associated sxs/prior Treatment) Patient is a 59 y.o. male presenting with headaches.  Headache Associated symptoms: neck pain and neck stiffness   Associated symptoms: no abdominal pain, no congestion, no cough, no diarrhea, no dizziness, no fever, no nausea, no numbness, no sore throat, no vomiting and no weakness     59 yo M with PMHx of HTN, DM2, obesity, h/o gastric bypass, who presents with HA and elevated BP. Patient states his sx started spontaneously 4 days ago with onset of bilateral, but left greater than right, paraspinal neck pain. He denies any direct trauma to the area. The pain has persisted since onset and has progressively worsened and is now severe. He has tried OTC medicines for this and states the only thing that improves his HA is massaging the area. He was seen here yesterday for these sx, at which time he was given trigger point injections and percocet, robaxin with improvement in his sx. However, he states he returned home and awoke several hours later with return of severe, ongoing neck pain as well as elevated BP, up to 170s systolic. He describes the pain as a constant, aching, severe pain in his left paraspinal neck that radiates down across his neck. No midline pain. No recent fever or chills. No temporal pain, eye pain, or vision changes.  Past Medical History  Diagnosis Date  . Diabetes mellitus   . Hypertension   . Obesity   . Sleep apnea   . Gouty arthritis    Past Surgical History  Procedure Laterality Date  . Eye surgery    . Knee surgery    . Gastric bypass     Family History  Problem Relation Age of Onset  . Hypertension Sister   . Diabetes Mellitus II Sister    History  Substance Use Topics  . Smoking  status: Never Smoker   . Smokeless tobacco: Never Used  . Alcohol Use: No    Review of Systems  Constitutional: Negative for fever and chills.  HENT: Negative for congestion, rhinorrhea and sore throat.   Eyes: Negative for visual disturbance.  Respiratory: Negative for cough, shortness of breath and wheezing.   Cardiovascular: Negative for chest pain and leg swelling.  Gastrointestinal: Negative for nausea, vomiting, abdominal pain and diarrhea.  Genitourinary: Negative for dysuria.  Musculoskeletal: Positive for neck pain and neck stiffness. Negative for gait problem.  Skin: Negative for rash.  Allergic/Immunologic: Negative for immunocompromised state.  Neurological: Positive for headaches. Negative for dizziness, weakness and numbness.      Allergies  Penicillins; Amoxicillin; Niacin; and Penicillin g  Home Medications   Prior to Admission medications   Medication Sig Start Date End Date Taking? Authorizing Provider  ammonium lactate (LAC-HYDRIN) 12 % lotion Apply 1 application topically 2 (two) times daily as needed (legs). 08/30/13  Yes Delories Heinz, DPM  aspirin EC 81 MG tablet Take 81 mg by mouth daily.   Yes Historical Provider, MD  brinzolamide (AZOPT) 1 % ophthalmic suspension Place 1 drop into both eyes 2 (two) times daily.     Yes Historical Provider, MD  calcium carbonate (OS-CAL) 1250 (500 CA) MG chewable tablet Chew 1 tablet by mouth daily.   Yes Historical Provider, MD  carvedilol (COREG) 3.125 MG tablet  Take 3.125 mg by mouth 2 (two) times daily with a meal.   Yes Historical Provider, MD  colchicine 0.6 MG tablet TAKE 1 TABLET BY MOUTH EVERY DAY 04/05/14  Yes Donita Brooks, MD  CRESTOR 20 MG tablet TAKE 1 TABLET (20 MG TOTAL) BY MOUTH DAILY. 06/05/14  Yes Donita Brooks, MD  diclofenac sodium (VOLTAREN) 1 % GEL Apply 2 g topically 4 (four) times daily. 06/30/14  Yes Donita Brooks, MD  docusate sodium (COLACE) 100 MG capsule Take 100 mg by mouth 2 (two)  times daily.   Yes Historical Provider, MD  ferrous sulfate 325 (65 FE) MG EC tablet Take 325 mg by mouth 3 (three) times daily with meals.   Yes Historical Provider, MD  gabapentin (NEURONTIN) 300 MG capsule TAKE 2 CAPSULES BY MOUTH 3 TIMES A DAY 03/20/14  Yes Donita Brooks, MD  gabapentin (NEURONTIN) 300 MG capsule Take 300 mg by mouth 2 (two) times daily.   Yes Historical Provider, MD  glucose blood (ACCU-CHEK SMARTVIEW) test strip 1 each by Other route See admin instructions. Check blood sugar 3 times daily.   Yes Historical Provider, MD  latanoprost (XALATAN) 0.005 % ophthalmic solution Place 1 drop into the left eye at bedtime.    Yes Historical Provider, MD  Linaclotide Karlene Einstein) 145 MCG CAPS capsule Take 1 capsule (145 mcg total) by mouth daily. 04/07/14  Yes Donita Brooks, MD  losartan (COZAAR) 25 MG tablet Take 1 tablet (25 mg total) by mouth daily. 07/06/14  Yes Reather Littler, MD  metFORMIN (GLUCOPHAGE) 500 MG tablet TAKE 1 TABLET BY MOUTH 2 TIMES A DAY WITH A MEAL 07/24/14  Yes Reather Littler, MD  methocarbamol (ROBAXIN-750) 750 MG tablet Take 1 tablet (750 mg total) by mouth every 6 (six) hours as needed for muscle spasms. 08/02/14  Yes Olivia Mackie, MD  Multiple Vitamin (MULTIVITAMIN) capsule Take 1 capsule by mouth 2 (two) times daily.   Yes Historical Provider, MD  NEOMYCIN-POLYMYXIN-HYDROCORTISONE (CORTISPORIN) 1 % SOLN otic solution PLACE 4 DROPS INTO BOTH EARS 4 (FOUR) TIMES DAILY. 06/05/14  Yes Donita Brooks, MD  OVER THE COUNTER MEDICATION Take 1 each by mouth 3 (three) times daily. "Calcium Citrate Chewy Bite 500-500"   Yes Historical Provider, MD  OVER THE COUNTER MEDICATION Take 1 tablet by mouth at bedtime. "Strawberry Flavored Chewable Iron with Vitamin C 18 mg / 30 mg"   Yes Historical Provider, MD  oxyCODONE-acetaminophen (PERCOCET/ROXICET) 5-325 MG per tablet Take 1-2 tablets by mouth every 6 (six) hours as needed for severe pain. 08/02/14  Yes Olivia Mackie, MD  pantoprazole  (PROTONIX) 40 MG tablet TAKE 1 TABLET BY MOUTH TWICE A DAY 03/06/14  Yes Donita Brooks, MD  polyethylene glycol [facility] / GLYCOLAX) packet Take 17 g by mouth at bedtime.   Yes Historical Provider, MD  prednisoLONE acetate (PRED FORTE) 1 % ophthalmic suspension Place 1 drop into the right eye at bedtime.    Yes Historical Provider, MD  timolol (TIMOPTIC) 0.5 % ophthalmic solution Place 1 drop into both eyes 2 (two) times daily.   Yes Historical Provider, MD  ULORIC 40 MG tablet TAKE 1 TABLET BY MOUTH EVERY DAY 04/03/14  Yes Donita Brooks, MD  UNABLE TO FIND Dispense one pair of Diabetic Shoes. ICD E11.49, G62.9, B35.1, R60.9 06/12/14  Yes Donita Brooks, MD  vitamin B-12 (CYANOCOBALAMIN) 1000 MCG tablet Take 1,000 mcg by mouth. (Chewable tablet)   Yes Historical Provider, MD  sildenafil (VIAGRA) 100 MG tablet Take 1 tablet (100 mg total) by mouth daily as needed for erectile dysfunction. 04/14/14   Donato Schultz, MD   BP 169/80 mmHg  Pulse 62  Temp(Src) 97.7 F (36.5 C)  Resp 20  SpO2 99% Physical Exam  Constitutional: He is oriented to person, place, and time. He appears well-developed and well-nourished. No distress.  HENT:  Head: Normocephalic and atraumatic.  Mouth/Throat: No oropharyngeal exudate.  Eyes: Conjunctivae are normal. Pupils are equal, round, and reactive to light.  Neck: Normal range of motion. Neck supple.    Marked TTP to paracervical muscles bilaterally with trigger point areas of tenderness at posterior occiput. Increased tone noted. No deformity or swelling. No midline tenderness, step-offs, or deformity.  Cardiovascular: Normal rate, normal heart sounds and intact distal pulses.  Exam reveals no friction rub.   No murmur heard. Pulmonary/Chest: Effort normal and breath sounds normal. No respiratory distress. He has no wheezes. He has no rales.  Abdominal: Soft. Bowel sounds are normal. He exhibits no distension. There is no tenderness.  Musculoskeletal: He  exhibits no edema.  Neurological: He is alert and oriented to person, place, and time. He has normal strength. He displays normal reflexes. No cranial nerve deficit or sensory deficit. He exhibits normal muscle tone. Coordination normal. GCS eye subscore is 4. GCS verbal subscore is 5. GCS motor subscore is 6.  Skin: Skin is warm. No rash noted.  Nursing note and vitals reviewed.   ED Course  Procedures (including critical care time) Labs Review Labs Reviewed  BASIC METABOLIC PANEL - Abnormal; Notable for the following:    Glucose, Bld 150 (*)    BUN 46 (*)    Creatinine, Ser 1.57 (*)    GFR calc non Af Amer 47 (*)    GFR calc Af Amer 54 (*)    All other components within normal limits  I-STAT CHEM 8, ED - Abnormal; Notable for the following:    BUN 46 (*)    Creatinine, Ser 1.60 (*)    Glucose, Bld 153 (*)    Calcium, Ion 1.30 (*)    All other components within normal limits    Imaging Review Ct Angio Head W/cm &/or Wo Cm  08/03/2014   CLINICAL DATA:  LEFT neck pain, headache. History of hypertension, diabetes.  EXAM: CT ANGIOGRAPHY HEAD AND NECK  TECHNIQUE: Multidetector CT imaging of the head and neck was performed using the standard protocol during bolus administration of intravenous contrast. Multiplanar CT image reconstructions and MIPs were obtained to evaluate the vascular anatomy. Carotid stenosis measurements (when applicable) are obtained utilizing NASCET criteria, using the distal internal carotid diameter as the denominator.  CONTRAST:  OMNIPAQUE IOHEXOL 350 MG/ML SOLN  COMPARISON:  CT of the head April 01, 2013  FINDINGS: CT HEAD  Moderate ventriculomegaly, advanced for age is a stable from prior imaging, likely on the basis of global parenchymal brain volume loss as there is overall enlargement of the cerebral sulci and cerebellar folia, cavum septum pellucidum. No intraparenchymal hemorrhage, mass effect nor midline shift. Patchy supratentorial white matter  hypodensities are advanced for patient's age and though non-specific suggest sequelae of chronic small vessel ischemic disease, stable. No acute large vascular territory infarcts. No abnormal intracranial enhancement.  No abnormal extra-axial fluid collections. Basal cisterns are patent. Severe calcific atherosclerosis of the carotid siphons.  No skull fracture. The included ocular globes and orbital contents are non-suspicious, bilateral ocular lens implants. RIGHT apparent scleral banding. The  mastoid aircells and included paranasal sinuses are well-aerated. Patient is edentulous.  CTA NECK  Normal appearance of the thoracic arch, 2 vessel arch is a normal variant. Mild calcific atherosclerosis. The origins of the innominate, left Common carotid artery and subclavian artery are widely patent.  Bilateral Common carotid arteries are widely patent, coursing in a straight line fashion. Normal appearance of the carotid bifurcations without hemodynamically significant stenosis by NASCET criteria. Eccentric 2-3 mm calcific atherosclerosis in intimal thickening at the carotid bulbs extending to the internal carotid artery origins. Normal appearance of the included internal carotid arteries.  Left vertebral artery is dominant. Mild diffuse luminal irregularity of the RIGHT vertebral artery without discrete dissection flap. Mild calcific atherosclerosis of the origins bilaterally. No high-grade stenosis.  No no pseudoaneurysm.  No contrast extravasation.  Soft tissues are not acute. No acute osseous process though bone windows have not been submitted.  CTA HEAD  Anterior circulation: Normal appearance of the cervical internal carotid arteries, petrous Optiray calcific atherosclerosis of the carotid siphons with mid to high-grade stenosis bilaterally suspected. Cavernous and supra clinoid internal carotid arteries. Widely patent anterior communicating artery. Moderate luminal irregularity of the anterior and middle cerebral  artery mid to distal segments without large vessel occlusion.  Posterior circulation: LEFT vertebral artery is dominant. Mild luminal irregularity and eccentric calcific atherosclerosis the LEFT V4 segment, with luminal regularity extending to the vertebrobasilar junction. This may be accentuated by beam hardening artifact from skullbase. Basilar artery is widely patent, patent main branch vessels. Small RIGHT posterior communicating artery. Mild luminal irregularity of the LEFT greater than RIGHT posterior cerebral arteries.  No large vessel occlusion, contrast extravasation or aneurysm within the anterior nor posterior circulation.  IMPRESSION: CT HEAD:  No acute intracranial process.  Moderate parenchymal brain volume loss, advanced for age. Moderate white matter changes suggest chronic small vessel ischemic disease, advanced for age cyst similar to prior imaging.  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  Calcific atherosclerosis without hemodynamically significant stenosis.  CTA HEAD: Luminal irregularity of the LEFT V4 segment may reflect minimal intimal injury without dissection flap or flow limiting stenosis.  Moderate luminal irregularity of the cerebral arteries most consistent with atherosclerosis.  Severe calcific atherosclerosis of bilateral carotid siphons with probable mid to high-grade stenosis bilaterally. RIGHT posterior communicating artery present.   Electronically Signed   By: Awilda Metro   On: 08/03/2014 00:53   Ct Angio Neck W/cm &/or Wo/cm  08/03/2014   CLINICAL DATA:  LEFT neck pain, headache. History of hypertension, diabetes.  EXAM: CT ANGIOGRAPHY HEAD AND NECK  TECHNIQUE: Multidetector CT imaging of the head and neck was performed using the standard protocol during bolus administration of intravenous contrast. Multiplanar CT image reconstructions and MIPs were  obtained to evaluate the vascular anatomy. Carotid stenosis measurements (when applicable) are obtained utilizing NASCET criteria, using the distal internal carotid diameter as the denominator.  CONTRAST:  OMNIPAQUE IOHEXOL 350 MG/ML SOLN  COMPARISON:  CT of the head April 01, 2013  FINDINGS: CT HEAD  Moderate ventriculomegaly, advanced for age is a stable from prior imaging, likely on the basis of global parenchymal brain volume loss as there is overall enlargement of the cerebral sulci and cerebellar folia, cavum septum pellucidum. No intraparenchymal hemorrhage, mass effect nor midline shift. Patchy supratentorial white matter hypodensities are advanced for patient's age and though non-specific suggest sequelae of chronic small vessel ischemic  disease, stable. No acute large vascular territory infarcts. No abnormal intracranial enhancement.  No abnormal extra-axial fluid collections. Basal cisterns are patent. Severe calcific atherosclerosis of the carotid siphons.  No skull fracture. The included ocular globes and orbital contents are non-suspicious, bilateral ocular lens implants. RIGHT apparent scleral banding. The mastoid aircells and included paranasal sinuses are well-aerated. Patient is edentulous.  CTA NECK  Normal appearance of the thoracic arch, 2 vessel arch is a normal variant. Mild calcific atherosclerosis. The origins of the innominate, left Common carotid artery and subclavian artery are widely patent.  Bilateral Common carotid arteries are widely patent, coursing in a straight line fashion. Normal appearance of the carotid bifurcations without hemodynamically significant stenosis by NASCET criteria. Eccentric 2-3 mm calcific atherosclerosis in intimal thickening at the carotid bulbs extending to the internal carotid artery origins. Normal appearance of the included internal carotid arteries.  Left vertebral artery is dominant. Mild diffuse luminal irregularity of the RIGHT vertebral artery  without discrete dissection flap. Mild calcific atherosclerosis of the origins bilaterally. No high-grade stenosis.  No no pseudoaneurysm.  No contrast extravasation.  Soft tissues are not acute. No acute osseous process though bone windows have not been submitted.  CTA HEAD  Anterior circulation: Normal appearance of the cervical internal carotid arteries, petrous Optiray calcific atherosclerosis of the carotid siphons with mid to high-grade stenosis bilaterally suspected. Cavernous and supra clinoid internal carotid arteries. Widely patent anterior communicating artery. Moderate luminal irregularity of the anterior and middle cerebral artery mid to distal segments without large vessel occlusion.  Posterior circulation: LEFT vertebral artery is dominant. Mild luminal irregularity and eccentric calcific atherosclerosis the LEFT V4 segment, with luminal regularity extending to the vertebrobasilar junction. This may be accentuated by beam hardening artifact from skullbase. Basilar artery is widely patent, patent main branch vessels. Small RIGHT posterior communicating artery. Mild luminal irregularity of the LEFT greater than RIGHT posterior cerebral arteries.  No large vessel occlusion, contrast extravasation or aneurysm within the anterior nor posterior circulation.  IMPRESSION: CT HEAD:  No acute intracranial process.  Moderate parenchymal brain volume loss, advanced for age. Moderate white matter changes suggest chronic small vessel ischemic disease, advanced for age cyst similar to prior imaging.  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  Calcific atherosclerosis without hemodynamically significant stenosis.  CTA HEAD: Luminal irregularity of the LEFT V4 segment may reflect minimal intimal injury without dissection flap or flow limiting stenosis.  Moderate luminal irregularity of the cerebral  arteries most consistent with atherosclerosis.  Severe calcific atherosclerosis of bilateral carotid siphons with probable mid to high-grade stenosis bilaterally. RIGHT posterior communicating artery present.   Electronically Signed   By: Awilda Metro   On: 08/03/2014 00:53     EKG Interpretation None      MDM   59 yo M with PMHx of HTN, DM2, obesity, h/o gastric bypass, who presents with HA and elevated BP. See HPI above. On arrival, T 97.56F, HR 57, RR 18, BP 163/69, satting 100% on RA. Exam as above, remarkable for well-appearing male in NAD, with TTP on paracervical muscles. Neuro exam intact.  Pt's exam is most c/w paracervical muscle spasm/pain, with subsequent TTP. Pt did have improvement with local trigger point injection which is c/w this. However, pt now c/o general HA and elevated BP, which in this older male with known PVD risk factors is concerning for possible dissection, HTN-related HA, or other  vascular abnormality. Will give percocet, valium for pain control and muscle spasm and plan for CTA Head/Neck. Pt has h/o mild CKD - will check BMP/GFR, and give 1L for pre-contrast hydration. No recent trauma and do not suspect bony pathology at this time. No fever, chills, or infectious symptoms. HA was not acute in onset and do not suspect SAH. Will f/u imaging, pain control, and re-assess. No neuro deficits at this time, no s/s TIA or CVA.  Chem panel remarkable for mild CKD, at baseline, with GFR > 40. Will give 1L NS prior to and after scan for pre- and post-scan hydration with reduced contrast load. Pain improved with valium.   CTA remarkable for luminal irregularity of right vertebral artery, as well as luminal irregularity of left V4 segment c/f intimal injury. These findings may be chronic but in setting of neck pain and HA, will consult Neurology. Pt remains well-appearing with non-focal neuro exam.   Neuro recommends MR C-Spine. They will evaluate in ED for further reccs.    Clinical Impression: 1. Other headache syndrome   2. Neck pain   3. Essential hypertension        Disposition: Pending  Condition: Stable  Pt seen in conjunction with Dr. Ignacia Fellingancour      Rafi Kenneth, MD 08/03/14 1201  Glynn OctaveStephen Rancour, MD 08/03/14 [phone]

## 2014-08-02 NOTE — ED Notes (Signed)
Patient presents stating his neck started hurting on the left side and now the pain is all the way around his neck.  States he cannot turn his head or look up or down.  Denies injury.  Also c/o bilateral lower leg pain burning in nature

## 2014-08-02 NOTE — ED Notes (Signed)
Pt st's he woke up this am with headache and HTN.  St's he took his HTN meds but continues to have headache and high blood pressure

## 2014-08-03 ENCOUNTER — Observation Stay (HOSPITAL_COMMUNITY): Payer: Medicaid Other

## 2014-08-03 ENCOUNTER — Ambulatory Visit: Payer: Medicaid Other | Admitting: Podiatrist

## 2014-08-03 DIAGNOSIS — I10 Essential (primary) hypertension: Secondary | ICD-10-CM

## 2014-08-03 DIAGNOSIS — R519 Headache, unspecified: Secondary | ICD-10-CM | POA: Diagnosis present

## 2014-08-03 DIAGNOSIS — E1165 Type 2 diabetes mellitus with hyperglycemia: Secondary | ICD-10-CM

## 2014-08-03 DIAGNOSIS — N183 Chronic kidney disease, stage 3 (moderate): Secondary | ICD-10-CM

## 2014-08-03 DIAGNOSIS — G44209 Tension-type headache, unspecified, not intractable: Secondary | ICD-10-CM

## 2014-08-03 DIAGNOSIS — R51 Headache: Secondary | ICD-10-CM

## 2014-08-03 DIAGNOSIS — E785 Hyperlipidemia, unspecified: Secondary | ICD-10-CM

## 2014-08-03 LAB — CBC
HCT: 37.6 % — ABNORMAL LOW (ref 39.0–52.0)
HEMOGLOBIN: 12.7 g/dL — AB (ref 13.0–17.0)
MCH: 27.1 pg (ref 26.0–34.0)
MCHC: 33.8 g/dL (ref 30.0–36.0)
MCV: 80.3 fL (ref 78.0–100.0)
Platelets: 181 10*3/uL (ref 150–400)
RBC: 4.68 MIL/uL (ref 4.22–5.81)
RDW: 13.8 % (ref 11.5–15.5)
WBC: 8.5 10*3/uL (ref 4.0–10.5)

## 2014-08-03 LAB — CBG MONITORING, ED
GLUCOSE-CAPILLARY: 134 mg/dL — AB (ref 70–99)
GLUCOSE-CAPILLARY: 119 mg/dL — AB (ref 70–99)

## 2014-08-03 LAB — GLUCOSE, CAPILLARY
GLUCOSE-CAPILLARY: 132 mg/dL — AB (ref 70–99)
Glucose-Capillary: 110 mg/dL — ABNORMAL HIGH (ref 70–99)

## 2014-08-03 MED ORDER — SODIUM CHLORIDE 0.9 % IV SOLN
INTRAVENOUS | Status: DC
  Administered 2014-08-03: 18:00:00 via INTRAVENOUS

## 2014-08-03 MED ORDER — INSULIN ASPART 100 UNIT/ML ~~LOC~~ SOLN: 0.0000 [IU] | Freq: Three times a day (TID) | SUBCUTANEOUS | Status: DC

## 2014-08-03 MED ORDER — CALCIUM CARBONATE 1250 (500 CA) MG PO TABS
1250.0000 mg | ORAL_TABLET | Freq: Every day | ORAL | Status: DC
  Administered 2014-08-03 – 2014-08-04 (×2): 1250 mg via ORAL
  Filled 2014-08-03 (×2): qty 1

## 2014-08-03 MED ORDER — POLYETHYLENE GLYCOL 3350 17 G PO PACK
17.0000 g | PACK | Freq: Every day | ORAL | Status: DC
  Administered 2014-08-03: 17 g via ORAL
  Filled 2014-08-03 (×2): qty 1

## 2014-08-03 MED ORDER — FEBUXOSTAT 40 MG PO TABS
40.0000 mg | ORAL_TABLET | Freq: Every day | ORAL | Status: DC
  Administered 2014-08-03 – 2014-08-04 (×2): 40 mg via ORAL
  Filled 2014-08-03 (×2): qty 1

## 2014-08-03 MED ORDER — DOCUSATE SODIUM 100 MG PO CAPS
100.0000 mg | ORAL_CAPSULE | Freq: Two times a day (BID) | ORAL | Status: DC
  Administered 2014-08-03 – 2014-08-04 (×3): 100 mg via ORAL
  Filled 2014-08-03 (×4): qty 1

## 2014-08-03 MED ORDER — ASPIRIN EC 81 MG PO TBEC
81.0000 mg | DELAYED_RELEASE_TABLET | Freq: Every day | ORAL | Status: DC
  Administered 2014-08-04: 81 mg via ORAL
  Filled 2014-08-03: qty 1

## 2014-08-03 MED ORDER — CARVEDILOL 3.125 MG PO TABS
3.1250 mg | ORAL_TABLET | Freq: Once | ORAL | Status: AC
  Administered 2014-08-03: 3.125 mg via ORAL
  Filled 2014-08-03: qty 1

## 2014-08-03 MED ORDER — CARVEDILOL 3.125 MG PO TABS
3.1250 mg | ORAL_TABLET | Freq: Two times a day (BID) | ORAL | Status: DC
  Administered 2014-08-03 – 2014-08-04 (×3): 3.125 mg via ORAL
  Filled 2014-08-03 (×5): qty 1

## 2014-08-03 MED ORDER — LABETALOL HCL 5 MG/ML IV SOLN
10.0000 mg | INTRAVENOUS | Status: DC | PRN
  Administered 2014-08-03: 10 mg via INTRAVENOUS
  Filled 2014-08-03 (×3): qty 4

## 2014-08-03 MED ORDER — INSULIN ASPART 100 UNIT/ML ~~LOC~~ SOLN: 0.0000 [IU] | Freq: Every day | SUBCUTANEOUS | Status: DC

## 2014-08-03 MED ORDER — LATANOPROST 0.005 % OP SOLN
1.0000 [drp] | Freq: Every day | OPHTHALMIC | Status: DC
  Administered 2014-08-03: 1 [drp] via OPHTHALMIC
  Filled 2014-08-03: qty 2.5

## 2014-08-03 MED ORDER — BRINZOLAMIDE 1 % OP SUSP
1.0000 [drp] | Freq: Two times a day (BID) | OPHTHALMIC | Status: DC
  Administered 2014-08-03 – 2014-08-04 (×3): 1 [drp] via OPHTHALMIC
  Filled 2014-08-03: qty 10

## 2014-08-03 MED ORDER — LORAZEPAM 1 MG PO TABS: 1.0000 mg | ORAL_TABLET | Freq: Once | ORAL | Status: DC

## 2014-08-03 MED ORDER — ASPIRIN 325 MG PO TABS
325.0000 mg | ORAL_TABLET | Freq: Every day | ORAL | Status: DC
  Administered 2014-08-03: 325 mg via ORAL
  Filled 2014-08-03: qty 1

## 2014-08-03 MED ORDER — ADULT MULTIVITAMIN W/MINERALS CH
1.0000 | ORAL_TABLET | Freq: Two times a day (BID) | ORAL | Status: DC
  Administered 2014-08-03 – 2014-08-04 (×3): 1 via ORAL
  Filled 2014-08-03 (×6): qty 1

## 2014-08-03 MED ORDER — ROSUVASTATIN CALCIUM 20 MG PO TABS
20.0000 mg | ORAL_TABLET | Freq: Every day | ORAL | Status: DC
  Administered 2014-08-03 – 2014-08-04 (×2): 20 mg via ORAL
  Filled 2014-08-03 (×2): qty 1

## 2014-08-03 MED ORDER — DIAZEPAM 2 MG PO TABS: 2.0000 mg | ORAL_TABLET | Freq: Two times a day (BID) | ORAL | Status: DC | PRN

## 2014-08-03 MED ORDER — AMMONIUM LACTATE 12 % EX LOTN
1.0000 "application " | TOPICAL_LOTION | Freq: Two times a day (BID) | CUTANEOUS | Status: DC | PRN
  Filled 2014-08-03: qty 400

## 2014-08-03 MED ORDER — TIMOLOL MALEATE 0.5 % OP SOLN
1.0000 [drp] | Freq: Two times a day (BID) | OPHTHALMIC | Status: DC
  Administered 2014-08-03 – 2014-08-04 (×3): 1 [drp] via OPHTHALMIC
  Filled 2014-08-03: qty 5

## 2014-08-03 MED ORDER — DICLOFENAC SODIUM 1 % TD GEL
2.0000 g | Freq: Four times a day (QID) | TRANSDERMAL | Status: DC
  Administered 2014-08-03 – 2014-08-04 (×3): 2 g via TOPICAL
  Filled 2014-08-03 (×2): qty 100

## 2014-08-03 MED ORDER — METHOCARBAMOL 500 MG PO TABS
750.0000 mg | ORAL_TABLET | Freq: Four times a day (QID) | ORAL | Status: DC | PRN
  Administered 2014-08-03: 750 mg via ORAL
  Filled 2014-08-03: qty 2

## 2014-08-03 MED ORDER — PREDNISOLONE ACETATE 1 % OP SUSP
1.0000 [drp] | Freq: Every day | OPHTHALMIC | Status: DC
  Administered 2014-08-03: 1 [drp] via OPHTHALMIC
  Filled 2014-08-03: qty 1

## 2014-08-03 MED ORDER — OXYCODONE-ACETAMINOPHEN 5-325 MG PO TABS
1.0000 | ORAL_TABLET | Freq: Once | ORAL | Status: AC
  Administered 2014-08-03: 1 via ORAL
  Filled 2014-08-03: qty 1

## 2014-08-03 MED ORDER — LORAZEPAM 2 MG/ML IJ SOLN
0.5000 mg | Freq: Once | INTRAMUSCULAR | Status: AC | PRN
  Administered 2014-08-03: 0.5 mg via INTRAVENOUS
  Filled 2014-08-03: qty 1

## 2014-08-03 MED ORDER — HEPARIN SODIUM (PORCINE) 5000 UNIT/ML IJ SOLN
5000.0000 [IU] | Freq: Three times a day (TID) | INTRAMUSCULAR | Status: DC
  Administered 2014-08-03 – 2014-08-04 (×4): 5000 [IU] via SUBCUTANEOUS
  Filled 2014-08-03 (×5): qty 1

## 2014-08-03 MED ORDER — PANTOPRAZOLE SODIUM 40 MG PO TBEC
40.0000 mg | DELAYED_RELEASE_TABLET | Freq: Two times a day (BID) | ORAL | Status: DC
  Administered 2014-08-03 – 2014-08-04 (×3): 40 mg via ORAL
  Filled 2014-08-03 (×4): qty 1

## 2014-08-03 MED ORDER — OXYCODONE-ACETAMINOPHEN 5-325 MG PO TABS
1.0000 | ORAL_TABLET | Freq: Four times a day (QID) | ORAL | Status: DC | PRN
  Administered 2014-08-03 – 2014-08-04 (×2): 2 via ORAL
  Filled 2014-08-03 (×2): qty 2

## 2014-08-03 MED ORDER — FENTANYL CITRATE 0.05 MG/ML IJ SOLN
25.0000 ug | INTRAMUSCULAR | Status: DC | PRN
  Administered 2014-08-03: 25 ug via INTRAVENOUS
  Filled 2014-08-03: qty 2

## 2014-08-03 MED ORDER — COLCHICINE 0.6 MG PO TABS
0.6000 mg | ORAL_TABLET | Freq: Every day | ORAL | Status: DC
  Administered 2014-08-03: 0.6 mg via ORAL
  Filled 2014-08-03: qty 1

## 2014-08-03 MED ORDER — LOSARTAN POTASSIUM 25 MG PO TABS
25.0000 mg | ORAL_TABLET | Freq: Every day | ORAL | Status: DC
  Administered 2014-08-03: 25 mg via ORAL
  Filled 2014-08-03: qty 1

## 2014-08-03 MED ORDER — LINACLOTIDE 145 MCG PO CAPS
145.0000 ug | ORAL_CAPSULE | Freq: Every day | ORAL | Status: DC
  Administered 2014-08-03 – 2014-08-04 (×2): 145 ug via ORAL
  Filled 2014-08-03 (×2): qty 1

## 2014-08-03 MED ORDER — SODIUM CHLORIDE 0.9 % IV BOLUS (SEPSIS)
500.0000 mL | Freq: Once | INTRAVENOUS | Status: AC
  Administered 2014-08-03: 500 mL via INTRAVENOUS

## 2014-08-03 MED ORDER — GABAPENTIN 300 MG PO CAPS
300.0000 mg | ORAL_CAPSULE | Freq: Two times a day (BID) | ORAL | Status: DC
  Administered 2014-08-03 – 2014-08-04 (×3): 300 mg via ORAL
  Filled 2014-08-03 (×5): qty 1

## 2014-08-03 MED ORDER — METFORMIN HCL 500 MG PO TABS
500.0000 mg | ORAL_TABLET | Freq: Two times a day (BID) | ORAL | Status: DC
  Filled 2014-08-03: qty 1

## 2014-08-03 NOTE — ED Notes (Signed)
Called pharmacy to send up all meds

## 2014-08-03 NOTE — ED Notes (Signed)
MD at bedside.  Pt eating breakfast.

## 2014-08-03 NOTE — Discharge Instructions (Signed)
- DO NOT take your new prescription (Valium) with your pain medications (Percocet, Vicodin, oxycodone, hydrocodone) or at the same time as your muscle relaxant, methocarbamol (Robaxin) - DO NOT operative any heavy machinery while taking these medications - DO NOT drink alcohol while taking the Valium or your pain medications - Return to the ED with any worsening of symptoms - Follow-up with your PCP in 2-3 days   Cervical Sprain A cervical sprain is an injury in the neck in which the strong, fibrous tissues (ligaments) that connect your neck bones stretch or tear. Cervical sprains can range from mild to severe. Severe cervical sprains can cause the neck vertebrae to be unstable. This can lead to damage of the spinal cord and can result in serious nervous system problems. The amount of time it takes for a cervical sprain to get better depends on the cause and extent of the injury. Most cervical sprains heal in 1 to 3 weeks. CAUSES  Severe cervical sprains may be caused by:   Contact sport injuries (such as from football, rugby, wrestling, hockey, auto racing, gymnastics, diving, martial arts, or boxing).   Motor vehicle collisions.   Whiplash injuries. This is an injury from a sudden forward and backward whipping movement of the head and neck.  Falls.  Mild cervical sprains may be caused by:   Being in an awkward position, such as while cradling a telephone between your ear and shoulder.   Sitting in a chair that does not offer proper support.   Working at a poorly Marketing executive station.   Looking up or down for long periods of time.  SYMPTOMS   Pain, soreness, stiffness, or a burning sensation in the front, back, or sides of the neck. This discomfort may develop immediately after the injury or slowly, 24 hours or more after the injury.   Pain or tenderness directly in the middle of the back of the neck.   Shoulder or upper back pain.   Limited ability to move the  neck.   Headache.   Dizziness.   Weakness, numbness, or tingling in the hands or arms.   Muscle spasms.   Difficulty swallowing or chewing.   Tenderness and swelling of the neck.  DIAGNOSIS  Most of the time your health care provider can diagnose a cervical sprain by taking your history and doing a physical exam. Your health care provider will ask about previous neck injuries and any known neck problems, such as arthritis in the neck. X-rays may be taken to find out if there are any other problems, such as with the bones of the neck. Other tests, such as a CT scan or MRI, may also be needed.  TREATMENT  Treatment depends on the severity of the cervical sprain. Mild sprains can be treated with rest, keeping the neck in place (immobilization), and pain medicines. Severe cervical sprains are immediately immobilized. Further treatment is done to help with pain, muscle spasms, and other symptoms and may include:  Medicines, such as pain relievers, numbing medicines, or muscle relaxants.   Physical therapy. This may involve stretching exercises, strengthening exercises, and posture training. Exercises and improved posture can help stabilize the neck, strengthen muscles, and help stop symptoms from returning.  HOME CARE INSTRUCTIONS   Put ice on the injured area.   Put ice in a plastic bag.   Place a towel between your skin and the bag.   Leave the ice on for 15-20 minutes, 3-4 times a day.  If your injury was severe, you may have been given a cervical collar to wear. A cervical collar is a two-piece collar designed to keep your neck from moving while it heals.  Do not remove the collar unless instructed by your health care provider.  If you have long hair, keep it outside of the collar.  Ask your health care provider before making any adjustments to your collar. Minor adjustments may be required over time to improve comfort and reduce pressure on your chin or on the back  of your head.  Ifyou are allowed to remove the collar for cleaning or bathing, follow your health care provider's instructions on how to do so safely.  Keep your collar clean by wiping it with mild soap and water and drying it completely. If the collar you have been given includes removable pads, remove them every 1-2 days and hand wash them with soap and water. Allow them to air dry. They should be completely dry before you wear them in the collar.  If you are allowed to remove the collar for cleaning and bathing, wash and dry the skin of your neck. Check your skin for irritation or sores. If you see any, tell your health care provider.  Do not drive while wearing the collar.   Only take over-the-counter or prescription medicines for pain, discomfort, or fever as directed by your health care provider.   Keep all follow-up appointments as directed by your health care provider.   Keep all physical therapy appointments as directed by your health care provider.   Make any needed adjustments to your workstation to promote good posture.   Avoid positions and activities that make your symptoms worse.   Warm up and stretch before being active to help prevent problems.  SEEK MEDICAL CARE IF:   Your pain is not controlled with medicine.   You are unable to decrease your pain medicine over time as planned.   Your activity level is not improving as expected.  SEEK IMMEDIATE MEDICAL CARE IF:   You develop any bleeding.  You develop stomach upset.  You have signs of an allergic reaction to your medicine.   Your symptoms get worse.   You develop new, unexplained symptoms.   You have numbness, tingling, weakness, or paralysis in any part of your body.  MAKE SURE YOU:   Understand these instructions.  Will watch your condition.  Will get help right away if you are not doing well or get worse. Document Released: 03/16/2007 Document Revised: 05/24/2013 Document Reviewed:  11/24/2012 [facility] Patient Information 2015 [place] [employer], [place]. This information is not intended to replace advice given to you by your health care provider. Make sure you discuss any questions you have with your health care provider.

## 2014-08-03 NOTE — ED Notes (Signed)
Pt daughter at bedside and states if pt is just placed in observation and all of test have been completed can pt be discharged? Pt states she will return from clinicals around 3pm and would like to speak with admitting doctor to see if care can be given at home by mother and daughter, it it necessary fot patient to be moved upstairs.

## 2014-08-03 NOTE — Consult Note (Signed)
Consult Reason for Consult:headache, neck pain, elevated blood pressure Referring Physician: Dr Bebe ShaggyWickline  CC: neck pain and headache  HPI: Alexander Hubbard is an 59 y.o. male PMHx of HTN, DM2,  who presents with HA and elevated BP. Patient states his sx started spontaneously 4 days ago with onset of bilateral, paraspinal neck pain. The pain has persisted since onset and has progressively worsened. Describes pain as worse when any pressure is placed on back of head (such as with a pillow).  He has tried OTC medicines for this and states the only thing that improves his HA is massaging the area. He was seen here yesterday for these sx, at which time he was given trigger point injections and percocet, robaxin with improvement in his sx. However, he states he returned home and awoke several hours later with return of severe, ongoing neck pain as well as elevated BP, which is abnormal for him.   No focal weakness, sensory changes. No vision or speech change. No history of trauma to head or neck. 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 Luminal irregularity of the left V4 segment may represent intimal injury without dissection flap or flow limiting stenosis. Blood pressure in ED noted to elevated with SBP in 170s to 190s.   Past Medical History  Diagnosis Date  . Diabetes mellitus   . Hypertension   . Obesity   . Sleep apnea   . Gouty arthritis     Past Surgical History  Procedure Laterality Date  . Eye surgery    . Knee surgery    . Gastric bypass      Family History  Problem Relation Age of Onset  . Hypertension Sister   . Diabetes Mellitus II Sister     Social History:  reports that he has never smoked. He has never used smokeless tobacco. He reports that he does not drink alcohol or use illicit drugs.  Allergies  Allergen Reactions  . Penicillins Swelling and Rash  .  Amoxicillin Swelling and Rash  . Niacin Itching and Other (See Comments)    Other reaction(s): Flushing (ALLERGY/intolerance)  . Penicillin G Rash    Medications: I have reviewed the patient's current medications.   ROS: Out of a complete 14 system review, the patient complains of only the following symptoms, and all other reviewed systems are negative. + neck pain, headache  Physical Examination: Filed Vitals:   08/03/14 0200  BP: 192/61  Pulse: 71  Temp:   Resp:    Physical Exam  Constitutional: He appears well-developed and well-nourished.  Psych: Affect appropriate to situation Eyes: No scleral injection HENT: No OP obstruction Head: Normocephalic. Tenderness to palpation over lesser occipital nerve on right side which reproduces pain Cardiovascular: Normal rate and regular rhythm.  Respiratory: Effort normal and breath sounds normal.  GI: Soft. Bowel sounds are normal. No distension. There is no tenderness.  Skin: WDI  Neurologic Examination Mental Status: Alert, oriented, thought content appropriate.  Speech fluent without evidence of aphasia.  Able to follow 3 step commands without difficulty. Cranial Nerves: II: funduscopic exam wnl bilaterally, visual fields grossly normal, pupils equal, round, reactive to light and accommodation III,IV, VI: ptosis not present, extra-ocular motions intact bilaterally V,VII: smile symmetric, facial light touch sensation normal bilaterally VIII: hearing normal bilaterally IX,X: gag reflex present XI: trapezius strength/neck flexion strength normal bilaterally XII: tongue strength normal  Motor: Right : Upper extremity  Left:     Upper extremity 5/5 deltoid       5/5 deltoid 5/5 biceps      5/5 biceps  5/5 triceps      5/5 triceps 5/5 hand grip      5/5 hand grip  Lower extremity     Lower extremity 5/5 hip flexor      5/5 hip flexor 5/5 quadricep      5/5 quadriceps  5/5 hamstrings     5/5 hamstrings 5/5 plantar  flexion       5/5 plantar flexion 5/5 plantar extension     5/5 plantar extension Tone and bulk:normal tone throughout; no atrophy noted Sensory: Pinprick and light touch intact throughout, bilaterally Deep Tendon Reflexes: 2+ and symmetric throughout Plantars: Right: downgoing   Left: downgoing Cerebellar: normal finger-to-nose, and normal heel-to-shin test Gait: deferred  Laboratory Studies:   Basic Metabolic Panel:  Recent Labs Lab 08/02/14 1942 08/02/14 2003  NA 140 137  K 4.4 4.2  CL 102 103  CO2  --  28  GLUCOSE 153* 150*  BUN 46* 46*  CREATININE 1.60* 1.57*  CALCIUM  --  9.9    Liver Function Tests: No results for input(s): AST, ALT, ALKPHOS, BILITOT, PROT, ALBUMIN in the last 168 hours. No results for input(s): LIPASE, AMYLASE in the last 168 hours. No results for input(s): AMMONIA in the last 168 hours.  CBC:  Recent Labs Lab 08/02/14 1942 08/03/14 0137  WBC  --  8.5  HGB 14.3 12.7*  HCT 42.0 37.6*  MCV  --  80.3  PLT  --  181    Cardiac Enzymes: No results for input(s): CKTOTAL, CKMB, CKMBINDEX, TROPONINI in the last 168 hours.  BNP: Invalid input(s): POCBNP  CBG: No results for input(s): GLUCAP in the last 168 hours.  Microbiology: Results for orders placed or performed during the hospital encounter of 03/29/13  Culture, blood (routine x 2)     Status: None   Collection Time: 03/30/13  8:48 AM  Result Value Ref Range Status   Specimen Description BLOOD LEFT HAND  Final   Special Requests BOTTLES DRAWN AEROBIC ONLY 10CC  Final   Culture  Setup Time   Final    03/30/2013 16:21 Performed at [facility]   Culture   Final    NO GROWTH 5 DAYS Performed at [facility]   Report Status 04/05/2013 FINAL  Final  Culture, blood (routine x 2)     Status: None   Collection Time: 03/30/13 10:05 AM  Result Value Ref Range Status   Specimen Description BLOOD RIGHT ARM  Final   Special Requests BOTTLES DRAWN AEROBIC ONLY 10CC  Final    Culture  Setup Time   Final    03/30/2013 17:36 Performed at [facility]   Culture   Final    NO GROWTH 5 DAYS Performed at [facility]   Report Status 04/05/2013 FINAL  Final  Clostridium Difficile by PCR     Status: None   Collection Time: 03/30/13 12:18 PM  Result Value Ref Range Status   C difficile by pcr NEGATIVE NEGATIVE Final  MRSA PCR Screening     Status: None   Collection Time: 03/31/13  1:38 PM  Result Value Ref Range Status   MRSA by PCR NEGATIVE NEGATIVE Final    Comment:        The GeneXpert MRSA Assay (FDA approved for NASAL specimens only), is one component of a comprehensive  MRSA colonization surveillance program. It is not intended to diagnose MRSA infection nor to guide or monitor treatment for MRSA infections.    Coagulation Studies: No results for input(s): LABPROT, INR in the last 72 hours.  Urinalysis: No results for input(s): COLORURINE, LABSPEC, PHURINE, GLUCOSEU, HGBUR, BILIRUBINUR, KETONESUR, PROTEINUR, UROBILINOGEN, NITRITE, LEUKOCYTESUR in the last 168 hours.  Invalid input(s): APPERANCEUR  Lipid Panel:     Component Value Date/Time   CHOL 190 07/03/2014 1035   TRIG 241.0* 07/03/2014 1035   HDL 33.80* 07/03/2014 1035   CHOLHDL 6 07/03/2014 1035   VLDL 48.2* 07/03/2014 1035   LDLCALC 27 10/11/2013 0836    HgbA1C:  Lab Results  Component Value Date   HGBA1C 6.5 07/03/2014    Urine Drug Screen:  No results found for: LABOPIA, COCAINSCRNUR, LABBENZ, AMPHETMU, THCU, LABBARB  Alcohol Level: No results for input(s): ETH in the last 168 hours.   Imaging: Ct Angio Head W/cm &/or Wo Cm  08/03/2014   CLINICAL DATA:  LEFT neck pain, headache. History of hypertension, diabetes.  EXAM: CT ANGIOGRAPHY HEAD AND NECK  TECHNIQUE: Multidetector CT imaging of the head and neck was performed using the standard protocol during bolus administration of intravenous contrast. Multiplanar CT image reconstructions and MIPs were obtained  to evaluate the vascular anatomy. Carotid stenosis measurements (when applicable) are obtained utilizing NASCET criteria, using the distal internal carotid diameter as the denominator.  CONTRAST:  OMNIPAQUE IOHEXOL 350 MG/ML SOLN  COMPARISON:  CT of the head April 01, 2013  FINDINGS: CT HEAD  Moderate ventriculomegaly, advanced for age is a stable from prior imaging, likely on the basis of global parenchymal brain volume loss as there is overall enlargement of the cerebral sulci and cerebellar folia, cavum septum pellucidum. No intraparenchymal hemorrhage, mass effect nor midline shift. Patchy supratentorial white matter hypodensities are advanced for patient's age and though non-specific suggest sequelae of chronic small vessel ischemic disease, stable. No acute large vascular territory infarcts. No abnormal intracranial enhancement.  No abnormal extra-axial fluid collections. Basal cisterns are patent. Severe calcific atherosclerosis of the carotid siphons.  No skull fracture. The included ocular globes and orbital contents are non-suspicious, bilateral ocular lens implants. RIGHT apparent scleral banding. The mastoid aircells and included paranasal sinuses are well-aerated. Patient is edentulous.  CTA NECK  Normal appearance of the thoracic arch, 2 vessel arch is a normal variant. Mild calcific atherosclerosis. The origins of the innominate, left Common carotid artery and subclavian artery are widely patent.  Bilateral Common carotid arteries are widely patent, coursing in a straight line fashion. Normal appearance of the carotid bifurcations without hemodynamically significant stenosis by NASCET criteria. Eccentric 2-3 mm calcific atherosclerosis in intimal thickening at the carotid bulbs extending to the internal carotid artery origins. Normal appearance of the included internal carotid arteries.  Left vertebral artery is dominant. Mild diffuse luminal irregularity of the RIGHT vertebral artery without  discrete dissection flap. Mild calcific atherosclerosis of the origins bilaterally. No high-grade stenosis.  No no pseudoaneurysm.  No contrast extravasation.  Soft tissues are not acute. No acute osseous process though bone windows have not been submitted.  CTA HEAD  Anterior circulation: Normal appearance of the cervical internal carotid arteries, petrous Optiray calcific atherosclerosis of the carotid siphons with mid to high-grade stenosis bilaterally suspected. Cavernous and supra clinoid internal carotid arteries. Widely patent anterior communicating artery. Moderate luminal irregularity of the anterior and middle cerebral artery mid to distal segments without large vessel occlusion.  Posterior circulation: LEFT vertebral  artery is dominant. Mild luminal irregularity and eccentric calcific atherosclerosis the LEFT V4 segment, with luminal regularity extending to the vertebrobasilar junction. This may be accentuated by beam hardening artifact from skullbase. Basilar artery is widely patent, patent main branch vessels. Small RIGHT posterior communicating artery. Mild luminal irregularity of the LEFT greater than RIGHT posterior cerebral arteries.  No large vessel occlusion, contrast extravasation or aneurysm within the anterior nor posterior circulation.  IMPRESSION: CT HEAD:  No acute intracranial process.  Moderate parenchymal brain volume loss, advanced for age. Moderate white matter changes suggest chronic small vessel ischemic disease, advanced for age cyst similar to prior imaging.  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  Calcific atherosclerosis without hemodynamically significant stenosis.  CTA HEAD: Luminal irregularity of the LEFT V4 segment may reflect minimal intimal injury without dissection flap or flow limiting stenosis.  Moderate luminal irregularity of the cerebral  arteries most consistent with atherosclerosis.  Severe calcific atherosclerosis of bilateral carotid siphons with probable mid to high-grade stenosis bilaterally. RIGHT posterior communicating artery present.   Electronically Signed   By: Awilda Metro   On: 08/03/2014 00:53   Ct Angio Neck W/cm &/or Wo/cm  08/03/2014   CLINICAL DATA:  LEFT neck pain, headache. History of hypertension, diabetes.  EXAM: CT ANGIOGRAPHY HEAD AND NECK  TECHNIQUE: Multidetector CT imaging of the head and neck was performed using the standard protocol during bolus administration of intravenous contrast. Multiplanar CT image reconstructions and MIPs were obtained to evaluate the vascular anatomy. Carotid stenosis measurements (when applicable) are obtained utilizing NASCET criteria, using the distal internal carotid diameter as the denominator.  CONTRAST:  OMNIPAQUE IOHEXOL 350 MG/ML SOLN  COMPARISON:  CT of the head April 01, 2013  FINDINGS: CT HEAD  Moderate ventriculomegaly, advanced for age is a stable from prior imaging, likely on the basis of global parenchymal brain volume loss as there is overall enlargement of the cerebral sulci and cerebellar folia, cavum septum pellucidum. No intraparenchymal hemorrhage, mass effect nor midline shift. Patchy supratentorial white matter hypodensities are advanced for patient's age and though non-specific suggest sequelae of chronic small vessel ischemic disease, stable. No acute large vascular territory infarcts. No abnormal intracranial enhancement.  No abnormal extra-axial fluid collections. Basal cisterns are patent. Severe calcific atherosclerosis of the carotid siphons.  No skull fracture. The included ocular globes and orbital contents are non-suspicious, bilateral ocular lens implants. RIGHT apparent scleral banding. The mastoid aircells and included paranasal sinuses are well-aerated. Patient is edentulous.  CTA NECK  Normal appearance of the thoracic arch, 2 vessel arch is  a normal variant. Mild calcific atherosclerosis. The origins of the innominate, left Common carotid artery and subclavian artery are widely patent.  Bilateral Common carotid arteries are widely patent, coursing in a straight line fashion. Normal appearance of the carotid bifurcations without hemodynamically significant stenosis by NASCET criteria. Eccentric 2-3 mm calcific atherosclerosis in intimal thickening at the carotid bulbs extending to the internal carotid artery origins. Normal appearance of the included internal carotid arteries.  Left vertebral artery is dominant. Mild diffuse luminal irregularity of the RIGHT vertebral artery without discrete dissection flap. Mild calcific atherosclerosis of the origins bilaterally. No high-grade stenosis.  No no pseudoaneurysm.  No contrast extravasation.  Soft tissues are not acute. No acute osseous process though bone windows have not been submitted.  CTA HEAD  Anterior circulation: Normal appearance of the cervical internal carotid  arteries, petrous Optiray calcific atherosclerosis of the carotid siphons with mid to high-grade stenosis bilaterally suspected. Cavernous and supra clinoid internal carotid arteries. Widely patent anterior communicating artery. Moderate luminal irregularity of the anterior and middle cerebral artery mid to distal segments without large vessel occlusion.  Posterior circulation: LEFT vertebral artery is dominant. Mild luminal irregularity and eccentric calcific atherosclerosis the LEFT V4 segment, with luminal regularity extending to the vertebrobasilar junction. This may be accentuated by beam hardening artifact from skullbase. Basilar artery is widely patent, patent main branch vessels. Small RIGHT posterior communicating artery. Mild luminal irregularity of the LEFT greater than RIGHT posterior cerebral arteries.  No large vessel occlusion, contrast extravasation or aneurysm within the anterior nor posterior circulation.  IMPRESSION: CT  HEAD:  No acute intracranial process.  Moderate parenchymal brain volume loss, advanced for age. Moderate white matter changes suggest chronic small vessel ischemic disease, advanced for age cyst similar to prior imaging.  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  Calcific atherosclerosis without hemodynamically significant stenosis.  CTA HEAD: Luminal irregularity of the LEFT V4 segment may reflect minimal intimal injury without dissection flap or flow limiting stenosis.  Moderate luminal irregularity of the cerebral arteries most consistent with atherosclerosis.  Severe calcific atherosclerosis of bilateral carotid siphons with probable mid to high-grade stenosis bilaterally. RIGHT posterior communicating artery present.   Electronically Signed   By: Awilda Metro   On: 08/03/2014 00:53     Assessment/Plan:  58y/o gentleman with hx of DM, HTN presenting for evaluation of severe headache, neck pain and hypertension x 4 days. Exam is non-focal with tenderness to palpation over right lesser occipital nerve. Markedly elevated in the ED. 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   -Increase ASA to  daily -MRI C spine -Gabapentin  BID for symptomatic relief -Gradual lowering of blood pressure  Elspeth Cho, DO [facility] [phone]  If 7pm- 7am, please page neurology on call as listed in AMION. 08/03/2014, 2:32 AM

## 2014-08-03 NOTE — ED Notes (Signed)
Attempted report X1

## 2014-08-03 NOTE — H&P (Signed)
[facility] History and Physical  Alexander Hubbard ZOX:[id number] DOB: 1955/11/17 DOA: 08/02/2014  Referring physician: EDP PCP: Leo Grosser, MD   Chief Complaint: Neck pain, headache   HPI: Alexander Hubbard is a 59 y.o. male pmh of DM2, HTN.  Presents to ED with elevated BP and headache.  Symptoms onset spontaneously 4 days ago.  Pain in neck is bilateral, paraspinal.  Pain has persisted since onset and progressively worsened.  Pain is worse when any pressure to back of neck such as pillow.  OTC meds not effective.  No history of neck trauma.  Review of Systems: Systems reviewed.  As above, otherwise negative  Past Medical History  Diagnosis Date  . Diabetes mellitus   . Hypertension   . Obesity   . Sleep apnea   . Gouty arthritis    Past Surgical History  Procedure Laterality Date  . Eye surgery    . Knee surgery    . Gastric bypass     Social History:  reports that he has never smoked. He has never used smokeless tobacco. He reports that he does not drink alcohol or use illicit drugs.  Allergies  Allergen Reactions  . Penicillins Swelling and Rash  . Amoxicillin Swelling and Rash  . Niacin Itching and Other (See Comments)    Other reaction(s): Flushing (ALLERGY/intolerance)  . Penicillin G Rash    Family History  Problem Relation Age of Onset  . Hypertension Sister   . Diabetes Mellitus II Sister      Prior to Admission medications   Medication Sig Start Date End Date Taking? Authorizing Provider  ammonium lactate (LAC-HYDRIN) 12 % lotion Apply 1 application topically 2 (two) times daily as needed (legs). 08/30/13  Yes Delories Heinz, DPM  aspirin EC 81 MG tablet Take 81 mg by mouth daily.   Yes Historical Provider, MD  brinzolamide (AZOPT) 1 % ophthalmic suspension Place 1 drop into both eyes 2 (two) times daily.     Yes Historical Provider, MD  calcium carbonate (OS-CAL) 1250 (500 CA) MG chewable tablet Chew 1 tablet by mouth daily.   Yes Historical  Provider, MD  carvedilol (COREG) 3.125 MG tablet Take 3.125 mg by mouth 2 (two) times daily with a meal.   Yes Historical Provider, MD  colchicine 0.6 MG tablet TAKE 1 TABLET BY MOUTH EVERY DAY 04/05/14  Yes Donita Brooks, MD  CRESTOR 20 MG tablet TAKE 1 TABLET (20 MG TOTAL) BY MOUTH DAILY. 06/05/14  Yes Donita Brooks, MD  diclofenac sodium (VOLTAREN) 1 % GEL Apply 2 g topically 4 (four) times daily. 06/30/14  Yes Donita Brooks, MD  docusate sodium (COLACE) 100 MG capsule Take 100 mg by mouth 2 (two) times daily.   Yes Historical Provider, MD  ferrous sulfate 325 (65 FE) MG EC tablet Take 325 mg by mouth 3 (three) times daily with meals.   Yes Historical Provider, MD  gabapentin (NEURONTIN) 300 MG capsule Take 300 mg by mouth 2 (two) times daily.   Yes Historical Provider, MD  glucose blood (ACCU-CHEK SMARTVIEW) test strip 1 each by Other route See admin instructions. Check blood sugar 3 times daily.   Yes Historical Provider, MD  latanoprost (XALATAN) 0.005 % ophthalmic solution Place 1 drop into the left eye at bedtime.    Yes Historical Provider, MD  Linaclotide Karlene Einstein) 145 MCG CAPS capsule Take 1 capsule (145 mcg total) by mouth daily. 04/07/14  Yes Donita Brooks, MD  losartan (COZAAR) 25 MG tablet  Take 1 tablet (25 mg total) by mouth daily. 07/06/14  Yes Reather Littler, MD  metFORMIN (GLUCOPHAGE) 500 MG tablet TAKE 1 TABLET BY MOUTH 2 TIMES A DAY WITH A MEAL 07/24/14  Yes Reather Littler, MD  methocarbamol (ROBAXIN-750) 750 MG tablet Take 1 tablet (750 mg total) by mouth every 6 (six) hours as needed for muscle spasms. 08/02/14  Yes Olivia Mackie, MD  Multiple Vitamin (MULTIVITAMIN) capsule Take 1 capsule by mouth 2 (two) times daily.   Yes Historical Provider, MD  NEOMYCIN-POLYMYXIN-HYDROCORTISONE (CORTISPORIN) 1 % SOLN otic solution PLACE 4 DROPS INTO BOTH EARS 4 (FOUR) TIMES DAILY. 06/05/14  Yes Donita Brooks, MD  OVER THE COUNTER MEDICATION Take 1 each by mouth 3 (three) times daily. "Calcium  Citrate Chewy Bite 500-500"   Yes Historical Provider, MD  OVER THE COUNTER MEDICATION Take 1 tablet by mouth at bedtime. "Strawberry Flavored Chewable Iron with Vitamin C 18 mg / 30 mg"   Yes Historical Provider, MD  oxyCODONE-acetaminophen (PERCOCET/ROXICET) 5-325 MG per tablet Take 1-2 tablets by mouth every 6 (six) hours as needed for severe pain. 08/02/14  Yes Olivia Mackie, MD  pantoprazole (PROTONIX) 40 MG tablet TAKE 1 TABLET BY MOUTH TWICE A DAY 03/06/14  Yes Donita Brooks, MD  polyethylene glycol [facility] / GLYCOLAX) packet Take 17 g by mouth at bedtime.   Yes Historical Provider, MD  prednisoLONE acetate (PRED FORTE) 1 % ophthalmic suspension Place 1 drop into the right eye at bedtime.    Yes Historical Provider, MD  timolol (TIMOPTIC) 0.5 % ophthalmic solution Place 1 drop into both eyes 2 (two) times daily.   Yes Historical Provider, MD  ULORIC 40 MG tablet TAKE 1 TABLET BY MOUTH EVERY DAY 04/03/14  Yes Donita Brooks, MD  UNABLE TO FIND Dispense one pair of Diabetic Shoes. ICD E11.49, G62.9, B35.1, R60.9 06/12/14  Yes Donita Brooks, MD  vitamin B-12 (CYANOCOBALAMIN) 1000 MCG tablet Take 1,000 mcg by mouth. (Chewable tablet)   Yes Historical Provider, MD  diazepam (VALIUM) 2 MG tablet Take 1 tablet (2 mg total) by mouth every 12 (twelve) hours as needed for muscle spasms. 08/03/14   Shaune Pollack, MD  sildenafil (VIAGRA) 100 MG tablet Take 1 tablet (100 mg total) by mouth daily as needed for erectile dysfunction. 04/14/14   Donato Schultz, MD   Physical Exam: Filed Vitals:   08/03/14 0200  BP: 192/61  Pulse: 71  Temp:   Resp:     BP 192/61 mmHg  Pulse 71  Temp(Src) 98.5 F (36.9 C)  Resp 18  SpO2 100%  General Appearance:    Alert, oriented, no distress, appears stated age  Head:    Normocephalic, atraumatic  Eyes:    PERRL, EOMI, sclera non-icteric        Nose:   Nares without drainage or epistaxis. Mucosa, turbinates normal  Throat:   Moist mucous membranes. Oropharynx  without erythema or exudate.  Neck:   Supple. No carotid bruits.  No thyromegaly.  No lymphadenopathy.   Back:     No CVA tenderness, no spinal tenderness  Lungs:     Clear to auscultation bilaterally, without wheezes, rhonchi or rales  Chest wall:    No tenderness to palpitation  Heart:    Regular rate and rhythm without murmurs, gallops, rubs  Abdomen:     Soft, non-tender, nondistended, normal bowel sounds, no organomegaly  Genitalia:    deferred  Rectal:    deferred  Extremities:  No clubbing, cyanosis or edema.  Pulses:   2+ and symmetric all extremities  Skin:   Skin color, texture, turgor normal, no rashes or lesions  Lymph nodes:   Cervical, supraclavicular, and axillary nodes normal  Neurologic:   CNII-XII intact. Normal strength, sensation and reflexes      throughout    Labs on Admission:  Basic Metabolic Panel:  Recent Labs Lab 08/02/14 1942 08/02/14 2003  NA 140 137  K 4.4 4.2  CL 102 103  CO2  --  28  GLUCOSE 153* 150*  BUN 46* 46*  CREATININE 1.60* 1.57*  CALCIUM  --  9.9   Liver Function Tests: No results for input(s): AST, ALT, ALKPHOS, BILITOT, PROT, ALBUMIN in the last 168 hours. No results for input(s): LIPASE, AMYLASE in the last 168 hours. No results for input(s): AMMONIA in the last 168 hours. CBC:  Recent Labs Lab 08/02/14 1942 08/03/14 0137  WBC  --  8.5  HGB 14.3 12.7*  HCT 42.0 37.6*  MCV  --  80.3  PLT  --  181   Cardiac Enzymes: No results for input(s): CKTOTAL, CKMB, CKMBINDEX, TROPONINI in the last 168 hours.  BNP (last 3 results) No results for input(s): PROBNP in the last 8760 hours. CBG: No results for input(s): GLUCAP in the last 168 hours.  Radiological Exams on Admission: Ct Angio Head W/cm &/or Wo Cm  08/03/2014   CLINICAL DATA:  LEFT neck pain, headache. History of hypertension, diabetes.  EXAM: CT ANGIOGRAPHY HEAD AND NECK  TECHNIQUE: Multidetector CT imaging of the head and neck was performed using the standard  protocol during bolus administration of intravenous contrast. Multiplanar CT image reconstructions and MIPs were obtained to evaluate the vascular anatomy. Carotid stenosis measurements (when applicable) are obtained utilizing NASCET criteria, using the distal internal carotid diameter as the denominator.  CONTRAST:  100mL OMNIPAQUE IOHEXOL 350 MG/ML SOLN  COMPARISON:  CT of the head April 01, 2013  FINDINGS: CT HEAD  Moderate ventriculomegaly, advanced for age is a stable from prior imaging, likely on the basis of global parenchymal brain volume loss as there is overall enlargement of the cerebral sulci and cerebellar folia, cavum septum pellucidum. No intraparenchymal hemorrhage, mass effect nor midline shift. Patchy supratentorial white matter hypodensities are advanced for patient's age and though non-specific suggest sequelae of chronic small vessel ischemic disease, stable. No acute large vascular territory infarcts. No abnormal intracranial enhancement.  No abnormal extra-axial fluid collections. Basal cisterns are patent. Severe calcific atherosclerosis of the carotid siphons.  No skull fracture. The included ocular globes and orbital contents are non-suspicious, bilateral ocular lens implants. RIGHT apparent scleral banding. The mastoid aircells and included paranasal sinuses are well-aerated. Patient is edentulous.  CTA NECK  Normal appearance of the thoracic arch, 2 vessel arch is a normal variant. Mild calcific atherosclerosis. The origins of the innominate, left Common carotid artery and subclavian artery are widely patent.  Bilateral Common carotid arteries are widely patent, coursing in a straight line fashion. Normal appearance of the carotid bifurcations without hemodynamically significant stenosis by NASCET criteria. Eccentric 2-3 mm calcific atherosclerosis in intimal thickening at the carotid bulbs extending to the internal carotid artery origins. Normal appearance of the included internal  carotid arteries.  Left vertebral artery is dominant. Mild diffuse luminal irregularity of the RIGHT vertebral artery without discrete dissection flap. Mild calcific atherosclerosis of the origins bilaterally. No high-grade stenosis.  No no pseudoaneurysm.  No contrast extravasation.  Soft tissues are not acute.  No acute osseous process though bone windows have not been submitted.  CTA HEAD  Anterior circulation: Normal appearance of the cervical internal carotid arteries, petrous Optiray calcific atherosclerosis of the carotid siphons with mid to high-grade stenosis bilaterally suspected. Cavernous and supra clinoid internal carotid arteries. Widely patent anterior communicating artery. Moderate luminal irregularity of the anterior and middle cerebral artery mid to distal segments without large vessel occlusion.  Posterior circulation: LEFT vertebral artery is dominant. Mild luminal irregularity and eccentric calcific atherosclerosis the LEFT V4 segment, with luminal regularity extending to the vertebrobasilar junction. This may be accentuated by beam hardening artifact from skullbase. Basilar artery is widely patent, patent main branch vessels. Small RIGHT posterior communicating artery. Mild luminal irregularity of the LEFT greater than RIGHT posterior cerebral arteries.  No large vessel occlusion, contrast extravasation or aneurysm within the anterior nor posterior circulation.  IMPRESSION: CT HEAD:  No acute intracranial process.  Moderate parenchymal brain volume loss, advanced for age. Moderate white matter changes suggest chronic small vessel ischemic disease, advanced for age cyst similar to prior imaging.  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  Calcific atherosclerosis without hemodynamically significant stenosis.  CTA HEAD: Luminal irregularity of the LEFT V4 segment may reflect  minimal intimal injury without dissection flap or flow limiting stenosis.  Moderate luminal irregularity of the cerebral arteries most consistent with atherosclerosis.  Severe calcific atherosclerosis of bilateral carotid siphons with probable mid to high-grade stenosis bilaterally. RIGHT posterior communicating artery present.   Electronically Signed   By: Awilda Metro   On: 08/03/2014 00:53   Ct Angio Neck W/cm &/or Wo/cm  08/03/2014   CLINICAL DATA:  LEFT neck pain, headache. History of hypertension, diabetes.  EXAM: CT ANGIOGRAPHY HEAD AND NECK  TECHNIQUE: Multidetector CT imaging of the head and neck was performed using the standard protocol during bolus administration of intravenous contrast. Multiplanar CT image reconstructions and MIPs were obtained to evaluate the vascular anatomy. Carotid stenosis measurements (when applicable) are obtained utilizing NASCET criteria, using the distal internal carotid diameter as the denominator.  CONTRAST:  OMNIPAQUE IOHEXOL 350 MG/ML SOLN  COMPARISON:  CT of the head April 01, 2013  FINDINGS: CT HEAD  Moderate ventriculomegaly, advanced for age is a stable from prior imaging, likely on the basis of global parenchymal brain volume loss as there is overall enlargement of the cerebral sulci and cerebellar folia, cavum septum pellucidum. No intraparenchymal hemorrhage, mass effect nor midline shift. Patchy supratentorial white matter hypodensities are advanced for patient's age and though non-specific suggest sequelae of chronic small vessel ischemic disease, stable. No acute large vascular territory infarcts. No abnormal intracranial enhancement.  No abnormal extra-axial fluid collections. Basal cisterns are patent. Severe calcific atherosclerosis of the carotid siphons.  No skull fracture. The included ocular globes and orbital contents are non-suspicious, bilateral ocular lens implants. RIGHT apparent scleral banding. The mastoid aircells and included  paranasal sinuses are well-aerated. Patient is edentulous.  CTA NECK  Normal appearance of the thoracic arch, 2 vessel arch is a normal variant. Mild calcific atherosclerosis. The origins of the innominate, left Common carotid artery and subclavian artery are widely patent.  Bilateral Common carotid arteries are widely patent, coursing in a straight line fashion. Normal appearance of the carotid bifurcations without hemodynamically significant stenosis by NASCET criteria. Eccentric 2-3 mm calcific atherosclerosis in intimal thickening at the carotid bulbs extending to the internal carotid artery origins.  Normal appearance of the included internal carotid arteries.  Left vertebral artery is dominant. Mild diffuse luminal irregularity of the RIGHT vertebral artery without discrete dissection flap. Mild calcific atherosclerosis of the origins bilaterally. No high-grade stenosis.  No no pseudoaneurysm.  No contrast extravasation.  Soft tissues are not acute. No acute osseous process though bone windows have not been submitted.  CTA HEAD  Anterior circulation: Normal appearance of the cervical internal carotid arteries, petrous Optiray calcific atherosclerosis of the carotid siphons with mid to high-grade stenosis bilaterally suspected. Cavernous and supra clinoid internal carotid arteries. Widely patent anterior communicating artery. Moderate luminal irregularity of the anterior and middle cerebral artery mid to distal segments without large vessel occlusion.  Posterior circulation: LEFT vertebral artery is dominant. Mild luminal irregularity and eccentric calcific atherosclerosis the LEFT V4 segment, with luminal regularity extending to the vertebrobasilar junction. This may be accentuated by beam hardening artifact from skullbase. Basilar artery is widely patent, patent main branch vessels. Small RIGHT posterior communicating artery. Mild luminal irregularity of the LEFT greater than RIGHT posterior cerebral arteries.   No large vessel occlusion, contrast extravasation or aneurysm within the anterior nor posterior circulation.  IMPRESSION: CT HEAD:  No acute intracranial process.  Moderate parenchymal brain volume loss, advanced for age. Moderate white matter changes suggest chronic small vessel ischemic disease, advanced for age cyst similar to prior imaging.  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  Calcific atherosclerosis without hemodynamically significant stenosis.  CTA HEAD: Luminal irregularity of the LEFT V4 segment may reflect minimal intimal injury without dissection flap or flow limiting stenosis.  Moderate luminal irregularity of the cerebral arteries most consistent with atherosclerosis.  Severe calcific atherosclerosis of bilateral carotid siphons with probable mid to high-grade stenosis bilaterally. RIGHT posterior communicating artery present.   Electronically Signed   By: Awilda Metro   On: 08/03/2014 00:53    EKG: Independently reviewed.  Assessment/Plan Principal Problem:   Headache Active Problems:   HTN (hypertension)   HLD (hyperlipidemia)   DM2 (diabetes mellitus, type 2)   Chronic kidney disease, stage III (moderate)   1. 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 MRI neck to further evaluate 2. Increasing ASA from 81 to 325 at recommendation of neurology 3. 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 Pain control with percocet and control of the patients HTN 2. HTN - 1. Continue home BP meds 2. Add labetalol PRN 3. DM2 - continue home metformin, CBG checks AC/HS 4. CKD stage 3 - chronic and at baseline 5. HLD - continue statin   Code Status: Full Code  Family Communication: Family at bedside Disposition Plan: Admit to obs   Time spent: 70  min  Grettel Rames M. [facility] Pager [phone]  If 7AM-7PM, please contact the day team taking care of the patient [email or web address] Password [email or web address] 08/03/2014, 3:10 AM

## 2014-08-03 NOTE — ED Provider Notes (Signed)
Neuro dr Hosie Poissonsumner advised admission for BP control and HA control Will need MR cspine Pt agreeable with plan   Alexander Gaskinsonald W Dontrey Snellgrove, MD 08/03/14 [phone]

## 2014-08-03 NOTE — Progress Notes (Signed)
Subjective: Improvement in neck pain.   Exam: Filed Vitals:   08/03/14 1326  BP: 157/77  Pulse: 66  Temp:   Resp: 18   Gen: In bed, NAD MS: awake, alert.  ZO:XWRUECN:PERRL, EOMI Motor: 5/5 throughout Sensory:intact to LT  MRI Cspine - joint effusion at C5-6   Impression: 59 yo M with neck pain that I suspect is musculoskelatal in nature. I suspect that the CTA findings are chronic and are not contributing to the current picture, but if it were acute would treat with anti-platelets and analgesia which we will be doing anyway.    Recommendations: 1) continue asa 81mg  daily 2) analgesia for neck pain 3) No further recommendations, please call with any further questions or concerns.   Ritta SlotMcNeill Ellionna Buckbee, MD [facility] [phone]  If 7pm- 7am, please page neurology on call as listed in AMION.

## 2014-08-03 NOTE — Progress Notes (Signed)
UR completed 

## 2014-08-03 NOTE — Progress Notes (Signed)
Admission note:  Arrival Method:  Mental Orientation:A&OX4 Telemetry: Box Assessment: See doc flowsheets Skin: Dry; intact; no skin issues. Assessed by myself and charge nurse Clement Sayresathie Wicker. IV: L forearm Pain: 6/10 neck pain; Pt refuses medication at this time. Tubes: N/A Safety Measures: Bed in lowest position, call light with in reach Fall Prevention Safety Plan:  Admission Screening: To be completed 6700 Orientation: Patient has been oriented to the unit, staff and to the room.  MD notified on pt's arrival.

## 2014-08-03 NOTE — ED Notes (Signed)
CBG value 119.

## 2014-08-03 NOTE — Progress Notes (Signed)
Patient ID: Alexander Hubbard  male  ZOX:[id number]    DOB: 26-Oct-1955    DOA: 08/02/2014  PCP: Leo Grosser, MD  Brief HPI: Alexander Hubbard is a 59 y.o. male with history of  DM2, HTN presented to ED with elevated BP and headache. Symptoms onset spontaneously 4 days ago. Pain in neck described as bilateral, paraspinal. Pain had persisted since onset and progressively worsened. Pain is worse when any pressure to back of neck such as pillow and OTC meds were not effective. No history of neck trauma. He was seen in ED on 3/2 morning for neck pain and bilateral leg pain. Per EDP, exam was consistent with muscle pain. He was given cervical paraspinal injection and recommended oral robaxin and percocet.    Assessment/Plan: Principal Problem:   Headache and neck pain: - likely musculoskeletal. 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  - MRI c-spine showed degenerative change of the cervical spine without acute osseous process, trace right C5-6 facet effusion likely reactive, mild canal stenosis at C5-6, mild- moderate right C5-6 and C6-7 neural foraminal narrowing. - Per neuro, continue ASA 81mg , pain control, neurontin, robaxin - IV fentanyl as needed for severe pain, may benefit from outpatient neurosurgery evaluation     Active Problems:   HTN (hypertension) - BP improving    HLD (hyperlipidemia) - continue stattin    DM2 (diabetes mellitus, type 2) - place on sliding scale insulin    Chronic kidney disease, stage III (moderate) - Cr trending up, hold colchicine, losartan tonight, gentle hydration   DVT Prophylaxis: heparin SQ  Code Status: FC  Family Communication: Discussed with patient's daughter, Dr [place] Callas in detail   Disposition:  Consultants:  neurology  Procedures:  CT head and neck  MRI c spine  Antibiotics:  None    Subjective: Pain seen and examined earlier this am in ED, was still waiting for the room. C/o  neck pain 7/10, headache, no nausea, vomiting    Objective: Weight change:   Intake/Output Summary (Last 24 hours) at 08/03/14 1730 Last data filed at 08/02/14 2341  Gross per 24 hour  Intake      0 ml  Output    700 ml  Net   -700 ml   Blood pressure 127/53, pulse 59, temperature 97.9 F (36.6 C), temperature source Oral, resp. rate 18, SpO2 98 %.  Physical Exam: General: Alert and awake, oriented x3, not in any acute distress. CVS: S1-S2 clear, no murmur rubs or gallops Chest: clear to auscultation bilaterally, no wheezing, rales or rhonchi Abdomen: soft nontender, nondistended, normal bowel sounds  Extremities: no cyanosis, clubbing or edema noted bilaterally Neuro: Cranial nerves II-XII intact, no focal neurological deficits  Lab Results: Basic Metabolic Panel:  Recent Labs Lab 08/02/14 1942 08/02/14 2003  NA 140 137  K 4.4 4.2  CL 102 103  CO2  --  28  GLUCOSE 153* 150*  BUN 46* 46*  CREATININE 1.60* 1.57*  CALCIUM  --  9.9   Liver Function Tests: No results for input(s): AST, ALT, ALKPHOS, BILITOT, PROT, ALBUMIN in the last 168 hours. No results for input(s): LIPASE, AMYLASE in the last 168 hours. No results for input(s): AMMONIA in the last 168 hours. CBC:  Recent Labs Lab 08/02/14 1942 08/03/14 0137  WBC  --  8.5  HGB 14.3 12.7*  HCT 42.0 37.6*  MCV  --  80.3  PLT  --  181   Cardiac Enzymes: No results  for input(s): CKTOTAL, CKMB, CKMBINDEX, TROPONINI in the last 168 hours. BNP: Invalid input(s): POCBNP CBG:  Recent Labs Lab 08/03/14 0618 08/03/14 0926 08/03/14 1625  GLUCAP 134* 119* 110*     Micro Results: No results found for this or any previous visit (from the past 240 hour(s)).  Studies/Results: Ct Angio Head W/cm &/or Wo Cm  08/03/2014   CLINICAL DATA:  LEFT neck pain, headache. History of hypertension, diabetes.  EXAM: CT ANGIOGRAPHY HEAD AND NECK  TECHNIQUE: Multidetector CT imaging of the head and neck was performed using the  standard protocol during bolus administration of intravenous contrast. Multiplanar CT image reconstructions and MIPs were obtained to evaluate the vascular anatomy. Carotid stenosis measurements (when applicable) are obtained utilizing NASCET criteria, using the distal internal carotid diameter as the denominator.  CONTRAST:  OMNIPAQUE IOHEXOL 350 MG/ML SOLN  COMPARISON:  CT of the head April 01, 2013  FINDINGS: CT HEAD  Moderate ventriculomegaly, advanced for age is a stable from prior imaging, likely on the basis of global parenchymal brain volume loss as there is overall enlargement of the cerebral sulci and cerebellar folia, cavum septum pellucidum. No intraparenchymal hemorrhage, mass effect nor midline shift. Patchy supratentorial white matter hypodensities are advanced for patient's age and though non-specific suggest sequelae of chronic small vessel ischemic disease, stable. No acute large vascular territory infarcts. No abnormal intracranial enhancement.  No abnormal extra-axial fluid collections. Basal cisterns are patent. Severe calcific atherosclerosis of the carotid siphons.  No skull fracture. The included ocular globes and orbital contents are non-suspicious, bilateral ocular lens implants. RIGHT apparent scleral banding. The mastoid aircells and included paranasal sinuses are well-aerated. Patient is edentulous.  CTA NECK  Normal appearance of the thoracic arch, 2 vessel arch is a normal variant. Mild calcific atherosclerosis. The origins of the innominate, left Common carotid artery and subclavian artery are widely patent.  Bilateral Common carotid arteries are widely patent, coursing in a straight line fashion. Normal appearance of the carotid bifurcations without hemodynamically significant stenosis by NASCET criteria. Eccentric 2-3 mm calcific atherosclerosis in intimal thickening at the carotid bulbs extending to the internal carotid artery origins. Normal appearance of the included  internal carotid arteries.  Left vertebral artery is dominant. Mild diffuse luminal irregularity of the RIGHT vertebral artery without discrete dissection flap. Mild calcific atherosclerosis of the origins bilaterally. No high-grade stenosis.  No no pseudoaneurysm.  No contrast extravasation.  Soft tissues are not acute. No acute osseous process though bone windows have not been submitted.  CTA HEAD  Anterior circulation: Normal appearance of the cervical internal carotid arteries, petrous Optiray calcific atherosclerosis of the carotid siphons with mid to high-grade stenosis bilaterally suspected. Cavernous and supra clinoid internal carotid arteries. Widely patent anterior communicating artery. Moderate luminal irregularity of the anterior and middle cerebral artery mid to distal segments without large vessel occlusion.  Posterior circulation: LEFT vertebral artery is dominant. Mild luminal irregularity and eccentric calcific atherosclerosis the LEFT V4 segment, with luminal regularity extending to the vertebrobasilar junction. This may be accentuated by beam hardening artifact from skullbase. Basilar artery is widely patent, patent main branch vessels. Small RIGHT posterior communicating artery. Mild luminal irregularity of the LEFT greater than RIGHT posterior cerebral arteries.  No large vessel occlusion, contrast extravasation or aneurysm within the anterior nor posterior circulation.  IMPRESSION: CT HEAD:  No acute intracranial process.  Moderate parenchymal brain volume loss, advanced for age. Moderate white matter changes suggest chronic small vessel ischemic disease, advanced for age cyst  similar to prior imaging.  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  Calcific atherosclerosis without hemodynamically significant stenosis.  CTA HEAD: Luminal irregularity of the LEFT V4 segment may  reflect minimal intimal injury without dissection flap or flow limiting stenosis.  Moderate luminal irregularity of the cerebral arteries most consistent with atherosclerosis.  Severe calcific atherosclerosis of bilateral carotid siphons with probable mid to high-grade stenosis bilaterally. RIGHT posterior communicating artery present.   Electronically Signed   By: Awilda Metro   On: 08/03/2014 00:53   Ct Angio Neck W/cm &/or Wo/cm  08/03/2014   CLINICAL DATA:  LEFT neck pain, headache. History of hypertension, diabetes.  EXAM: CT ANGIOGRAPHY HEAD AND NECK  TECHNIQUE: Multidetector CT imaging of the head and neck was performed using the standard protocol during bolus administration of intravenous contrast. Multiplanar CT image reconstructions and MIPs were obtained to evaluate the vascular anatomy. Carotid stenosis measurements (when applicable) are obtained utilizing NASCET criteria, using the distal internal carotid diameter as the denominator.  CONTRAST:  OMNIPAQUE IOHEXOL 350 MG/ML SOLN  COMPARISON:  CT of the head April 01, 2013  FINDINGS: CT HEAD  Moderate ventriculomegaly, advanced for age is a stable from prior imaging, likely on the basis of global parenchymal brain volume loss as there is overall enlargement of the cerebral sulci and cerebellar folia, cavum septum pellucidum. No intraparenchymal hemorrhage, mass effect nor midline shift. Patchy supratentorial white matter hypodensities are advanced for patient's age and though non-specific suggest sequelae of chronic small vessel ischemic disease, stable. No acute large vascular territory infarcts. No abnormal intracranial enhancement.  No abnormal extra-axial fluid collections. Basal cisterns are patent. Severe calcific atherosclerosis of the carotid siphons.  No skull fracture. The included ocular globes and orbital contents are non-suspicious, bilateral ocular lens implants. RIGHT apparent scleral banding. The mastoid aircells and  included paranasal sinuses are well-aerated. Patient is edentulous.  CTA NECK  Normal appearance of the thoracic arch, 2 vessel arch is a normal variant. Mild calcific atherosclerosis. The origins of the innominate, left Common carotid artery and subclavian artery are widely patent.  Bilateral Common carotid arteries are widely patent, coursing in a straight line fashion. Normal appearance of the carotid bifurcations without hemodynamically significant stenosis by NASCET criteria. Eccentric 2-3 mm calcific atherosclerosis in intimal thickening at the carotid bulbs extending to the internal carotid artery origins. Normal appearance of the included internal carotid arteries.  Left vertebral artery is dominant. Mild diffuse luminal irregularity of the RIGHT vertebral artery without discrete dissection flap. Mild calcific atherosclerosis of the origins bilaterally. No high-grade stenosis.  No no pseudoaneurysm.  No contrast extravasation.  Soft tissues are not acute. No acute osseous process though bone windows have not been submitted.  CTA HEAD  Anterior circulation: Normal appearance of the cervical internal carotid arteries, petrous Optiray calcific atherosclerosis of the carotid siphons with mid to high-grade stenosis bilaterally suspected. Cavernous and supra clinoid internal carotid arteries. Widely patent anterior communicating artery. Moderate luminal irregularity of the anterior and middle cerebral artery mid to distal segments without large vessel occlusion.  Posterior circulation: LEFT vertebral artery is dominant. Mild luminal irregularity and eccentric calcific atherosclerosis the LEFT V4 segment, with luminal regularity extending to the vertebrobasilar junction. This may be accentuated by beam hardening artifact from skullbase. Basilar artery is widely patent, patent main branch vessels. Small RIGHT posterior communicating artery. Mild luminal irregularity of the LEFT greater than  RIGHT posterior cerebral  arteries.  No large vessel occlusion, contrast extravasation or aneurysm within the anterior nor posterior circulation.  IMPRESSION: CT HEAD:  No acute intracranial process.  Moderate parenchymal brain volume loss, advanced for age. Moderate white matter changes suggest chronic small vessel ischemic disease, advanced for age cyst similar to prior imaging.  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  Calcific atherosclerosis without hemodynamically significant stenosis.  CTA HEAD: Luminal irregularity of the LEFT V4 segment may reflect minimal intimal injury without dissection flap or flow limiting stenosis.  Moderate luminal irregularity of the cerebral arteries most consistent with atherosclerosis.  Severe calcific atherosclerosis of bilateral carotid siphons with probable mid to high-grade stenosis bilaterally. RIGHT posterior communicating artery present.   Electronically Signed   By: Awilda Metroourtnay  Bloomer   On: 08/03/2014 00:53   Mr Cervical Spine Wo Contrast  08/03/2014   CLINICAL DATA:  Acute onset bilateral neck and paraspinal pain, worsening. No trauma. History of diabetes, hypertension and headache.  EXAM: MRI CERVICAL SPINE WITHOUT CONTRAST  TECHNIQUE: Multiplanar, multisequence MR imaging of the cervical spine was performed. No intravenous contrast was administered.  COMPARISON:  CT angiogram of the head and neck August 02, 2014  FINDINGS: Cervical vertebral bodies and posterior elements appear intact and lying, straightened cervical lordosis. Intervertebral discs demonstrate normal morphology, decreased T2 signal within all disc consistent with mild desiccation. Mild chronic discogenic endplate changes C5-6 and C6-7. No STIR signal abnormality to suggest acute osseous process.  Cervical spinal cord appears normal morphology and signal characteristics from the cervical medullary junction to  the level of T1-2, the most caudal well visualized level. Craniocervical junction intact. Prevertebral and paraspinal soft tissues are nonsuspicious.  Level by level evaluation:  C2-3: No disc bulge. No disc bulge. Mild facet arthropathy without canal stenosis or neural foraminal narrowing.  C3-4: 2 mm broad-based RIGHT central disc protrusion, uncovertebral hypertrophy and mild facet arthropathy. No canal stenosis or neural foraminal narrowing.  C4-5: 1-2 mm central disc protrusion, uncovertebral hypertrophy. No canal stenosis. No neural foraminal narrowing.  C5-6: 2 mm broad-based disc bulge, uncovertebral hypertrophy and mild facet arthropathy. Mild canal stenosis. Mild to moderate RIGHT neural foraminal narrowing. Trace RIGHT facet effusion is likely reactive.  C6-7: 2 mm broad-based disc bulge asymmetric to the RIGHT, uncovertebral hypertrophy, no canal stenosis. Mild to moderate RIGHT neural foraminal narrowing.  C7-T1: No disc bulge, canal stenosis nor neural foraminal narrowing.  IMPRESSION: Degenerative change of the cervical spine without acute osseous process. Trace RIGHT C5-6 facet effusion is likely reactive.  Mild canal stenosis at C5-6. Mild to moderate RIGHT C5-6 and C6-7 neural foraminal narrowing.   Electronically Signed   By: Awilda Metroourtnay  Bloomer   On: 08/03/2014 05:37    Medications: Scheduled Meds: . [START ON 08/04/2014] aspirin EC  81 mg Oral Daily  . brinzolamide  1 drop Both Eyes BID  . calcium carbonate  1,250 mg Oral Daily  . carvedilol  3.125 mg Oral BID WC  . colchicine  0.6 mg Oral Daily  . diclofenac sodium  2 g Topical QID  . docusate sodium  100 mg Oral BID  . febuxostat  40 mg Oral Daily  . gabapentin  300 mg Oral BID  . heparin  5,000 Units Subcutaneous 3 times per day  . latanoprost  1 drop Left Eye QHS  . Linaclotide  145 mcg Oral Daily  . losartan  25 mg Oral Daily  . multivitamin with minerals  1 tablet Oral BID  . pantoprazole  40 mg Oral BID  . polyethylene  glycol  17 g Oral QHS  . prednisoLONE acetate  1 drop Right Eye QHS  . rosuvastatin  20 mg Oral Daily  . timolol  1 drop Both Eyes BID   Time spent 25 mins     Truett Mcfarlan M.D. [facility] 08/03/2014, 5:30 PM Pager: [phone]  If 7PM-7AM, please contact night-coverage [email or web address] Password [email or web address]

## 2014-08-03 NOTE — ED Notes (Signed)
Patient transported to MRI 

## 2014-08-04 ENCOUNTER — Telehealth: Payer: Self-pay | Admitting: Family Medicine

## 2014-08-04 ENCOUNTER — Ambulatory Visit: Payer: Medicaid Other | Admitting: Family Medicine

## 2014-08-04 DIAGNOSIS — N183 Chronic kidney disease, stage 3 (moderate): Secondary | ICD-10-CM | POA: Diagnosis not present

## 2014-08-04 DIAGNOSIS — G44209 Tension-type headache, unspecified, not intractable: Secondary | ICD-10-CM | POA: Diagnosis not present

## 2014-08-04 DIAGNOSIS — E785 Hyperlipidemia, unspecified: Secondary | ICD-10-CM | POA: Diagnosis not present

## 2014-08-04 DIAGNOSIS — I10 Essential (primary) hypertension: Secondary | ICD-10-CM | POA: Diagnosis not present

## 2014-08-04 LAB — BASIC METABOLIC PANEL
ANION GAP: 13 (ref 5–15)
BUN: 31 mg/dL — AB (ref 6–23)
CALCIUM: 9.2 mg/dL (ref 8.4–10.5)
CO2: 19 mmol/L (ref 19–32)
CREATININE: 1.52 mg/dL — AB (ref 0.50–1.35)
Chloride: 108 mmol/L (ref 96–112)
GFR calc non Af Amer: 49 mL/min — ABNORMAL LOW (ref >90)
GFR, EST AFRICAN AMERICAN: 57 mL/min — AB (ref >90)
Glucose, Bld: 126 mg/dL — ABNORMAL HIGH (ref 70–99)
Potassium: 4.3 mmol/L (ref 3.5–5.1)
Sodium: 140 mmol/L (ref 135–145)

## 2014-08-04 LAB — GLUCOSE, CAPILLARY
GLUCOSE-CAPILLARY: 122 mg/dL — AB (ref 70–99)
Glucose-Capillary: 113 mg/dL — ABNORMAL HIGH (ref 70–99)

## 2014-08-04 MED ORDER — LOSARTAN POTASSIUM 25 MG PO TABS
25.0000 mg | ORAL_TABLET | Freq: Every day | ORAL | Status: DC
  Filled 2014-08-04: qty 1

## 2014-08-04 MED ORDER — AMLODIPINE BESYLATE 5 MG PO TABS
5.0000 mg | ORAL_TABLET | Freq: Every day | ORAL | Status: DC
  Administered 2014-08-04: 5 mg via ORAL
  Filled 2014-08-04: qty 1

## 2014-08-04 MED ORDER — DIAZEPAM 2 MG PO TABS: 2.0000 mg | ORAL_TABLET | Freq: Two times a day (BID) | ORAL | Status: DC | PRN

## 2014-08-04 MED ORDER — METFORMIN HCL 500 MG PO TABS: 500.0000 mg | ORAL_TABLET | Freq: Two times a day (BID) | ORAL | Status: DC

## 2014-08-04 MED ORDER — AMLODIPINE BESYLATE 5 MG PO TABS: 5.0000 mg | ORAL_TABLET | Freq: Every day | ORAL | Status: DC

## 2014-08-04 MED ORDER — OXYCODONE-ACETAMINOPHEN 5-325 MG PO TABS: 1.0000 | ORAL_TABLET | Freq: Four times a day (QID) | ORAL | Status: DC | PRN

## 2014-08-04 MED ORDER — COLCHICINE 0.6 MG PO TABS: 0.6000 mg | ORAL_TABLET | Freq: Every day | ORAL | Status: DC

## 2014-08-04 NOTE — Discharge Summary (Signed)
Physician Discharge Summary  Patient ID: Alexander Hubbard MRN: [id number] DOB/AGE: September 26, 1955 59 y.o.  Admit date: 08/02/2014 Discharge date: 08/04/2014  Primary Care Physician:  Leo Grosser, MD  Discharge Diagnoses:    . 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 Acute on Chronic kidney disease, stage III (moderate) . HTN (hypertension) . HLD (hyperlipidemia)  Consults:  Neurology, Dr. Amada Jupiter Neurosurgery via phone consultation, Dr. Newell Coral   Recommendations for Outpatient Follow-up:  Please note that patient was recently started on losartan, creatinine has been steadily trending up, 1.6 at the time of admission, 1.5 at the time of discharge. Losartan, colchicine briefly placed on hold.  Patient was recommended to resume metformin after 48 hours however if the creatinine function continues to worsen, he is not a right candidate for metformin   TESTS THAT NEED FOLLOW-UP BMET   DIET:Card modified diet    Allergies:   Allergies  Allergen Reactions  . Penicillins Swelling and Rash  . Amoxicillin Swelling and Rash  . Niacin Itching and Other (See Comments)    Other reaction(s): Flushing (ALLERGY/intolerance)  . Penicillin G Rash     Discharge Medications:   Medication List    STOP taking these medications        losartan 25 MG tablet  Commonly known as:  COZAAR      TAKE these medications        ACCU-CHEK SMARTVIEW test strip  Generic drug:  glucose blood  1 each by Other route See admin instructions. Check blood sugar 3 times daily.     amLODipine 5 MG tablet  Commonly known as:  NORVASC  Take 1 tablet (5 mg total) by mouth daily.     ammonium lactate 12 % lotion  Commonly known as:  LAC-HYDRIN  Apply 1 application topically 2 (two) times daily as needed (legs).     aspirin EC 81 MG tablet  Take 81 mg by mouth daily.     brinzolamide 1 % ophthalmic  suspension  Commonly known as:  AZOPT  Place 1 drop into both eyes 2 (two) times daily.     calcium carbonate 1250 (500 CA) MG chewable tablet  Commonly known as:  OS-CAL  Chew 1 tablet by mouth daily.     carvedilol 3.125 MG tablet  Commonly known as:  COREG  Take 3.125 mg by mouth 2 (two) times daily with a meal.     colchicine 0.6 MG tablet  Take 1 tablet (0.6 mg total) by mouth daily. Hold until follow-up with PCP     CRESTOR 20 MG tablet  Generic drug:  rosuvastatin  TAKE 1 TABLET (20 MG TOTAL) BY MOUTH DAILY.     diazepam 2 MG tablet  Commonly known as:  VALIUM  Take 1 tablet (2 mg total) by mouth every 12 (twelve) hours as needed for muscle spasms.     diclofenac sodium 1 % Gel  Commonly known as:  VOLTAREN  Apply 2 g topically 4 (four) times daily.     docusate sodium 100 MG capsule  Commonly known as:  COLACE  Take 100 mg by mouth 2 (two) times daily.     ferrous sulfate 325 (65 FE) MG EC tablet  Take 325 mg by mouth 3 (three) times daily with meals.     gabapentin 300 MG capsule  Commonly known as:  NEURONTIN  Take 300 mg by mouth 2 (two) times daily.  latanoprost 0.005 % ophthalmic solution  Commonly known as:  XALATAN  Place 1 drop into the left eye at bedtime.     Linaclotide 145 MCG Caps capsule  Commonly known as:  LINZESS  Take 1 capsule (145 mcg total) by mouth daily.     metFORMIN 500 MG tablet  Commonly known as:  GLUCOPHAGE  Take 1 tablet (500 mg total) by mouth 2 (two) times daily with a meal.  Start taking on:  08/06/2014     methocarbamol 750 MG tablet  Commonly known as:  ROBAXIN-750  Take 1 tablet (750 mg total) by mouth every 6 (six) hours as needed for muscle spasms.     multivitamin capsule  Take 1 capsule by mouth 2 (two) times daily.     NEOMYCIN-POLYMYXIN-HYDROCORTISONE 1 % Soln otic solution  Commonly known as:  CORTISPORIN  PLACE 4 DROPS INTO BOTH EARS 4 (FOUR) TIMES DAILY.     OVER THE COUNTER MEDICATION  Take 1 each by  mouth 3 (three) times daily. "Calcium Citrate Chewy Bite 500-500"     OVER THE COUNTER MEDICATION  Take 1 tablet by mouth at bedtime. "Strawberry Flavored Chewable Iron with Vitamin C 18 mg / 30 mg"     oxyCODONE-acetaminophen 5-325 MG per tablet  Commonly known as:  PERCOCET/ROXICET  Take 1-2 tablets by mouth every 6 (six) hours as needed for severe pain.     pantoprazole 40 MG tablet  Commonly known as:  PROTONIX  TAKE 1 TABLET BY MOUTH TWICE A DAY     polyethylene glycol packet  Commonly known as:  MIRALAX / GLYCOLAX  Take 17 g by mouth at bedtime.     prednisoLONE acetate 1 % ophthalmic suspension  Commonly known as:  PRED FORTE  Place 1 drop into the right eye at bedtime.     sildenafil 100 MG tablet  Commonly known as:  VIAGRA  Take 1 tablet (100 mg total) by mouth daily as needed for erectile dysfunction.     timolol 0.5 % ophthalmic solution  Commonly known as:  TIMOPTIC  Place 1 drop into both eyes 2 (two) times daily.     ULORIC 40 MG tablet  Generic drug:  febuxostat  TAKE 1 TABLET BY MOUTH EVERY DAY     UNABLE TO FIND  - Dispense one pair of Diabetic Shoes.  - ICD E11.49, G62.9, B35.1, R60.9     vitamin B-12 1000 MCG tablet  Commonly known as:  CYANOCOBALAMIN  Take 1,000 mcg by mouth. (Chewable tablet)         Brief H and P: For complete details please refer to admission H and P, but in brief Alexander Hubbard is a 59 y.o. male with history of DM2, HTN presented to ED with elevated BP and headache. Symptoms onset spontaneously 4 days ago. Pain in neck described as bilateral, paraspinal. Pain had persisted since onset and progressively worsened. Pain is worse when any pressure to back of neck such as pillow and OTC meds were not effective. No history of neck trauma. He was seen in ED on 3/2 morning for neck pain and bilateral leg pain. Per EDP, exam was consistent with muscle pain. He was given cervical paraspinal injection and recommended oral robaxin  and percocet.   Hospital Course:  Headache and neck pain: - likely musculoskeletal. 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  - MRI c-spine showed degenerative change of the cervical spine without acute osseous process, trace  right C5-6 facet effusion likely reactive, mild canal stenosis at C5-6, mild- moderate right C5-6 and C6-7 neural foraminal narrowing. - Patient was seen by neurology, recommended  continue ASA 81mg , pain control, neurontin, robaxin - I discussed in detail with Dr. Newell Coral, neurosurgery on call who reviewed the patient's MRI C-spine did not feel patient has significant nerve root encroachment to need surgery, however will benefit from pain management with one of the partners in the office, will need referral from PCP.     Accelerated HTN (hypertension) - Patient's BP was significantly elevated at 190/85 at admission he was restarted on Coreg. Given his creatinine function had worsened to 1.6, losartan has been held. Patient was placed on Norvasc. I strongly encouraged him to discuss about losartan with his PCP, endocrinologist and his nephrologist at [facility] due to worsening of his creatinine function.   HLD (hyperlipidemia) - continue stattin   DM2 (diabetes mellitus, type 2) - Patient was placed on sliding scale insulin inpatient, to hold metformin for 48 hours. He should have repeat BMET checked at the follow-up appointment with his PCP next week.   Chronic kidney disease, stage III (moderate) -Creatinine improved from 1.6-1.5. Losartan and colchicine, metformin was held  Day of Discharge BP 134/54 mmHg  Pulse 57  Temp(Src) 98.2 F (36.8 C) (Oral)  Resp 18  Ht 5\' 10"  (1.778 m)  Wt 124.4 kg (274 lb 4 oz)  BMI 39.35 kg/m2  SpO2 100%  Physical Exam: General: Alert and awake oriented x3 not in any acute distress. HEENT: anicteric sclera, pupils reactive to light and accommodation CVS: S1-S2 clear no murmur rubs  or gallops Chest: clear to auscultation bilaterally, no wheezing rales or rhonchi Abdomen: soft nontender, nondistended, normal bowel sounds Extremities: no cyanosis, clubbing or edema noted bilaterally Neuro: Cranial nerves II-XII intact, no focal neurological deficits   The results of significant diagnostics from this hospitalization (including imaging, microbiology, ancillary and laboratory) are listed below for reference.    LAB RESULTS: Basic Metabolic Panel:  Recent Labs Lab 08/02/14 2003 08/04/14 0559  NA 137 140  K 4.2 4.3  CL 103 108  CO2 28 19  GLUCOSE 150* 126*  BUN 46* 31*  CREATININE 1.57* 1.52*  CALCIUM 9.9 9.2   Liver Function Tests: No results for input(s): AST, ALT, ALKPHOS, BILITOT, PROT, ALBUMIN in the last 168 hours. No results for input(s): LIPASE, AMYLASE in the last 168 hours. No results for input(s): AMMONIA in the last 168 hours. CBC:  Recent Labs Lab 08/02/14 1942 08/03/14 0137  WBC  --  8.5  HGB 14.3 12.7*  HCT 42.0 37.6*  MCV  --  80.3  PLT  --  181   Cardiac Enzymes: No results for input(s): CKTOTAL, CKMB, CKMBINDEX, TROPONINI in the last 168 hours. BNP: Invalid input(s): POCBNP CBG:  Recent Labs Lab 08/04/14 0748 08/04/14 1207  GLUCAP 113* 122*    Significant Diagnostic Studies:  Ct Angio Head W/cm &/or Wo Cm  08/03/2014   CLINICAL DATA:  LEFT neck pain, headache. History of hypertension, diabetes.  EXAM: CT ANGIOGRAPHY HEAD AND NECK  TECHNIQUE: Multidetector CT imaging of the head and neck was performed using the standard protocol during bolus administration of intravenous contrast. Multiplanar CT image reconstructions and MIPs were obtained to evaluate the vascular anatomy. Carotid stenosis measurements (when applicable) are obtained utilizing NASCET criteria, using the distal internal carotid diameter as the denominator.  CONTRAST:  OMNIPAQUE IOHEXOL 350 MG/ML SOLN  COMPARISON:  CT of the  head April 01, 2013  FINDINGS: CT  HEAD  Moderate ventriculomegaly, advanced for age is a stable from prior imaging, likely on the basis of global parenchymal brain volume loss as there is overall enlargement of the cerebral sulci and cerebellar folia, cavum septum pellucidum. No intraparenchymal hemorrhage, mass effect nor midline shift. Patchy supratentorial white matter hypodensities are advanced for patient's age and though non-specific suggest sequelae of chronic small vessel ischemic disease, stable. No acute large vascular territory infarcts. No abnormal intracranial enhancement.  No abnormal extra-axial fluid collections. Basal cisterns are patent. Severe calcific atherosclerosis of the carotid siphons.  No skull fracture. The included ocular globes and orbital contents are non-suspicious, bilateral ocular lens implants. RIGHT apparent scleral banding. The mastoid aircells and included paranasal sinuses are well-aerated. Patient is edentulous.  CTA NECK  Normal appearance of the thoracic arch, 2 vessel arch is a normal variant. Mild calcific atherosclerosis. The origins of the innominate, left Common carotid artery and subclavian artery are widely patent.  Bilateral Common carotid arteries are widely patent, coursing in a straight line fashion. Normal appearance of the carotid bifurcations without hemodynamically significant stenosis by NASCET criteria. Eccentric 2-3 mm calcific atherosclerosis in intimal thickening at the carotid bulbs extending to the internal carotid artery origins. Normal appearance of the included internal carotid arteries.  Left vertebral artery is dominant. Mild diffuse luminal irregularity of the RIGHT vertebral artery without discrete dissection flap. Mild calcific atherosclerosis of the origins bilaterally. No high-grade stenosis.  No no pseudoaneurysm.  No contrast extravasation.  Soft tissues are not acute. No acute osseous process though bone windows have not been submitted.  CTA HEAD  Anterior circulation:  Normal appearance of the cervical internal carotid arteries, petrous Optiray calcific atherosclerosis of the carotid siphons with mid to high-grade stenosis bilaterally suspected. Cavernous and supra clinoid internal carotid arteries. Widely patent anterior communicating artery. Moderate luminal irregularity of the anterior and middle cerebral artery mid to distal segments without large vessel occlusion.  Posterior circulation: LEFT vertebral artery is dominant. Mild luminal irregularity and eccentric calcific atherosclerosis the LEFT V4 segment, with luminal regularity extending to the vertebrobasilar junction. This may be accentuated by beam hardening artifact from skullbase. Basilar artery is widely patent, patent main branch vessels. Small RIGHT posterior communicating artery. Mild luminal irregularity of the LEFT greater than RIGHT posterior cerebral arteries.  No large vessel occlusion, contrast extravasation or aneurysm within the anterior nor posterior circulation.  IMPRESSION: CT HEAD:  No acute intracranial process.  Moderate parenchymal brain volume loss, advanced for age. Moderate white matter changes suggest chronic small vessel ischemic disease, advanced for age cyst similar to prior imaging.  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  Calcific atherosclerosis without hemodynamically significant stenosis.  CTA HEAD: Luminal irregularity of the LEFT V4 segment may reflect minimal intimal injury without dissection flap or flow limiting stenosis.  Moderate luminal irregularity of the cerebral arteries most consistent with atherosclerosis.  Severe calcific atherosclerosis of bilateral carotid siphons with probable mid to high-grade stenosis bilaterally. RIGHT posterior communicating artery present.   Electronically Signed   By: Awilda Metro   On: 08/03/2014 00:53   Ct Angio Neck  W/cm &/or Wo/cm  08/03/2014   CLINICAL DATA:  LEFT neck pain, headache. History of hypertension, diabetes.  EXAM: CT ANGIOGRAPHY HEAD AND NECK  TECHNIQUE: Multidetector CT imaging of the head and neck was performed using the standard protocol during  bolus administration of intravenous contrast. Multiplanar CT image reconstructions and MIPs were obtained to evaluate the vascular anatomy. Carotid stenosis measurements (when applicable) are obtained utilizing NASCET criteria, using the distal internal carotid diameter as the denominator.  CONTRAST:  OMNIPAQUE IOHEXOL 350 MG/ML SOLN  COMPARISON:  CT of the head April 01, 2013  FINDINGS: CT HEAD  Moderate ventriculomegaly, advanced for age is a stable from prior imaging, likely on the basis of global parenchymal brain volume loss as there is overall enlargement of the cerebral sulci and cerebellar folia, cavum septum pellucidum. No intraparenchymal hemorrhage, mass effect nor midline shift. Patchy supratentorial white matter hypodensities are advanced for patient's age and though non-specific suggest sequelae of chronic small vessel ischemic disease, stable. No acute large vascular territory infarcts. No abnormal intracranial enhancement.  No abnormal extra-axial fluid collections. Basal cisterns are patent. Severe calcific atherosclerosis of the carotid siphons.  No skull fracture. The included ocular globes and orbital contents are non-suspicious, bilateral ocular lens implants. RIGHT apparent scleral banding. The mastoid aircells and included paranasal sinuses are well-aerated. Patient is edentulous.  CTA NECK  Normal appearance of the thoracic arch, 2 vessel arch is a normal variant. Mild calcific atherosclerosis. The origins of the innominate, left Common carotid artery and subclavian artery are widely patent.  Bilateral Common carotid arteries are widely patent, coursing in a straight line fashion. Normal appearance of the carotid bifurcations without  hemodynamically significant stenosis by NASCET criteria. Eccentric 2-3 mm calcific atherosclerosis in intimal thickening at the carotid bulbs extending to the internal carotid artery origins. Normal appearance of the included internal carotid arteries.  Left vertebral artery is dominant. Mild diffuse luminal irregularity of the RIGHT vertebral artery without discrete dissection flap. Mild calcific atherosclerosis of the origins bilaterally. No high-grade stenosis.  No no pseudoaneurysm.  No contrast extravasation.  Soft tissues are not acute. No acute osseous process though bone windows have not been submitted.  CTA HEAD  Anterior circulation: Normal appearance of the cervical internal carotid arteries, petrous Optiray calcific atherosclerosis of the carotid siphons with mid to high-grade stenosis bilaterally suspected. Cavernous and supra clinoid internal carotid arteries. Widely patent anterior communicating artery. Moderate luminal irregularity of the anterior and middle cerebral artery mid to distal segments without large vessel occlusion.  Posterior circulation: LEFT vertebral artery is dominant. Mild luminal irregularity and eccentric calcific atherosclerosis the LEFT V4 segment, with luminal regularity extending to the vertebrobasilar junction. This may be accentuated by beam hardening artifact from skullbase. Basilar artery is widely patent, patent main branch vessels. Small RIGHT posterior communicating artery. Mild luminal irregularity of the LEFT greater than RIGHT posterior cerebral arteries.  No large vessel occlusion, contrast extravasation or aneurysm within the anterior nor posterior circulation.  IMPRESSION: CT HEAD:  No acute intracranial process.  Moderate parenchymal brain volume loss, advanced for age. Moderate white matter changes suggest chronic small vessel ischemic disease, advanced for age cyst similar to prior imaging.  CTA NECK: Diffuse luminal irregularity of the RIGHT vertebral artery  suggests intimal injury/ chronic dissection without dissection flap or flow limiting stenosis.  Calcific atherosclerosis without hemodynamically significant stenosis.  CTA HEAD: Luminal irregularity of the LEFT V4 segment may reflect minimal intimal injury without dissection flap or flow limiting stenosis.  Moderate luminal irregularity of the cerebral arteries most consistent with atherosclerosis.  Severe calcific atherosclerosis of bilateral carotid siphons with probable mid to high-grade stenosis bilaterally. RIGHT posterior communicating artery present.   Electronically Signed   By: Awilda Metro  On: 08/03/2014 00:53   Mr Cervical Spine Wo Contrast  08/03/2014   CLINICAL DATA:  Acute onset bilateral neck and paraspinal pain, worsening. No trauma. History of diabetes, hypertension and headache.  EXAM: MRI CERVICAL SPINE WITHOUT CONTRAST  TECHNIQUE: Multiplanar, multisequence MR imaging of the cervical spine was performed. No intravenous contrast was administered.  COMPARISON:  CT angiogram of the head and neck August 02, 2014  FINDINGS: Cervical vertebral bodies and posterior elements appear intact and lying, straightened cervical lordosis. Intervertebral discs demonstrate normal morphology, decreased T2 signal within all disc consistent with mild desiccation. Mild chronic discogenic endplate changes C5-6 and C6-7. No STIR signal abnormality to suggest acute osseous process.  Cervical spinal cord appears normal morphology and signal characteristics from the cervical medullary junction to the level of T1-2, the most caudal well visualized level. Craniocervical junction intact. Prevertebral and paraspinal soft tissues are nonsuspicious.  Level by level evaluation:  C2-3: No disc bulge. No disc bulge. Mild facet arthropathy without canal stenosis or neural foraminal narrowing.  C3-4: 2 mm broad-based RIGHT central disc protrusion, uncovertebral hypertrophy and mild facet arthropathy. No canal stenosis or  neural foraminal narrowing.  C4-5: 1-2 mm central disc protrusion, uncovertebral hypertrophy. No canal stenosis. No neural foraminal narrowing.  C5-6: 2 mm broad-based disc bulge, uncovertebral hypertrophy and mild facet arthropathy. Mild canal stenosis. Mild to moderate RIGHT neural foraminal narrowing. Trace RIGHT facet effusion is likely reactive.  C6-7: 2 mm broad-based disc bulge asymmetric to the RIGHT, uncovertebral hypertrophy, no canal stenosis. Mild to moderate RIGHT neural foraminal narrowing.  C7-T1: No disc bulge, canal stenosis nor neural foraminal narrowing.  IMPRESSION: Degenerative change of the cervical spine without acute osseous process. Trace RIGHT C5-6 facet effusion is likely reactive.  Mild canal stenosis at C5-6. Mild to moderate RIGHT C5-6 and C6-7 neural foraminal narrowing.   Electronically Signed   By: Awilda Metroourtnay  Bloomer   On: 08/03/2014 05:37    2D ECHO:   Disposition and Follow-up: Discharge Instructions    Diet Carb Modified    Complete by:  As directed      Discharge instructions    Complete by:  As directed   Please hold colchicine and losartan until follow-up with PCP, Dr. Tanya NonesPickard.  Please request your primary care physician, Dr. Haynes DagePickett for referral to the pain management at neurosurgery office.  Please discuss with Dr. Lucianne MussKumar regarding metformin if your kidney function has not improved, you will need to be on a different diabetes medication.     Increase activity slowly    Complete by:  As directed             DISPOSITION: home    DISCHARGE FOLLOW-UP Follow-up Information    [facility] On 08/10/2014.   Specialty:  Family Medicine   Why:  for hospital follow-up, obtain labs   Contact information:   [address] [place] [address] [phone]       Follow up with Gwynne EdingerHARKINS, PAUL C, MD. Schedule an appointment as soon as possible for a visit in 2 weeks.   Specialty:  Anesthesiology   Why:  for hospital  follow-up   Contact information:   [address] [place] [address] [phone]        Time spent on Discharge: 35 mins  Signed:   Alle Difabio M.D. [facility] 08/04/2014, 1:02 PM Pager: [phone]

## 2014-08-04 NOTE — Progress Notes (Signed)
Patient Discharge:  Disposition: Pt discharged home with wife  Education: Pt and wife educated on medications and changes in medications, follow up appointment and all discharge instructions. Pt and wife verbalized understanding.  IV: Removed  Telemetry: Removed CCMD notified  Follow-up appointments: Reviewed with pt. Pt has made follow up appointments  Prescriptions: Scripts given to pt  Transportation: Pt transported home by wife  Belongings:All belongings taken with pt

## 2014-08-04 NOTE — Telephone Encounter (Signed)
[phone]  PT was recently discharged from the hospital and he is needing a referral to Dr Ollen BowlHarkins on  [address] within next 2 weeks.

## 2014-08-08 NOTE — Telephone Encounter (Signed)
Called pt and he stated he was told by ED that he needed to follow up with a Neurosurgeon but was not sure why he needed to be seen, pt has appt with Pickard on Thursday for hospital follow up informed pt to let provider know and he can place a referral. I was not sure what the patient was talking about.

## 2014-08-09 ENCOUNTER — Telehealth: Payer: Self-pay | Admitting: *Deleted

## 2014-08-09 NOTE — Telephone Encounter (Signed)
Pt called stating that was just discharged from ED last Thursday and was given some medication and now he has severe constipation, states he has taken OTC laxatives and doesn't seem to help, pt want to know if you could cal something in for him to help relief him. Pt does have appt on Thurs March 10 with Pickard  Pt stated the ON CALL provider last night and had advised him to go to ED or call office in the am (just called) [facility]. Informed pt provider was not here today but can send message to another provider, pt is in desperate need for relief. Please advise!  [facility]

## 2014-08-09 NOTE — Telephone Encounter (Signed)
His chart states he has Linzess and Miralax already, is he taking these If so, increase miralax to twice a day, continue linzess If this does not work get Dulcolax suppository

## 2014-08-10 ENCOUNTER — Ambulatory Visit (INDEPENDENT_AMBULATORY_CARE_PROVIDER_SITE_OTHER): Payer: Medicaid Other | Admitting: Family Medicine

## 2014-08-10 ENCOUNTER — Encounter: Payer: Self-pay | Admitting: Family Medicine

## 2014-08-10 VITALS — BP 110/60 | HR 68 | Temp 98.0°F | Resp 20 | Ht 68.0 in | Wt 276.0 lb

## 2014-08-10 DIAGNOSIS — I10 Essential (primary) hypertension: Secondary | ICD-10-CM

## 2014-08-10 DIAGNOSIS — I7774 Dissection of vertebral artery: Secondary | ICD-10-CM

## 2014-08-10 DIAGNOSIS — N183 Chronic kidney disease, stage 3 unspecified: Secondary | ICD-10-CM

## 2014-08-10 DIAGNOSIS — Z09 Encounter for follow-up examination after completed treatment for conditions other than malignant neoplasm: Secondary | ICD-10-CM

## 2014-08-10 DIAGNOSIS — M542 Cervicalgia: Secondary | ICD-10-CM | POA: Diagnosis not present

## 2014-08-10 MED ORDER — METHOCARBAMOL 750 MG PO TABS: 750.0000 mg | ORAL_TABLET | Freq: Four times a day (QID) | ORAL | Status: DC

## 2014-08-10 NOTE — Telephone Encounter (Signed)
Pt has appt today with Tanya Nonesickard will address at that time.

## 2014-08-10 NOTE — Progress Notes (Signed)
Subjective:    Patient ID: Alexander Hubbard, male    DOB: 26-Aug-1955, 59 y.o.   MRN: [id number]  HPI  patient recently went to the hospital with severe right-sided neck pain. I reviewed the hospital records. He was diagnosed with muscle spasms and was treated with trigger point injections and muscle relaxers. The patient went home and awoke with severe right-sided neck pain and significantly elevated blood pressure greater than 200/100. He went to the hospital again at this time a CT angiogram of the neck revealed a chronic right-sided vertebral artery dissection. He also had mild multilevel bulging disc in the cervical spine with no significant neural impingement. Patient was found to have elevated blood pressures and also worsening of his chronic kidney disease with a creatinine of 1.6. At that time losartan was held. His metformin was held. He was treated with pain medication and muscle relaxers. Both neurology and neurosurgery will consult. Neurology recommended increasing his aspirin to 325 mg a day for  Primary prevention of stroke. Neurosurgery recommended pain control as there was no surgical intervention necessary for his neck pain. Patient is here today for follow-up. He is currently on carvedilol and amlodipine for his blood pressure. His blood pressure is excellent at 110/60. His home blood pressures have been ranging 130s over 60s. He denies any chest pain or shortness of breath. To see his kidney doctor tomorrow to recheck his creatinine. He continues to hold his losartan as well as his metformin. I believe given his chronic kidney disease he would benefit from avoiding metformin and I have recommended that he call his endocrinologist to discuss further options. He continues to have 3 out of 10 pain in the right side of his neck. Symptomatically sounds more consistent with a muscle spasm possibly due to his degenerative disc disease and bulging disc. Patient received significant pain relief from  Robaxin. Past Medical History  Diagnosis Date  . Diabetes mellitus   . Hypertension   . Obesity   . Sleep apnea   . Gouty arthritis    Past Surgical History  Procedure Laterality Date  . Eye surgery    . Knee surgery    . Gastric bypass     Current Outpatient Prescriptions on File Prior to Visit  Medication Sig Dispense Refill  . amLODipine (NORVASC) 5 MG tablet Take 1 tablet (5 mg total) by mouth daily. 30 tablet 3  . ammonium lactate (LAC-HYDRIN) 12 % lotion Apply 1 application topically 2 (two) times daily as needed (legs). 400 g 2  . aspirin EC 81 MG tablet Take 81 mg by mouth daily.    . brinzolamide (AZOPT) 1 % ophthalmic suspension Place 1 drop into both eyes 2 (two) times daily.      . calcium carbonate (OS-CAL) 1250 (500 CA) MG chewable tablet Chew 1 tablet by mouth daily.    . carvedilol (COREG) 3.125 MG tablet Take 3.125 mg by mouth 2 (two) times daily with a meal.    . colchicine 0.6 MG tablet Take 1 tablet (0.6 mg total) by mouth daily. Hold until follow-up with PCP 30 tablet 5  . CRESTOR 20 MG tablet TAKE 1 TABLET (20 MG TOTAL) BY MOUTH DAILY. 30 tablet 3  . diazepam (VALIUM) 2 MG tablet Take 1 tablet (2 mg total) by mouth every 12 (twelve) hours as needed for muscle spasms. 30 tablet 0  . diclofenac sodium (VOLTAREN) 1 % GEL Apply 2 g topically 4 (four) times daily. 100 g 1  .  docusate sodium (COLACE) 100 MG capsule Take 100 mg by mouth 2 (two) times daily.    . ferrous sulfate 325 (65 FE) MG EC tablet Take 325 mg by mouth 3 (three) times daily with meals.    . gabapentin (NEURONTIN) 300 MG capsule Take 300 mg by mouth 2 (two) times daily.    Marland Kitchen. glucose blood (ACCU-CHEK SMARTVIEW) test strip 1 each by Other route See admin instructions. Check blood sugar 3 times daily.    Marland Kitchen. latanoprost (XALATAN) 0.005 % ophthalmic solution Place 1 drop into the left eye at bedtime.     . Linaclotide (LINZESS) 145 MCG CAPS capsule Take 1 capsule (145 mcg total) by mouth daily. 30 capsule  11  . metFORMIN (GLUCOPHAGE) 500 MG tablet Take 1 tablet (500 mg total) by mouth 2 (two) times daily with a meal. 60 tablet 3  . methocarbamol (ROBAXIN-750) 750 MG tablet Take 1 tablet (750 mg total) by mouth every 6 (six) hours as needed for muscle spasms. 40 tablet 0  . Multiple Vitamin (MULTIVITAMIN) capsule Take 1 capsule by mouth 2 (two) times daily.    . NEOMYCIN-POLYMYXIN-HYDROCORTISONE (CORTISPORIN) 1 % SOLN otic solution PLACE 4 DROPS INTO BOTH EARS 4 (FOUR) TIMES DAILY. 10 mL 0  . OVER THE COUNTER MEDICATION Take 1 each by mouth 3 (three) times daily. "Calcium Citrate Chewy Bite 500-500"    . OVER THE COUNTER MEDICATION Take 1 tablet by mouth at bedtime. "Strawberry Flavored Chewable Iron with Vitamin C 18 mg / 30 mg"    . oxyCODONE-acetaminophen (PERCOCET/ROXICET) 5-325 MG per tablet Take 1-2 tablets by mouth every 6 (six) hours as needed for severe pain. 30 tablet 0  . pantoprazole (PROTONIX) 40 MG tablet TAKE 1 TABLET BY MOUTH TWICE A DAY 60 tablet 11  . polyethylene glycol (MIRALAX / GLYCOLAX) packet Take 17 g by mouth at bedtime.    . prednisoLONE acetate (PRED FORTE) 1 % ophthalmic suspension Place 1 drop into the right eye at bedtime.     . sildenafil (VIAGRA) 100 MG tablet Take 1 tablet (100 mg total) by mouth daily as needed for erectile dysfunction. 6 tablet 12  . timolol (TIMOPTIC) 0.5 % ophthalmic solution Place 1 drop into both eyes 2 (two) times daily.    Marland Kitchen. ULORIC 40 MG tablet TAKE 1 TABLET BY MOUTH EVERY DAY 30 tablet 5  . UNABLE TO FIND Dispense one pair of Diabetic Shoes. ICD E11.49, G62.9, B35.1, R60.9 1 each 0  . vitamin B-12 (CYANOCOBALAMIN) 1000 MCG tablet Take 1,000 mcg by mouth. (Chewable tablet)     No current facility-administered medications on file prior to visit.   Allergies  Allergen Reactions  . Penicillins Swelling and Rash  . Amoxicillin Swelling and Rash  . Niacin Itching and Other (See Comments)    Other reaction(s): Flushing (ALLERGY/intolerance)    . Penicillin G Rash   History   Social History  . Marital Status: Married    Spouse Name: N/A  . Number of Children: N/A  . Years of Education: N/A   Occupational History  . Not on file.   Social History Main Topics  . Smoking status: Never Smoker   . Smokeless tobacco: Never Used  . Alcohol Use: No  . Drug Use: No  . Sexual Activity: Not on file   Other Topics Concern  . Not on file   Social History Narrative      Review of Systems  All other systems reviewed and are negative.  Objective:   Physical Exam  Constitutional: He appears well-developed and well-nourished.  Cardiovascular: Normal rate, regular rhythm and normal heart sounds.   No murmur heard. Pulmonary/Chest: Effort normal and breath sounds normal. No respiratory distress. He has no wheezes. He has no rales.  Abdominal: Soft. Bowel sounds are normal. He exhibits no distension. There is no tenderness.  Musculoskeletal:       Cervical back: He exhibits decreased range of motion, tenderness and pain.  Vitals reviewed.         Assessment & Plan:  Neck pain - Plan: methocarbamol (ROBAXIN-750) 750 MG tablet  Hospital discharge follow-up  CKD (chronic kidney disease) stage 3, GFR 30-59 ml/min  Vertebral artery dissection  Benign essential HTN   Patient's neck pain is consistent with muscle spasms secondary to his degenerative disc disease in his neck as well as bulging disc. I recommended using Robaxin 750 mg every 8 hours as needed for pain. Given his chronic kidney disease or recommended he discontinue metformin prominently. He is scheduled to follow-up with his nephrologist tomorrow to recheck his creatinine. His blood pressures well controlled today. I recommended the patient take his blood pressure 2-3 times every day for the next week and then repot the values to me. I believe some elevation in his blood pressure was likely due to anxiety and pain. Since he is home from the hospital and  comfortable his blood pressure has been much better. Regarding his vertebral artery dissection,  I have recommended he take aspirin 325 mg by mouth daily. We will need to monitor for any signs of gastritis or ulcer.   I also recommended the patient call his endocrinologist to determine a replacement for his metformin

## 2014-08-18 ENCOUNTER — Telehealth: Payer: Self-pay | Admitting: *Deleted

## 2014-08-18 NOTE — Telephone Encounter (Signed)
Received a fax from [facility] of appointment notification pt has appointment scheduled on 09/20/14 at 9am with Dr. Ollen BowlHarkins, pt is aware of appt .

## 2014-08-24 ENCOUNTER — Ambulatory Visit (INDEPENDENT_AMBULATORY_CARE_PROVIDER_SITE_OTHER): Payer: Medicaid Other | Admitting: Podiatrist

## 2014-08-24 ENCOUNTER — Encounter: Payer: Self-pay | Admitting: Podiatrist

## 2014-08-24 DIAGNOSIS — M79676 Pain in unspecified toe(s): Secondary | ICD-10-CM | POA: Diagnosis not present

## 2014-08-24 DIAGNOSIS — M79609 Pain in unspecified limb: Principal | ICD-10-CM

## 2014-08-24 DIAGNOSIS — B351 Tinea unguium: Secondary | ICD-10-CM

## 2014-08-24 DIAGNOSIS — G629 Polyneuropathy, unspecified: Secondary | ICD-10-CM

## 2014-09-02 NOTE — Progress Notes (Signed)
Subjective: Patient presents today with his wife for foot and nail care. The patient relatesthe no new lesions or  Ulcers.  He recently underwent weight loss surgery and has lost over 100 pounds so far. Patient also has painful thickened toenails of which he would like debrided at today's visit as well.  Objective: Neurovascular status is unchanged with neuropathy present and faintly palpable pedal pulses bilateral. The patient has absent hair growth and capillary refill time which is decreased bilateral. Patient's toenails are elongated, thickened and dystrophic.  Assessment: symptomatic mycotic nails , diabetes, neuropathy, angiopathy,   Plan: toenails debrided today without complication. Recommended diabetic shoes and wrote a rx for them so he can get them at a medical supply store he has gotten shoes from in the past.  He will be seen back in 3 months or as needed.

## 2014-09-07 ENCOUNTER — Telehealth: Payer: Self-pay | Admitting: *Deleted

## 2014-09-07 NOTE — Telephone Encounter (Signed)
Spoke with patient and he will call his insurance company to see what medications are covered.

## 2014-09-07 NOTE — Telephone Encounter (Signed)
He needs to find out from his insurance what the alternative medications are

## 2014-09-07 NOTE — Telephone Encounter (Signed)
This does not appear to be my prescription.  Would like to see if he can come in for his labs before his visit

## 2014-09-07 NOTE — Telephone Encounter (Signed)
There is a phone note on 07/06/14 where he was to start taking losartan for proteinuria and blood pressure. Please advise.

## 2014-09-07 NOTE — Telephone Encounter (Signed)
Patient's insurance will no longer pay for losartan.  Would you like to change the prescription?  Also he has an appointment Wednesday for follow up.  Would you like for him to come in for labs first?

## 2014-09-08 ENCOUNTER — Other Ambulatory Visit: Payer: Self-pay | Admitting: *Deleted

## 2014-09-08 ENCOUNTER — Telehealth: Payer: Self-pay | Admitting: Endocrinology

## 2014-09-08 NOTE — Telephone Encounter (Signed)
Opened encounter in error  

## 2014-09-08 NOTE — Telephone Encounter (Signed)
Patient called stating that he would need Dr. Lucianne MussKumar nurse to please send a refill request to his pharmacy  His insurance company said to do so   Rx: Losartan   [facility] [place]   Thank you

## 2014-09-11 ENCOUNTER — Other Ambulatory Visit: Payer: Self-pay | Admitting: *Deleted

## 2014-09-11 MED ORDER — VALSARTAN 80 MG PO TABS: 80.0000 mg | ORAL_TABLET | Freq: Every day | ORAL | Status: DC

## 2014-09-11 NOTE — Telephone Encounter (Signed)
Had to switch rx to Diovan. Medicaid will only cover Diovan now. Rx sent to pharmacy.

## 2014-09-11 NOTE — Telephone Encounter (Signed)
Medicaid required medication change. Switch from losartan to diovan.

## 2014-09-12 ENCOUNTER — Telehealth: Payer: Self-pay | Admitting: Endocrinology

## 2014-09-12 NOTE — Telephone Encounter (Signed)
Left voicemail advising previous BP medication has been changed to Diovan per pt's insurance formulary of 2016. Requested call back if pt would like to discuss

## 2014-09-12 NOTE — Telephone Encounter (Signed)
Patient need a prior Auth on B/P medication, he doesn't know the name of it.

## 2014-09-15 ENCOUNTER — Other Ambulatory Visit: Payer: Self-pay | Admitting: *Deleted

## 2014-09-15 ENCOUNTER — Other Ambulatory Visit (INDEPENDENT_AMBULATORY_CARE_PROVIDER_SITE_OTHER): Payer: Medicaid Other

## 2014-09-15 DIAGNOSIS — E119 Type 2 diabetes mellitus without complications: Secondary | ICD-10-CM | POA: Diagnosis not present

## 2014-09-15 DIAGNOSIS — E1121 Type 2 diabetes mellitus with diabetic nephropathy: Secondary | ICD-10-CM | POA: Diagnosis not present

## 2014-09-15 LAB — COMPREHENSIVE METABOLIC PANEL
ALBUMIN: 3.3 g/dL — AB (ref 3.5–5.2)
ALK PHOS: 55 U/L (ref 39–117)
ALT: 11 U/L (ref 0–53)
AST: 15 U/L (ref 0–37)
BUN: 41 mg/dL — ABNORMAL HIGH (ref 6–23)
CO2: 26 mEq/L (ref 19–32)
Calcium: 9.8 mg/dL (ref 8.4–10.5)
Chloride: 107 mEq/L (ref 96–112)
Creatinine, Ser: 1.47 mg/dL (ref 0.40–1.50)
GFR: 52.16 mL/min — ABNORMAL LOW (ref >60.00)
GLUCOSE: 114 mg/dL — AB (ref 70–99)
POTASSIUM: 4.1 meq/L (ref 3.5–5.1)
SODIUM: 139 meq/L (ref 135–145)
TOTAL PROTEIN: 6.8 g/dL (ref 6.0–8.3)
Total Bilirubin: 0.3 mg/dL (ref 0.2–1.2)

## 2014-09-15 LAB — URINALYSIS, ROUTINE W REFLEX MICROSCOPIC
BILIRUBIN URINE: NEGATIVE
HGB URINE DIPSTICK: NEGATIVE
Ketones, ur: NEGATIVE
Leukocytes, UA: NEGATIVE
Nitrite: NEGATIVE
RBC / HPF: NONE SEEN (ref 0–?)
Specific Gravity, Urine: 1.015 (ref 1.000–1.030)
Total Protein, Urine: 100 — AB
URINE GLUCOSE: NEGATIVE
Urobilinogen, UA: 0.2 (ref 0.0–1.0)
pH: 6.5 (ref 5.0–8.0)

## 2014-09-15 LAB — CBC WITH DIFFERENTIAL/PLATELET
BASOS PCT: 0.5 % (ref 0.0–3.0)
Basophils Absolute: 0 10*3/uL (ref 0.0–0.1)
EOS ABS: 0.2 10*3/uL (ref 0.0–0.7)
EOS PCT: 2.4 % (ref 0.0–5.0)
HCT: 31.6 % — ABNORMAL LOW (ref 39.0–52.0)
HEMOGLOBIN: 10.5 g/dL — AB (ref 13.0–17.0)
LYMPHS PCT: 24.2 % (ref 12.0–46.0)
Lymphs Abs: 2.2 10*3/uL (ref 0.7–4.0)
MCHC: 33.3 g/dL (ref 30.0–36.0)
MCV: 80.3 fl (ref 78.0–100.0)
Monocytes Absolute: 0.5 10*3/uL (ref 0.1–1.0)
Monocytes Relative: 5 % (ref 3.0–12.0)
NEUTROS ABS: 6.2 10*3/uL (ref 1.4–7.7)
Neutrophils Relative %: 67.9 % (ref 43.0–77.0)
Platelets: 226 10*3/uL (ref 150.0–400.0)
RBC: 3.94 Mil/uL — ABNORMAL LOW (ref 4.22–5.81)
RDW: 14.3 % (ref 11.5–15.5)
WBC: 9.1 10*3/uL (ref 4.0–10.5)

## 2014-09-15 LAB — MICROALBUMIN / CREATININE URINE RATIO
CREATININE, U: 59 mg/dL
Microalb Creat Ratio: 230.4 mg/g — ABNORMAL HIGH (ref 0.0–30.0)
Microalb, Ur: 136 mg/dL — ABNORMAL HIGH (ref 0.0–1.9)

## 2014-09-15 LAB — HEMOGLOBIN A1C: Hgb A1c MFr Bld: 6.4 % (ref 4.6–6.5)

## 2014-09-19 ENCOUNTER — Telehealth: Payer: Self-pay | Admitting: Endocrinology

## 2014-09-20 ENCOUNTER — Encounter: Payer: Self-pay | Admitting: Endocrinology

## 2014-09-20 ENCOUNTER — Ambulatory Visit (INDEPENDENT_AMBULATORY_CARE_PROVIDER_SITE_OTHER): Payer: Medicaid Other | Admitting: Endocrinology

## 2014-09-20 VITALS — BP 144/72 | HR 68 | Temp 98.3°F | Resp 14 | Ht 68.0 in | Wt 279.6 lb

## 2014-09-20 DIAGNOSIS — E785 Hyperlipidemia, unspecified: Secondary | ICD-10-CM

## 2014-09-20 DIAGNOSIS — D509 Iron deficiency anemia, unspecified: Secondary | ICD-10-CM

## 2014-09-20 DIAGNOSIS — I10 Essential (primary) hypertension: Secondary | ICD-10-CM

## 2014-09-20 DIAGNOSIS — E1121 Type 2 diabetes mellitus with diabetic nephropathy: Secondary | ICD-10-CM | POA: Diagnosis not present

## 2014-09-20 MED ORDER — LISINOPRIL 5 MG PO TABS: 5.0000 mg | ORAL_TABLET | Freq: Every day | ORAL | Status: DC

## 2014-09-20 NOTE — Patient Instructions (Addendum)
Please check blood sugars at least half the time about 2 hours after any meal and 3 times per week on waking up. Please bring blood sugar monitor to each visit. Recommended blood sugar levels about 2 hours after meal is 140-180 and on waking up 90-130  Check coverage of Losartan

## 2014-09-20 NOTE — Progress Notes (Signed)
Patient ID: Alexander Hubbard, male   DOB: 08/07/1955, 59 y.o.   MRN: [id number]   Reason for Appointment: Diabetes follow-up   History of Present Illness   Diagnosis: Type 2 DIABETES MELITUS, insulin requiring     PAST history: He has been on relatively large doses of insulin for a few years and was not well controlled until he started the U-500 insulin This particularly help his fasting hyperglycemia which was difficult to control. He only benefited partially from Victoza and this did not help him with weight loss significantly A1c had increased in 8/14 He  had significant improvement in his control after bariatric surgery in early April 2015 Previously was on basal bolus insulin regimen.  RECENT history:   With the progressive weight loss he had been able to get off insulin and is now taking only metformin 500 mg twice a day His creatinine clearance is still over 50 However his weight loss has leveled off and recently has gained back some weight; his wife thinks he is eating larger portions He is eating inconsistent meals and sometimes only a protein shake with fruit. He has not been able to check his sugars after meals as directed and only in the morning despite reminders A1c is still normal at 6.4 However his fasting readings are still above normal, a couple of times over 140 because of unrelated medical problems  Monitors blood glucose: Usually once a day Glucometer: Accu-Chek        Blood Glucose readings from download:  PRE-MEAL Breakfast Lunch Dinner Bedtime Overall  Glucose range:  111 -147    105     Mean/median:  128      127    Hypoglycemia:  none            Physical activity: exercise:  At Gym uses bike 45 min plus weights Prior weight 406            Wt Readings from Last 3 Encounters:  09/20/14 279 lb 9.6 oz (126.826 kg)  08/10/14 276 lb (125.193 kg)  08/02/14 274 lb (124.286 kg)   Diabetes labs:  Lab Results  Component Value Date   HGBA1C 6.4 09/15/2014    HGBA1C 6.5 07/03/2014   HGBA1C 6.4 12/26/2013   Lab Results  Component Value Date   MICROALBUR 136.0* 09/15/2014   LDLCALC 27 10/11/2013   CREATININE 1.47 09/15/2014   Microalbumin/creatinine ratio is 230  Other labs:   Lab on 09/15/2014  Component Date Value Ref Range Status  . Hgb A1c MFr Bld 09/15/2014 6.4  4.6 - 6.5 % Final   Glycemic Control Guidelines for People with Diabetes:Non Diabetic:  <6%Goal of Therapy: <7%Additional Action Suggested:  >8%   . Sodium 09/15/2014 139  135 - 145 mEq/L Final  . Potassium 09/15/2014 4.1  3.5 - 5.1 mEq/L Final  . Chloride 09/15/2014 107  96 - 112 mEq/L Final  . CO2 09/15/2014 26  19 - 32 mEq/L Final  . Glucose, Bld 09/15/2014 114* 70 - 99 mg/dL Final  . BUN 21/30/8657 41* 6 - 23 mg/dL Final  . Creatinine, Ser 09/15/2014 1.47  0.40 - 1.50 mg/dL Final  . Total Bilirubin 09/15/2014 0.3  0.2 - 1.2 mg/dL Final  . Alkaline Phosphatase 09/15/2014 55  39 - 117 U/L Final  . AST 09/15/2014 15  0 - 37 U/L Final  . ALT 09/15/2014 11  0 - 53 U/L Final  . Total Protein 09/15/2014 6.8  6.0 - 8.3 g/dL Final  .  Albumin 09/15/2014 3.3* 3.5 - 5.2 g/dL Final  . Calcium 81/19/147804/15/2016 9.8  8.4 - 10.5 mg/dL Final  . GFR 29/56/213004/15/2016 52.16* >60.00 mL/min Final  . Color, Urine 09/15/2014 YELLOW  Yellow;Lt. Yellow Final  . APPearance 09/15/2014 CLEAR  Clear Final  . Specific Gravity, Urine 09/15/2014 1.015  1.000-1.030 Final  . pH 09/15/2014 6.5  5.0 - 8.0 Final  . Total Protein, Urine 09/15/2014 100* Negative Final  . Urine Glucose 09/15/2014 NEGATIVE  Negative Final  . Ketones, ur 09/15/2014 NEGATIVE  Negative Final  . Bilirubin Urine 09/15/2014 NEGATIVE  Negative Final  . Hgb urine dipstick 09/15/2014 NEGATIVE  Negative Final  . Urobilinogen, UA 09/15/2014 0.2  0.0 - 1.0 Final  . Leukocytes, UA 09/15/2014 NEGATIVE  Negative Final  . Nitrite 09/15/2014 NEGATIVE  Negative Final  . WBC, UA 09/15/2014 0-2/hpf  0-2/hpf Final  . RBC / HPF 09/15/2014 none seen   0-2/hpf Final  . Squamous Epithelial / LPF 09/15/2014 Rare(0-4/hpf)  Rare(0-4/hpf) Final  . Microalb, Ur 09/15/2014 136.0* 0.0 - 1.9 mg/dL Final  . Creatinine,U 86/57/846904/15/2016 59.0   Final  . Microalb Creat Ratio 09/15/2014 230.4* 0.0 - 30.0 mg/g Final  . WBC 09/15/2014 9.1  4.0 - 10.5 K/uL Final  . RBC 09/15/2014 3.94* 4.22 - 5.81 Mil/uL Final  . Hemoglobin 09/15/2014 10.5* 13.0 - 17.0 g/dL Final  . HCT 62/95/284104/15/2016 31.6* 39.0 - 52.0 % Final  . MCV 09/15/2014 80.3  78.0 - 100.0 fl Final  . MCHC 09/15/2014 33.3  30.0 - 36.0 g/dL Final  . RDW 32/44/010204/15/2016 14.3  11.5 - 15.5 % Final  . Platelets 09/15/2014 226.0  150.0 - 400.0 K/uL Final  . Neutrophils Relative % 09/15/2014 67.9  43.0 - 77.0 % Final  . Lymphocytes Relative 09/15/2014 24.2  12.0 - 46.0 % Final  . Monocytes Relative 09/15/2014 5.0  3.0 - 12.0 % Final  . Eosinophils Relative 09/15/2014 2.4  0.0 - 5.0 % Final  . Basophils Relative 09/15/2014 0.5  0.0 - 3.0 % Final  . Neutro Abs 09/15/2014 6.2  1.4 - 7.7 K/uL Final  . Lymphs Abs 09/15/2014 2.2  0.7 - 4.0 K/uL Final  . Monocytes Absolute 09/15/2014 0.5  0.1 - 1.0 K/uL Final  . Eosinophils Absolute 09/15/2014 0.2  0.0 - 0.7 K/uL Final  . Basophils Absolute 09/15/2014 0.0  0.0 - 0.1 K/uL Final        Medication List       This list is accurate as of: 09/20/14  9:03 PM.  Always use your most recent med list.               ACCU-CHEK SMARTVIEW test strip  Generic drug:  glucose blood  1 each by Other route See admin instructions. Check blood sugar 3 times daily.     amLODipine 5 MG tablet  Commonly known as:  NORVASC  Take 1 tablet (5 mg total) by mouth daily.     ammonium lactate 12 % lotion  Commonly known as:  LAC-HYDRIN  Apply 1 application topically 2 (two) times daily as needed (legs).     aspirin EC 81 MG tablet  Take 81 mg by mouth daily.     brinzolamide 1 % ophthalmic suspension  Commonly known as:  AZOPT  Place 1 drop into both eyes 2 (two) times daily.      calcium carbonate 1250 (500 CA) MG chewable tablet  Commonly known as:  OS-CAL  Chew 1 tablet by mouth daily.  carvedilol 3.125 MG tablet  Commonly known as:  COREG  Take 3.125 mg by mouth 2 (two) times daily with a meal.     colchicine 0.6 MG tablet  Take 1 tablet (0.6 mg total) by mouth daily. Hold until follow-up with PCP     CRESTOR 20 MG tablet  Generic drug:  rosuvastatin  TAKE 1 TABLET (20 MG TOTAL) BY MOUTH DAILY.     diazepam 2 MG tablet  Commonly known as:  VALIUM  Take 1 tablet (2 mg total) by mouth every 12 (twelve) hours as needed for muscle spasms.     diclofenac sodium 1 % Gel  Commonly known as:  VOLTAREN  Apply 2 g topically 4 (four) times daily.     docusate sodium 100 MG capsule  Commonly known as:  COLACE  Take 100 mg by mouth 2 (two) times daily.     ferrous sulfate 325 (65 FE) MG EC tablet  Take 325 mg by mouth 3 (three) times daily with meals.     gabapentin 300 MG capsule  Commonly known as:  NEURONTIN  Take 300 mg by mouth 2 (two) times daily.     latanoprost 0.005 % ophthalmic solution  Commonly known as:  XALATAN  Place 1 drop into the left eye at bedtime.     Linaclotide 145 MCG Caps capsule  Commonly known as:  LINZESS  Take 1 capsule (145 mcg total) by mouth daily.     lisinopril 5 MG tablet  Commonly known as:  PRINIVIL,ZESTRIL  Take 1 tablet (5 mg total) by mouth daily.     losartan 25 MG tablet  Commonly known as:  COZAAR     metFORMIN 500 MG tablet  Commonly known as:  GLUCOPHAGE  Take 1 tablet (500 mg total) by mouth 2 (two) times daily with a meal.     methocarbamol 750 MG tablet  Commonly known as:  ROBAXIN-750  Take 1 tablet (750 mg total) by mouth every 6 (six) hours as needed for muscle spasms.     methocarbamol 750 MG tablet  Commonly known as:  ROBAXIN-750  Take 1 tablet (750 mg total) by mouth 4 (four) times daily.     multivitamin capsule  Take 1 capsule by mouth 2 (two) times daily.      NEOMYCIN-POLYMYXIN-HYDROCORTISONE 1 % Soln otic solution  Commonly known as:  CORTISPORIN  PLACE 4 DROPS INTO BOTH EARS 4 (FOUR) TIMES DAILY.     OVER THE COUNTER MEDICATION  Take 1 each by mouth 3 (three) times daily. "Calcium Citrate Chewy Bite 500-500"     OVER THE COUNTER MEDICATION  Take 1 tablet by mouth at bedtime. "Strawberry Flavored Chewable Iron with Vitamin C 18 mg / 30 mg"     oxyCODONE-acetaminophen 5-325 MG per tablet  Commonly known as:  PERCOCET/ROXICET  Take 1-2 tablets by mouth every 6 (six) hours as needed for severe pain.     pantoprazole 40 MG tablet  Commonly known as:  PROTONIX  TAKE 1 TABLET BY MOUTH TWICE A DAY     polyethylene glycol packet  Commonly known as:  MIRALAX / GLYCOLAX  Take 17 g by mouth at bedtime.     prednisoLONE acetate 1 % ophthalmic suspension  Commonly known as:  PRED FORTE  Place 1 drop into the right eye at bedtime.     sildenafil 100 MG tablet  Commonly known as:  VIAGRA  Take 1 tablet (100 mg total) by mouth daily as needed for  erectile dysfunction.     timolol 0.5 % ophthalmic solution  Commonly known as:  TIMOPTIC  Place 1 drop into both eyes 2 (two) times daily.     ULORIC 40 MG tablet  Generic drug:  febuxostat  TAKE 1 TABLET BY MOUTH EVERY DAY     UNABLE TO FIND  - Dispense one pair of Diabetic Shoes.  - ICD E11.49, G62.9, B35.1, R60.9     vitamin B-12 1000 MCG tablet  Commonly known as:  CYANOCOBALAMIN  Take 1,000 mcg by mouth. (Chewable tablet)        Allergies:  Allergies  Allergen Reactions  . Penicillins Swelling and Rash  . Amoxicillin Swelling and Rash  . Niacin Itching and Other (See Comments)    Other reaction(s): Flushing (ALLERGY/intolerance)  . Penicillin G Rash    Past Medical History  Diagnosis Date  . Diabetes mellitus   . Hypertension   . Obesity   . Sleep apnea   . Gouty arthritis     Past Surgical History  Procedure Laterality Date  . Eye surgery    . Knee surgery    .  Gastric bypass      Family History  Problem Relation Age of Onset  . Hypertension Sister   . Diabetes Mellitus II Sister     Social History:  reports that he has never smoked. He has never used smokeless tobacco. He reports that he does not drink alcohol or use illicit drugs.  Review of Systems:   HYPERLIPIDEMIA: Had mostly triglyceride increase prior to bariatric surgery and weight loss. The niacin was stopped by  bariatric surgeon  He is being prescribed Crestor by PCP but not clear why his LDL was high in 2/16 More recent LDL is over 100   Lab Results  Component Value Date   CHOL 190 07/03/2014   HDL 33.80* 07/03/2014   LDLCALC 27 10/11/2013   LDLDIRECT 119.0 07/03/2014   TRIG 241.0* 07/03/2014   CHOLHDL 6 07/03/2014   He has history of neuropathy with sensory loss in his feet Was told not to take gabapentin by nephrologist because of renal dysfunction  HYPERTENSION:  currently on Coreg low dose; blood pressure has been higher more recently He was recommended losartan for hypertension and proteinuria but this was rejected by his insurance as well as valsartan and only has insurance coverage for ACE inhibitors; he is afraid to start this because these were stopped at some point during a hospitalization when his renal function was worse  He does have history of nephropathy with  history of renal dysfunction which  has significantly improved  and creatinine is consistently near normal  Lab Results  Component Value Date   CREATININE 1.47 09/15/2014   ANEMIA:  Patient is complaining of feeling cold and hemoglobin was checked for this reason and is low.  He is already taking iron   Examination:   BP 144/72 mmHg  Pulse 68  Temp(Src) 98.3 F (36.8 C)  Resp 14  Ht 5\' 8"  (1.727 m)  Wt 279 lb 9.6 oz (126.826 kg)  BMI 42.52 kg/m2  SpO2 97%  Body mass index is 42.52 kg/(m^2).   Repeat blood pressure 144/76  No atrophy of fingers in the hand. Tinel's sign is positive on  the left No pedal edema  ASSESSMENT/ PLAN:   Diabetes type 2   His blood sugars appear to be fairly good now although fasting readings are above normal A1c is still upper normal and he is using  only small doses of 500 mg twice a day of metformin Although he has generally been watching his diet he is starting to gain back a little weight from larger portions overall Encouraged him to modify his diet accordingly and increased frequency of exercise  HYPERTENSION: His blood pressure is high normal today  He needs to be on an ACE inhibitor or ARB with his proteinuria which appears to be increasing Since his insurance wants him to fail an ACE inhibitor first will start him on lisinopril 5 mg daily first Discussed that low-dose lisinopril should not affect his potassium and he will continue to watch his dietary intake as before  Will defer management to nephrologist for PCP when he is seen in follow-up next month, will need follow-up renal function and potassium levels  ANEMIA: Discussed that he needs to follow-up with PCP for evaluation and management of anemia despite taking iron  HYPERLIPIDEMIA: LDL is above target, will forward information to PCP for action   Patient Instructions  Please check blood sugars at least half the time about 2 hours after any meal and 3 times per week on waking up. Please bring blood sugar monitor to each visit. Recommended blood sugar levels about 2 hours after meal is 140-180 and on waking up 90-130  Check coverage of Losartan   Total visit time regarding evaluation and management, counseling, medication review, review of labs and paperwork with insurance company = 25 minutes  Will forward information to PCP and nephrologist  [facility] 09/20/2014, 9:03 PM

## 2014-09-21 ENCOUNTER — Encounter: Payer: Self-pay | Admitting: Physician Assistant

## 2014-09-21 ENCOUNTER — Ambulatory Visit (INDEPENDENT_AMBULATORY_CARE_PROVIDER_SITE_OTHER): Payer: Medicaid Other | Admitting: Physician Assistant

## 2014-09-21 VITALS — BP 136/88 | HR 80 | Temp 98.1°F | Resp 18 | Wt 382.0 lb

## 2014-09-21 DIAGNOSIS — M542 Cervicalgia: Secondary | ICD-10-CM | POA: Diagnosis not present

## 2014-09-21 DIAGNOSIS — E1165 Type 2 diabetes mellitus with hyperglycemia: Secondary | ICD-10-CM

## 2014-09-21 DIAGNOSIS — M5020 Other cervical disc displacement, unspecified cervical region: Secondary | ICD-10-CM | POA: Diagnosis not present

## 2014-09-21 MED ORDER — DIAZEPAM 2 MG PO TABS
2.0000 mg | ORAL_TABLET | Freq: Two times a day (BID) | ORAL | Status: DC | PRN
Start: 2014-09-21 — End: 2014-09-21

## 2014-09-21 MED ORDER — DIAZEPAM 2 MG PO TABS: 2.0000 mg | ORAL_TABLET | Freq: Two times a day (BID) | ORAL | Status: DC | PRN

## 2014-09-21 NOTE — Progress Notes (Signed)
Patient ID: Alexander BouquetRiyad Hubbard MRN: [id number], DOB: 1955-11-29, 59 y.o. Date of Encounter: 09/21/2014, 5:06 PM    Chief Complaint:  Chief Complaint  Patient presents with  . c/o neck pain across back of head,neck    needs referral for PT,  request refill of Diazepam  the Robaxin doesn't work     HPI: 59 y.o. year old male here with his daughter.  He brings with him a paper, which is referral form to " [facility]"--signed by Dr. Odette FractionPaul Harkins. Pt states that this is a Midwifeneurosurgeon. Patient states that he was seen by one of Dr. Ollen BowlHarkins' partners while he was in the hospital. Pt states that this referral was for the physical therapist group that is in the same building as the neurosurgeon. However patient was informed that that particular physical therapist place only sees Medicare if they're less than 345 years old. Therefore is needing me to do a referral to a different physical therapist who will accept his insurance.  Also patient is stating that in the past when he was prescribed diazepam 2 mg twice a day that this worked well for his neck pain.  Says that at that time he was taking one twice a day and it was helping his neck pain. Says that the muscle relaxer that he has been prescribed does not help. Wants to go back to the diazepam. Needs prescription for this.  Also he is requesting a referral to a different endocrinologist. He says that he has been frustrated with multiple things regarding his diabetes and is wanting a referral so that he can see a different endocrinologist.     Home Meds:   Outpatient Prescriptions Prior to Visit  Medication Sig Dispense Refill  . amLODipine (NORVASC) 5 MG tablet Take 1 tablet (5 mg total) by mouth daily. 30 tablet 3  . ammonium lactate (LAC-HYDRIN) 12 % lotion Apply 1 application topically 2 (two) times daily as needed (legs). 400 g 2  . aspirin EC 81 MG tablet Take 81 mg by mouth daily.    . brinzolamide (AZOPT)  1 % ophthalmic suspension Place 1 drop into both eyes 2 (two) times daily.      . calcium carbonate (OS-CAL) 1250 (500 CA) MG chewable tablet Chew 1 tablet by mouth daily.    . carvedilol (COREG) 3.125 MG tablet Take 3.125 mg by mouth 2 (two) times daily with a meal.    . colchicine 0.6 MG tablet Take 1 tablet (0.6 mg total) by mouth daily. Hold until follow-up with PCP 30 tablet 5  . CRESTOR 20 MG tablet TAKE 1 TABLET (20 MG TOTAL) BY MOUTH DAILY. 30 tablet 3  . diclofenac sodium (VOLTAREN) 1 % GEL Apply 2 g topically 4 (four) times daily. 100 g 1  . docusate sodium (COLACE) 100 MG capsule Take 100 mg by mouth 2 (two) times daily.    . ferrous sulfate 325 (65 FE) MG EC tablet Take 325 mg by mouth 3 (three) times daily with meals.    . gabapentin (NEURONTIN) 300 MG capsule Take 300 mg by mouth 2 (two) times daily.    Marland Kitchen. glucose blood (ACCU-CHEK SMARTVIEW) test strip 1 each by Other route See admin instructions. Check blood sugar 3 times daily.    Marland Kitchen. latanoprost (XALATAN) 0.005 % ophthalmic solution Place 1 drop into the left eye at bedtime.     . Linaclotide (LINZESS) 145 MCG CAPS capsule Take 1 capsule (145 mcg total) by mouth  daily. 30 capsule 11  . lisinopril (PRINIVIL,ZESTRIL) 5 MG tablet Take 1 tablet (5 mg total) by mouth daily. 30 tablet 3  . metFORMIN (GLUCOPHAGE) 500 MG tablet Take 1 tablet (500 mg total) by mouth 2 (two) times daily with a meal. 60 tablet 3  . methocarbamol (ROBAXIN-750) 750 MG tablet Take 1 tablet (750 mg total) by mouth every 6 (six) hours as needed for muscle spasms. (Patient not taking: Reported on 09/21/2014) 40 tablet 0  . methocarbamol (ROBAXIN-750) 750 MG tablet Take 1 tablet (750 mg total) by mouth 4 (four) times daily. (Patient not taking: Reported on 09/21/2014) 60 tablet 0  . Multiple Vitamin (MULTIVITAMIN) capsule Take 1 capsule by mouth 2 (two) times daily.    . NEOMYCIN-POLYMYXIN-HYDROCORTISONE (CORTISPORIN) 1 % SOLN otic solution PLACE 4 DROPS INTO BOTH EARS 4  (FOUR) TIMES DAILY. 10 mL 0  . OVER THE COUNTER MEDICATION Take 1 each by mouth 3 (three) times daily. "Calcium Citrate Chewy Bite 500-500"    . OVER THE COUNTER MEDICATION Take 1 tablet by mouth at bedtime. "Strawberry Flavored Chewable Iron with Vitamin C 18 mg / 30 mg"    . oxyCODONE-acetaminophen (PERCOCET/ROXICET) 5-325 MG per tablet Take 1-2 tablets by mouth every 6 (six) hours as needed for severe pain. 30 tablet 0  . pantoprazole (PROTONIX) 40 MG tablet TAKE 1 TABLET BY MOUTH TWICE A DAY 60 tablet 11  . polyethylene glycol (MIRALAX / GLYCOLAX) packet Take 17 g by mouth at bedtime.    . prednisoLONE acetate (PRED FORTE) 1 % ophthalmic suspension Place 1 drop into the right eye at bedtime.     . sildenafil (VIAGRA) 100 MG tablet Take 1 tablet (100 mg total) by mouth daily as needed for erectile dysfunction. 6 tablet 12  . timolol (TIMOPTIC) 0.5 % ophthalmic solution Place 1 drop into both eyes 2 (two) times daily.    Marland Kitchen ULORIC 40 MG tablet TAKE 1 TABLET BY MOUTH EVERY DAY 30 tablet 5  . UNABLE TO FIND Dispense one pair of Diabetic Shoes. ICD E11.49, G62.9, B35.1, R60.9 1 each 0  . vitamin B-12 (CYANOCOBALAMIN) 1000 MCG tablet Take 1,000 mcg by mouth. (Chewable tablet)    . diazepam (VALIUM) 2 MG tablet Take 1 tablet (2 mg total) by mouth every 12 (twelve) hours as needed for muscle spasms. 30 tablet 0   No facility-administered medications prior to visit.    Allergies:  Allergies  Allergen Reactions  . Penicillins Swelling and Rash  . Amoxicillin Swelling and Rash  . Niacin Itching and Other (See Comments)    Other reaction(s): Flushing (ALLERGY/intolerance)  . Penicillin G Rash      Review of Systems: See HPI for pertinent ROS. All other ROS negative.    Physical Exam: Blood pressure 136/88, pulse 80, temperature 98.1 F (36.7 C), temperature source Oral, resp. rate 18, weight 382 lb (173.274 kg)., Body mass index is 58.1 kg/(m^2). General: Obese Male. Appears in no acute  distress. Neck: Supple. Keeps his gaze forward and does not turn head. Decreased ROM.  Lungs: Clear bilaterally to auscultation without wheezes, rales, or rhonchi. Breathing is unlabored. Heart: Regular rhythm. No murmurs, rubs, or gallops. Msk:  Strength and tone normal for age. Extremities/Skin: Warm and dry.  Neuro: Alert and oriented X 3. Moves all extremities spontaneously. Gait is normal. CNII-XII grossly in tact. Psych:  Responds to questions appropriately with a normal affect.     ASSESSMENT AND PLAN:  59 y.o. year old male with  1. Protruded cervical disc I reviewed MRI cervical spine report from 08/02/14.  This showed multiple areas of mild disc bulge. Degenerative changes. Trace right C5-6 facet effusion likely reactive. Mild canal stenosis at C5-6. Mild to moderate right C5-6 and C6-7 neural foraminal narrowing. - Ambulatory referral to Physical Therapy - diazepam (VALIUM) 2 MG tablet; Take 1 tablet (2 mg total) by mouth every 12 (twelve) hours as needed for muscle spasms.  Dispense: 60 tablet; Refill: 0  2. Neck pain - Ambulatory referral to Physical Therapy - diazepam (VALIUM) 2 MG tablet; Take 1 tablet (2 mg total) by mouth every 12 (twelve) hours as needed for muscle spasms.  Dispense: 60 tablet; Refill: 0  3. Type 2 diabetes mellitus with hyperglycemia - Ambulatory referral to Endocrinology   Signed, Shon Hale [place], [place], [facility] 09/21/2014 5:06 PM

## 2014-09-22 NOTE — Telephone Encounter (Signed)
error 

## 2014-09-28 ENCOUNTER — Other Ambulatory Visit: Payer: Self-pay | Admitting: Family Medicine

## 2014-09-28 NOTE — Telephone Encounter (Signed)
Ok to refill Robaxin??  Last office visit 09/21/2014.  Last refill 08/10/2014.

## 2014-09-29 NOTE — Telephone Encounter (Signed)
Prescription sent to pharmacy.

## 2014-09-29 NOTE — Telephone Encounter (Signed)
ok 

## 2014-10-02 ENCOUNTER — Other Ambulatory Visit: Payer: Medicaid Other

## 2014-10-02 ENCOUNTER — Telehealth: Payer: Self-pay | Admitting: Family Medicine

## 2014-10-02 NOTE — Telephone Encounter (Signed)
Patient says he is having a hard time getting his crestor filled at the pharmacy  Please call him at [phone]

## 2014-10-03 NOTE — Telephone Encounter (Signed)
PA faxed to [facility].   Dx: E78.5- hyperlipidemia, unspecified.   Patient has tried and failed Lipitor and Zocor.

## 2014-10-03 NOTE — Telephone Encounter (Signed)
Spoke to pt and he is aware that we are working on a PA for his medication

## 2014-10-05 ENCOUNTER — Ambulatory Visit: Payer: Medicaid Other | Admitting: Endocrinology

## 2014-10-05 NOTE — Telephone Encounter (Signed)
Call placed to [facility] to inquire.   PA approved 10/03/2014- 09/28/2015.  PA # [id number]

## 2014-10-10 ENCOUNTER — Telehealth: Payer: Self-pay | Admitting: Family Medicine

## 2014-10-10 ENCOUNTER — Ambulatory Visit: Payer: Medicaid Other | Attending: Family Medicine | Admitting: Physical Therapy

## 2014-10-10 ENCOUNTER — Encounter: Payer: Self-pay | Admitting: Physical Therapy

## 2014-10-10 DIAGNOSIS — M6248 Contracture of muscle, other site: Secondary | ICD-10-CM | POA: Insufficient documentation

## 2014-10-10 DIAGNOSIS — M436 Torticollis: Secondary | ICD-10-CM | POA: Insufficient documentation

## 2014-10-10 DIAGNOSIS — M62838 Other muscle spasm: Secondary | ICD-10-CM

## 2014-10-10 DIAGNOSIS — M5010 Cervical disc disorder with radiculopathy, unspecified cervical region: Secondary | ICD-10-CM | POA: Insufficient documentation

## 2014-10-10 NOTE — Telephone Encounter (Signed)
[phone] PT had came in the office today to speak to you about a referral for therapy.He states that he had went to one apt today and they do not accept medicaid. And he is wanting to speak to you in regards to this matter

## 2014-10-10 NOTE — Therapy (Addendum)
[facility] [address] [place], [place], [address] Phone: [phone]   Fax:  [phone]  Physical Therapy Evaluation, Discharge Patient Details  Name: Alexander BouquetRiyad Etheredge MRN: [id number] Date of Birth: 16-Mar-1956 Referring Provider:  Donita BrooksPickard, Warren T, MD  Encounter Date: 10/10/2014      PT End of Session - 10/10/14 1338    Visit Number 1   Number of Visits 8   Date for PT Re-Evaluation 11/07/14   PT Start Time 1106   PT Stop Time 1150   PT Time Calculation (min) 44 min   Activity Tolerance Patient limited by pain   Behavior [facility] for tasks assessed/performed      Past Medical History  Diagnosis Date  . Diabetes mellitus   . Hypertension   . Obesity   . Sleep apnea   . Gouty arthritis     Past Surgical History  Procedure Laterality Date  . Eye surgery    . Knee surgery    . Gastric bypass      There were no vitals filed for this visit.  Visit Diagnosis:  Cervical disc disorder with radiculopathy of cervical region  Stiffness of neck  Muscle spasms of head and/or neck      Subjective Assessment - 10/10/14 1109    Subjective Pt presents with neck pain which began to increase on 08/02/14. He went to the ED and  received 2 shots.  Pain has improved overall,  but still has days with severe pain.   He cont to have pain, stiffness, radiating pain into L UE and Rt UE sensory changes.  He has difficulty walking, using UE to lift, carry items and turning head.     Patient is accompained by: Family member   Pertinent History HTN, vision, HTN, gastric bypass, diabetes, gout, kidney disease, chronic Rt. vertebral artery dissection   Limitations Sitting;Lifting;Standing;Walking;House hold activities   Diagnostic tests MRI done 08/03/14  Mild canal stenosis at C5-6. Mild to moderate RIGHT C5-6 and C6-7   Currently in Pain? Yes   Pain Score 8    Pain Location Neck   Pain Orientation Right;Left;Posterior   Pain  Onset More than a month ago   Pain Frequency Intermittent   Aggravating Factors  movement   Pain Relieving Factors hot shower, rest, lying down            [facility] PT Assessment - 10/10/14 1118    Assessment   Medical Diagnosis Cervical disc protrusion   Onset Date 08/02/14  chronic   Prior Therapy for low back   Precautions   Precautions None;Other (comment)   Precaution Comments chronic vertebral A dissection NO TRACTION   Restrictions   Weight Bearing Restrictions No   Balance Screen   Has the patient fallen in the past 6 months No   Has the patient had a decrease in activity level because of a fear of falling?  Yes   Is the patient reluctant to leave their home because of a fear of falling?  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   Living Enviornment Private residence   Living Arrangements Spouse/significant other   Home Access Stairs to enter   Entrance Stairs-Number of Steps 3   Prior Function   Level of Independence Requires assistive device for independence;Needs assistance with ADLs;Needs assistance with homemaking   Cognition   Overall Cognitive Status Within Functional Limits for tasks assessed   Observation/Other Assessments   Observations observed to  have difficulty sit to stand, needs UE assist to balance and has wide BOS   Focus on Therapeutic Outcomes (FOTO)  79%   Sensation   Light Touch Impaired by gross assessment  Rt UE    Posture/Postural Control   Posture/Postural Control Postural limitations   Postural Limitations Rounded Shoulders;Forward head   AROM   AROM Assessment Site --  pain with all cervical planes but extension was minimal to n   Right Shoulder Flexion 110 Degrees   Right Shoulder ABduction 100 Degrees   Right Shoulder Internal Rotation --  reach to low back   Right Shoulder External Rotation --  reach behind head with neck flexed, pain   Left Shoulder Flexion 110 Degrees   Left Shoulder ABduction 100 Degrees   Left  Shoulder Internal Rotation --  reach to low back   Left Shoulder External Rotation --  reach behind head with neck flexed, pain   Cervical Flexion 35   Cervical Extension 20  lightheaded   Cervical - Right Side Bend 20   Cervical - Left Side Bend 16   Cervical - Right Rotation 35   Cervical - Left Rotation 38   Strength   Right Shoulder Flexion 3-/5   Right Shoulder ABduction 3+/5   Left Shoulder Flexion 4-/5   Left Shoulder ABduction 3-/5   Right Elbow Flexion 4/5   Right Elbow Extension 4+/5   Left Elbow Flexion 4/5   Left Elbow Extension 4+/5   Palpation   Palpation tender, sore across bilateral shoulders, suboccipitals, upper traps   Special Tests   Cervical Tests Dictraction   Distraction Test   Findngs Positive   side --  relieved pain bilaterally UEs in supine    Comment manual traction   Bed Mobility   Bed Mobility --  painful transitions, min A   Ambulation/Gait   Ambulation/Gait Yes   Ambulation/Gait Assistance 5: Supervision   Ambulation Distance (Feet) 150 Feet   Assistive device --  has cane and RW but did not use today      Self care: Use of traction (has lumbar at home), HEP for C-spine, anatomy of C- disc and relation to posture/forward head,  posture/body mech and financial concerns.       PT Education - 10/10/14 1335    Education provided Yes   Education Details PT/POC, disc protrusion, MCD coverage for PT, HEP   Person(s) Educated Patient;Spouse   Methods Explanation;Demonstration   Comprehension Verbalized understanding;Returned demonstration          PT Short Term Goals - 10/10/14 1347    PT SHORT TERM GOAL #1   Title STG=LTG           PT Long Term Goals - 10/10/14 1347    PT LONG TERM GOAL #1   Title Goals to be set by PT if patient returns for treatment-JP               Plan - 10/10/14 1339    Clinical Impression Statement Patient presents with signs and symptoms consistent with cervical disc pathology/spondylosis.   He responded well to manual traction however, with history of vertebral artery dissection, will not do mechanical traction as hoped.  He also does not seem to have MCR and so PT will not be covered for this diagnosis due to financial reasons (MCD only).  I left the chart open in case he returns with evidence of MCR or with [facility] .     Pt  will benefit from skilled therapeutic intervention in order to improve on the following deficits Abnormal gait;Decreased range of motion;Difficulty walking;Impaired flexibility;Improper body mechanics;Postural dysfunction;Impaired sensation;Decreased endurance;Cardiopulmonary status limiting activity;Decreased activity tolerance;Increased fascial restricitons;Obesity;Impaired UE functional use;Pain;Increased muscle spasms;Decreased balance;Decreased mobility;Decreased strength   Rehab Potential Fair   PT Frequency 2x / week  vs 1 time visit (MCD)   PT Duration 4 weeks  may extend POC if covered by [facility]   PT Treatment/Interventions Moist Heat;ADLs/Self Care Home Management;Therapeutic activities;Patient/family education;Passive range of motion;Therapeutic exercise;DME Instruction;Manual techniques;Cryotherapy;Neuromuscular re-education;Functional mobility training;Electrical Stimulation   PT Next Visit Plan If he returns, perform manual NOT mechanical traction and give cervical stab ex, UE stretching in supine   PT Home Exercise Plan chin tuck and slight C extension with towel   Consulted and Agree with Plan of Care Patient;Family member/caregiver   Family Member Consulted spouse          G-Codes - 2014/10/20 1348    Functional Assessment Tool Used FOTO   Functional Limitation Changing and maintaining body position   Changing and Maintaining Body Position Current Status [phone]) At least 80 percent but less than 100 percent impaired, limited or restricted  79%   Changing and Maintaining Body Position Goal Status (U0454) At least 60 percent but  less than 80 percent impaired, limited or restricted  53%       Problem List Patient Active Problem List   Diagnosis Date Noted  . Protruded cervical disc 09/21/2014  . Neck pain 09/21/2014  . Headache 08/03/2014  . Accidental drug overdose 09/19/2013  . Syncope 08/04/2013  . Acute bronchitis 06/22/2013  . Swelling of limb 05/13/2013  . Pain in limb 05/13/2013  . Varicose veins of lower extremities with other complications 05/13/2013  . Chronic venous insufficiency 05/13/2013  . Chronic kidney disease, stage III (moderate) 05/12/2013  . Hypoxemia 03/31/2013  . Chronic kidney disease, stage IV (severe) 03/30/2013  . SIRS (systemic inflammatory response syndrome) 03/30/2013  . Diarrhea 03/30/2013  . DM2 (diabetes mellitus, type 2) 01/05/2013  . Chest pain 09/22/2012  . IDDM (insulin dependent diabetes mellitus)   . Glaucoma   . OSA (obstructive sleep apnea)   . Gout   . Diastolic CHF, chronic   . HTN (hypertension)   . HLD (hyperlipidemia)   . Peripheral neuropathy   . Morbid obesity   . Renal failure, chronic   . Peripheral edema     PAA,JENNIFER 10/20/2014, 1:51 PM  [facility] [address] [place], [place], [address] Phone: [phone]   Fax:  [phone]   Karie Mainland, PT 08/28/2015 9:09 AM Phone: [phone] Fax: [phone]

## 2014-10-10 NOTE — Patient Instructions (Addendum)
Instructed patient to use caution with cervical ext, avoid dizziness.

## 2014-10-11 NOTE — Telephone Encounter (Signed)
Pt coming in tomorrow for ov will talk with pt at this time

## 2014-10-12 ENCOUNTER — Encounter: Payer: Self-pay | Admitting: Family Medicine

## 2014-10-12 ENCOUNTER — Ambulatory Visit (INDEPENDENT_AMBULATORY_CARE_PROVIDER_SITE_OTHER): Payer: Medicaid Other | Admitting: Family Medicine

## 2014-10-12 VITALS — BP 120/62 | HR 84 | Temp 98.5°F | Resp 18 | Ht 68.0 in | Wt 279.0 lb

## 2014-10-12 DIAGNOSIS — J208 Acute bronchitis due to other specified organisms: Secondary | ICD-10-CM | POA: Diagnosis not present

## 2014-10-12 DIAGNOSIS — R1011 Right upper quadrant pain: Secondary | ICD-10-CM | POA: Diagnosis not present

## 2014-10-12 DIAGNOSIS — M25511 Pain in right shoulder: Secondary | ICD-10-CM

## 2014-10-12 DIAGNOSIS — R109 Unspecified abdominal pain: Secondary | ICD-10-CM

## 2014-10-12 MED ORDER — AZITHROMYCIN 250 MG PO TABS: ORAL_TABLET | ORAL | Status: DC

## 2014-10-12 NOTE — Progress Notes (Signed)
Subjective:    Patient ID: Alexander BouquetRiyad Rozier, male    DOB: 1955/09/19, 59 y.o.   MRN: [id number]  HPI  He went to physical therapy recently.  Shortly thereafter he developed pain in his right shoulder. He has severe pain with abduction greater than 90. He has pain with internal rotation and neck on rotation. It hurts to touch his shoulder over the before meals joint. It hurts to sleep on his shoulder. He also complains of some pain in the right side in the axillary line. He is tender to palpation over the ribs there. It hurts to twist and bend and move. He denies any nausea vomiting diarrhea melena hematochezia or fever. He does report a cough productive of green sputum over the last 2 days. On examination today the patient has right-sided Rales in the upper lobe. He has rhonchorous breath sounds. He does report some mild pleurisy in that area. Past Medical History  Diagnosis Date  . Diabetes mellitus   . Hypertension   . Obesity   . Sleep apnea   . Gouty arthritis    Past Surgical History  Procedure Laterality Date  . Eye surgery    . Knee surgery    . Gastric bypass     Current Outpatient Prescriptions on File Prior to Visit  Medication Sig Dispense Refill  . amLODipine (NORVASC) 5 MG tablet Take 1 tablet (5 mg total) by mouth daily. 30 tablet 3  . ammonium lactate (LAC-HYDRIN) 12 % lotion Apply 1 application topically 2 (two) times daily as needed (legs). 400 g 2  . aspirin EC 81 MG tablet Take 81 mg by mouth daily.    . brinzolamide (AZOPT) 1 % ophthalmic suspension Place 1 drop into both eyes 2 (two) times daily.      . calcium carbonate (OS-CAL) 1250 (500 CA) MG chewable tablet Chew 1 tablet by mouth daily.    . carvedilol (COREG) 3.125 MG tablet Take 3.125 mg by mouth 2 (two) times daily with a meal.    . colchicine 0.6 MG tablet Take 1 tablet (0.6 mg total) by mouth daily. Hold until follow-up with PCP 30 tablet 5  . CRESTOR 20 MG tablet TAKE 1 TABLET BY MOUTH DAILY 30 tablet 3    . diazepam (VALIUM) 2 MG tablet Take 1 tablet (2 mg total) by mouth every 12 (twelve) hours as needed for muscle spasms. 60 tablet 0  . diclofenac sodium (VOLTAREN) 1 % GEL Apply 2 g topically 4 (four) times daily. 100 g 1  . docusate sodium (COLACE) 100 MG capsule Take 100 mg by mouth 2 (two) times daily.    . ferrous sulfate 325 (65 FE) MG EC tablet Take 325 mg by mouth 3 (three) times daily with meals.    . gabapentin (NEURONTIN) 300 MG capsule Take 300 mg by mouth 2 (two) times daily.    Marland Kitchen. glucose blood (ACCU-CHEK SMARTVIEW) test strip 1 each by Other route See admin instructions. Check blood sugar 3 times daily.    Marland Kitchen. latanoprost (XALATAN) 0.005 % ophthalmic solution Place 1 drop into the left eye at bedtime.     . Linaclotide (LINZESS) 145 MCG CAPS capsule Take 1 capsule (145 mcg total) by mouth daily. 30 capsule 11  . lisinopril (PRINIVIL,ZESTRIL) 5 MG tablet Take 1 tablet (5 mg total) by mouth daily. 30 tablet 3  . metFORMIN (GLUCOPHAGE) 500 MG tablet Take 1 tablet (500 mg total) by mouth 2 (two) times daily with a meal. 60 tablet  3  . methocarbamol (ROBAXIN) 750 MG tablet TAKE 1 TABLET (750 MG TOTAL) BY MOUTH 4 (FOUR) TIMES DAILY. 60 tablet 0  . Multiple Vitamin (MULTIVITAMIN) capsule Take 1 capsule by mouth 2 (two) times daily.    . NEOMYCIN-POLYMYXIN-HYDROCORTISONE (CORTISPORIN) 1 % SOLN otic solution PLACE 4 DROPS INTO BOTH EARS 4 (FOUR) TIMES DAILY. 10 mL 0  . OVER THE COUNTER MEDICATION Take 1 each by mouth 3 (three) times daily. "Calcium Citrate Chewy Bite 500-500"    . OVER THE COUNTER MEDICATION Take 1 tablet by mouth at bedtime. "Strawberry Flavored Chewable Iron with Vitamin C 18 mg / 30 mg"    . oxyCODONE-acetaminophen (PERCOCET/ROXICET) 5-325 MG per tablet Take 1-2 tablets by mouth every 6 (six) hours as needed for severe pain. 30 tablet 0  . pantoprazole (PROTONIX) 40 MG tablet TAKE 1 TABLET BY MOUTH TWICE A DAY 60 tablet 11  . polyethylene glycol (MIRALAX / GLYCOLAX) packet  Take 17 g by mouth at bedtime.    . prednisoLONE acetate (PRED FORTE) 1 % ophthalmic suspension Place 1 drop into the right eye at bedtime.     . sildenafil (VIAGRA) 100 MG tablet Take 1 tablet (100 mg total) by mouth daily as needed for erectile dysfunction. 6 tablet 12  . timolol (TIMOPTIC) 0.5 % ophthalmic solution Place 1 drop into both eyes 2 (two) times daily.    Marland Kitchen ULORIC 40 MG tablet TAKE 1 TABLET EVERY DAY 30 tablet 5  . UNABLE TO FIND Dispense one pair of Diabetic Shoes. ICD E11.49, G62.9, B35.1, R60.9 1 each 0  . vitamin B-12 (CYANOCOBALAMIN) 1000 MCG tablet Take 1,000 mcg by mouth. (Chewable tablet)     No current facility-administered medications on file prior to visit.   Allergies  Allergen Reactions  . Penicillins Swelling and Rash  . Amoxicillin Swelling and Rash  . Niacin Itching and Other (See Comments)    Other reaction(s): Flushing (ALLERGY/intolerance)  . Penicillin G Rash   History   Social History  . Marital Status: Married    Spouse Name: N/A  . Number of Children: N/A  . Years of Education: N/A   Occupational History  . Not on file.   Social History Main Topics  . Smoking status: Never Smoker   . Smokeless tobacco: Never Used  . Alcohol Use: No  . Drug Use: No  . Sexual Activity: Not on file   Other Topics Concern  . Not on file   Social History Narrative     Review of Systems  All other systems reviewed and are negative.      Objective:   Physical Exam  Cardiovascular: Normal rate, regular rhythm and normal heart sounds.   No murmur heard. Pulmonary/Chest: Effort normal. He has rales. He exhibits tenderness.  Abdominal: Soft. Bowel sounds are normal. He exhibits no distension. There is tenderness.  Musculoskeletal:       Right shoulder: He exhibits decreased range of motion, tenderness, bony tenderness and pain. He exhibits no swelling, no effusion, no crepitus, no deformity and normal strength.  Vitals reviewed.           Assessment & Plan:  Acute bronchitis due to other specified organisms - Plan: azithromycin (ZITHROMAX) 250 MG tablet  Right shoulder pain  Abdominal wall pain in right flank  Patient has bronchitis. I will treat that with a Z-Pak particular given his purulent sputum. I believe the patient strained his rotator cuff during physical therapy. I offered a cortisone injection but he  declined that today. I recommended rest and tincture of time muscle relaxers and warm compresses. If the pain is not getting any better after about a week I would recommend a cortisone injection. Also believe he strained an oblique muscle in his abdomen with exercise. He is relatively sedentary and I believe he injured this while he was in physical therapy. There is no evidence of an acute abdomen today. I recommended tincture of time and muscle relaxers for this as well. Certainly if worrisome symptoms develop he should notify me immediately for right now the pain is reproducible with movement, rotation, and over palpation of the oblique muscles in the axillary line

## 2014-10-31 ENCOUNTER — Ambulatory Visit (INDEPENDENT_AMBULATORY_CARE_PROVIDER_SITE_OTHER): Payer: Medicaid Other | Admitting: Podiatry

## 2014-10-31 ENCOUNTER — Encounter: Payer: Self-pay | Admitting: Podiatry

## 2014-10-31 DIAGNOSIS — B351 Tinea unguium: Secondary | ICD-10-CM

## 2014-10-31 DIAGNOSIS — G629 Polyneuropathy, unspecified: Secondary | ICD-10-CM

## 2014-10-31 DIAGNOSIS — M79673 Pain in unspecified foot: Secondary | ICD-10-CM

## 2014-10-31 NOTE — Progress Notes (Signed)
Patient ID: [name], male   DOB: 01-16-1956, 59 y.o.   MRN: [id number] Complaint:  Visit Type: Patient returns to my office for continued preventative foot care services. Complaint: Patient states" my nails have grown long and thick and become painful to walk and wear shoes" Patient has been diagnosed with DM with neuropathy . He presents for preventative foot care services. No changes to ROS  Podiatric Exam: Vascular: dorsalis pedis and posterior tibial pulses are palpable bilateral. Capillary return is immediate. Temperature gradient is WNL. Skin turgor WNL  Sensorium: Diminished  Semmes Weinstein monofilament test. Normal tactile sensation bilaterally. Nail Exam: Pt has thick disfigured discolored nails with subungual debris noted bilateral entire nail hallux through fifth toenails Ulcer Exam: There is no evidence of ulcer or pre-ulcerative changes or infection. Orthopedic Exam: Muscle tone and strength are WNL. No limitations in general ROM. No crepitus or effusions noted. Foot type and digits show no abnormalities. Bony prominences are unremarkable. Skin: No Porokeratosis. No infection or ulcers  Diagnosis:  Tinea unguium, Pain in right toe, pain in left toes  Treatment & Plan Procedures and Treatment: Consent by patient was obtained for treatment procedures. The patient understood the discussion of treatment and procedures well. All questions were answered thoroughly reviewed. Debridement of mycotic and hypertrophic toenails, 1 through 5 bilateral and clearing of subungual debris. No ulceration, no infection noted.  Return Visit-Office Procedure: Patient instructed to return to the office for a follow up visit 3 months for continued evaluation and treatment.

## 2014-11-02 ENCOUNTER — Telehealth: Payer: Self-pay | Admitting: Family Medicine

## 2014-11-02 MED ORDER — ALLOPURINOL 100 MG PO TABS: 200.0000 mg | ORAL_TABLET | Freq: Every day | ORAL | Status: DC

## 2014-11-02 NOTE — Telephone Encounter (Signed)
Insurance will not cover Uloic - per [facility]P ok to change to Allopurinol 200mg  qd - [facility]

## 2014-11-13 ENCOUNTER — Emergency Department (HOSPITAL_COMMUNITY): Payer: Medicaid Other

## 2014-11-13 ENCOUNTER — Encounter (HOSPITAL_COMMUNITY): Payer: Self-pay | Admitting: Emergency Medicine

## 2014-11-13 ENCOUNTER — Emergency Department (HOSPITAL_COMMUNITY)
Admission: EM | Admit: 2014-11-13 | Discharge: 2014-11-13 | Disposition: A | Discharge status: Home / Self Care | Payer: Medicaid Other | Attending: Emergency Medicine | Admitting: Emergency Medicine

## 2014-11-13 ENCOUNTER — Emergency Department (HOSPITAL_BASED_OUTPATIENT_CLINIC_OR_DEPARTMENT_OTHER): Admit: 2014-11-13 | Discharge: 2014-11-13 | Disposition: A | Discharge status: Home / Self Care | Payer: Medicaid Other

## 2014-11-13 ENCOUNTER — Emergency Department (HOSPITAL_BASED_OUTPATIENT_CLINIC_OR_DEPARTMENT_OTHER)
Admit: 2014-11-13 | Discharge: 2014-11-13 | Disposition: A | Discharge status: Home / Self Care | Payer: Medicaid Other | Attending: Emergency Medicine | Admitting: Emergency Medicine

## 2014-11-13 ENCOUNTER — Emergency Department (HOSPITAL_COMMUNITY)
Admission: EM | Admit: 2014-11-13 | Discharge: 2014-11-13 | Disposition: A | Discharge status: Nursing Home (Includes ICF) | Payer: Medicaid Other | Source: Home / Self Care | Attending: Family Medicine | Admitting: Family Medicine

## 2014-11-13 DIAGNOSIS — M25561 Pain in right knee: Secondary | ICD-10-CM

## 2014-11-13 DIAGNOSIS — S8011XA Contusion of right lower leg, initial encounter: Secondary | ICD-10-CM | POA: Insufficient documentation

## 2014-11-13 DIAGNOSIS — S8992XA Unspecified injury of left lower leg, initial encounter: Secondary | ICD-10-CM | POA: Diagnosis present

## 2014-11-13 DIAGNOSIS — I10 Essential (primary) hypertension: Secondary | ICD-10-CM | POA: Diagnosis not present

## 2014-11-13 DIAGNOSIS — M7989 Other specified soft tissue disorders: Secondary | ICD-10-CM | POA: Diagnosis not present

## 2014-11-13 DIAGNOSIS — R52 Pain, unspecified: Secondary | ICD-10-CM

## 2014-11-13 DIAGNOSIS — S8012XA Contusion of left lower leg, initial encounter: Secondary | ICD-10-CM | POA: Insufficient documentation

## 2014-11-13 DIAGNOSIS — Y998 Other external cause status: Secondary | ICD-10-CM | POA: Diagnosis not present

## 2014-11-13 DIAGNOSIS — M1990 Unspecified osteoarthritis, unspecified site: Secondary | ICD-10-CM | POA: Diagnosis not present

## 2014-11-13 DIAGNOSIS — E669 Obesity, unspecified: Secondary | ICD-10-CM | POA: Diagnosis not present

## 2014-11-13 DIAGNOSIS — I739 Peripheral vascular disease, unspecified: Secondary | ICD-10-CM | POA: Diagnosis not present

## 2014-11-13 DIAGNOSIS — M109 Gout, unspecified: Secondary | ICD-10-CM | POA: Diagnosis not present

## 2014-11-13 DIAGNOSIS — M79609 Pain in unspecified limb: Secondary | ICD-10-CM

## 2014-11-13 DIAGNOSIS — Z7982 Long term (current) use of aspirin: Secondary | ICD-10-CM | POA: Diagnosis not present

## 2014-11-13 DIAGNOSIS — Y92410 Unspecified street and highway as the place of occurrence of the external cause: Secondary | ICD-10-CM | POA: Diagnosis not present

## 2014-11-13 DIAGNOSIS — M25562 Pain in left knee: Secondary | ICD-10-CM

## 2014-11-13 DIAGNOSIS — E119 Type 2 diabetes mellitus without complications: Secondary | ICD-10-CM | POA: Diagnosis not present

## 2014-11-13 DIAGNOSIS — Z79899 Other long term (current) drug therapy: Secondary | ICD-10-CM | POA: Diagnosis not present

## 2014-11-13 DIAGNOSIS — M79604 Pain in right leg: Secondary | ICD-10-CM

## 2014-11-13 DIAGNOSIS — W19XXXA Unspecified fall, initial encounter: Secondary | ICD-10-CM

## 2014-11-13 DIAGNOSIS — Z880 Allergy status to penicillin: Secondary | ICD-10-CM | POA: Diagnosis not present

## 2014-11-13 DIAGNOSIS — W010XXA Fall on same level from slipping, tripping and stumbling without subsequent striking against object, initial encounter: Secondary | ICD-10-CM | POA: Diagnosis not present

## 2014-11-13 DIAGNOSIS — M79605 Pain in left leg: Secondary | ICD-10-CM

## 2014-11-13 DIAGNOSIS — Y9301 Activity, walking, marching and hiking: Secondary | ICD-10-CM | POA: Insufficient documentation

## 2014-11-13 DIAGNOSIS — Z8669 Personal history of other diseases of the nervous system and sense organs: Secondary | ICD-10-CM | POA: Diagnosis not present

## 2014-11-13 MED ORDER — ACETAMINOPHEN 500 MG PO TABS
1000.0000 mg | ORAL_TABLET | Freq: Once | ORAL | Status: AC
  Administered 2014-11-13: 1000 mg via ORAL
  Filled 2014-11-13: qty 2

## 2014-11-13 MED ORDER — TRAMADOL HCL 50 MG PO TABS: 50.0000 mg | ORAL_TABLET | Freq: Four times a day (QID) | ORAL | Status: DC | PRN

## 2014-11-13 MED ORDER — TRAMADOL HCL 50 MG PO TABS
50.0000 mg | ORAL_TABLET | Freq: Once | ORAL | Status: AC
  Administered 2014-11-13: 50 mg via ORAL
  Filled 2014-11-13: qty 1

## 2014-11-13 NOTE — ED Notes (Signed)
Pt verbalizes understanding of d/c instructions and denies any further needs at this time. 

## 2014-11-13 NOTE — ED Notes (Signed)
Pt's wife is also being seen; pt requested to wait for wife before he is transferred... Moved pt to his wife's room.

## 2014-11-13 NOTE — ED Notes (Signed)
Gave pt two ice packs to apply to both knees.

## 2014-11-13 NOTE — ED Notes (Signed)
Reports he fell on 6/10 and landed on both of his knees Hurts to walk and put pressure on it Alert, no signs of acute distress.

## 2014-11-13 NOTE — Progress Notes (Signed)
*  PRELIMINARY RESULTS* Vascular Ultrasound Right lower extremity venous duplex has been completed.  Preliminary findings: Negative for DVT. 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 Monophasic flow in PTA.   Spoke with Junius Finner about results.    Farrel Demark, RDMS, RVT  11/13/2014, 4:53 PM

## 2014-11-13 NOTE — ED Provider Notes (Signed)
Alexander Hubbard is a 59 y.o. male who presents to Urgent Care today for fall and knee pain. Patient was his usual state of health on Friday and [place]. He tripped and fell onto his knees. He notes bilateral knee and leg pain. Additionally he notes mild low back pain. He additionally notes right leg swelling. He's had some bilateral leg swelling over the right was worse than the left. He notes right calf tenderness as well. The swelling occurred after an 8 Hour Dr. from [place] to [place]. He denies any chest pains or shortness of breath. Symptoms are moderate.   Past Medical History  Diagnosis Date  . Diabetes mellitus   . Hypertension   . Obesity   . Sleep apnea   . Gouty arthritis    Past Surgical History  Procedure Laterality Date  . Eye surgery    . Knee surgery    . Gastric bypass     History  Substance Use Topics  . Smoking status: Never Smoker   . Smokeless tobacco: Never Used  . Alcohol Use: No   ROS as above Medications: No current facility-administered medications for this encounter.   Current Outpatient Prescriptions  Medication Sig Dispense Refill  . allopurinol (ZYLOPRIM) 100 MG tablet Take 2 tablets (200 mg total) by mouth daily. 60 tablet 6  . amLODipine (NORVASC) 5 MG tablet Take 1 tablet (5 mg total) by mouth daily. 30 tablet 3  . ammonium lactate (LAC-HYDRIN) 12 % lotion Apply 1 application topically 2 (two) times daily as needed (legs). 400 g 2  . aspirin EC 81 MG tablet Take 81 mg by mouth daily.    Marland Kitchen azithromycin (ZITHROMAX) 250 MG tablet 2 tabs poqday1, 1 tab poqday 2-5 6 tablet 0  . brinzolamide (AZOPT) 1 % ophthalmic suspension Place 1 drop into both eyes 2 (two) times daily.      . calcium carbonate (OS-CAL) 1250 (500 CA) MG chewable tablet Chew 1 tablet by mouth daily.    . carvedilol (COREG) 3.125 MG tablet Take 3.125 mg by mouth 2 (two) times daily with a meal.    . colchicine 0.6 MG tablet Take 1 tablet (0.6 mg total) by mouth daily. Hold  until follow-up with PCP 30 tablet 5  . CRESTOR 20 MG tablet TAKE 1 TABLET BY MOUTH DAILY 30 tablet 3  . diazepam (VALIUM) 2 MG tablet Take 1 tablet (2 mg total) by mouth every 12 (twelve) hours as needed for muscle spasms. 60 tablet 0  . diclofenac sodium (VOLTAREN) 1 % GEL Apply 2 g topically 4 (four) times daily. 100 g 1  . docusate sodium (COLACE) 100 MG capsule Take 100 mg by mouth 2 (two) times daily.    . ferrous sulfate 325 (65 FE) MG EC tablet Take 325 mg by mouth 3 (three) times daily with meals.    . gabapentin (NEURONTIN) 300 MG capsule Take 300 mg by mouth 2 (two) times daily.    Marland Kitchen glucose blood (ACCU-CHEK SMARTVIEW) test strip 1 each by Other route See admin instructions. Check blood sugar 3 times daily.    Marland Kitchen latanoprost (XALATAN) 0.005 % ophthalmic solution Place 1 drop into the left eye at bedtime.     . Linaclotide (LINZESS) 145 MCG CAPS capsule Take 1 capsule (145 mcg total) by mouth daily. 30 capsule 11  . lisinopril (PRINIVIL,ZESTRIL) 5 MG tablet Take 1 tablet (5 mg total) by mouth daily. 30 tablet 3  . losartan (COZAAR) 25 MG tablet Take 25 mg by  mouth.    . metFORMIN (GLUCOPHAGE) 500 MG tablet Take 1 tablet (500 mg total) by mouth 2 (two) times daily with a meal. 60 tablet 3  . methocarbamol (ROBAXIN) 750 MG tablet TAKE 1 TABLET (750 MG TOTAL) BY MOUTH 4 (FOUR) TIMES DAILY. 60 tablet 0  . Multiple Vitamin (MULTIVITAMIN) capsule Take 1 capsule by mouth 2 (two) times daily.    . NEOMYCIN-POLYMYXIN-HYDROCORTISONE (CORTISPORIN) 1 % SOLN otic solution PLACE 4 DROPS INTO BOTH EARS 4 (FOUR) TIMES DAILY. 10 mL 0  . OVER THE COUNTER MEDICATION Take 1 each by mouth 3 (three) times daily. "Calcium Citrate Chewy Bite 500-500"    . OVER THE COUNTER MEDICATION Take 1 tablet by mouth at bedtime. "Strawberry Flavored Chewable Iron with Vitamin C 18 mg / 30 mg"    . oxyCODONE-acetaminophen (PERCOCET/ROXICET) 5-325 MG per tablet Take 1-2 tablets by mouth every 6 (six) hours as needed for severe  pain. 30 tablet 0  . pantoprazole (PROTONIX) 40 MG tablet TAKE 1 TABLET BY MOUTH TWICE A DAY 60 tablet 11  . polyethylene glycol (MIRALAX / GLYCOLAX) packet Take 17 g by mouth at bedtime.    . prednisoLONE acetate (PRED FORTE) 1 % ophthalmic suspension Place 1 drop into the right eye at bedtime.     . sildenafil (VIAGRA) 100 MG tablet Take 1 tablet (100 mg total) by mouth daily as needed for erectile dysfunction. 6 tablet 12  . timolol (TIMOPTIC) 0.5 % ophthalmic solution Place 1 drop into both eyes 2 (two) times daily.    Marland Kitchen UNABLE TO FIND Dispense one pair of Diabetic Shoes. ICD E11.49, G62.9, B35.1, R60.9 1 each 0  . vitamin B-12 (CYANOCOBALAMIN) 1000 MCG tablet Take 1,000 mcg by mouth. (Chewable tablet)     Allergies  Allergen Reactions  . Penicillins Swelling and Rash  . Amoxicillin Swelling and Rash  . Niacin Itching and Other (See Comments)    Other reaction(s): Flushing (ALLERGY/intolerance)  . Penicillin G Rash     Exam:  BP 94/62 mmHg  Pulse 68  Temp(Src) 98.5 F (36.9 C) (Oral)  Resp 18  SpO2 96% Gen: Well NAD HEENT: EOMI,  MMM Lungs: Normal work of breathing. CTABL Heart: RRR no MRG Abd: NABS, Soft. Nondistended, Nontender Exts: Brisk capillary refill, warm and well perfused.  Extremities: Bilateral legs are erythematous and mildly swollen. He has tender varicosities in the right leg and a tender right calf. Mild edema present bilaterally. Knees bilaterally are tender with normal motion  No results found for this or any previous visit (from the past 24 hour(s)). No results found.  Assessment and Plan: 59 y.o. male with fall with knee injury and leg swelling. Patient has leg swelling after injury to his extremity followed by an 8 hour car ride. My concern is he may have developed a DVT in his right leg. I am unable to evaluate this problem at [facility] urgent care. Transfer to the emergency department for further evaluation and management  Discussed warning signs  or symptoms. Please see discharge instructions. Patient expresses understanding.     Rodolph Bong, MD 11/13/14 [phone]

## 2014-11-13 NOTE — ED Notes (Signed)
Pt. Stated, Im here from [facility] for further evaluation . I fell on Friday on both knees.  Went to UC and they said I needed to come here cause I might have a blood clot,.I think I need a x ray of my knees.

## 2014-11-13 NOTE — ED Provider Notes (Signed)
CSN: [id number]     Arrival date & time 11/13/14  1457 History  This chart was scribed for non-physician practitioner, Junius Finner, PA-C working with Tilden Fossa, MD by Doreatha Martin, ED scribe. This patient was seen in room TR03C/TR03C and the patient's care was started at 3:18 PM    Chief Complaint  Patient presents with  . Knee Pain  . Fall   The history is provided by the patient. No language interpreter was used.    HPI Comments: Alexander Hubbard is a 59 y.o. male sent from urgent care to Emergency Department complaining of a mechanical fall 3 days ago while crossing the street when he tripped and landed on his knees.  UC concerned for blood clot.  He states associated moderate, gradual onset bilateral knee pain and right shin pain.  Pt has tried lidocaine patches on his knees with mild relief. He takes three  of aspirin daily at home. He denies Hx of PE/DVT. He also denies LOC, head injury and any other injuries. Pt also denies numbness.   PCP: Dr. Tanya Nones, [facility] Pt states he has seen a vascular surgeon but several years ago.  Past Medical History  Diagnosis Date  . Diabetes mellitus   . Hypertension   . Obesity   . Sleep apnea   . Gouty arthritis    Past Surgical History  Procedure Laterality Date  . Eye surgery    . Knee surgery    . Gastric bypass     Family History  Problem Relation Age of Onset  . Hypertension Sister   . Diabetes Mellitus II Sister    History  Substance Use Topics  . Smoking status: Never Smoker   . Smokeless tobacco: Never Used  . Alcohol Use: No    Review of Systems  Musculoskeletal: Positive for arthralgias.  Neurological: Negative for syncope and numbness.  All other systems reviewed and are negative.  Allergies  Penicillins; Amoxicillin; Niacin; and Penicillin g  Home Medications   Prior to Admission medications   Medication Sig Start Date End Date Taking? Authorizing Provider  allopurinol (ZYLOPRIM) 100  MG tablet Take 2 tablets (200 mg total) by mouth daily. 11/02/14   Donita Brooks, MD  amLODipine (NORVASC) 5 MG tablet Take 1 tablet (5 mg total) by mouth daily. 08/04/14   Ripudeep Jenna Luo, MD  ammonium lactate (LAC-HYDRIN) 12 % lotion Apply 1 application topically 2 (two) times daily as needed (legs). 08/30/13   Delories Heinz, DPM  aspirin EC 81 MG tablet Take 81 mg by mouth daily.    Historical Provider, MD  azithromycin (ZITHROMAX) 250 MG tablet 2 tabs poqday1, 1 tab poqday 2-5 10/12/14   Donita Brooks, MD  brinzolamide (AZOPT) 1 % ophthalmic suspension Place 1 drop into both eyes 2 (two) times daily.      Historical Provider, MD  calcium carbonate (OS-CAL) 1250 (500 CA) MG chewable tablet Chew 1 tablet by mouth daily.    Historical Provider, MD  carvedilol (COREG) 3.125 MG tablet Take 3.125 mg by mouth 2 (two) times daily with a meal.    Historical Provider, MD  colchicine 0.6 MG tablet Take 1 tablet (0.6 mg total) by mouth daily. Hold until follow-up with PCP 08/04/14   Ripudeep Jenna Luo, MD  CRESTOR 20 MG tablet TAKE 1 TABLET BY MOUTH DAILY 09/29/14   Donita Brooks, MD  diazepam (VALIUM) 2 MG tablet Take 1 tablet (2 mg total) by mouth every 12 (twelve)  hours as needed for muscle spasms. 09/21/14   Patriciaann Clan Dixon, PA-C  diclofenac sodium (VOLTAREN) 1 % GEL Apply 2 g topically 4 (four) times daily. 06/30/14   Donita Brooks, MD  docusate sodium (COLACE) 100 MG capsule Take 100 mg by mouth 2 (two) times daily.    Historical Provider, MD  ferrous sulfate 325 (65 FE) MG EC tablet Take 325 mg by mouth 3 (three) times daily with meals.    Historical Provider, MD  gabapentin (NEURONTIN) 300 MG capsule Take 300 mg by mouth 2 (two) times daily.    Historical Provider, MD  glucose blood (ACCU-CHEK SMARTVIEW) test strip 1 each by Other route See admin instructions. Check blood sugar 3 times daily.    Historical Provider, MD  latanoprost (XALATAN) 0.005 % ophthalmic solution Place 1 drop into the left eye at  bedtime.     Historical Provider, MD  Linaclotide Karlene Einstein) 145 MCG CAPS capsule Take 1 capsule (145 mcg total) by mouth daily. 04/07/14   Donita Brooks, MD  lisinopril (PRINIVIL,ZESTRIL) 5 MG tablet Take 1 tablet (5 mg total) by mouth daily. 09/20/14   Reather Littler, MD  losartan (COZAAR) 25 MG tablet Take 25 mg by mouth. 10/20/14   Historical Provider, MD  metFORMIN (GLUCOPHAGE) 500 MG tablet Take 1 tablet (500 mg total) by mouth 2 (two) times daily with a meal. 08/06/14   Ripudeep K Rai, MD  methocarbamol (ROBAXIN) 750 MG tablet TAKE 1 TABLET (750 MG TOTAL) BY MOUTH 4 (FOUR) TIMES DAILY. 09/29/14   Donita Brooks, MD  Multiple Vitamin (MULTIVITAMIN) capsule Take 1 capsule by mouth 2 (two) times daily.    Historical Provider, MD  NEOMYCIN-POLYMYXIN-HYDROCORTISONE (CORTISPORIN) 1 % SOLN otic solution PLACE 4 DROPS INTO BOTH EARS 4 (FOUR) TIMES DAILY. 06/05/14   Donita Brooks, MD  OVER THE COUNTER MEDICATION Take 1 each by mouth 3 (three) times daily. "Calcium Citrate Chewy Bite 500-500"    Historical Provider, MD  OVER THE COUNTER MEDICATION Take 1 tablet by mouth at bedtime. "Strawberry Flavored Chewable Iron with Vitamin C 18 mg / 30 mg"    Historical Provider, MD  oxyCODONE-acetaminophen (PERCOCET/ROXICET) 5-325 MG per tablet Take 1-2 tablets by mouth every 6 (six) hours as needed for severe pain. 08/04/14   Ripudeep Jenna Luo, MD  pantoprazole (PROTONIX) 40 MG tablet TAKE 1 TABLET BY MOUTH TWICE A DAY 03/06/14   Donita Brooks, MD  polyethylene glycol [facility] / GLYCOLAX) packet Take 17 g by mouth at bedtime.    Historical Provider, MD  prednisoLONE acetate (PRED FORTE) 1 % ophthalmic suspension Place 1 drop into the right eye at bedtime.     Historical Provider, MD  sildenafil (VIAGRA) 100 MG tablet Take 1 tablet (100 mg total) by mouth daily as needed for erectile dysfunction. 04/14/14   Jake Bathe, MD  timolol (TIMOPTIC) 0.5 % ophthalmic solution Place 1 drop into both eyes 2 (two) times daily.     Historical Provider, MD  traMADol (ULTRAM) 50 MG tablet Take 1 tablet (50 mg total) by mouth every 6 (six) hours as needed. 11/13/14   Junius Finner, PA-C  UNABLE TO FIND Dispense one pair of Diabetic Shoes. ICD E11.49, G62.9, B35.1, R60.9 06/12/14   Donita Brooks, MD  vitamin B-12 (CYANOCOBALAMIN) 1000 MCG tablet Take 1,000 mcg by mouth. (Chewable tablet)    Historical Provider, MD   BP 110/59 mmHg  Pulse 65  Temp(Src) 98 F (36.7 C) (Oral)  Resp 16  Ht 5\' 10"  (1.778 m)  Wt 270 lb (122.471 kg)  BMI 38.74 kg/m2  SpO2 100% Physical Exam  Constitutional: He is oriented to person, place, and time. He appears well-developed and well-nourished.  HENT:  Head: Normocephalic and atraumatic.  Eyes: EOM are normal. Pupils are equal, round, and reactive to light.  Neck: Normal range of motion.  Cardiovascular: Normal rate, regular rhythm and normal heart sounds.   Pulses:      Dorsalis pedis pulses are 1+ on the right side, and 1+ on the left side.       Posterior tibial pulses are 1+ on the right side, and 1+ on the left side.  Bilateral lower extremities: multiple large tortuous veins, hardened. Tender. Right leg more prominent than Left. Pulse easily found via doppler Cap refill both feet: <3 seconds  Pulmonary/Chest: Effort normal and breath sounds normal. No respiratory distress.  Abdominal: Soft. There is no tenderness.  Musculoskeletal: Normal range of motion.  No obvious deformity of lower extremities. FROM. Tenderness to anterior knees along medial joint spaces. Mild edema of right calf compared to left with tenderness to right calf. Palpable cords and nodules that are tender. (See skin exam)  Neurological: He is alert and oriented to person, place, and time.  Skin: Skin is warm and dry. Ecchymosis noted. No petechiae noted. There is erythema. No cyanosis.     Bilateral lower extremities, diffuse ecchymosis consistent with chronic peripheral vascular disease. Skin tear 0.5cm on the  right anterior shin. No active bleeding or discharge.   Psychiatric: He has a normal mood and affect. His behavior is normal.  Nursing note and vitals reviewed.   ED Course  Procedures   7:04 PM  Discussed pt with Dr. Loretha Stapler, requested ABI.  Vascular lab stated results would be inconclusive due to non-compressable vessels.  Attempted ABI in FT.   Left: 0.86, slight abnormal (normal 1.0) Right: 1.09, normal  Discussed findings with Dr. Loretha Stapler, pt is safe for discharge home. Strongly encourage f/u with PCP and Vascular surgery for close monitoring and possible surgical intervention.   DIAGNOSTIC STUDIES: Oxygen Saturation is 96% on RA, adequate by my interpretation.    COORDINATION OF CARE: 3:23 PM Discussed treatment plan with pt at bedside and pt agreed to plan.   Labs Review Labs Reviewed - No data to display  Imaging Review Dg Tibia/fibula Left  11/13/2014   CLINICAL DATA:  Acute left knee pain after fall.  Initial encounter.  EXAM: LEFT TIBIA AND FIBULA - 2 VIEW  COMPARISON:  None.  FINDINGS: There is no evidence of fracture or other focal bone lesions. Vascular calcifications are noted.  IMPRESSION: No significant abnormality seen in the left tibia or fibula.   Electronically Signed   By: Lupita Raider, M.D.   On: 11/13/2014 15:57   Dg Tibia/fibula Right  11/13/2014   CLINICAL DATA:  Status post fall 11/10/2014. Bilateral lower leg pain. Initial encounter.  EXAM: RIGHT TIBIA AND FIBULA - 2 VIEW  COMPARISON:  None.  FINDINGS: No acute bony or joint abnormality is identified. Subcutaneous calcifications in the lower leg are compatible with prior infectious or inflammatory process. Chondrocalcinosis about the right knee is noted.  IMPRESSION: No acute abnormality.  Subcutaneous calcifications consistent with prior infectious or inflammatory process such as dermatomyositis.   Electronically Signed   By: Drusilla Kanner M.D.   On: 11/13/2014 15:57   Dg Knee Complete 4 Views  Left  11/13/2014   CLINICAL DATA:  Status post  fall 11/10/2014. Bilateral knee pain. Initial encounter.  EXAM: LEFT KNEE - COMPLETE 4+ VIEW  COMPARISON:  Plain films left knee 08/05/2011.  FINDINGS: No acute bony or joint abnormality is identified. Chondrocalcinosis is noted. Trace joint effusion is seen. Atherosclerotic vascular disease is identified.  IMPRESSION: No acute abnormality.  Chondrocalcinosis.   Electronically Signed   By: Drusilla Kanner M.D.   On: 11/13/2014 15:56   Dg Knee Complete 4 Views Right  11/13/2014   CLINICAL DATA:  Fall on 11/10/2014.  Bilateral knee pain.  EXAM: RIGHT KNEE - COMPLETE 4+ VIEW  COMPARISON:  08/04/2013  FINDINGS: Meniscal chondrocalcinosis. Tricompartmental spurring. Atherosclerotic vascular calcifications. Chronic linear calcification adjacent to the lateral femoral epicondyle.  Suspected moderate knee effusion on the lateral projection. Prepatellar subcutaneous edema. Cutaneous calcifications anterior to the tibia.  IMPRESSION: 1. CPPD arthropathy. 2. Atherosclerosis. 3. Moderate knee effusion with prepatellar subcutaneous edema.   Electronically Signed   By: Gaylyn Rong M.D.   On: 11/13/2014 15:56     EKG Interpretation None      MDM   Final diagnoses:  Fall from slip, trip, or stumble, initial encounter  Bilateral knee pain  Bilateral leg pain  PVD (peripheral vascular disease)   Pt is a 59yo male sent to ED from UC after a trip and fall 3 days ago. Concern for DVT in Right leg. Exam concerning for chronic PVD. Pt does not chronic discoloration of both legs. Has seen vascular surgery in the past but not recently.  No other injuries from fall. Plain films: moderate knee effusion with prepatellar subcutaneous edema, otherwise negative for acute findings in Left LE and Right Tib/fib Venous U/S: negative for DVT Vascular lab called this provider recommending arterial study due to findings from venous U/S. 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 Monophasic flow in PTA. ABI recommended by Dr. Loretha Stapler. Normal in Right leg, slightly abnormal in Left leg. See above. Pt is safe for discharge home, however, strongly encouraged close f/u with PCP as well as schedule appointment with Vascular surgery for continued monitoring and management of PVD.  Pt and family verbalized understanding and agreement with tx plan.   I personally performed the services described in this documentation, which was scribed in my presence. The recorded information has been reviewed and is accurate.    Junius Finner, PA-C 11/13/14 1923  Blake Divine, MD 11/13/14 2046

## 2014-11-13 NOTE — ED Notes (Signed)
Pt transported to vascular.  °

## 2014-11-13 NOTE — ED Notes (Signed)
Pt arrived back to room from vascular. Pt provided with ice water per PA.

## 2014-11-13 NOTE — Progress Notes (Signed)
*  PRELIMINARY RESULTS* Vascular Ultrasound Right lower extremity arterial duplex has been completed.   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   Preliminary results discussed with Junius Finner, PA-C.  11/13/2014 5:26 PM Gertie Fey, RVT, RDCS, RDMS

## 2014-11-17 ENCOUNTER — Encounter: Payer: Self-pay | Admitting: Vascular Surgery

## 2014-11-21 ENCOUNTER — Encounter: Payer: Self-pay | Admitting: Vascular Surgery

## 2014-11-21 ENCOUNTER — Ambulatory Visit (INDEPENDENT_AMBULATORY_CARE_PROVIDER_SITE_OTHER): Payer: Medicaid Other | Admitting: Vascular Surgery

## 2014-11-21 ENCOUNTER — Encounter (INDEPENDENT_AMBULATORY_CARE_PROVIDER_SITE_OTHER): Payer: Self-pay

## 2014-11-21 VITALS — BP 117/72 | HR 73 | Resp 16 | Ht 70.0 in | Wt 272.0 lb

## 2014-11-21 DIAGNOSIS — M79604 Pain in right leg: Secondary | ICD-10-CM | POA: Diagnosis not present

## 2014-11-21 DIAGNOSIS — R609 Edema, unspecified: Secondary | ICD-10-CM | POA: Diagnosis not present

## 2014-11-21 DIAGNOSIS — I872 Venous insufficiency (chronic) (peripheral): Secondary | ICD-10-CM

## 2014-11-21 DIAGNOSIS — M79605 Pain in left leg: Secondary | ICD-10-CM | POA: Diagnosis not present

## 2014-11-21 NOTE — Progress Notes (Signed)
Patient resents today for continued follow-up of venous hypertension. I had seen him in March 2015. That time he had recently undergone gastric bypass surgery at [facility]. He reports that he has lost over 100 pounds and still losing weight. Had some difficulty with his extremities. He reports that he was in [place] one month ago at a graduation for his knees. He had a very long car ride and then walked approximately 3 miles and tripped and injured both knees. He was seen on return to [facility] emergency department with a duplex showing no DVT.  Fortunately he has healed his right venous stasis ulcer that was present when I saw him in March 2015. 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  Past Medical History  Diagnosis Date  . Diabetes mellitus   . Hypertension   . Obesity   . Sleep apnea   . Gouty arthritis   . Varicose veins     History  Substance Use Topics  . Smoking status: Never Smoker   . Smokeless tobacco: Never Used  . Alcohol Use: No    Family History  Problem Relation Age of Onset  . Hypertension Sister   . Diabetes Mellitus II Sister     Allergies  Allergen Reactions  . Penicillins Swelling and Rash  . Amoxicillin Swelling and Rash  . Niacin Itching and Other (See Comments)    Other reaction(s): Flushing (ALLERGY/intolerance)  . Penicillin G Rash     Current outpatient prescriptions:  .  amLODipine (NORVASC) 5 MG tablet, Take 1 tablet (5 mg total) by mouth daily., Disp: 30 tablet, Rfl: 3 .  ammonium lactate (LAC-HYDRIN) 12 % lotion, Apply 1 application topically 2 (two) times daily as needed (legs)., Disp: 400 g, Rfl: 2 .  aspirin EC 81 MG tablet, Take 325 mg by mouth daily. , Disp: , Rfl:  .  brinzolamide (AZOPT) 1 % ophthalmic suspension, Place 1 drop into both eyes 2 (two) times daily.  , Disp: , Rfl:  .  calcium carbonate (OS-CAL) 1250 (500 CA) MG chewable tablet, Chew 1 tablet by mouth  daily., Disp: , Rfl:  .  carvedilol (COREG) 3.125 MG tablet, Take 3.125 mg by mouth 2 (two) times daily with a meal., Disp: , Rfl:  .  colchicine 0.6 MG tablet, Take 1 tablet (0.6 mg total) by mouth daily. Hold until follow-up with PCP, Disp: 30 tablet, Rfl: 5 .  CRESTOR 20 MG tablet, TAKE 1 TABLET BY MOUTH DAILY, Disp: 30 tablet, Rfl: 3 .  diclofenac sodium (VOLTAREN) 1 % GEL, Apply 2 g topically 4 (four) times daily., Disp: 100 g, Rfl: 1 .  docusate sodium (COLACE) 100 MG capsule, Take 100 mg by mouth 2 (two) times daily., Disp: , Rfl:  .  ferrous sulfate 325 (65 FE) MG EC tablet, Take 325 mg by mouth 3 (three) times daily with meals., Disp: , Rfl:  .  gabapentin (NEURONTIN) 300 MG capsule, Take 300 mg by mouth 2 (two) times daily., Disp: , Rfl:  .  glucose blood (ACCU-CHEK SMARTVIEW) test strip, 1 each by Other route See admin instructions. Check blood sugar 3 times daily., Disp: , Rfl:  .  latanoprost (XALATAN) 0.005 % ophthalmic solution, Place 1 drop into the left eye at bedtime. , Disp: , Rfl:  .  Linaclotide (LINZESS) 145 MCG CAPS capsule, Take 1 capsule (145 mcg total) by mouth daily., Disp: 30 capsule, Rfl: 11 .  lisinopril (PRINIVIL,ZESTRIL)  5 MG tablet, Take 1 tablet (5 mg total) by mouth daily., Disp: 30 tablet, Rfl: 3 .  losartan (COZAAR) 25 MG tablet, Take 25 mg by mouth., Disp: , Rfl:  .  metFORMIN (GLUCOPHAGE) 500 MG tablet, Take 1 tablet (500 mg total) by mouth 2 (two) times daily with a meal., Disp: 60 tablet, Rfl: 3 .  Multiple Vitamin (MULTIVITAMIN) capsule, Take 1 capsule by mouth 2 (two) times daily., Disp: , Rfl:  .  NEOMYCIN-POLYMYXIN-HYDROCORTISONE (CORTISPORIN) 1 % SOLN otic solution, PLACE 4 DROPS INTO BOTH EARS 4 (FOUR) TIMES DAILY., Disp: 10 mL, Rfl: 0 .  OVER THE COUNTER MEDICATION, Take 1 each by mouth 3 (three) times daily. "Calcium Citrate Chewy Bite 500-500", Disp: , Rfl:  .  OVER THE COUNTER MEDICATION, Take 1 tablet by mouth at bedtime. "Strawberry Flavored  Chewable Iron with Vitamin C 18 mg / 30 mg", Disp: , Rfl:  .  pantoprazole (PROTONIX) 40 MG tablet, TAKE 1 TABLET BY MOUTH TWICE A DAY, Disp: 60 tablet, Rfl: 11 .  polyethylene glycol (MIRALAX / GLYCOLAX) packet, Take 17 g by mouth at bedtime., Disp: , Rfl:  .  prednisoLONE acetate (PRED FORTE) 1 % ophthalmic suspension, Place 1 drop into the right eye at bedtime. , Disp: , Rfl:  .  timolol (TIMOPTIC) 0.5 % ophthalmic solution, Place 1 drop into both eyes 2 (two) times daily., Disp: , Rfl:  .  UNABLE TO FIND, Dispense one pair of Diabetic Shoes. ICD E11.49, G62.9, B35.1, R60.9, Disp: 1 each, Rfl: 0 .  vitamin B-12 (CYANOCOBALAMIN) 1000 MCG tablet, Take 1,000 mcg by mouth. (Chewable tablet), Disp: , Rfl:  .  allopurinol (ZYLOPRIM) 100 MG tablet, Take 2 tablets (200 mg total) by mouth daily. (Patient not taking: Reported on 11/21/2014), Disp: 60 tablet, Rfl: 6 .  azithromycin (ZITHROMAX) 250 MG tablet, 2 tabs poqday1, 1 tab poqday 2-5 (Patient not taking: Reported on 11/21/2014), Disp: 6 tablet, Rfl: 0 .  diazepam (VALIUM) 2 MG tablet, Take 1 tablet (2 mg total) by mouth every 12 (twelve) hours as needed for muscle spasms. (Patient not taking: Reported on 11/21/2014), Disp: 60 tablet, Rfl: 0 .  methocarbamol (ROBAXIN) 750 MG tablet, TAKE 1 TABLET (750 MG TOTAL) BY MOUTH 4 (FOUR) TIMES DAILY. (Patient not taking: Reported on 11/21/2014), Disp: 60 tablet, Rfl: 0 .  oxyCODONE-acetaminophen (PERCOCET/ROXICET) 5-325 MG per tablet, Take 1-2 tablets by mouth every 6 (six) hours as needed for severe pain. (Patient not taking: Reported on 11/21/2014), Disp: 30 tablet, Rfl: 0 .  sildenafil (VIAGRA) 100 MG tablet, Take 1 tablet (100 mg total) by mouth daily as needed for erectile dysfunction. (Patient not taking: Reported on 11/21/2014), Disp: 6 tablet, Rfl: 12 .  traMADol (ULTRAM) 50 MG tablet, Take 1 tablet (50 mg total) by mouth every 6 (six) hours as needed. (Patient not taking: Reported on 11/21/2014), Disp: 15  tablet, Rfl: 0  Filed Vitals:   11/21/14 1458  BP: 117/72  Pulse: 73  Resp: 16  Height: 5\' 10"  (1.778 m)  Weight: 272 lb (123.378 kg)    Body mass index is 39.03 kg/(m^2).       On physical exam he does have palpable dorsalis pedis pulses bilaterally. He has marked hemosiderin with swelling bilaterally more so on the right than on the left.  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 He does have the exit of the long branch of the saphenous vein throughout  his thigh to the level of his knee bilaterally and these do feet into the large varicosities  Impression and plan. Continued difficulty with venous hypertension bilaterally. He has been intermittent in his compression usage. Did explain the critical importance he is a will reinitiate his bilateral compression device. We will see him in 3 months with repeat bilateral venous duplex to discuss a potential option for ablation of the saphenous vein bilaterally.

## 2014-11-24 ENCOUNTER — Encounter: Payer: Self-pay | Admitting: Family Medicine

## 2014-11-24 ENCOUNTER — Ambulatory Visit (INDEPENDENT_AMBULATORY_CARE_PROVIDER_SITE_OTHER): Payer: Medicaid Other | Admitting: Family Medicine

## 2014-11-24 VITALS — BP 98/60 | HR 60 | Temp 98.2°F | Resp 18 | Ht 68.0 in | Wt 275.0 lb

## 2014-11-24 DIAGNOSIS — I952 Hypotension due to drugs: Secondary | ICD-10-CM

## 2014-11-24 NOTE — Progress Notes (Signed)
Subjective:    Patient ID: Alexander Hubbard, male    DOB: [birth year], 59 y.o.   MRN: [id number]  HPI Patient has been experiencing low blood pressure recently. He is currently on amlodipine, carvedilol, and losartan. He takes the carvedilol due to his chronic diastolic congestive heart failure. He takes losartan due to his chronic renal insufficiency and for renal protection of his type 2 diabetes. Amlodipine is simply a blood pressure medication. Patient states that he has been having orthostatic dizziness lightheadedness and presyncope when he stands. Past Medical History  Diagnosis Date  . Diabetes mellitus   . Hypertension   . Obesity   . Sleep apnea   . Gouty arthritis   . Varicose veins    Past Surgical History  Procedure Laterality Date  . Eye surgery    . Knee surgery    . Gastric bypass     Current Outpatient Prescriptions on File Prior to Visit  Medication Sig Dispense Refill  . allopurinol (ZYLOPRIM) 100 MG tablet Take 2 tablets (200 mg total) by mouth daily. 60 tablet 6  . ammonium lactate (LAC-HYDRIN) 12 % lotion Apply 1 application topically 2 (two) times daily as needed (legs). 400 g 2  . aspirin EC 81 MG tablet Take 325 mg by mouth daily.     . brinzolamide (AZOPT) 1 % ophthalmic suspension Place 1 drop into both eyes 2 (two) times daily.      . calcium carbonate (OS-CAL) 1250 (500 CA) MG chewable tablet Chew 1 tablet by mouth daily.    . carvedilol (COREG) 3.125 MG tablet Take 3.125 mg by mouth daily.     . colchicine 0.6 MG tablet Take 1 tablet (0.6 mg total) by mouth daily. Hold until follow-up with PCP 30 tablet 5  . CRESTOR 20 MG tablet TAKE 1 TABLET BY MOUTH DAILY 30 tablet 3  . diazepam (VALIUM) 2 MG tablet Take 1 tablet (2 mg total) by mouth every 12 (twelve) hours as needed for muscle spasms. 60 tablet 0  . diclofenac sodium (VOLTAREN) 1 % GEL Apply 2 g topically 4 (four) times daily. 100 g 1  . docusate sodium (COLACE) 100 MG capsule Take 100 mg by mouth 2  (two) times daily.    . ferrous sulfate 325 (65 FE) MG EC tablet Take 325 mg by mouth 3 (three) times daily with meals.    . gabapentin (NEURONTIN) 300 MG capsule Take 300 mg by mouth 2 (two) times daily.    Marland Kitchen glucose blood (ACCU-CHEK SMARTVIEW) test strip 1 each by Other route See admin instructions. Check blood sugar 3 times daily.    Marland Kitchen latanoprost (XALATAN) 0.005 % ophthalmic solution Place 1 drop into the left eye at bedtime.     . Linaclotide (LINZESS) 145 MCG CAPS capsule Take 1 capsule (145 mcg total) by mouth daily. 30 capsule 11  . lisinopril (PRINIVIL,ZESTRIL) 5 MG tablet Take 1 tablet (5 mg total) by mouth daily. 30 tablet 3  . losartan (COZAAR) 25 MG tablet Take 12.5 mg by mouth.     . metFORMIN (GLUCOPHAGE) 500 MG tablet Take 1 tablet (500 mg total) by mouth 2 (two) times daily with a meal. 60 tablet 3  . methocarbamol (ROBAXIN) 750 MG tablet TAKE 1 TABLET (750 MG TOTAL) BY MOUTH 4 (FOUR) TIMES DAILY. 60 tablet 0  . Multiple Vitamin (MULTIVITAMIN) capsule Take 1 capsule by mouth 2 (two) times daily.    . NEOMYCIN-POLYMYXIN-HYDROCORTISONE (CORTISPORIN) 1 % SOLN otic solution PLACE 4 DROPS  INTO BOTH EARS 4 (FOUR) TIMES DAILY. 10 mL 0  . OVER THE COUNTER MEDICATION Take 1 each by mouth 3 (three) times daily. "Calcium Citrate Chewy Bite 500-500"    . OVER THE COUNTER MEDICATION Take 1 tablet by mouth at bedtime. "Strawberry Flavored Chewable Iron with Vitamin C 18 mg / 30 mg"    . oxyCODONE-acetaminophen (PERCOCET/ROXICET) 5-325 MG per tablet Take 1-2 tablets by mouth every 6 (six) hours as needed for severe pain. 30 tablet 0  . pantoprazole (PROTONIX) 40 MG tablet TAKE 1 TABLET BY MOUTH TWICE A DAY 60 tablet 11  . polyethylene glycol (MIRALAX / GLYCOLAX) packet Take 17 g by mouth at bedtime.    . prednisoLONE acetate (PRED FORTE) 1 % ophthalmic suspension Place 1 drop into the right eye at bedtime.     . sildenafil (VIAGRA) 100 MG tablet Take 1 tablet (100 mg total) by mouth daily as  needed for erectile dysfunction. 6 tablet 12  . timolol (TIMOPTIC) 0.5 % ophthalmic solution Place 1 drop into both eyes 2 (two) times daily.    . traMADol (ULTRAM) 50 MG tablet Take 1 tablet (50 mg total) by mouth every 6 (six) hours as needed. 15 tablet 0  . UNABLE TO FIND Dispense one pair of Diabetic Shoes. ICD E11.49, G62.9, B35.1, R60.9 1 each 0  . vitamin B-12 (CYANOCOBALAMIN) 1000 MCG tablet Take 1,000 mcg by mouth. (Chewable tablet)    . amLODipine (NORVASC) 5 MG tablet Take 1 tablet (5 mg total) by mouth daily. (Patient not taking: Reported on 11/24/2014) 30 tablet 3   No current facility-administered medications on file prior to visit.   Allergies  Allergen Reactions  . Penicillins Swelling and Rash  . Amoxicillin Swelling and Rash  . Niacin Itching and Other (See Comments)    Other reaction(s): Flushing (ALLERGY/intolerance)  . Penicillin G Rash   History   Social History  . Marital Status: Married    Spouse Name: N/A  . Number of Children: N/A  . Years of Education: N/A   Occupational History  . Not on file.   Social History Main Topics  . Smoking status: Never Smoker   . Smokeless tobacco: Never Used  . Alcohol Use: No  . Drug Use: No  . Sexual Activity: Not on file   Other Topics Concern  . Not on file   Social History Narrative      Review of Systems  All other systems reviewed and are negative.      Objective:   Physical Exam  Cardiovascular: Normal rate, regular rhythm and normal heart sounds.   No murmur heard. Pulmonary/Chest: Effort normal and breath sounds normal. No respiratory distress. He has no wheezes. He has no rales.  Musculoskeletal: He exhibits no edema.  Vitals reviewed.         Assessment & Plan:  Hypotension due to drugs  Temporarily discontinue amlodipine and carvedilol. Recheck blood pressure next week. If blood pressure is elevated, I would resume carvedilol. Hopefully the patient's dizziness was improved once we  improve his blood pressure

## 2014-12-15 ENCOUNTER — Other Ambulatory Visit: Payer: Self-pay

## 2014-12-18 ENCOUNTER — Ambulatory Visit: Payer: Medicaid Other | Admitting: Family Medicine

## 2014-12-19 ENCOUNTER — Encounter: Payer: Self-pay | Admitting: Family Medicine

## 2014-12-19 ENCOUNTER — Ambulatory Visit (INDEPENDENT_AMBULATORY_CARE_PROVIDER_SITE_OTHER): Payer: Medicaid Other | Admitting: Family Medicine

## 2014-12-19 VITALS — BP 130/70 | HR 68 | Temp 98.0°F | Resp 18 | Ht 68.0 in | Wt 278.0 lb

## 2014-12-19 DIAGNOSIS — J208 Acute bronchitis due to other specified organisms: Secondary | ICD-10-CM | POA: Diagnosis not present

## 2014-12-19 NOTE — Progress Notes (Signed)
Subjective:    Patient ID: Alexander Hubbard, male    DOB: 1956-02-18, 59 y.o.   MRN: [id number]  HPI 59 yo male who reports cough productive of green sputum for 1 week.  He denies fever, chills, sob, doe, or chest pain.  Cough [place] [facility]  Seems to be improving.  He denies rhinorrhea, sinus pain, sore throat. Past Medical History  Diagnosis Date  . Diabetes mellitus   . Hypertension   . Obesity   . Sleep apnea   . Gouty arthritis   . Varicose veins    Past Surgical History  Procedure Laterality Date  . Eye surgery    . Knee surgery    . Gastric bypass     Current Outpatient Prescriptions on File Prior to Visit  Medication Sig Dispense Refill  . allopurinol (ZYLOPRIM) 100 MG tablet Take 2 tablets (200 mg total) by mouth daily. 60 tablet 6  . amLODipine (NORVASC) 5 MG tablet Take 1 tablet (5 mg total) by mouth daily. 30 tablet 3  . ammonium lactate (LAC-HYDRIN) 12 % lotion Apply 1 application topically 2 (two) times daily as needed (legs). 400 g 2  . aspirin EC 81 MG tablet Take 325 mg by mouth daily.     . brinzolamide (AZOPT) 1 % ophthalmic suspension Place 1 drop into both eyes 2 (two) times daily.      . calcium carbonate (OS-CAL) 1250 (500 CA) MG chewable tablet Chew 1 tablet by mouth daily.    . carvedilol (COREG) 3.125 MG tablet Take 3.125 mg by mouth daily.     . colchicine 0.6 MG tablet Take 1 tablet (0.6 mg total) by mouth daily. Hold until follow-up with PCP 30 tablet 5  . CRESTOR 20 MG tablet TAKE 1 TABLET BY MOUTH DAILY 30 tablet 3  . diazepam (VALIUM) 2 MG tablet Take 1 tablet (2 mg total) by mouth every 12 (twelve) hours as needed for muscle spasms. 60 tablet 0  . diclofenac sodium (VOLTAREN) 1 % GEL Apply 2 g topically 4 (four) times daily. 100 g 1  . docusate sodium (COLACE) 100 MG capsule Take 100 mg by mouth 2 (two) times daily.    . ferrous sulfate 325 (65 FE) MG EC tablet Take 325 mg by mouth 3 (three) times daily with meals.    .  gabapentin (NEURONTIN) 300 MG capsule Take 300 mg by mouth 2 (two) times daily.    Marland Kitchen. glucose blood (ACCU-CHEK SMARTVIEW) test strip 1 each by Other route See admin instructions. Check blood sugar 3 times daily.    Marland Kitchen. latanoprost (XALATAN) 0.005 % ophthalmic solution Place 1 drop into the left eye at bedtime.     . Linaclotide (LINZESS) 145 MCG CAPS capsule Take 1 capsule (145 mcg total) by mouth daily. 30 capsule 11  . lisinopril (PRINIVIL,ZESTRIL) 5 MG tablet Take 1 tablet (5 mg total) by mouth daily. 30 tablet 3  . losartan (COZAAR) 25 MG tablet Take 12.5 mg by mouth.     . metFORMIN (GLUCOPHAGE) 500 MG tablet Take 1 tablet (500 mg total) by mouth 2 (two) times daily with a meal. 60 tablet 3  . methocarbamol (ROBAXIN) 750 MG tablet TAKE 1 TABLET (750 MG TOTAL) BY MOUTH 4 (FOUR) TIMES DAILY. 60 tablet 0  . Multiple Vitamin (MULTIVITAMIN) capsule Take 1 capsule by mouth 2 (two) times daily.    . NEOMYCIN-POLYMYXIN-HYDROCORTISONE (CORTISPORIN) 1 % SOLN otic solution PLACE 4 DROPS INTO BOTH EARS 4 (  FOUR) TIMES DAILY. 10 mL 0  . OVER THE COUNTER MEDICATION Take 1 each by mouth 3 (three) times daily. "Calcium Citrate Chewy Bite 500-500"    . OVER THE COUNTER MEDICATION Take 1 tablet by mouth at bedtime. "Strawberry Flavored Chewable Iron with Vitamin C 18 mg / 30 mg"    . oxyCODONE-acetaminophen (PERCOCET/ROXICET) 5-325 MG per tablet Take 1-2 tablets by mouth every 6 (six) hours as needed for severe pain. 30 tablet 0  . pantoprazole (PROTONIX) 40 MG tablet TAKE 1 TABLET BY MOUTH TWICE A DAY 60 tablet 11  . polyethylene glycol (MIRALAX / GLYCOLAX) packet Take 17 g by mouth at bedtime.    . prednisoLONE acetate (PRED FORTE) 1 % ophthalmic suspension Place 1 drop into the right eye at bedtime.     . sildenafil (VIAGRA) 100 MG tablet Take 1 tablet (100 mg total) by mouth daily as needed for erectile dysfunction. 6 tablet 12  . timolol (TIMOPTIC) 0.5 % ophthalmic solution Place 1 drop into both eyes 2 (two)  times daily.    . traMADol (ULTRAM) 50 MG tablet Take 1 tablet (50 mg total) by mouth every 6 (six) hours as needed. 15 tablet 0  . UNABLE TO FIND Dispense one pair of Diabetic Shoes. ICD E11.49, G62.9, B35.1, R60.9 1 each 0  . vitamin B-12 (CYANOCOBALAMIN) 1000 MCG tablet Take 1,000 mcg by mouth. (Chewable tablet)     No current facility-administered medications on file prior to visit.   Allergies  Allergen Reactions  . Penicillins Swelling and Rash  . Amoxicillin Swelling and Rash  . Niacin Itching and Other (See Comments)    Other reaction(s): Flushing (ALLERGY/intolerance)  . Penicillin G Rash   History   Social History  . Marital Status: Married    Spouse Name: N/A  . Number of Children: N/A  . Years of Education: N/A   Occupational History  . Not on file.   Social History Main Topics  . Smoking status: Never Smoker   . Smokeless tobacco: Never Used  . Alcohol Use: No  . Drug Use: No  . Sexual Activity: Not on file   Other Topics Concern  . Not on file   Social History Narrative      Review of Systems  All other systems reviewed and are negative.      Objective:   Physical Exam  Constitutional: He appears well-developed and well-nourished.  HENT:  Right Ear: Tympanic membrane, external ear and ear canal normal.  Left Ear: External ear normal.  Nose: Nose normal. No mucosal edema or rhinorrhea. Right sinus exhibits no maxillary sinus tenderness and no frontal sinus tenderness. Left sinus exhibits no maxillary sinus tenderness and no frontal sinus tenderness.  Mouth/Throat: Oropharynx is clear and moist. No oropharyngeal exudate.  Eyes: Conjunctivae are normal.  Neck: Neck supple.  Cardiovascular: Normal rate, regular rhythm and normal heart sounds.   Pulmonary/Chest: Effort normal and breath sounds normal. No respiratory distress. He has no wheezes. He has no rales. He exhibits no tenderness.  Lymphadenopathy:    He has no cervical adenopathy.  Vitals  reviewed.         Assessment & Plan:  Viral bronchitis  I recommended tincture of time. Patient can use Mucinex DM to help as a mucolytic and expectorate. I anticipate gradual spontaneous resolution over the next 3 or 4 days. Recheck immediately if worse.

## 2014-12-20 ENCOUNTER — Ambulatory Visit: Payer: Self-pay | Admitting: Endocrinology

## 2014-12-26 ENCOUNTER — Other Ambulatory Visit: Payer: Medicaid Other

## 2014-12-26 DIAGNOSIS — I10 Essential (primary) hypertension: Secondary | ICD-10-CM

## 2014-12-26 DIAGNOSIS — E669 Obesity, unspecified: Secondary | ICD-10-CM

## 2014-12-26 DIAGNOSIS — E785 Hyperlipidemia, unspecified: Secondary | ICD-10-CM

## 2014-12-26 DIAGNOSIS — E1165 Type 2 diabetes mellitus with hyperglycemia: Secondary | ICD-10-CM

## 2014-12-26 DIAGNOSIS — Z0000 Encounter for general adult medical examination without abnormal findings: Secondary | ICD-10-CM

## 2014-12-26 DIAGNOSIS — Z79899 Other long term (current) drug therapy: Secondary | ICD-10-CM

## 2014-12-26 DIAGNOSIS — IMO0002 Reserved for concepts with insufficient information to code with codable children: Secondary | ICD-10-CM

## 2014-12-26 LAB — HEMOGLOBIN A1C
Hgb A1c MFr Bld: 6.4 % — ABNORMAL HIGH (ref <5.7)
Mean Plasma Glucose: 137 mg/dL — ABNORMAL HIGH (ref <117)

## 2014-12-26 LAB — COMPLETE METABOLIC PANEL WITH GFR
ALT: 10 U/L (ref 9–46)
AST: 13 U/L (ref 10–35)
Albumin: 3.5 g/dL — ABNORMAL LOW (ref 3.6–5.1)
Alkaline Phosphatase: 55 U/L (ref 40–115)
BILIRUBIN TOTAL: 0.3 mg/dL (ref 0.2–1.2)
BUN: 53 mg/dL — AB (ref 7–25)
CALCIUM: 9.4 mg/dL (ref 8.6–10.3)
CO2: 24 mEq/L (ref 20–31)
CREATININE: 1.6 mg/dL — AB (ref 0.70–1.33)
Chloride: 107 mEq/L (ref 98–110)
GFR, EST NON AFRICAN AMERICAN: 46 mL/min — AB (ref >=60)
GFR, Est African American: 54 mL/min — ABNORMAL LOW (ref >=60)
Glucose, Bld: 128 mg/dL — ABNORMAL HIGH (ref 70–99)
Potassium: 4.5 mEq/L (ref 3.5–5.3)
Sodium: 142 mEq/L (ref 135–146)
TOTAL PROTEIN: 6.4 g/dL (ref 6.1–8.1)

## 2014-12-26 LAB — LIPID PANEL
CHOLESTEROL: 191 mg/dL (ref 125–200)
HDL: 30 mg/dL — ABNORMAL LOW (ref >=40)
LDL CALC: 121 mg/dL (ref <130)
Total CHOL/HDL Ratio: 6.4 Ratio — ABNORMAL HIGH (ref <=5.0)
Triglycerides: 200 mg/dL — ABNORMAL HIGH (ref <150)
VLDL: 40 mg/dL — ABNORMAL HIGH (ref <30)

## 2014-12-26 LAB — CBC WITH DIFFERENTIAL/PLATELET
Basophils Absolute: 0.1 10*3/uL (ref 0.0–0.1)
Basophils Relative: 1 % (ref 0–1)
Eosinophils Absolute: 0.2 10*3/uL (ref 0.0–0.7)
Eosinophils Relative: 3 % (ref 0–5)
HCT: 33.3 % — ABNORMAL LOW (ref 39.0–52.0)
HEMOGLOBIN: 10.8 g/dL — AB (ref 13.0–17.0)
LYMPHS PCT: 27 % (ref 12–46)
Lymphs Abs: 2.1 10*3/uL (ref 0.7–4.0)
MCH: 26.1 pg (ref 26.0–34.0)
MCHC: 32.4 g/dL (ref 30.0–36.0)
MCV: 80.4 fL (ref 78.0–100.0)
MPV: 10.5 fL (ref 8.6–12.4)
Monocytes Absolute: 0.3 10*3/uL (ref 0.1–1.0)
Monocytes Relative: 4 % (ref 3–12)
NEUTROS ABS: 5.1 10*3/uL (ref 1.7–7.7)
Neutrophils Relative %: 65 % (ref 43–77)
Platelets: 242 10*3/uL (ref 150–400)
RBC: 4.14 MIL/uL — AB (ref 4.22–5.81)
RDW: 15.4 % (ref 11.5–15.5)
WBC: 7.8 10*3/uL (ref 4.0–10.5)

## 2014-12-27 ENCOUNTER — Other Ambulatory Visit: Payer: Medicaid Other

## 2014-12-27 LAB — TSH: TSH: 1.147 u[IU]/mL (ref 0.350–4.500)

## 2014-12-29 ENCOUNTER — Encounter: Payer: Medicaid Other | Admitting: Family Medicine

## 2015-01-09 ENCOUNTER — Ambulatory Visit (INDEPENDENT_AMBULATORY_CARE_PROVIDER_SITE_OTHER): Payer: Medicaid Other | Admitting: Family Medicine

## 2015-01-09 ENCOUNTER — Encounter: Payer: Self-pay | Admitting: Family Medicine

## 2015-01-09 VITALS — BP 100/62 | HR 64 | Temp 97.8°F | Resp 16 | Ht 68.0 in | Wt 272.0 lb

## 2015-01-09 DIAGNOSIS — Z23 Encounter for immunization: Secondary | ICD-10-CM | POA: Diagnosis not present

## 2015-01-09 DIAGNOSIS — Z0000 Encounter for general adult medical examination without abnormal findings: Secondary | ICD-10-CM

## 2015-01-09 NOTE — Progress Notes (Signed)
Subjective:    Patient ID: Alexander Hubbard, male    DOB: Apr 30, 1956, 59 y.o.   MRN: [id number]  HPI  patient is here today for complete physical exam. Patient has had Prevnar 13 and 2014. He is due today for Pneumovax 23. His tetanus shot is up-to-date. He is due for a flu shot but they are not available yet. Last colonoscopy was in the fall of 2015 and is not due again until 2025. He is due for prostate exam. The patient refuses a prostate exam but he will consent to a PSA. The remainder of his preventative care is up-to-date. His most recent lab work as listed below: Lab on 12/26/2014  Component Date Value Ref Range Status  . Sodium 12/26/2014 142  135 - 146 mEq/L Final  . Potassium 12/26/2014 4.5  3.5 - 5.3 mEq/L Final  . Chloride 12/26/2014 107  98 - 110 mEq/L Final  . CO2 12/26/2014 24  20 - 31 mEq/L Final  . Glucose, Bld 12/26/2014 128* 70 - 99 mg/dL Final  . BUN 12/26/2014 53* 7 - 25 mg/dL Final  . Creat 12/26/2014 1.60* 0.70 - 1.33 mg/dL Final  . Total Bilirubin 12/26/2014 0.3  0.2 - 1.2 mg/dL Final  . Alkaline Phosphatase 12/26/2014 55  40 - 115 U/L Final  . AST 12/26/2014 13  10 - 35 U/L Final  . ALT 12/26/2014 10  9 - 46 U/L Final  . Total Protein 12/26/2014 6.4  6.1 - 8.1 g/dL Final  . Albumin 12/26/2014 3.5* 3.6 - 5.1 g/dL Final  . Calcium 12/26/2014 9.4  8.6 - 10.3 mg/dL Final  . GFR, Est African American 12/26/2014 54* >=60 mL/min Final  . GFR, Est Non African American 12/26/2014 46* >=60 mL/min Final   Comment:   The estimated GFR is a calculation valid for adults (>=59 years old) that uses the CKD-EPI algorithm to adjust for age and sex. It is   not to be used for children, pregnant women, hospitalized patients,    patients on dialysis, or with rapidly changing kidney function. According to the NKDEP, eGFR >89 is normal, 60-89 shows mild impairment, 30-59 shows moderate impairment, 15-29 shows severe impairment and <15 is ESRD.     . TSH 12/26/2014 1.147  0.350 -  4.500 uIU/mL Final  . Cholesterol 12/26/2014 191  125 - 200 mg/dL Final   Comment: ATP III Classification:       < 200        mg/dL        Desirable      200 - 239     mg/dL        Borderline High      >= 240        mg/dL        High     . Triglycerides 12/26/2014 200* <150 mg/dL Final  . HDL 12/26/2014 30* >=40 mg/dL Final  . Total CHOL/HDL Ratio 12/26/2014 6.4* <=5.0 Ratio Final  . VLDL 12/26/2014 40* <30 mg/dL Final  . LDL Cholesterol 12/26/2014 121  <130 mg/dL Final   Comment:   Total Cholesterol/HDL Ratio:CHD Risk                        Coronary Heart Disease Risk Table  Men       Women          1/2 Average Risk              3.4        3.3              Average Risk              5.0        4.4           2X Average Risk              9.6        7.1           3X Average Risk             23.4       11.0 Use the calculated Patient Ratio above and the CHD Risk table  to determine the patient's CHD Risk. ATP III Classification (LDL):       < 100        mg/dL         Optimal      100 - 129     mg/dL         Near or Above Optimal      130 - 159     mg/dL         Borderline High      160 - 189     mg/dL         High       > 190        mg/dL         Very High     . WBC 12/26/2014 7.8  4.0 - 10.5 K/uL Final  . RBC 12/26/2014 4.14* 4.22 - 5.81 MIL/uL Final  . Hemoglobin 12/26/2014 10.8* 13.0 - 17.0 g/dL Final  . HCT 12/26/2014 33.3* 39.0 - 52.0 % Final  . MCV 12/26/2014 80.4  78.0 - 100.0 fL Final  . MCH 12/26/2014 26.1  26.0 - 34.0 pg Final  . MCHC 12/26/2014 32.4  30.0 - 36.0 g/dL Final  . RDW 12/26/2014 15.4  11.5 - 15.5 % Final  . Platelets 12/26/2014 242  150 - 400 K/uL Final  . MPV 12/26/2014 10.5  8.6 - 12.4 fL Final  . Neutrophils Relative % 12/26/2014 65  43 - 77 % Final  . Neutro Abs 12/26/2014 5.1  1.7 - 7.7 K/uL Final  . Lymphocytes Relative 12/26/2014 27  12 - 46 % Final  . Lymphs Abs 12/26/2014 2.1  0.7 - 4.0 K/uL Final  .  Monocytes Relative 12/26/2014 4  3 - 12 % Final  . Monocytes Absolute 12/26/2014 0.3  0.1 - 1.0 K/uL Final  . Eosinophils Relative 12/26/2014 3  0 - 5 % Final  . Eosinophils Absolute 12/26/2014 0.2  0.0 - 0.7 K/uL Final  . Basophils Relative 12/26/2014 1  0 - 1 % Final  . Basophils Absolute 12/26/2014 0.1  0.0 - 0.1 K/uL Final  . Smear Review 12/26/2014 Criteria for review not met   Final  . Hgb A1c MFr Bld 12/26/2014 6.4* <5.7 % Final   Comment:  According to the ADA Clinical Practice Recommendations for 2011, when HbA1c is used as a screening test:     >=6.5%   Diagnostic of Diabetes Mellitus            (if abnormal result is confirmed)   5.7-6.4%   Increased risk of developing Diabetes Mellitus   References:Diagnosis and Classification of Diabetes Mellitus,Diabetes VQQV,9563,87(FIEPP 1):S62-S69 and Standards of Medical Care in         Diabetes - 2011,Diabetes IRJJ,8841,66 (Suppl 1):S11-S61.     . Mean Plasma Glucose 12/26/2014 137* <117 mg/dL Final   Comment:   Footnotes:  (1) ** Please note change in unit of measure and reference range(s). **      Past Medical History  Diagnosis Date  . Diabetes mellitus   . Hypertension   . Obesity   . Sleep apnea   . Gouty arthritis   . Varicose veins    Past Surgical History  Procedure Laterality Date  . Eye surgery    . Knee surgery    . Gastric bypass     Current Outpatient Prescriptions on File Prior to Visit  Medication Sig Dispense Refill  . allopurinol (ZYLOPRIM) 100 MG tablet Take 2 tablets (200 mg total) by mouth daily. 60 tablet 6  . amLODipine (NORVASC) 5 MG tablet Take 1 tablet (5 mg total) by mouth daily. 30 tablet 3  . ammonium lactate (LAC-HYDRIN) 12 % lotion Apply 1 application topically 2 (two) times daily as needed (legs). 400 g 2  . aspirin EC 81 MG tablet Take 325 mg by mouth daily.     . brinzolamide (AZOPT) 1 % ophthalmic suspension  Place 1 drop into both eyes 2 (two) times daily.      . calcium carbonate (OS-CAL) 1250 (500 CA) MG chewable tablet Chew 1 tablet by mouth daily.    . carvedilol (COREG) 3.125 MG tablet Take 3.125 mg by mouth daily.     . colchicine 0.6 MG tablet Take 1 tablet (0.6 mg total) by mouth daily. Hold until follow-up with PCP 30 tablet 5  . CRESTOR 20 MG tablet TAKE 1 TABLET BY MOUTH DAILY 30 tablet 3  . diazepam (VALIUM) 2 MG tablet Take 1 tablet (2 mg total) by mouth every 12 (twelve) hours as needed for muscle spasms. 60 tablet 0  . diclofenac sodium (VOLTAREN) 1 % GEL Apply 2 g topically 4 (four) times daily. 100 g 1  . docusate sodium (COLACE) 100 MG capsule Take 100 mg by mouth 2 (two) times daily.    . ferrous sulfate 325 (65 FE) MG EC tablet Take 325 mg by mouth 3 (three) times daily with meals.    . gabapentin (NEURONTIN) 300 MG capsule Take 300 mg by mouth 2 (two) times daily.    Marland Kitchen glucose blood (ACCU-CHEK SMARTVIEW) test strip 1 each by Other route See admin instructions. Check blood sugar 3 times daily.    Marland Kitchen latanoprost (XALATAN) 0.005 % ophthalmic solution Place 1 drop into the left eye at bedtime.     . Linaclotide (LINZESS) 145 MCG CAPS capsule Take 1 capsule (145 mcg total) by mouth daily. 30 capsule 11  . lisinopril (PRINIVIL,ZESTRIL) 5 MG tablet Take 1 tablet (5 mg total) by mouth daily. 30 tablet 3  . losartan (COZAAR) 25 MG tablet Take 12.5 mg by mouth.     . metFORMIN (GLUCOPHAGE) 500 MG tablet Take 1 tablet (500 mg total) by mouth 2 (two) times daily with a meal. 60 tablet 3  .  methocarbamol (ROBAXIN) 750 MG tablet TAKE 1 TABLET (750 MG TOTAL) BY MOUTH 4 (FOUR) TIMES DAILY. 60 tablet 0  . Multiple Vitamin (MULTIVITAMIN) capsule Take 1 capsule by mouth 2 (two) times daily.    . NEOMYCIN-POLYMYXIN-HYDROCORTISONE (CORTISPORIN) 1 % SOLN otic solution PLACE 4 DROPS INTO BOTH EARS 4 (FOUR) TIMES DAILY. 10 mL 0  . OVER THE COUNTER MEDICATION Take 1 each by mouth 3 (three) times daily.  "Calcium Citrate Chewy Bite 500-500"    . OVER THE COUNTER MEDICATION Take 1 tablet by mouth at bedtime. "Strawberry Flavored Chewable Iron with Vitamin C 18 mg / 30 mg"    . oxyCODONE-acetaminophen (PERCOCET/ROXICET) 5-325 MG per tablet Take 1-2 tablets by mouth every 6 (six) hours as needed for severe pain. 30 tablet 0  . pantoprazole (PROTONIX) 40 MG tablet TAKE 1 TABLET BY MOUTH TWICE A DAY 60 tablet 11  . polyethylene glycol (MIRALAX / GLYCOLAX) packet Take 17 g by mouth at bedtime.    . prednisoLONE acetate (PRED FORTE) 1 % ophthalmic suspension Place 1 drop into the right eye at bedtime.     . sildenafil (VIAGRA) 100 MG tablet Take 1 tablet (100 mg total) by mouth daily as needed for erectile dysfunction. 6 tablet 12  . timolol (TIMOPTIC) 0.5 % ophthalmic solution Place 1 drop into both eyes 2 (two) times daily.    . traMADol (ULTRAM) 50 MG tablet Take 1 tablet (50 mg total) by mouth every 6 (six) hours as needed. 15 tablet 0  . UNABLE TO FIND Dispense one pair of Diabetic Shoes. ICD E11.49, G62.9, B35.1, R60.9 1 each 0  . vitamin B-12 (CYANOCOBALAMIN) 1000 MCG tablet Take 1,000 mcg by mouth. (Chewable tablet)     No current facility-administered medications on file prior to visit.   Allergies  Allergen Reactions  . Penicillins Swelling and Rash  . Amoxicillin Swelling and Rash  . Niacin Itching and Other (See Comments)    Other reaction(s): Flushing (ALLERGY/intolerance)  . Penicillin G Rash   History   Social History  . Marital Status: Married    Spouse Name: N/A  . Number of Children: N/A  . Years of Education: N/A   Occupational History  . Not on file.   Social History Main Topics  . Smoking status: Never Smoker   . Smokeless tobacco: Never Used  . Alcohol Use: No  . Drug Use: No  . Sexual Activity: Not on file   Other Topics Concern  . Not on file   Social History Narrative   Family History  Problem Relation Age of Onset  . Hypertension Sister   . Diabetes  Mellitus II Sister       Review of Systems  All other systems reviewed and are negative.      Objective:   Physical Exam  Constitutional: He is oriented to person, place, and time. He appears well-developed and well-nourished. No distress.  HENT:  Head: Normocephalic and atraumatic.  Right Ear: External ear normal.  Left Ear: External ear normal.  Nose: Nose normal.  Mouth/Throat: Oropharynx is clear and moist. No oropharyngeal exudate.  Eyes: Conjunctivae and EOM are normal. Pupils are equal, round, and reactive to light. Right eye exhibits no discharge. Left eye exhibits no discharge. No scleral icterus.  Neck: Normal range of motion. Neck supple. No JVD present. No tracheal deviation present. No thyromegaly present.  Cardiovascular: Normal rate, regular rhythm and intact distal pulses.  Exam reveals no gallop and no friction rub.  Murmur heard. Pulmonary/Chest: Effort normal and breath sounds normal. No stridor. No respiratory distress. He has no wheezes. He has no rales. He exhibits no tenderness.  Abdominal: Soft. Bowel sounds are normal. He exhibits no distension and no mass. There is no tenderness. There is no rebound and no guarding.  Musculoskeletal: Normal range of motion. He exhibits edema. He exhibits no tenderness.  Lymphadenopathy:    He has no cervical adenopathy.  Neurological: He is alert and oriented to person, place, and time. He has normal reflexes. He displays normal reflexes. No cranial nerve deficit. He exhibits normal muscle tone. Coordination normal.  Skin: Rash noted. He is not diaphoretic. No pallor.  Psychiatric: He has a normal mood and affect. His behavior is normal. Judgment and thought content normal.  Vitals reviewed.     Numerous varicosities and chronic venous stasis dermatitis on both shins     Assessment & Plan:  Routine general medical examination at a health care facility - Plan: PSA, Pneumococcal polysaccharide vaccine 23-valent greater  than or equal to 2yo subcutaneous/IM  Need for prophylactic vaccination against Streptococcus pneumoniae (pneumococcus) - Plan: Pneumococcal polysaccharide vaccine 23-valent greater than or equal to 2yo subcutaneous/IM   patient's HDL cholesterol is very low. Hemoglobin A1c is excellent. Blood pressure is excellent. Colonoscopy is up-to-date. I will check a PSA today. I have recommended increasing aerobic exercise to 30 minutes a day 5 days a week. I recommended compliance with his Crestor to address his elevated LDL cholesterol. I believe these 2 things will help lower his LDL cholesterol and raise his HDL cholesterol. The patient is only been taking his Crestor 2-3 times a week. Patient is anemic. Hemoglobin has dropped 2 points since March. However the patient has stopped taking his iron supplement after his gout gastric bypass. I recommended he resume his gastric bypass and then allow [place] to recheck the blood work such as a CBC, and hemoglobin A1c, and a fasting lipid panel in 3 months.

## 2015-01-10 ENCOUNTER — Encounter: Payer: Self-pay | Admitting: Family Medicine

## 2015-01-10 LAB — PSA: PSA: 0.32 ng/mL (ref <=4.00)

## 2015-01-12 ENCOUNTER — Encounter: Payer: Self-pay | Admitting: Podiatry

## 2015-01-12 ENCOUNTER — Ambulatory Visit (INDEPENDENT_AMBULATORY_CARE_PROVIDER_SITE_OTHER): Payer: Medicaid Other | Admitting: Podiatry

## 2015-01-12 DIAGNOSIS — B351 Tinea unguium: Secondary | ICD-10-CM

## 2015-01-12 DIAGNOSIS — M79676 Pain in unspecified toe(s): Secondary | ICD-10-CM | POA: Diagnosis not present

## 2015-01-18 NOTE — Progress Notes (Signed)
Patient ID: Alexander Hubbard, male   DOB: [birth year], 59 y.o.   MRN: [id number]  Subjective: 59 y.o. returns the office today for painful, elongated, thickened toenails which she is unable to trim herself. Denies any redness or drainage around the nails. Denies any acute changes since last appointment and no new complaints today. Denies any systemic complaints such as fevers, chills, nausea, vomiting. Last blood sugar was 111.   Objective: AAO 3, NAD DP/PT pulses palpable, CRT less than 3 seconds Protective sensation decreased with Simms Weinstein monofilament Nails hypertrophic, dystrophic, elongated, brittle, discolored 10. There is tenderness overlying the nails 1-5 bilaterally. There is no surrounding erythema or drainage along the nail sites. No open lesions or pre-ulcerative lesions are identified. No other areas of tenderness bilateral lower extremities. No overlying edema, erythema, increased warmth. No pain with calf compression, swelling, warmth, erythema.  Assessment: Patient presents with symptomatic onychomycosis  Plan: -Treatment options including alternatives, risks, complications were discussed -Nails sharply debrided 10 without complication/bleeding. -Discussed daily foot inspection. If there are any changes, to call the office immediately.  -Follow-up in 3 months or sooner if any problems are to arise. In the meantime, encouraged to call the office with any questions, concerns, changes symptoms.  Ovid Curd, DPM

## 2015-01-23 ENCOUNTER — Telehealth: Payer: Self-pay | Admitting: Family Medicine

## 2015-01-23 NOTE — Telephone Encounter (Signed)
Be glad to see first to see if necessary.

## 2015-01-23 NOTE — Telephone Encounter (Signed)
Patient would like referral to a dermatologist if possible  For a growth on his head [phone]

## 2015-01-23 NOTE — Telephone Encounter (Signed)
OK to do referral

## 2015-01-24 NOTE — Telephone Encounter (Signed)
Pt aware needs to come in to have provider to evaluate before proceeding to Dermatology, coming in on Monday

## 2015-01-29 ENCOUNTER — Encounter: Payer: Self-pay | Admitting: Family Medicine

## 2015-01-29 ENCOUNTER — Ambulatory Visit (INDEPENDENT_AMBULATORY_CARE_PROVIDER_SITE_OTHER): Payer: Medicaid Other | Admitting: Family Medicine

## 2015-01-29 VITALS — BP 118/68 | HR 64 | Temp 98.5°F | Resp 18 | Ht 68.0 in | Wt 270.0 lb

## 2015-01-29 DIAGNOSIS — M7711 Lateral epicondylitis, right elbow: Secondary | ICD-10-CM

## 2015-01-29 DIAGNOSIS — L821 Other seborrheic keratosis: Secondary | ICD-10-CM

## 2015-01-29 NOTE — Progress Notes (Signed)
Subjective:    Patient ID: Alexander Hubbard, male    DOB: 03/07/1956, 59 y.o.   MRN: [id number]  HPI  Patient has a warty lesion on his forehead. It is 6 mm in size. It is a light brown papule verruca form in nature similar to a seborrheic keratosis.  He also reports pain in his right elbow over the lateral epicondyles. He is tender to palpation in this area. It is worse with resisted wrist dorsiflexion as well as hand gripping exercises. Past Medical History  Diagnosis Date  . Diabetes mellitus   . Hypertension   . Obesity   . Sleep apnea   . Gouty arthritis   . Varicose veins    Past Surgical History  Procedure Laterality Date  . Eye surgery    . Knee surgery    . Gastric bypass     Current Outpatient Prescriptions on File Prior to Visit  Medication Sig Dispense Refill  . allopurinol (ZYLOPRIM) 100 MG tablet Take 2 tablets (200 mg total) by mouth daily. 60 tablet 6  . amLODipine (NORVASC) 5 MG tablet Take 1 tablet (5 mg total) by mouth daily. 30 tablet 3  . ammonium lactate (LAC-HYDRIN) 12 % lotion Apply 1 application topically 2 (two) times daily as needed (legs). 400 g 2  . aspirin EC 81 MG tablet Take 325 mg by mouth daily.     . brinzolamide (AZOPT) 1 % ophthalmic suspension Place 1 drop into both eyes 2 (two) times daily.      . calcium carbonate (OS-CAL) 1250 (500 CA) MG chewable tablet Chew 1 tablet by mouth daily.    . carvedilol (COREG) 3.125 MG tablet Take 3.125 mg by mouth daily.     . colchicine 0.6 MG tablet Take 1 tablet (0.6 mg total) by mouth daily. Hold until follow-up with PCP 30 tablet 5  . CRESTOR 20 MG tablet TAKE 1 TABLET BY MOUTH DAILY 30 tablet 3  . diazepam (VALIUM) 2 MG tablet Take 1 tablet (2 mg total) by mouth every 12 (twelve) hours as needed for muscle spasms. 60 tablet 0  . diclofenac sodium (VOLTAREN) 1 % GEL Apply 2 g topically 4 (four) times daily. 100 g 1  . docusate sodium (COLACE) 100 MG capsule Take 100 mg by mouth 2 (two) times daily.    .  ferrous sulfate 325 (65 FE) MG EC tablet Take 325 mg by mouth 3 (three) times daily with meals.    . gabapentin (NEURONTIN) 300 MG capsule Take 300 mg by mouth 2 (two) times daily.    Marland Kitchen glucose blood (ACCU-CHEK SMARTVIEW) test strip 1 each by Other route See admin instructions. Check blood sugar 3 times daily.    Marland Kitchen latanoprost (XALATAN) 0.005 % ophthalmic solution Place 1 drop into the left eye at bedtime.     . Linaclotide (LINZESS) 145 MCG CAPS capsule Take 1 capsule (145 mcg total) by mouth daily. 30 capsule 11  . lisinopril (PRINIVIL,ZESTRIL) 5 MG tablet Take 1 tablet (5 mg total) by mouth daily. 30 tablet 3  . losartan (COZAAR) 25 MG tablet Take 12.5 mg by mouth.     . metFORMIN (GLUCOPHAGE) 500 MG tablet Take 1 tablet (500 mg total) by mouth 2 (two) times daily with a meal. 60 tablet 3  . methocarbamol (ROBAXIN) 750 MG tablet TAKE 1 TABLET (750 MG TOTAL) BY MOUTH 4 (FOUR) TIMES DAILY. 60 tablet 0  . Multiple Vitamin (MULTIVITAMIN) capsule Take 1 capsule by mouth 2 (two) times  daily.    . NEOMYCIN-POLYMYXIN-HYDROCORTISONE (CORTISPORIN) 1 % SOLN otic solution PLACE 4 DROPS INTO BOTH EARS 4 (FOUR) TIMES DAILY. 10 mL 0  . OVER THE COUNTER MEDICATION Take 1 each by mouth 3 (three) times daily. "Calcium Citrate Chewy Bite 500-500"    . OVER THE COUNTER MEDICATION Take 1 tablet by mouth at bedtime. "Strawberry Flavored Chewable Iron with Vitamin C 18 mg / 30 mg"    . oxyCODONE-acetaminophen (PERCOCET/ROXICET) 5-325 MG per tablet Take 1-2 tablets by mouth every 6 (six) hours as needed for severe pain. 30 tablet 0  . pantoprazole (PROTONIX) 40 MG tablet TAKE 1 TABLET BY MOUTH TWICE A DAY 60 tablet 11  . polyethylene glycol (MIRALAX / GLYCOLAX) packet Take 17 g by mouth at bedtime.    . prednisoLONE acetate (PRED FORTE) 1 % ophthalmic suspension Place 1 drop into the right eye at bedtime.     . sildenafil (VIAGRA) 100 MG tablet Take 1 tablet (100 mg total) by mouth daily as needed for erectile  dysfunction. 6 tablet 12  . timolol (TIMOPTIC) 0.5 % ophthalmic solution Place 1 drop into both eyes 2 (two) times daily.    . traMADol (ULTRAM) 50 MG tablet Take 1 tablet (50 mg total) by mouth every 6 (six) hours as needed. 15 tablet 0  . UNABLE TO FIND Dispense one pair of Diabetic Shoes. ICD E11.49, G62.9, B35.1, R60.9 1 each 0  . vitamin B-12 (CYANOCOBALAMIN) 1000 MCG tablet Take 1,000 mcg by mouth. (Chewable tablet)     No current facility-administered medications on file prior to visit.   Allergies  Allergen Reactions  . Penicillins Swelling and Rash  . Amoxicillin Swelling and Rash  . Niacin Itching and Other (See Comments)    Other reaction(s): Flushing (ALLERGY/intolerance)  . Penicillin G Rash   Social History   Social History  . Marital Status: Married    Spouse Name: N/A  . Number of Children: N/A  . Years of Education: N/A   Occupational History  . Not on file.   Social History Main Topics  . Smoking status: Never Smoker   . Smokeless tobacco: Never Used  . Alcohol Use: No  . Drug Use: No  . Sexual Activity: Not on file   Other Topics Concern  . Not on file   Social History Narrative      Review of Systems  All other systems reviewed and are negative.      Objective:   Physical Exam  Cardiovascular: Normal rate, regular rhythm and normal heart sounds.   Pulmonary/Chest: Effort normal and breath sounds normal.  Musculoskeletal:       Right elbow: He exhibits normal range of motion, no swelling, no effusion and no deformity. Tenderness found. Lateral epicondyle tenderness noted.  Vitals reviewed.         Assessment & Plan:  Seborrheic keratosis  Tennis elbow, right  the seborrheic keratosis was treated with liquid nitrogen cryotherapy for a total of 30 seconds. I explained to the patient that this is a benign lesion but he requested that I treated to try to have it removed. If lesion persists consider a shave biopsy. Patient has mild  tenderness elbow. I recommended an elbow strap brace. He can wear that constantly for the next 2-3 weeks and I anticipate gradual improvement. If pain persists consider a cortisone injection in that area. He can also ice his elbow every afternoon to help calm the inflammation

## 2015-01-31 ENCOUNTER — Ambulatory Visit (INDEPENDENT_AMBULATORY_CARE_PROVIDER_SITE_OTHER): Payer: Medicaid Other | Admitting: Cardiology

## 2015-01-31 ENCOUNTER — Encounter: Payer: Self-pay | Admitting: Cardiology

## 2015-01-31 VITALS — BP 136/74 | HR 62 | Ht 68.0 in | Wt 268.0 lb

## 2015-01-31 DIAGNOSIS — E785 Hyperlipidemia, unspecified: Secondary | ICD-10-CM

## 2015-01-31 DIAGNOSIS — I10 Essential (primary) hypertension: Secondary | ICD-10-CM

## 2015-01-31 MED ORDER — ASPIRIN 81 MG PO TABS: 81.0000 mg | ORAL_TABLET | ORAL | Status: DC | PRN

## 2015-01-31 NOTE — Progress Notes (Signed)
[address], [address] [place], [place]  [address] Phone: [phone] Fax:  [phone]  Date:  01/31/2015   ID:  Alexander Hubbard, DOB [birth year], MRN [id number]  PCP:  Leo Grosser, MD   History of Present Illness: Alexander Hubbard is a 59 y.o. male here for follow up post weight reduction surgery. Has lost significant amount of weight. He feels much better. Had prior issues with emesis. Endoscopy performed.  Ulcer has healed.  He has a history of morbid obesity, hypertension, diabetes, chronic kidney disease, chronic diastolic heart failure, noncompliant obstructive sleep apnea CPAP.  EF probably 45%. NUC stress 4/13 low risk (reduced sensitivity due to attenuation artifact).  Currently he feels at baseline. Throbbing in chest pain. . No significant shortness of breath, in fact he feels better from a breathing perspective as he has in a long time.    Wt Readings from Last 3 Encounters:  01/31/15 268 lb (121.564 kg)  01/29/15 270 lb (122.471 kg)  01/09/15 272 lb (123.378 kg)     Past Medical History  Diagnosis Date  . Diabetes mellitus   . Hypertension   . Obesity   . Sleep apnea   . Gouty arthritis   . Varicose veins     Past Surgical History  Procedure Laterality Date  . Eye surgery    . Knee surgery    . Gastric bypass      Current Outpatient Prescriptions  Medication Sig Dispense Refill  . amLODipine (NORVASC) 5 MG tablet Take 1 tablet (5 mg total) by mouth daily. 30 tablet 3  . ammonium lactate (LAC-HYDRIN) 12 % lotion Apply 1 application topically 2 (two) times daily as needed (legs). 400 g 2  . aspirin 325 MG tablet Take 325 mg by mouth daily.    . brinzolamide (AZOPT) 1 % ophthalmic suspension Place 1 drop into both eyes 2 (two) times daily.      . carvedilol (COREG) 3.125 MG tablet Take 3.125 mg by mouth daily.     . CRESTOR 20 MG tablet TAKE 1 TABLET BY MOUTH DAILY 30 tablet 3  . diazepam (VALIUM) 2 MG tablet Take 1 tablet (2 mg total) by  mouth every 12 (twelve) hours as needed for muscle spasms. 60 tablet 0  . docusate sodium (COLACE) 100 MG capsule Take 100 mg by mouth 2 (two) times daily.    . ferrous sulfate 325 (65 FE) MG tablet Take 325 mg by mouth daily with breakfast.    . gabapentin (NEURONTIN) 300 MG capsule Take 300 mg by mouth 2 (two) times daily.    Marland Kitchen glucose blood (ACCU-CHEK SMARTVIEW) test strip 1 each by Other route See admin instructions. Check blood sugar 3 times daily.    Marland Kitchen latanoprost (XALATAN) 0.005 % ophthalmic solution Place 1 drop into the left eye at bedtime.     . Linaclotide (LINZESS) 145 MCG CAPS capsule Take 1 capsule (145 mcg total) by mouth daily. 30 capsule 11  . lisinopril (PRINIVIL,ZESTRIL) 5 MG tablet Take 1 tablet (5 mg total) by mouth daily. 30 tablet 3  . losartan (COZAAR) 25 MG tablet Take 12.5 mg by mouth.     . metFORMIN (GLUCOPHAGE) 500 MG tablet Take 1 tablet (500 mg total) by mouth 2 (two) times daily with a meal. 60 tablet 3  . Multiple Vitamin (MULTIVITAMIN) capsule Take 1 capsule by mouth 2 (two) times daily.    Marland Kitchen OVER THE COUNTER MEDICATION Take 1 each by mouth 3 (  three) times daily. "Calcium Citrate Chewy Bite 500-500"    . pantoprazole (PROTONIX) 40 MG tablet TAKE 1 TABLET BY MOUTH TWICE A DAY 60 tablet 11  . prednisoLONE acetate (PRED FORTE) 1 % ophthalmic suspension Place 1 drop into the right eye at bedtime.     . timolol (TIMOPTIC) 0.5 % ophthalmic solution Place 1 drop into both eyes 2 (two) times daily.    . traMADol (ULTRAM) 50 MG tablet Take 1 tablet (50 mg total) by mouth every 6 (six) hours as needed. 15 tablet 0  . ULORIC 40 MG tablet Take 40 mg by mouth daily.  5  . UNABLE TO FIND Dispense one pair of Diabetic Shoes. ICD E11.49, G62.9, B35.1, R60.9 1 each 0  . vitamin B-12 (CYANOCOBALAMIN) 1000 MCG tablet Take 1,000 mcg by mouth. (Chewable tablet)    . polyethylene glycol (MIRALAX / GLYCOLAX) packet Take 17 g by mouth at bedtime.     No current facility-administered  medications for this visit.    Allergies:    Allergies  Allergen Reactions  . Penicillins Swelling and Rash  . Amoxicillin Swelling and Rash  . Niacin Itching and Other (See Comments)    Other reaction(s): Flushing (ALLERGY/intolerance)  . Penicillin G Rash    Social History:  The patient  reports that he has never smoked. He has never used smokeless tobacco. He reports that he does not drink alcohol or use illicit drugs.   ROS:  Please see the history of present illness.    Denies any chest discomfort, no significant increase in shortness of breath, positive for isolated syncopal episode as described above, no rashes, no fevers, no cough.    PHYSICAL EXAM: VS:  BP 136/74 mmHg  Pulse 62  Ht 5\' 8"  (1.727 m)  Wt 268 lb (121.564 kg)  BMI 40.76 kg/m2  SpO2 98% Well nourished, well developed, in no acute distress HEENT: normal Neck: no JVD,  Cardiac:  normal S1, S2; RRR; soft systolic murmurRight upper sternal border, distant heart sounds due to to body habitus Lungs:  clear to auscultation bilaterally, no wheezing, rhonchi or rales Abd: soft, nontender, no hepatomegalyMorbid obesity improved Ext: no edemaChronic venous stasis changes however edema is much improved Skin: warm and dry Neuro: no focal abnormalities noted  EKG:    04/14/14-normal sinus rhythm, 66, right bundle branch block.  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 Weight loss-significant weight loss post gastric surgery. RBBB no change. 2. Morbid obesity- 268 from 408. Wonderful.  No symptoms. No anginal symptoms. (Transient throbbing and chest) Doing well. Continue to encourage exercise. Decrease aspirin to 81 mg. 3. Hypertension-currently controlled. 4. Hyperlipidemia-he is no longer taking Crestor but he does not know which formulation of statin he is taking. 5. Diabetes-per primary team. No longer on insulin 6. Obstructive sleep apnea-encourage use  of CPAP. 7. Chronic kidney disease stage III-he is seeing nephrology. 8. Erectile dysfunction-I'm fine with him utilizing Viagra 100 mg. however cost was over $200. Avoid nitrates. 9. Venous insufficiency-reviewed Dr. Bosie Helper note ablation of saphenous vein discussed. 10. We will see back in 6 months.  Signed, Donato Schultz, MD [facility]  01/31/2015 9:57 AM

## 2015-01-31 NOTE — Patient Instructions (Signed)
Medication Instructions:  Please decrease your ASA to 81 mg a day. Continue all other medications as listed.  Follow-Up: Follow up in 6 months with Dr. Skains.  You will receive a letter in the mail 2 months before you are due.  Please call us when you receive this letter to schedule your follow up appointment.  Thank you for choosing [facility]!!      

## 2015-02-09 ENCOUNTER — Encounter: Payer: Self-pay | Admitting: Family Medicine

## 2015-02-09 ENCOUNTER — Ambulatory Visit (INDEPENDENT_AMBULATORY_CARE_PROVIDER_SITE_OTHER): Payer: Medicaid Other | Admitting: Family Medicine

## 2015-02-09 VITALS — BP 132/68 | HR 56 | Temp 98.1°F | Resp 18 | Ht 68.0 in | Wt 270.0 lb

## 2015-02-09 DIAGNOSIS — J208 Acute bronchitis due to other specified organisms: Secondary | ICD-10-CM

## 2015-02-09 MED ORDER — AZITHROMYCIN 250 MG PO TABS: ORAL_TABLET | ORAL | Status: DC

## 2015-02-09 NOTE — Progress Notes (Signed)
Subjective:    Patient ID: Alexander Hubbard, male    DOB: 04-21-1956, 59 y.o.   MRN: [id number]  HPI  Patient's symptoms developed 2 days ago. He developed a fever to 101. He developed a cough productive of green and purulent mucus. He developed shortness of breath. He also developed left posterior pleurisy. He has moderate pain when he takes a deep breath in on his left side.  He also states that as he has become ill, his sugars are starting to go up and his blood pressure started to drop. Past Medical History  Diagnosis Date  . Diabetes mellitus   . Hypertension   . Obesity   . Sleep apnea   . Gouty arthritis   . Varicose veins    Past Surgical History  Procedure Laterality Date  . Eye surgery    . Knee surgery    . Gastric bypass     Current Outpatient Prescriptions on File Prior to Visit  Medication Sig Dispense Refill  . amLODipine (NORVASC) 5 MG tablet Take 1 tablet (5 mg total) by mouth daily. 30 tablet 3  . ammonium lactate (LAC-HYDRIN) 12 % lotion Apply 1 application topically 2 (two) times daily as needed (legs). 400 g 2  . aspirin 81 MG tablet Take 1 tablet (81 mg total) by mouth every 4 (four) hours as needed.    . brinzolamide (AZOPT) 1 % ophthalmic suspension Place 1 drop into both eyes 2 (two) times daily.      . carvedilol (COREG) 3.125 MG tablet Take 3.125 mg by mouth daily.     . CRESTOR 20 MG tablet TAKE 1 TABLET BY MOUTH DAILY 30 tablet 3  . diazepam (VALIUM) 2 MG tablet Take 1 tablet (2 mg total) by mouth every 12 (twelve) hours as needed for muscle spasms. 60 tablet 0  . docusate sodium (COLACE) 100 MG capsule Take 100 mg by mouth 2 (two) times daily.    . ferrous sulfate 325 (65 FE) MG tablet Take 325 mg by mouth daily with breakfast.    . gabapentin (NEURONTIN) 300 MG capsule Take 300 mg by mouth 2 (two) times daily.    Marland Kitchen glucose blood (ACCU-CHEK SMARTVIEW) test strip 1 each by Other route See admin instructions. Check blood sugar 3 times daily.    Marland Kitchen  latanoprost (XALATAN) 0.005 % ophthalmic solution Place 1 drop into the left eye at bedtime.     . Linaclotide (LINZESS) 145 MCG CAPS capsule Take 1 capsule (145 mcg total) by mouth daily. 30 capsule 11  . lisinopril (PRINIVIL,ZESTRIL) 5 MG tablet Take 1 tablet (5 mg total) by mouth daily. 30 tablet 3  . losartan (COZAAR) 25 MG tablet Take 12.5 mg by mouth.     . metFORMIN (GLUCOPHAGE) 500 MG tablet Take 1 tablet (500 mg total) by mouth 2 (two) times daily with a meal. 60 tablet 3  . Multiple Vitamin (MULTIVITAMIN) capsule Take 1 capsule by mouth 2 (two) times daily.    Marland Kitchen OVER THE COUNTER MEDICATION Take 1 each by mouth 3 (three) times daily. "Calcium Citrate Chewy Bite 500-500"    . pantoprazole (PROTONIX) 40 MG tablet TAKE 1 TABLET BY MOUTH TWICE A DAY 60 tablet 11  . polyethylene glycol (MIRALAX / GLYCOLAX) packet Take 17 g by mouth at bedtime.    . prednisoLONE acetate (PRED FORTE) 1 % ophthalmic suspension Place 1 drop into the right eye at bedtime.     . timolol (TIMOPTIC) 0.5 % ophthalmic solution Place  1 drop into both eyes 2 (two) times daily.    . traMADol (ULTRAM) 50 MG tablet Take 1 tablet (50 mg total) by mouth every 6 (six) hours as needed. 15 tablet 0  . ULORIC 40 MG tablet Take 40 mg by mouth daily.  5  . UNABLE TO FIND Dispense one pair of Diabetic Shoes. ICD E11.49, G62.9, B35.1, R60.9 1 each 0  . vitamin B-12 (CYANOCOBALAMIN) 1000 MCG tablet Take 1,000 mcg by mouth. (Chewable tablet)     No current facility-administered medications on file prior to visit.   Allergies  Allergen Reactions  . Penicillins Swelling and Rash  . Amoxicillin Swelling and Rash  . Niacin Itching and Other (See Comments)    Other reaction(s): Flushing (ALLERGY/intolerance)  . Penicillin G Rash   Social History   Social History  . Marital Status: Married    Spouse Name: N/A  . Number of Children: N/A  . Years of Education: N/A   Occupational History  . Not on file.   Social History Main  Topics  . Smoking status: Never Smoker   . Smokeless tobacco: Never Used  . Alcohol Use: No  . Drug Use: No  . Sexual Activity: Not on file   Other Topics Concern  . Not on file   Social History Narrative     Review of Systems  All other systems reviewed and are negative.      Objective:   Physical Exam  Neck: Neck supple.  Cardiovascular: Normal rate, regular rhythm and normal heart sounds.   Pulmonary/Chest: Effort normal and breath sounds normal. No respiratory distress. He has no wheezes. He has no rales. He exhibits tenderness.  Abdominal: Soft. Bowel sounds are normal.  Lymphadenopathy:    He has no cervical adenopathy.  Vitals reviewed.         Assessment & Plan:  Acute bronchitis due to other specified organisms - Plan: azithromycin (ZITHROMAX) 250 MG tablet  I'm concerned the patient is developing bacterial bronchitis or even walking pneumonia. Begin azithromycin in the form of a Z-Pak. Temporarily discontinue amlodipine until he is feeling better. Recheck Monday or sooner if worse

## 2015-02-16 ENCOUNTER — Other Ambulatory Visit: Payer: Self-pay | Admitting: Family Medicine

## 2015-02-16 ENCOUNTER — Telehealth: Payer: Self-pay | Admitting: Family Medicine

## 2015-02-16 MED ORDER — GUAIFENESIN-CODEINE 100-10 MG/5ML PO SYRP: 5.0000 mL | ORAL_SOLUTION | Freq: Four times a day (QID) | ORAL | Status: DC

## 2015-02-16 NOTE — Telephone Encounter (Signed)
Patient is still coughing and says he needs more cough medicine  [phone]

## 2015-02-16 NOTE — Telephone Encounter (Signed)
Pt aware of med called out via vm & med called to [facility]

## 2015-02-16 NOTE — Telephone Encounter (Signed)
Zpak denied, Crestor refilled

## 2015-02-16 NOTE — Telephone Encounter (Signed)
Had z pack.  Use robitussin with codeine 1 tsp po q 6 hrs prn cough.

## 2015-02-26 ENCOUNTER — Encounter: Payer: Self-pay | Admitting: Vascular Surgery

## 2015-02-27 ENCOUNTER — Ambulatory Visit: Payer: Self-pay | Admitting: Vascular Surgery

## 2015-02-27 ENCOUNTER — Encounter (HOSPITAL_COMMUNITY): Payer: Self-pay

## 2015-03-13 ENCOUNTER — Other Ambulatory Visit: Payer: Self-pay | Admitting: Family Medicine

## 2015-03-13 NOTE — Telephone Encounter (Signed)
Refill appropriate and filled per protocol. 

## 2015-03-22 ENCOUNTER — Encounter: Payer: Self-pay | Admitting: Optometry

## 2015-03-23 ENCOUNTER — Ambulatory Visit: Payer: Medicaid Other | Admitting: Podiatry

## 2015-04-05 ENCOUNTER — Ambulatory Visit: Payer: Medicaid Other | Admitting: Family Medicine

## 2015-04-10 ENCOUNTER — Other Ambulatory Visit: Payer: Medicaid Other

## 2015-04-12 ENCOUNTER — Ambulatory Visit: Payer: Medicaid Other | Admitting: Family Medicine

## 2015-04-17 ENCOUNTER — Encounter: Payer: Self-pay | Admitting: Podiatry

## 2015-04-17 ENCOUNTER — Ambulatory Visit (INDEPENDENT_AMBULATORY_CARE_PROVIDER_SITE_OTHER): Payer: Medicaid Other | Admitting: Podiatry

## 2015-04-17 DIAGNOSIS — M79676 Pain in unspecified toe(s): Secondary | ICD-10-CM

## 2015-04-17 DIAGNOSIS — B351 Tinea unguium: Secondary | ICD-10-CM | POA: Diagnosis not present

## 2015-04-17 DIAGNOSIS — M2580 Other specified joint disorders, unspecified joint: Secondary | ICD-10-CM

## 2015-04-17 DIAGNOSIS — M2020 Hallux rigidus, unspecified foot: Secondary | ICD-10-CM

## 2015-04-17 DIAGNOSIS — M205X9 Other deformities of toe(s) (acquired), unspecified foot: Secondary | ICD-10-CM

## 2015-04-17 NOTE — Progress Notes (Signed)
Patient ID: Alexander BouquetRiyad Givans, male   DOB: July 11, 1955, 59 y.o.   MRN: [id number] Complaint:  Visit Type: Patient returns to my office for continued preventative foot care services. Complaint: Patient states" my nails have grown long and thick and become painful to walk and wear shoes" Patient has been diagnosed with DM with neuropathy . He presents for preventative foot care services. No changes to ROS. He also says he has pain under the big toe joints both feet.  He says the pain is related to his activity level and presents for evaluation of his feet.  Podiatric Exam: Vascular: dorsalis pedis and posterior tibial pulses are palpable bilateral. Capillary return is immediate. Temperature gradient is WNL. Skin turgor WNL  Sensorium: Diminished  Semmes Weinstein monofilament test. Normal tactile sensation bilaterally. Nail Exam: Pt has thick disfigured discolored nails with subungual debris noted bilateral entire nail hallux through fifth toenails Ulcer Exam: There is no evidence of ulcer or pre-ulcerative changes or infection. Orthopedic Exam: Muscle tone and strength are WNL. No limitations in general ROM. No crepitus or effusions noted. Foot type and digits show no abnormalities. Bony prominences are unremarkable. There is hallux limitus deformity 1st MPJ B/L.  There is pain noted under the sesamoid apparatus with no signs of redness or swelling noted.   Skin: No Porokeratosis. No infection or ulcers  Diagnosis:  Onychomycosis, Pain in right toe, pain in left toes,  Sesamoiditis B/L  Treatment & Plan Procedures and Treatment: Consent by patient was obtained for treatment procedures. The patient understood the discussion of treatment and procedures well. All questions were answered thoroughly reviewed. Debridement of mycotic and hypertrophic toenails, 1 through 5 bilateral and clearing of subungual debris. No ulceration, no infection noted. Dispersion padding added to his insole in his shoes.  Upon leaving  he is pain free. Return Visit-Office Procedure: Patient instructed to return to the office for a follow up visit 3 months for continued evaluation and treatment.

## 2015-05-17 ENCOUNTER — Other Ambulatory Visit: Payer: Self-pay | Admitting: Family Medicine

## 2015-05-22 ENCOUNTER — Other Ambulatory Visit: Payer: Self-pay | Admitting: Family Medicine

## 2015-06-29 ENCOUNTER — Encounter: Payer: Self-pay | Admitting: Family Medicine

## 2015-06-29 ENCOUNTER — Ambulatory Visit (INDEPENDENT_AMBULATORY_CARE_PROVIDER_SITE_OTHER): Payer: Medicaid Other | Admitting: Family Medicine

## 2015-06-29 ENCOUNTER — Ambulatory Visit: Payer: Medicaid Other | Admitting: Family Medicine

## 2015-06-29 VITALS — BP 110/60 | HR 60 | Temp 97.8°F | Resp 16 | Ht 68.0 in | Wt 268.0 lb

## 2015-06-29 DIAGNOSIS — R42 Dizziness and giddiness: Secondary | ICD-10-CM | POA: Diagnosis not present

## 2015-06-29 DIAGNOSIS — M545 Low back pain, unspecified: Secondary | ICD-10-CM

## 2015-06-29 DIAGNOSIS — M25511 Pain in right shoulder: Secondary | ICD-10-CM

## 2015-06-29 NOTE — Progress Notes (Signed)
Subjective:    Patient ID: Alexander Hubbard, male    DOB: [birth year], 60 y.o.   MRN: [id number]  HPI   the patient reports pain in his right shoulder. Pain is worse with abduction greater than 90. He has a positive Hawkins sign. He has a positive empty can sign. Pain began gradually 1 week ago. His shoulder now aches and throbs. He denies any falls or injury. He also complains of pain in his lower back in the lumbar paraspinal muscles. It hurts to stand and walk. It hurts to bend over. He denies any sciatica. He denies any symptoms of cauda equina syndrome. He has normal strength in his legs and normal reflexes. He has a negative straight leg raise. He also has a small superficial skin tear on his left shin. He has chronic venous stasis changes on the left shin and pitting edema in the left shin. He is not wearing his compression hose as directed. He also reports occasional dizziness. Recently his kidney doctor changed his blood pressure medication. They decreased his carvedilol dose due to orthostatic dizziness and just started amlodipine this morning. I have asked the patient to give the medication changes a chance to take effect. He denies any vertigo. He denies any tinnitus. He denies any hearing loss. He denies any headaches or vision changes Past Medical History  Diagnosis Date  . Diabetes mellitus   . Hypertension   . Obesity   . Sleep apnea   . Gouty arthritis   . Varicose veins    Past Surgical History  Procedure Laterality Date  . Eye surgery    . Knee surgery    . Gastric bypass     Current Outpatient Prescriptions on File Prior to Visit  Medication Sig Dispense Refill  . amLODipine (NORVASC) 5 MG tablet Take 1 tablet (5 mg total) by mouth daily. 30 tablet 3  . ammonium lactate (LAC-HYDRIN) 12 % lotion Apply 1 application topically 2 (two) times daily as needed (legs). 400 g 2  . aspirin 81 MG tablet Take 1 tablet (81 mg total) by mouth every 4 (four) hours as needed.    .  brinzolamide (AZOPT) 1 % ophthalmic suspension Place 1 drop into both eyes 2 (two) times daily.      . carvedilol (COREG) 3.125 MG tablet Take 3.125 mg by mouth daily.     . diazepam (VALIUM) 2 MG tablet Take 1 tablet (2 mg total) by mouth every 12 (twelve) hours as needed for muscle spasms. 60 tablet 0  . docusate sodium (COLACE) 100 MG capsule Take 100 mg by mouth 2 (two) times daily.    . ferrous sulfate 325 (65 FE) MG tablet Take 325 mg by mouth daily with breakfast.    . gabapentin (NEURONTIN) 300 MG capsule Take 300 mg by mouth 2 (two) times daily.    Marland Kitchen gabapentin (NEURONTIN) 300 MG capsule TAKE 2 CAPSULES BY MOUTH 3 TIMES A DAY 180 capsule 3  . glucose blood (ACCU-CHEK SMARTVIEW) test strip 1 each by Other route See admin instructions. Check blood sugar 3 times daily.    Marland Kitchen guaiFENesin-codeine (ROBITUSSIN AC) 100-10 MG/5ML syrup Take 5 mLs by mouth every 6 (six) hours. 120 mL 0  . latanoprost (XALATAN) 0.005 % ophthalmic solution Place 1 drop into the left eye at bedtime.     Marland Kitchen LINZESS 145 MCG CAPS capsule TAKE 1 CAPSULE BY MOUTH DAILY. 30 capsule 11  . lisinopril (PRINIVIL,ZESTRIL) 5 MG tablet Take 1 tablet (5 mg  total) by mouth daily. 30 tablet 3  . losartan (COZAAR) 25 MG tablet Take 12.5 mg by mouth.     . Multiple Vitamin (MULTIVITAMIN) capsule Take 1 capsule by mouth 2 (two) times daily.    Marland Kitchen OVER THE COUNTER MEDICATION Take 1 each by mouth 3 (three) times daily. "Calcium Citrate Chewy Bite 500-500"    . pantoprazole (PROTONIX) 40 MG tablet TAKE 1 TABLET BY MOUTH TWICE A DAY 60 tablet 8  . polyethylene glycol (MIRALAX / GLYCOLAX) packet Take 17 g by mouth at bedtime.    . prednisoLONE acetate (PRED FORTE) 1 % ophthalmic suspension Place 1 drop into the right eye at bedtime.     . rosuvastatin (CRESTOR) 20 MG tablet TAKE 1 TABLET BY MOUTH DAILY 30 tablet 3  . timolol (TIMOPTIC) 0.5 % ophthalmic solution Place 1 drop into both eyes 2 (two) times daily.    . traMADol (ULTRAM) 50 MG  tablet Take 1 tablet (50 mg total) by mouth every 6 (six) hours as needed. 15 tablet 0  . ULORIC 40 MG tablet Take 40 mg by mouth daily.  5  . UNABLE TO FIND Dispense one pair of Diabetic Shoes. ICD E11.49, G62.9, B35.1, R60.9 1 each 0  . vitamin B-12 (CYANOCOBALAMIN) 1000 MCG tablet Take 1,000 mcg by mouth. (Chewable tablet)     No current facility-administered medications on file prior to visit.   Allergies  Allergen Reactions  . Penicillins Swelling and Rash  . Amoxicillin Swelling and Rash  . Niacin Itching and Other (See Comments)    Other reaction(s): Flushing (ALLERGY/intolerance)  . Penicillin G Rash   Social History   Social History  . Marital Status: Married    Spouse Name: N/A  . Number of Children: N/A  . Years of Education: N/A   Occupational History  . Not on file.   Social History Main Topics  . Smoking status: Never Smoker   . Smokeless tobacco: Never Used  . Alcohol Use: No  . Drug Use: No  . Sexual Activity: Not on file   Other Topics Concern  . Not on file   Social History Narrative     Review of Systems  All other systems reviewed and are negative.      Objective:   Physical Exam  Constitutional: He is oriented to person, place, and time.  HENT:  Right Ear: External ear normal.  Left Ear: External ear normal.  Eyes: EOM are normal. Pupils are equal, round, and reactive to light.  Cardiovascular: Normal rate, regular rhythm and normal heart sounds.   Pulmonary/Chest: Effort normal and breath sounds normal. No respiratory distress. He has no wheezes. He has no rales.  Abdominal: Soft. Bowel sounds are normal.  Musculoskeletal:       Right shoulder: He exhibits decreased range of motion, tenderness, pain and decreased strength. He exhibits no bony tenderness.       Lumbar back: He exhibits decreased range of motion, tenderness, pain and spasm. He exhibits no bony tenderness.  Neurological: He is alert and oriented to person, place, and time.  He has normal reflexes. He displays normal reflexes. No cranial nerve deficit. He exhibits normal muscle tone. Coordination normal.  Vitals reviewed.         Assessment & Plan:  Midline low back pain without sciatica  Right shoulder pain  Dizziness   I believe the low back pain is due to a pulled muscle in his lower back. I recommended Flexeril 5-10 mg every  8 hours as needed, heat, and rest. I believe the shoulder pain is bursitis in his shoulder. He cannot take NSAIDs due to his chronic kidney disease. Therefore I have recommended that he take Tylenol. If the pain is not improving over the next week or so, he can return for cortisone injection in his shoulder. I recommended he wear the compression hose every day to help decrease the swelling in his legs so that the skin tear can heal. I believe the dizziness could be orthostatic dizziness and I would like to give the medication changes a chance to take effect prior to making any other changes. Patient's kidney doctor recently taken off metformin due to his worsening kidney function. I will start the patient today on Tradjenta 2.5 mg poqday  Until he can see his endocrinologist

## 2015-07-02 ENCOUNTER — Encounter: Payer: Self-pay | Admitting: Cardiology

## 2015-07-02 ENCOUNTER — Ambulatory Visit (INDEPENDENT_AMBULATORY_CARE_PROVIDER_SITE_OTHER): Payer: Medicaid Other | Admitting: Podiatry

## 2015-07-02 ENCOUNTER — Ambulatory Visit (INDEPENDENT_AMBULATORY_CARE_PROVIDER_SITE_OTHER): Payer: Medicaid Other | Admitting: Cardiology

## 2015-07-02 ENCOUNTER — Encounter: Payer: Self-pay | Admitting: Podiatry

## 2015-07-02 VITALS — BP 126/80 | HR 64 | Ht 70.0 in | Wt 269.2 lb

## 2015-07-02 DIAGNOSIS — R0989 Other specified symptoms and signs involving the circulatory and respiratory systems: Secondary | ICD-10-CM | POA: Diagnosis not present

## 2015-07-02 DIAGNOSIS — N183 Chronic kidney disease, stage 3 unspecified: Secondary | ICD-10-CM

## 2015-07-02 DIAGNOSIS — R079 Chest pain, unspecified: Secondary | ICD-10-CM | POA: Diagnosis not present

## 2015-07-02 DIAGNOSIS — G6289 Other specified polyneuropathies: Secondary | ICD-10-CM

## 2015-07-02 DIAGNOSIS — M79676 Pain in unspecified toe(s): Secondary | ICD-10-CM

## 2015-07-02 DIAGNOSIS — B351 Tinea unguium: Secondary | ICD-10-CM | POA: Diagnosis not present

## 2015-07-02 NOTE — Patient Instructions (Signed)
Medication Instructions:  NO CHANGES  Labwork: NONE  Testing/Procedures: Your physician has requested that you have en exercise stress myoview. For further information please visit [email or web address] Please follow instruction sheet, as given.  Your physician has requested that you have a carotid duplex. This test is an ultrasound of the carotid arteries in your neck. It looks at blood flow through these arteries that supply the brain with blood. Allow one hour for this exam. There are no restrictions or special instructions.   Follow-Up: Your physician wants you to follow-up in: 6 MONTHS  WITH  DR Algis Liming will receive a reminder letter in the mail two months in advance. If you don't receive a letter, please call our office to schedule the follow-up appointment.  Any Other Special Instructions Will Be Listed Below (If Applicable).     If you need a refill on your cardiac medications before your next appointment, please call your pharmacy.

## 2015-07-02 NOTE — Progress Notes (Signed)
Patient ID: Eziah Negro, male   DOB: August 08, 1955, 60 y.o.   MRN: [id number]  Subjective: 60 y.o. returns the office today for painful, elongated, thickened toenails which she is unable to trim herself. Denies any redness or drainage around the nails. Denies any acute changes since last appointment and no new complaints today. Denies any systemic complaints such as fevers, chills, nausea, vomiting.   Objective: AAO 3, NAD DP/PT pulses palpable, CRT less than 3 seconds Protective sensation decreased with Simms Weinstein monofilament Nails hypertrophic, dystrophic, elongated, brittle, discolored 10. There is tenderness overlying the nails 1-5 bilaterally. There is no surrounding erythema or drainage along the nail sites. No open lesions or pre-ulcerative lesions are identified bilaterally.  No other areas of tenderness bilateral lower extremities. No overlying edema, erythema, increased warmth. No pain with calf compression, swelling, warmth, erythema.  Assessment: Patient presents with symptomatic onychomycosis  Plan: -Treatment options including alternatives, risks, complications were discussed -Nails sharply debrided 10 without complication/bleeding. -Discussed daily foot inspection. If there are any changes, to call the office immediately.  -Follow-up in 3 months or sooner if any problems are to arise. In the meantime, encouraged to call the office with any questions, concerns, changes symptoms.  Ovid Curd, DPM

## 2015-07-02 NOTE — Progress Notes (Signed)
[address] [place], [place]  [address] Phone: [phone] Fax:  [phone]  Date:  07/02/2015   ID:  Alexander Hubbard, DOB Feb 13, 1956, MRN [id number]  PCP:  Leo Grosser, MD   History of Present Illness: Alexander Hubbard is a 60 y.o. male here for follow up post weight reduction surgery. Has lost significant amount of weight. He feels much better. Had prior issues with emesis. Endoscopy performed.  Ulcer has healed.  He has a history of morbid obesity, hypertension, diabetes, chronic kidney disease, chronic diastolic heart failure, noncompliant obstructive sleep apnea CPAP.  EF probably 45%. NUC stress 4/13 low risk (reduced sensitivity due to attenuation artifact).  Currently he feels at baseline. Throbbing in chest pain. Occasionally left-sided chest discomfort, somewhat fleeting he is using workout equipment. . No significant shortness of breath, in fact he feels better from a breathing perspective as he has in a long time.    no strokelike symptoms, no syncope   Wt Readings from Last 3 Encounters:  07/02/15 269 lb 3.2 oz (122.108 kg)  06/29/15 268 lb (121.564 kg)  02/09/15 270 lb (122.471 kg)     Past Medical History  Diagnosis Date  . Diabetes mellitus   . Hypertension   . Obesity   . Sleep apnea   . Gouty arthritis   . Varicose veins     Past Surgical History  Procedure Laterality Date  . Eye surgery    . Knee surgery    . Gastric bypass      Current Outpatient Prescriptions  Medication Sig Dispense Refill  . amLODipine (NORVASC) 5 MG tablet Take 1 tablet (5 mg total) by mouth daily. 30 tablet 3  . ammonium lactate (LAC-HYDRIN) 12 % lotion Apply 1 application topically 2 (two) times daily as needed (legs). 400 g 2  . aspirin 81 MG tablet Take 81 mg by mouth daily.    . brinzolamide (AZOPT) 1 % ophthalmic suspension Place 1 drop into the left eye 2 (two) times daily.     . carvedilol (COREG) 3.125 MG tablet Take 3.125 mg by mouth daily.      . diazepam (VALIUM) 2 MG tablet Take 1 tablet (2 mg total) by mouth every 12 (twelve) hours as needed for muscle spasms. 60 tablet 0  . docusate sodium (COLACE) 100 MG capsule Take 100 mg by mouth 2 (two) times daily.    . ferrous sulfate 325 (65 FE) MG tablet Take 325 mg by mouth daily with breakfast.    . gabapentin (NEURONTIN) 300 MG capsule TAKE 2 CAPSULES BY MOUTH 3 TIMES A DAY 180 capsule 3  . glucose blood (ACCU-CHEK SMARTVIEW) test strip 1 each by Other route See admin instructions. Check blood sugar 3 times daily.    Marland Kitchen latanoprost (XALATAN) 0.005 % ophthalmic solution Place 1 drop into the left eye at bedtime.     Marland Kitchen linagliptin (TRADJENTA) 5 MG TABS tablet Take 5 mg by mouth daily.    Marland Kitchen LINZESS 145 MCG CAPS capsule TAKE 1 CAPSULE BY MOUTH DAILY. 30 capsule 11  . losartan (COZAAR) 25 MG tablet Take 12.5 mg by mouth. Patient takes every other day    . Multiple Vitamin (MULTIVITAMIN) capsule Take 1 capsule by mouth 2 (two) times daily.    Marland Kitchen OVER THE COUNTER MEDICATION Take 1 each by mouth 3 (three) times daily. "Calcium Citrate Chewy Bite 500-500"    . pantoprazole (PROTONIX) 40 MG tablet TAKE 1 TABLET BY MOUTH  TWICE A DAY 60 tablet 8  . polyethylene glycol (MIRALAX / GLYCOLAX) packet Take 17 g by mouth daily as needed.     . prednisoLONE acetate (PRED FORTE) 1 % ophthalmic suspension Place 1 drop into the right eye at bedtime.     . rosuvastatin (CRESTOR) 20 MG tablet TAKE 1 TABLET BY MOUTH DAILY 30 tablet 3  . timolol (TIMOPTIC) 0.5 % ophthalmic solution Place 1 drop into both eyes 2 (two) times daily.    . traMADol (ULTRAM) 50 MG tablet Take 1 tablet (50 mg total) by mouth every 6 (six) hours as needed. 15 tablet 0  . ULORIC 40 MG tablet Take 40 mg by mouth daily.  5  . UNABLE TO FIND Dispense one pair of Diabetic Shoes. ICD E11.49, G62.9, B35.1, R60.9 1 each 0  . vitamin B-12 (CYANOCOBALAMIN) 1000 MCG tablet Take 1,000 mcg by mouth. (Chewable tablet)     No current  facility-administered medications for this visit.    Allergies:    Allergies  Allergen Reactions  . Penicillins Swelling and Rash  . Amoxicillin Swelling and Rash  . Niacin Itching and Other (See Comments)    Other reaction(s): Flushing (ALLERGY/intolerance)  . Penicillin G Rash    Social History:  The patient  reports that he has never smoked. He has never used smokeless tobacco. He reports that he does not drink alcohol or use illicit drugs.   ROS:  Please see the history of present illness.    Denies any chest discomfort, no significant increase in shortness of breath, positive for isolated syncopal episode as described above, no rashes, no fevers, no cough.    PHYSICAL EXAM: VS:  BP 126/80 mmHg  Pulse 64  Ht  (1.778 m)  Wt 269 lb 3.2 oz (122.108 kg)  BMI 38.63 kg/m2 Well nourished, well developed, in no acute distress HEENT: normalright neck bruit.  Neck: no JVD,  Cardiac:  normal S1, S2; RRR; soft systolic murmurRight upper sternal border, distant heart sounds due to to body habitus Lungs:  clear to auscultation bilaterally, no wheezing, rhonchi or rales Abd: soft, nontender, no hepatomegalyMorbid obesity improved Ext: no edemaChronic venous stasis changes however edema is much improved Skin: warm and dry Neuro: no focal abnormalities noted  EKG:     EKG is done today. 07/02/15 shows sinus rhythm, first-degree AV block, PR interval 226 ms, right bundle branch block , no other ST segment changes. No significant change from prior-04/14/14-normal sinus rhythm, 66, right bundle branch block.  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  carotid bruit-right side-we will check of carotid ultrasound 2.  Atypical chest pain-it is been several years since he's had a stress test-we will check a nuclear stress test -treadmill. Hold carvedilol prior to stress test. 3. Weight loss-significant weight loss post gastric  surgery. RBBB no change. 4. Morbid obesity- 268 from 408. Wonderful.  No symptoms. Doing well. Continue to encourage exercise. Decrease aspirin to 81 mg. 5. Hypertension-currently controlled. Watching with kidney disease. Does not take Advil. Only Tylenol. 6. Hyperlipidemia- Crestor but he does not know which formulation of statin he is taking. 7. Diabetes-per primary team. No longer on insulin 8. Obstructive sleep apnea-encourage use of CPAP. 9. Chronic kidney disease stage III-he is seeing nephrology. Last creatinine was in the 2.5 range. 10. Erectile dysfunction-I'm fine with him utilizing Viagra 100 mg. however cost was over $200. Avoid nitrates. 11. Venous insufficiency-reviewed Dr. Bosie Helper  note ablation of saphenous vein discussed. 12. We will see back in 6 months.  Signed, Donato Schultz, MD [facility]  07/02/2015 11:07 AM

## 2015-07-02 NOTE — Patient Instructions (Signed)
CETAPHIL CREAM  Is my favorite over the counter moisturizer.  Apply it 2-3 times a day for dry skin.   

## 2015-07-04 DIAGNOSIS — N390 Urinary tract infection, site not specified: Secondary | ICD-10-CM

## 2015-07-04 DIAGNOSIS — N179 Acute kidney failure, unspecified: Secondary | ICD-10-CM

## 2015-07-04 DIAGNOSIS — M109 Gout, unspecified: Secondary | ICD-10-CM

## 2015-07-04 HISTORY — DX: Urinary tract infection, site not specified: N39.0

## 2015-07-04 HISTORY — DX: Acute kidney failure, unspecified: N17.9

## 2015-07-04 HISTORY — DX: Gout, unspecified: M10.9

## 2015-07-05 ENCOUNTER — Telehealth (HOSPITAL_COMMUNITY): Payer: Self-pay

## 2015-07-05 NOTE — Telephone Encounter (Signed)
Encounter complete. 

## 2015-07-06 ENCOUNTER — Ambulatory Visit (HOSPITAL_COMMUNITY)
Admission: RE | Admit: 2015-07-06 | Discharge: 2015-07-06 | Disposition: A | Discharge status: Home / Self Care | Payer: Medicaid Other | Source: Ambulatory Visit | Attending: Cardiology | Admitting: Cardiology

## 2015-07-06 ENCOUNTER — Encounter (HOSPITAL_COMMUNITY): Payer: Self-pay | Admitting: Emergency Medicine

## 2015-07-06 ENCOUNTER — Emergency Department (HOSPITAL_COMMUNITY)
Admission: EM | Admit: 2015-07-06 | Discharge: 2015-07-06 | Disposition: A | Discharge status: Home / Self Care | Payer: Medicaid Other | Attending: Emergency Medicine | Admitting: Emergency Medicine

## 2015-07-06 ENCOUNTER — Emergency Department (HOSPITAL_COMMUNITY): Payer: Medicaid Other

## 2015-07-06 DIAGNOSIS — E669 Obesity, unspecified: Secondary | ICD-10-CM | POA: Diagnosis not present

## 2015-07-06 DIAGNOSIS — Z8739 Personal history of other diseases of the musculoskeletal system and connective tissue: Secondary | ICD-10-CM | POA: Diagnosis not present

## 2015-07-06 DIAGNOSIS — S8992XA Unspecified injury of left lower leg, initial encounter: Secondary | ICD-10-CM | POA: Diagnosis present

## 2015-07-06 DIAGNOSIS — I10 Essential (primary) hypertension: Secondary | ICD-10-CM | POA: Insufficient documentation

## 2015-07-06 DIAGNOSIS — W1839XA Other fall on same level, initial encounter: Secondary | ICD-10-CM | POA: Insufficient documentation

## 2015-07-06 DIAGNOSIS — I4510 Unspecified right bundle-branch block: Secondary | ICD-10-CM | POA: Diagnosis not present

## 2015-07-06 DIAGNOSIS — Y998 Other external cause status: Secondary | ICD-10-CM | POA: Diagnosis not present

## 2015-07-06 DIAGNOSIS — Y93A1 Activity, exercise machines primarily for cardiorespiratory conditioning: Secondary | ICD-10-CM | POA: Insufficient documentation

## 2015-07-06 DIAGNOSIS — R0989 Other specified symptoms and signs involving the circulatory and respiratory systems: Secondary | ICD-10-CM | POA: Insufficient documentation

## 2015-07-06 DIAGNOSIS — I6523 Occlusion and stenosis of bilateral carotid arteries: Secondary | ICD-10-CM | POA: Insufficient documentation

## 2015-07-06 DIAGNOSIS — Z880 Allergy status to penicillin: Secondary | ICD-10-CM | POA: Insufficient documentation

## 2015-07-06 DIAGNOSIS — S82022A Displaced longitudinal fracture of left patella, initial encounter for closed fracture: Secondary | ICD-10-CM | POA: Diagnosis not present

## 2015-07-06 DIAGNOSIS — S82002A Unspecified fracture of left patella, initial encounter for closed fracture: Secondary | ICD-10-CM

## 2015-07-06 DIAGNOSIS — Z7982 Long term (current) use of aspirin: Secondary | ICD-10-CM | POA: Insufficient documentation

## 2015-07-06 DIAGNOSIS — Z79899 Other long term (current) drug therapy: Secondary | ICD-10-CM | POA: Diagnosis not present

## 2015-07-06 DIAGNOSIS — Z6838 Body mass index (BMI) 38.0-38.9, adult: Secondary | ICD-10-CM | POA: Diagnosis not present

## 2015-07-06 DIAGNOSIS — S8991XA Unspecified injury of right lower leg, initial encounter: Secondary | ICD-10-CM | POA: Insufficient documentation

## 2015-07-06 DIAGNOSIS — E119 Type 2 diabetes mellitus without complications: Secondary | ICD-10-CM | POA: Insufficient documentation

## 2015-07-06 DIAGNOSIS — Z8249 Family history of ischemic heart disease and other diseases of the circulatory system: Secondary | ICD-10-CM | POA: Diagnosis not present

## 2015-07-06 DIAGNOSIS — S99922A Unspecified injury of left foot, initial encounter: Secondary | ICD-10-CM | POA: Diagnosis not present

## 2015-07-06 DIAGNOSIS — S99912A Unspecified injury of left ankle, initial encounter: Secondary | ICD-10-CM | POA: Insufficient documentation

## 2015-07-06 DIAGNOSIS — R079 Chest pain, unspecified: Secondary | ICD-10-CM | POA: Diagnosis not present

## 2015-07-06 DIAGNOSIS — W19XXXA Unspecified fall, initial encounter: Secondary | ICD-10-CM

## 2015-07-06 DIAGNOSIS — Y9289 Other specified places as the place of occurrence of the external cause: Secondary | ICD-10-CM | POA: Diagnosis not present

## 2015-07-06 LAB — MYOCARDIAL PERFUSION IMAGING
CHL CUP MPHR: 161 {beats}/min
CHL CUP NUCLEAR SSS: 10
CSEPEW: 5.2 METS
CSEPPHR: 142 {beats}/min
Exercise duration (min): 3 min
Exercise duration (sec): 47 s
LVDIAVOL: 96 mL
LVSYSVOL: 38 mL
NUC STRESS TID: 0.92
Percent HR: 88 %
Rest HR: 69 {beats}/min
SDS: 4
SRS: 6

## 2015-07-06 MED ORDER — TECHNETIUM TC 99M SESTAMIBI GENERIC - CARDIOLITE
30.2000 | Freq: Once | INTRAVENOUS | Status: AC | PRN
  Administered 2015-07-06: 30.2 via INTRAVENOUS

## 2015-07-06 MED ORDER — HYDROCODONE-ACETAMINOPHEN 5-325 MG PO TABS
2.0000 | ORAL_TABLET | Freq: Once | ORAL | Status: AC
  Administered 2015-07-06: 2 via ORAL
  Filled 2015-07-06: qty 2

## 2015-07-06 MED ORDER — OXYCODONE-ACETAMINOPHEN 5-325 MG PO TABS: ORAL_TABLET | ORAL | Status: DC

## 2015-07-06 MED ORDER — TECHNETIUM TC 99M SESTAMIBI GENERIC - CARDIOLITE
9.8000 | Freq: Once | INTRAVENOUS | Status: AC | PRN
  Administered 2015-07-06: 9.8 via INTRAVENOUS

## 2015-07-06 NOTE — ED Notes (Signed)
Pt returned from X-ray.  

## 2015-07-06 NOTE — ED Notes (Signed)
Pt returned from CT °

## 2015-07-06 NOTE — ED Provider Notes (Signed)
CSN: [id number]     Arrival date & time 07/06/15  1435 History  By signing my name below, I, Elon Spanner, attest that this documentation has been prepared under the direction and in the presence of [employer], PA-C. Electronically Signed: Elon Spanner ED Scribe. 07/06/2015. 3:07 PM    Chief Complaint  Patient presents with  . Knee Pain  . Foot Pain   The history is provided by the patient. No language interpreter was used.    HPI Comments: Alexander Hubbard is a 60 y.o. male who presents to the Emergency Department for evaluation 6/10 left > right knee pain as well as left foot and ankle pain which all onset this morning.  The patient reports he was having a cardiac stress test and fell onto his bilateral knees and twisted his left knee and ankle.  He caught himself with the handrails and never fell completely to the ground.  He denies head trauma, CP.  Tetanus UTD.   Past Medical History  Diagnosis Date  . Diabetes mellitus   . Hypertension   . Obesity   . Sleep apnea   . Gouty arthritis   . Varicose veins    Past Surgical History  Procedure Laterality Date  . Eye surgery    . Knee surgery    . Gastric bypass     Family History  Problem Relation Age of Onset  . Hypertension Sister   . Diabetes Mellitus II Sister    Social History  Substance Use Topics  . Smoking status: Never Smoker   . Smokeless tobacco: Never Used  . Alcohol Use: No    Review of Systems A complete 10 system review of systems was obtained and all systems are negative except as noted in the HPI and PMH.   Allergies  Penicillins; Amoxicillin; Niacin; and Penicillin g  Home Medications   Prior to Admission medications   Medication Sig Start Date End Date Taking? Authorizing Provider  acetaminophen (TYLENOL) 500 MG tablet Take 1,000 mg by mouth every 6 (six) hours as needed.    Historical Provider, MD  amLODipine (NORVASC) 5 MG tablet Take 1 tablet (5 mg total) by mouth daily. 08/04/14   Ripudeep Jenna Luo, MD  ammonium lactate (LAC-HYDRIN) 12 % lotion Apply 1 application topically 2 (two) times daily as needed (legs). 08/30/13   Delories Heinz, DPM  aspirin 81 MG tablet Take 81 mg by mouth daily.    Historical Provider, MD  brinzolamide (AZOPT) 1 % ophthalmic suspension Place 1 drop into the left eye 2 (two) times daily.     Historical Provider, MD  carvedilol (COREG) 3.125 MG tablet Take 3.125 mg by mouth daily.     Historical Provider, MD  diazepam (VALIUM) 2 MG tablet Take 1 tablet (2 mg total) by mouth every 12 (twelve) hours as needed for muscle spasms. 09/21/14   Patriciaann Clan Dixon, PA-C  docusate sodium (COLACE) 100 MG capsule Take 100 mg by mouth 2 (two) times daily.    Historical Provider, MD  ferrous sulfate 325 (65 FE) MG tablet Take 325 mg by mouth daily with breakfast.    Historical Provider, MD  gabapentin (NEURONTIN) 300 MG capsule TAKE 2 CAPSULES BY MOUTH 3 TIMES A DAY 03/13/15   Donita Brooks, MD  glucose blood (ACCU-CHEK SMARTVIEW) test strip 1 each by Other route See admin instructions. Check blood sugar 3 times daily.    Historical Provider, MD  latanoprost (XALATAN) 0.005 % ophthalmic solution Place 1  drop into the left eye at bedtime.     Historical Provider, MD  linagliptin (TRADJENTA) 5 MG TABS tablet Take 5 mg by mouth daily.    Historical Provider, MD  LINZESS 145 MCG CAPS capsule TAKE 1 CAPSULE BY MOUTH DAILY. 05/23/15   Donita Brooks, MD  losartan (COZAAR) 25 MG tablet Take 12.5 mg by mouth. Patient takes every other day 10/20/14   Historical Provider, MD  Multiple Vitamin (MULTIVITAMIN) capsule Take 1 capsule by mouth 2 (two) times daily.    Historical Provider, MD  OVER THE COUNTER MEDICATION Take 1 each by mouth 3 (three) times daily. "Calcium Citrate Chewy Bite 500-500"    Historical Provider, MD  oxyCODONE-acetaminophen (PERCOCET/ROXICET) 5-325 MG tablet 1 to 2 tabs PO q6hrs  PRN for pain 07/06/15   Joni Reining Kion Huntsberry, PA-C  pantoprazole (PROTONIX) 40 MG tablet TAKE 1  TABLET BY MOUTH TWICE A DAY 03/13/15   Salley Scarlet, MD  polyethylene glycol [facility] / GLYCOLAX) packet Take 17 g by mouth daily as needed.     Historical Provider, MD  prednisoLONE acetate (PRED FORTE) 1 % ophthalmic suspension Place 1 drop into the right eye at bedtime.     Historical Provider, MD  rosuvastatin (CRESTOR) 20 MG tablet TAKE 1 TABLET BY MOUTH DAILY 05/17/15   Donita Brooks, MD  timolol (TIMOPTIC) 0.5 % ophthalmic solution Place 1 drop into both eyes 2 (two) times daily.    Historical Provider, MD  traMADol (ULTRAM) 50 MG tablet Take 1 tablet (50 mg total) by mouth every 6 (six) hours as needed. 11/13/14   Junius Finner, PA-C  ULORIC 40 MG tablet Take 40 mg by mouth daily. 12/30/14   Historical Provider, MD  UNABLE TO FIND Dispense one pair of Diabetic Shoes. ICD E11.49, G62.9, B35.1, R60.9 06/12/14   Donita Brooks, MD  vitamin B-12 (CYANOCOBALAMIN) 1000 MCG tablet Take 1,000 mcg by mouth. (Chewable tablet)    Historical Provider, MD   BP 170/67 mmHg  Pulse 63  Temp(Src) 97.7 F (36.5 C) (Oral)  Resp 18  Ht 5\' 10"  (1.778 m)  Wt 119.75 kg  BMI 37.88 kg/m2  SpO2 99% Physical Exam  Constitutional: He is oriented to person, place, and time. He appears well-developed and well-nourished. No distress.  HENT:  Head: Normocephalic and atraumatic.  Eyes: Conjunctivae and EOM are normal. Pupils are equal, round, and reactive to light.  Neck: Normal range of motion. Neck supple. No tracheal deviation present.  No midline C-spine  tenderness to palpation or step-offs appreciated. Patient has full range of motion without pain.  Grip strength, biceps, triceps 5/5 bilaterally;  can differentiate between pinprick and light touch bilaterally.   Cardiovascular: Normal rate and regular rhythm.   Pulmonary/Chest: Effort normal and breath sounds normal. No stridor. No respiratory distress. He has no wheezes. He has no rales. He exhibits no tenderness.  Abdominal: Soft. Bowel sounds  are normal. He exhibits no distension and no mass. There is no tenderness. There is no rebound and no guarding.  Musculoskeletal: Normal range of motion. He exhibits tenderness. He exhibits no edema.  Full range of motion to bilateral knees, left knee more tender than right, is distally neurovascularly intact bilaterally, he is tender to palpation over the bilateral malleoli on the left. No hip pain, able to fully flex hip.  Neurological: He is alert and oriented to person, place, and time.  Skin: Skin is warm and dry.  Psychiatric: He has a normal mood and affect.  His behavior is normal.  Nursing note and vitals reviewed.   ED Course  Procedures (including critical care time)  DIAGNOSTIC STUDIES: Oxygen Saturation is 100% on RA, normal by my interpretation.    COORDINATION OF CARE:  3:26 PM Discussed plan to order imaging of foot and pain medication.  Patient acknowledges and agrees with plan.    Labs Review Labs Reviewed - No data to display  Imaging Review Dg Ankle Complete Left  07/06/2015  CLINICAL DATA:  Fall during treadmill stress status EXAM: LEFT ANKLE COMPLETE - 3+ VIEW COMPARISON:  None. FINDINGS: No fracture or dislocation is seen. The ankle mortise is intact. Moderate degenerative changes of the dorsal midfoot. Vascular calcifications. Associated soft tissue calcifications along the distal lower extremity. IMPRESSION: No fracture or dislocation is seen. Electronically Signed   By: Charline Bills M.D.   On: 07/06/2015 16:42   Ct Knee Left Wo Contrast  07/06/2015  CLINICAL DATA:  Acute left knee pain after fall on treadmill today. EXAM: CT OF THE left KNEE WITHOUT CONTRAST TECHNIQUE: Multidetector CT imaging of the left knee was performed according to the standard protocol. Multiplanar CT image reconstructions were also generated. COMPARISON:  Radiograph of same day. FINDINGS: Minimally displaced longitudinal fracture is seen involving the lateral portion of the patella.  Chondrocalcinosis is noted in the medial lateral joint spaces. Minimal osteophyte formation is also noted medially and laterally. No fracture is seen involving the visualized portion of the distal left femur or proximal tibia or fibula. Vascular calcifications are noted. Moderate joint effusion is noted. IMPRESSION: Minimally displaced longitudinal fracture of the left patella is noted. Moderate joint effusion is noted as well. Degenerative changes are also noted. Electronically Signed   By: Lupita Raider, M.D.   On: 07/06/2015 17:43   Dg Knee Complete 4 Views Left  07/06/2015  CLINICAL DATA:  Status post fall during a cardiac stress test today with a blow to the left knee. Pain. Initial encounter. EXAM: LEFT KNEE - COMPLETE 4+ VIEW COMPARISON:  Plain films left knee 11/13/2014. FINDINGS: There is a linear lucency through the patella seen on the AP and oblique views worrisome for fracture. Moderate joint effusion is identified. Joint spaces are preserved. Chondrocalcinosis is noted. IMPRESSION: Likely longitudinal fracture through the lateral patella. This could be confirmed with CT scan. Electronically Signed   By: Drusilla Kanner M.D.   On: 07/06/2015 16:43   Dg Knee Complete 4 Views Right  07/06/2015  CLINICAL DATA:  Fall. EXAM: RIGHT KNEE - COMPLETE 4+ VIEW COMPARISON:  11/13/2014 FINDINGS: No joint effusion. Chondrocalcinosis noted. Vascular calcifications are present. Moderate tricompartment osteoarthritis. No fracture or dislocation. IMPRESSION: 1. No acute findings. 2. Osteoarthritis and chondrocalcinosis. Electronically Signed   By: Signa Kell M.D.   On: 07/06/2015 16:43   Dg Foot Complete Left  07/06/2015  CLINICAL DATA:  Larey Seat during the last minute of a stress test, treadmill was going fast and his feet gave way, fell onto both knees, pain across 3rd to 5th metatarsals and when pressure is applied to the dorsum of the foot, initial encounter EXAM: LEFT FOOT - COMPLETE 3+ VIEW COMPARISON:   None FINDINGS: Osseous demineralization. Joint spaces preserved. No definite acute fracture, dislocation or bone destruction. Significant spur formation at the talonavicular joint with talar beaking. Naviculocuneiform degenerative changes are also present. Extensive small vessel vascular calcification within the foot. Nonspecific subcutaneous calcifications at the LEFT lower leg greatest medially. IMPRESSION: No acute osseous abnormalities. Significant talonavicular degenerative changes with  talar beaking. Electronically Signed   By: Ulyses Southward M.D.   On: 07/06/2015 16:44   I have personally reviewed and evaluated these images and lab results as part of my medical decision-making.   EKG Interpretation None      MDM   Final diagnoses:  Fall, initial encounter  Patella fracture, left, closed, initial encounter    Filed Vitals:   07/06/15 1510 07/06/15 1513 07/06/15 1828  BP:  165/68 170/67  Pulse:  72 63  Temp:  97.7 F (36.5 C) 97.7 F (36.5 C)  TempSrc:  Oral Oral  Resp:  16 18  Height:  (1.778 m)    Weight: 119.75 kg    SpO2:  100% 99%    Medications  HYDROcodone-acetaminophen (NORCO/VICODIN) 5-325 MG per tablet 2 tablet (2 tablets Oral Given 07/06/15 1532)    Alexander Hubbard is 61 y.o. male presenting with pain to bilateral knees and left ankle status post fall while on treadmill earlier in the day. There was no head trauma, x-ray of the left knee with a questionable patellar fracture. CT confirms a slightly displaced longitudinal left patellar fracture. Case discussed with orthopedist Dr. Magnus Ivan who recommends weightbearing as tolerated, crutches and knee immobilizer.  Evaluation does not show pathology that would require ongoing emergent intervention or inpatient treatment. Pt is hemodynamically stable and mentating appropriately. Discussed findings and plan with patient/guardian, who agrees with care plan. All questions answered. Return precautions discussed and  outpatient follow up given.   Discharge Medication List as of 07/06/2015  6:26 PM    START taking these medications   Details  oxyCODONE-acetaminophen (PERCOCET/ROXICET) 5-325 MG tablet 1 to 2 tabs PO q6hrs  PRN for pain, Print            Wynetta Emery, PA-C 07/06/15 2226  Lyndal Pulley, MD 07/07/15 [phone]

## 2015-07-06 NOTE — ED Notes (Signed)
Pt is getting dressed. PA at the bedside

## 2015-07-06 NOTE — ED Notes (Signed)
PA at the bedside.

## 2015-07-06 NOTE — Discharge Instructions (Signed)
You can put weight on the leg as tolerated. Please follow with orthopedist in the next week or so.  Please follow with your primary care doctor in the next 2 days for a check-up. They must obtain records for further management.   Do not hesitate to return to the Emergency Department for any new, worsening or concerning symptoms.   Take percocet for breakthrough pain, do not drink alcohol, drive, care for children or do other critical tasks while taking percocet.   How to Use a Knee Brace A knee brace is a device that you wear to support your knee, especially if the knee is healing after an injury or surgery. There are several types of knee braces. Some are designed to prevent an injury (prophylactic brace). These are often worn during sports. Others support an injured knee (functional brace) or keep it still while it heals (rehabilitative brace). People with severe arthritis of the knee may benefit from a brace that takes some pressure off the knee (unloader brace). Most knee braces are made from a combination of cloth and metal or plastic.  You may need to wear a knee brace to:  Relieve knee pain.  Help your knee support your weight (improve stability).  Help you walk farther (improve mobility).  Prevent injury.  Support your knee while it heals from surgery or from an injury. RISKS AND COMPLICATIONS Generally, knee braces are very safe to wear. However, problems may occur, including:  Skin irritation that may lead to infection.  Making your condition worse if you wear the brace in the wrong way. HOW TO USE A KNEE BRACE Different braces will have different instructions for use. Your health care provider will tell you or show you:  How to put on your brace.  How to adjust the brace.  When and how often to wear the brace.  How to remove the brace.  If you will need any assistive devices in addition to the brace, such as crutches or a cane. In general, your brace should:  Have  the hinge of the brace line up with the bend of your knee.  Have straps, hooks, or tapes that fasten snugly around your leg.  Not feel too tight or too loose. HOW TO CARE FOR A KNEE BRACE  Check your brace often for signs of damage, such as loose connections or attachments. Your knee brace may get damaged or wear out during normal use.  Wash the fabric parts of your brace with soap and water.  Read the insert that comes with your brace for other specific care instructions. SEEK MEDICAL CARE IF:  Your knee brace is too loose or too tight and you cannot adjust it.  Your knee brace causes skin redness, swelling, bruising, or irritation.  Your knee brace is not helping.  Your knee brace is making your knee pain worse.   This information is not intended to replace advice given to you by your health care provider. Make sure you discuss any questions you have with your health care provider.   Document Released: 08/09/2003 Document Revised: 02/07/2015 Document Reviewed: 09/11/2014 Elsevier Interactive Patient Education [employer]

## 2015-07-06 NOTE — ED Notes (Signed)
Onset today using treadmill for stress test and during the incline patient fell onto treadmill bilateral knee and twisted left foot.  Sent to ED for evaluation.

## 2015-07-10 ENCOUNTER — Telehealth: Payer: Self-pay | Admitting: Cardiology

## 2015-07-10 NOTE — Telephone Encounter (Signed)
New message      Pt had a stress test last Friday.  While on the treadmill, he states that he fell off the treadmill.  He went to the ER after he left our office.  He is calling to let us know that he broke his knee cap.  He saw an orthopedic yesterday.

## 2015-07-10 NOTE — Telephone Encounter (Signed)
Spoke with Koren Shiver at [facility]. Appropriate follow-up initiated.  Management notified of patient's report.

## 2015-07-15 ENCOUNTER — Other Ambulatory Visit: Payer: Self-pay | Admitting: Family Medicine

## 2015-07-16 ENCOUNTER — Telehealth: Payer: Self-pay | Admitting: Family Medicine

## 2015-07-16 DIAGNOSIS — I129 Hypertensive chronic kidney disease with stage 1 through stage 4 chronic kidney disease, or unspecified chronic kidney disease: Secondary | ICD-10-CM | POA: Diagnosis present

## 2015-07-16 DIAGNOSIS — Z7984 Long term (current) use of oral hypoglycemic drugs: Secondary | ICD-10-CM

## 2015-07-16 DIAGNOSIS — Z6837 Body mass index (BMI) 37.0-37.9, adult: Secondary | ICD-10-CM

## 2015-07-16 DIAGNOSIS — G4730 Sleep apnea, unspecified: Secondary | ICD-10-CM | POA: Diagnosis present

## 2015-07-16 DIAGNOSIS — S82002D Unspecified fracture of left patella, subsequent encounter for closed fracture with routine healing: Secondary | ICD-10-CM

## 2015-07-16 DIAGNOSIS — M10041 Idiopathic gout, right hand: Secondary | ICD-10-CM | POA: Diagnosis present

## 2015-07-16 DIAGNOSIS — W19XXXD Unspecified fall, subsequent encounter: Secondary | ICD-10-CM | POA: Diagnosis present

## 2015-07-16 DIAGNOSIS — Z79899 Other long term (current) drug therapy: Secondary | ICD-10-CM

## 2015-07-16 DIAGNOSIS — E669 Obesity, unspecified: Secondary | ICD-10-CM | POA: Diagnosis present

## 2015-07-16 DIAGNOSIS — Z7982 Long term (current) use of aspirin: Secondary | ICD-10-CM

## 2015-07-16 DIAGNOSIS — M10061 Idiopathic gout, right knee: Secondary | ICD-10-CM | POA: Diagnosis present

## 2015-07-16 DIAGNOSIS — K219 Gastro-esophageal reflux disease without esophagitis: Secondary | ICD-10-CM | POA: Diagnosis present

## 2015-07-16 DIAGNOSIS — Z9884 Bariatric surgery status: Secondary | ICD-10-CM

## 2015-07-16 DIAGNOSIS — N183 Chronic kidney disease, stage 3 (moderate): Secondary | ICD-10-CM | POA: Diagnosis present

## 2015-07-16 DIAGNOSIS — I739 Peripheral vascular disease, unspecified: Secondary | ICD-10-CM | POA: Diagnosis present

## 2015-07-16 DIAGNOSIS — E860 Dehydration: Secondary | ICD-10-CM | POA: Diagnosis present

## 2015-07-16 DIAGNOSIS — E1122 Type 2 diabetes mellitus with diabetic chronic kidney disease: Secondary | ICD-10-CM | POA: Diagnosis present

## 2015-07-16 DIAGNOSIS — M10071 Idiopathic gout, right ankle and foot: Secondary | ICD-10-CM | POA: Diagnosis present

## 2015-07-16 DIAGNOSIS — I872 Venous insufficiency (chronic) (peripheral): Secondary | ICD-10-CM | POA: Diagnosis present

## 2015-07-16 DIAGNOSIS — E1142 Type 2 diabetes mellitus with diabetic polyneuropathy: Secondary | ICD-10-CM | POA: Diagnosis present

## 2015-07-16 DIAGNOSIS — E785 Hyperlipidemia, unspecified: Secondary | ICD-10-CM | POA: Diagnosis present

## 2015-07-16 DIAGNOSIS — N179 Acute kidney failure, unspecified: Principal | ICD-10-CM | POA: Diagnosis present

## 2015-07-17 ENCOUNTER — Inpatient Hospital Stay (HOSPITAL_COMMUNITY)
Admission: EM | Admit: 2015-07-17 | Discharge: 2015-07-19 | DRG: 684 | Disposition: A | Discharge status: Home / Self Care | Payer: Medicaid Other | Attending: Internal Medicine | Admitting: Internal Medicine

## 2015-07-17 ENCOUNTER — Emergency Department (HOSPITAL_COMMUNITY): Payer: Medicaid Other

## 2015-07-17 ENCOUNTER — Observation Stay (HOSPITAL_COMMUNITY): Payer: Medicaid Other

## 2015-07-17 ENCOUNTER — Ambulatory Visit: Payer: Medicaid Other | Admitting: Family Medicine

## 2015-07-17 ENCOUNTER — Encounter (HOSPITAL_COMMUNITY): Payer: Self-pay | Admitting: Emergency Medicine

## 2015-07-17 DIAGNOSIS — M10041 Idiopathic gout, right hand: Secondary | ICD-10-CM | POA: Diagnosis not present

## 2015-07-17 DIAGNOSIS — N390 Urinary tract infection, site not specified: Secondary | ICD-10-CM

## 2015-07-17 DIAGNOSIS — M25561 Pain in right knee: Secondary | ICD-10-CM

## 2015-07-17 DIAGNOSIS — M109 Gout, unspecified: Secondary | ICD-10-CM

## 2015-07-17 DIAGNOSIS — N179 Acute kidney failure, unspecified: Secondary | ICD-10-CM | POA: Diagnosis present

## 2015-07-17 DIAGNOSIS — I872 Venous insufficiency (chronic) (peripheral): Secondary | ICD-10-CM

## 2015-07-17 DIAGNOSIS — E785 Hyperlipidemia, unspecified: Secondary | ICD-10-CM

## 2015-07-17 DIAGNOSIS — I10 Essential (primary) hypertension: Secondary | ICD-10-CM | POA: Diagnosis present

## 2015-07-17 DIAGNOSIS — N183 Chronic kidney disease, stage 3 unspecified: Secondary | ICD-10-CM | POA: Diagnosis present

## 2015-07-17 DIAGNOSIS — R8290 Unspecified abnormal findings in urine: Secondary | ICD-10-CM | POA: Diagnosis present

## 2015-07-17 DIAGNOSIS — M79671 Pain in right foot: Secondary | ICD-10-CM

## 2015-07-17 DIAGNOSIS — I739 Peripheral vascular disease, unspecified: Secondary | ICD-10-CM | POA: Diagnosis present

## 2015-07-17 DIAGNOSIS — R609 Edema, unspecified: Secondary | ICD-10-CM

## 2015-07-17 DIAGNOSIS — R52 Pain, unspecified: Secondary | ICD-10-CM

## 2015-07-17 DIAGNOSIS — S82002D Unspecified fracture of left patella, subsequent encounter for closed fracture with routine healing: Secondary | ICD-10-CM

## 2015-07-17 DIAGNOSIS — E118 Type 2 diabetes mellitus with unspecified complications: Secondary | ICD-10-CM | POA: Diagnosis present

## 2015-07-17 DIAGNOSIS — M25569 Pain in unspecified knee: Secondary | ICD-10-CM

## 2015-07-17 HISTORY — DX: Unspecified mononeuropathy of bilateral lower limbs: G57.93

## 2015-07-17 HISTORY — DX: Unspecified glaucoma: H40.9

## 2015-07-17 HISTORY — DX: Heart failure, unspecified: I50.9

## 2015-07-17 HISTORY — DX: Gout, unspecified: M10.9

## 2015-07-17 HISTORY — DX: Acute kidney failure, unspecified: N17.9

## 2015-07-17 HISTORY — DX: Urinary tract infection, site not specified: N39.0

## 2015-07-17 LAB — URINALYSIS, ROUTINE W REFLEX MICROSCOPIC
BILIRUBIN URINE: NEGATIVE
Glucose, UA: NEGATIVE mg/dL
Ketones, ur: NEGATIVE mg/dL
NITRITE: NEGATIVE
SPECIFIC GRAVITY, URINE: 1.015 (ref 1.005–1.030)
pH: 5 (ref 5.0–8.0)

## 2015-07-17 LAB — URINE MICROSCOPIC-ADD ON

## 2015-07-17 LAB — CBC WITH DIFFERENTIAL/PLATELET
Basophils Absolute: 0 10*3/uL (ref 0.0–0.1)
Basophils Relative: 0 %
EOS PCT: 2 %
Eosinophils Absolute: 0.1 10*3/uL (ref 0.0–0.7)
HCT: 31.1 % — ABNORMAL LOW (ref 39.0–52.0)
Hemoglobin: 9.9 g/dL — ABNORMAL LOW (ref 13.0–17.0)
LYMPHS ABS: 2.1 10*3/uL (ref 0.7–4.0)
LYMPHS PCT: 26 %
MCH: 26.2 pg (ref 26.0–34.0)
MCHC: 31.8 g/dL (ref 30.0–36.0)
MCV: 82.3 fL (ref 78.0–100.0)
MONO ABS: 0.6 10*3/uL (ref 0.1–1.0)
Monocytes Relative: 7 %
Neutro Abs: 5.4 10*3/uL (ref 1.7–7.7)
Neutrophils Relative %: 65 %
PLATELETS: 270 10*3/uL (ref 150–400)
RBC: 3.78 MIL/uL — ABNORMAL LOW (ref 4.22–5.81)
RDW: 12.9 % (ref 11.5–15.5)
WBC: 8.3 10*3/uL (ref 4.0–10.5)

## 2015-07-17 LAB — COMPREHENSIVE METABOLIC PANEL
ALT: 76 U/L — AB (ref 17–63)
AST: 65 U/L — ABNORMAL HIGH (ref 15–41)
Albumin: 2.7 g/dL — ABNORMAL LOW (ref 3.5–5.0)
Alkaline Phosphatase: 63 U/L (ref 38–126)
Anion gap: 16 — ABNORMAL HIGH (ref 5–15)
BILIRUBIN TOTAL: 0.4 mg/dL (ref 0.3–1.2)
BUN: 79 mg/dL — ABNORMAL HIGH (ref 6–20)
CHLORIDE: 102 mmol/L (ref 101–111)
CO2: 22 mmol/L (ref 22–32)
CREATININE: 2.74 mg/dL — AB (ref 0.61–1.24)
Calcium: 9.8 mg/dL (ref 8.9–10.3)
GFR, EST AFRICAN AMERICAN: 28 mL/min — AB (ref >60)
GFR, EST NON AFRICAN AMERICAN: 24 mL/min — AB (ref >60)
Glucose, Bld: 193 mg/dL — ABNORMAL HIGH (ref 65–99)
POTASSIUM: 4.4 mmol/L (ref 3.5–5.1)
Sodium: 140 mmol/L (ref 135–145)
TOTAL PROTEIN: 6.9 g/dL (ref 6.5–8.1)

## 2015-07-17 LAB — URIC ACID: URIC ACID, SERUM: 5.1 mg/dL (ref 4.4–7.6)

## 2015-07-17 LAB — GLUCOSE, CAPILLARY
GLUCOSE-CAPILLARY: 145 mg/dL — AB (ref 65–99)
GLUCOSE-CAPILLARY: 168 mg/dL — AB (ref 65–99)
Glucose-Capillary: 150 mg/dL — ABNORMAL HIGH (ref 65–99)

## 2015-07-17 LAB — BRAIN NATRIURETIC PEPTIDE: B NATRIURETIC PEPTIDE 5: 78.1 pg/mL (ref 0.0–100.0)

## 2015-07-17 MED ORDER — OXYCODONE-ACETAMINOPHEN 5-325 MG PO TABS
2.0000 | ORAL_TABLET | Freq: Once | ORAL | Status: AC
  Administered 2015-07-17: 2 via ORAL
  Filled 2015-07-17: qty 2

## 2015-07-17 MED ORDER — ACETAMINOPHEN 650 MG RE SUPP: 650.0000 mg | Freq: Four times a day (QID) | RECTAL | Status: DC | PRN

## 2015-07-17 MED ORDER — INSULIN ASPART 100 UNIT/ML ~~LOC~~ SOLN
0.0000 [IU] | Freq: Three times a day (TID) | SUBCUTANEOUS | Status: DC
  Administered 2015-07-17 – 2015-07-18 (×4): 3 [IU] via SUBCUTANEOUS
  Administered 2015-07-18: 11 [IU] via SUBCUTANEOUS
  Administered 2015-07-19 (×2): 7 [IU] via SUBCUTANEOUS

## 2015-07-17 MED ORDER — SODIUM CHLORIDE 0.45 % IV SOLN
INTRAVENOUS | Status: DC
  Administered 2015-07-17 (×2): via INTRAVENOUS

## 2015-07-17 MED ORDER — DEXTROSE 5 % IV SOLN
1.0000 g | INTRAVENOUS | Status: DC
  Administered 2015-07-17: 1 g via INTRAVENOUS
  Filled 2015-07-17: qty 10

## 2015-07-17 MED ORDER — ACETAMINOPHEN 325 MG PO TABS: 650.0000 mg | ORAL_TABLET | Freq: Four times a day (QID) | ORAL | Status: DC | PRN

## 2015-07-17 MED ORDER — COLCHICINE 0.6 MG PO TABS
0.6000 mg | ORAL_TABLET | Freq: Once | ORAL | Status: AC
  Administered 2015-07-17: 0.6 mg via ORAL
  Filled 2015-07-17: qty 1

## 2015-07-17 MED ORDER — FEBUXOSTAT 40 MG PO TABS
40.0000 mg | ORAL_TABLET | Freq: Every day | ORAL | Status: DC
  Administered 2015-07-17 – 2015-07-19 (×3): 40 mg via ORAL
  Filled 2015-07-17 (×3): qty 1

## 2015-07-17 MED ORDER — COLCHICINE 0.6 MG PO TABS
1.2000 mg | ORAL_TABLET | Freq: Once | ORAL | Status: AC
  Administered 2015-07-17: 1.2 mg via ORAL
  Filled 2015-07-17: qty 2

## 2015-07-17 MED ORDER — LATANOPROST 0.005 % OP SOLN
1.0000 [drp] | Freq: Every day | OPHTHALMIC | Status: DC
  Administered 2015-07-17 – 2015-07-18 (×2): 1 [drp] via OPHTHALMIC
  Filled 2015-07-17: qty 2.5

## 2015-07-17 MED ORDER — CARVEDILOL 3.125 MG PO TABS
3.1250 mg | ORAL_TABLET | Freq: Every day | ORAL | Status: DC
  Administered 2015-07-17 – 2015-07-19 (×3): 3.125 mg via ORAL
  Filled 2015-07-17 (×4): qty 1

## 2015-07-17 MED ORDER — BRINZOLAMIDE 1 % OP SUSP
1.0000 [drp] | Freq: Two times a day (BID) | OPHTHALMIC | Status: DC
  Administered 2015-07-17 – 2015-07-19 (×5): 1 [drp] via OPHTHALMIC
  Filled 2015-07-17: qty 10

## 2015-07-17 MED ORDER — PANTOPRAZOLE SODIUM 40 MG PO TBEC
40.0000 mg | DELAYED_RELEASE_TABLET | Freq: Two times a day (BID) | ORAL | Status: DC
  Administered 2015-07-17 – 2015-07-19 (×5): 40 mg via ORAL
  Filled 2015-07-17 (×5): qty 1

## 2015-07-17 MED ORDER — ASPIRIN EC 81 MG PO TBEC
81.0000 mg | DELAYED_RELEASE_TABLET | Freq: Every day | ORAL | Status: DC
  Administered 2015-07-17 – 2015-07-19 (×3): 81 mg via ORAL
  Filled 2015-07-17 (×3): qty 1

## 2015-07-17 MED ORDER — ROSUVASTATIN CALCIUM 20 MG PO TABS
20.0000 mg | ORAL_TABLET | Freq: Every day | ORAL | Status: DC
  Administered 2015-07-17 – 2015-07-19 (×3): 20 mg via ORAL
  Filled 2015-07-17 (×3): qty 1

## 2015-07-17 MED ORDER — ENOXAPARIN SODIUM 40 MG/0.4ML ~~LOC~~ SOLN
40.0000 mg | SUBCUTANEOUS | Status: DC
  Administered 2015-07-17 – 2015-07-19 (×3): 40 mg via SUBCUTANEOUS
  Filled 2015-07-17 (×3): qty 0.4

## 2015-07-17 MED ORDER — GABAPENTIN 300 MG PO CAPS
600.0000 mg | ORAL_CAPSULE | Freq: Three times a day (TID) | ORAL | Status: DC
  Administered 2015-07-17 – 2015-07-19 (×7): 600 mg via ORAL
  Filled 2015-07-17 (×8): qty 2

## 2015-07-17 MED ORDER — INSULIN ASPART 100 UNIT/ML ~~LOC~~ SOLN: 0.0000 [IU] | Freq: Every day | SUBCUTANEOUS | Status: DC

## 2015-07-17 MED ORDER — AMLODIPINE BESYLATE 5 MG PO TABS
5.0000 mg | ORAL_TABLET | Freq: Every day | ORAL | Status: DC
  Administered 2015-07-17 – 2015-07-19 (×3): 5 mg via ORAL
  Filled 2015-07-17 (×3): qty 1

## 2015-07-17 MED ORDER — OXYCODONE HCL 5 MG PO TABS
5.0000 mg | ORAL_TABLET | ORAL | Status: DC | PRN
  Administered 2015-07-17 – 2015-07-18 (×4): 5 mg via ORAL
  Filled 2015-07-17 (×4): qty 1

## 2015-07-17 MED ORDER — PENTOXIFYLLINE ER 400 MG PO TBCR
400.0000 mg | EXTENDED_RELEASE_TABLET | Freq: Two times a day (BID) | ORAL | Status: DC
  Administered 2015-07-17 – 2015-07-19 (×5): 400 mg via ORAL
  Filled 2015-07-17 (×7): qty 1

## 2015-07-17 MED ORDER — TIMOLOL MALEATE 0.5 % OP SOLN
1.0000 [drp] | Freq: Two times a day (BID) | OPHTHALMIC | Status: DC
  Administered 2015-07-17 – 2015-07-19 (×5): 1 [drp] via OPHTHALMIC
  Filled 2015-07-17: qty 5

## 2015-07-17 MED ORDER — PREDNISOLONE ACETATE 1 % OP SUSP
1.0000 [drp] | Freq: Every day | OPHTHALMIC | Status: DC
  Administered 2015-07-17 – 2015-07-19 (×3): 1 [drp] via OPHTHALMIC
  Filled 2015-07-17: qty 1

## 2015-07-17 MED ORDER — MORPHINE SULFATE (PF) 2 MG/ML IV SOLN
2.0000 mg | INTRAVENOUS | Status: DC | PRN
  Filled 2015-07-17: qty 1

## 2015-07-17 NOTE — Progress Notes (Signed)
Left knee ok No change fracture alignment Right knee trace effusion - not warm doesn't look like gout Needs ace wrap on left knee Follow up 2 weeks

## 2015-07-17 NOTE — ED Notes (Signed)
Attempted report x1. 

## 2015-07-17 NOTE — H&P (Signed)
[facility] History and Physical  Alexander Hubbard ZOX:[id number] DOB: [birth year] DOA: 07/17/2015  Referring physician: Dr Rubin Payor -[facility] PCP: Leo Grosser, MD   Chief Complaint: Multiple joint pain  HPI: Alexander Hubbard is a 60 y.o. male  Right hand knee and foot pain. Started approximately 3 days ago. Patient states that this is his usual location for his gout attacks. Pain is constant and getting worse. Worse with any kind of touch her ambulation.  Patient also complaining of left knee pain. This overall though is somewhat improved from his initial injury. Patient fell wall doing a stress test on 07/13/2015 which led to a patella fracture. Patient is followed by Dr. August Saucer orthopedics.  Patient was given Percocet after his kneecap fracture which is been using for his gout pain with inadequate improvement.  Denies any fevers, nausea, vomiting, chest pain, shortness of breath, palpitations.   Review of Systems:  Constitutional:  No weight loss, night sweats, Fevers, chills, fatigue.  HEENT:  No headaches, Difficulty swallowing,Tooth/dental problems,Sore throat, Cardio-vascular:  No chest pain, Orthopnea, PND, swelling in lower extremities, anasarca, dizziness, palpitations  GI:  No heartburn, indigestion, abdominal pain, nausea, vomiting, diarrhea, change in bowel habits, loss of appetite  Resp:   No shortness of breath with exertion or at rest. No excess mucus, no productive cough, No non-productive cough, No coughing up of blood.No change in color of mucus.No wheezing.No chest wall deformity  Skin: Per HPI GU:  no dysuria, change in color of urine, no urgency or frequency. No flank pain.  Musculoskeletal:  Per HPI Psych:  No change in mood or affect. No depression or anxiety. No memory loss.  Neuro:  No change in sensation, unilateral strength, or cognitive abilities  All other systems were reviewed and are negative.  Past Medical History  Diagnosis Date  . Diabetes  mellitus   . Hypertension   . Obesity   . Sleep apnea   . Gouty arthritis   . Varicose veins    Past Surgical History  Procedure Laterality Date  . Eye surgery    . Knee surgery    . Gastric bypass     Social History:  reports that he has never smoked. He has never used smokeless tobacco. He reports that he does not drink alcohol or use illicit drugs.  Allergies  Allergen Reactions  . Penicillins Swelling and Rash  . Amoxicillin Swelling and Rash  . Niacin Itching and Other (See Comments)    Other reaction(s): Flushing (ALLERGY/intolerance)  . Penicillin G Rash    Family History  Problem Relation Age of Onset  . Hypertension Sister   . Diabetes Mellitus II Sister      Prior to Admission medications   Medication Sig Start Date End Date Taking? Authorizing Provider  amLODipine (NORVASC) 5 MG tablet Take 1 tablet (5 mg total) by mouth daily. 08/04/14  Yes Ripudeep Jenna Luo, MD  aspirin 81 MG tablet Take 81 mg by mouth daily.   Yes Historical Provider, MD  brinzolamide (AZOPT) 1 % ophthalmic suspension Place 1 drop into the left eye 2 (two) times daily.    Yes Historical Provider, MD  carvedilol (COREG) 3.125 MG tablet Take 3.125 mg by mouth daily.    Yes Historical Provider, MD  ferrous sulfate 325 (65 FE) MG tablet Take 325 mg by mouth daily with breakfast.   Yes Historical Provider, MD  gabapentin (NEURONTIN) 300 MG capsule TAKE 2 CAPSULES BY MOUTH 3 TIMES A DAY 07/16/15  Yes Donita Brooks,  MD  latanoprost (XALATAN) 0.005 % ophthalmic solution Place 1 drop into the left eye at bedtime.    Yes Historical Provider, MD  linagliptin (TRADJENTA) 5 MG TABS tablet Take 5 mg by mouth daily.   Yes Historical Provider, MD  oxyCODONE (OXY IR/ROXICODONE) 5 MG immediate release tablet Take 5 mg by mouth every 6 (six) hours as needed for severe pain.   Yes Historical Provider, MD  pantoprazole (PROTONIX) 40 MG tablet TAKE 1 TABLET BY MOUTH TWICE A DAY 03/13/15  Yes Salley Scarlet, MD    pentoxifylline (TRENTAL) 400 MG CR tablet Take 400 mg by mouth 2 (two) times daily.   Yes Historical Provider, MD  prednisoLONE acetate (PRED FORTE) 1 % ophthalmic suspension Place 1 drop into the right eye at bedtime.    Yes Historical Provider, MD  rosuvastatin (CRESTOR) 20 MG tablet TAKE 1 TABLET BY MOUTH DAILY 05/17/15  Yes Donita Brooks, MD  timolol (TIMOPTIC) 0.5 % ophthalmic solution Place 1 drop into both eyes 2 (two) times daily.   Yes Historical Provider, MD  ULORIC 40 MG tablet Take 40 mg by mouth daily. 12/30/14  Yes Historical Provider, MD  valsartan (DIOVAN) 40 MG tablet Take 20 mg by mouth daily.   Yes Historical Provider, MD  vitamin B-12 (CYANOCOBALAMIN) 1000 MCG tablet Take 1,000 mcg by mouth. (Chewable tablet)   Yes Historical Provider, MD  ammonium lactate (LAC-HYDRIN) 12 % lotion Apply 1 application topically 2 (two) times daily as needed (legs). Patient not taking: Reported on 07/17/2015 08/30/13   Delories Heinz, DPM  diazepam (VALIUM) 2 MG tablet Take 1 tablet (2 mg total) by mouth every 12 (twelve) hours as needed for muscle spasms. Patient not taking: Reported on 07/17/2015 09/21/14   Dorena Bodo, PA-C  glucose blood (ACCU-CHEK SMARTVIEW) test strip 1 each by Other route See admin instructions. Check blood sugar 3 times daily.    Historical Provider, MD  LINZESS 145 MCG CAPS capsule TAKE 1 CAPSULE BY MOUTH DAILY. Patient not taking: Reported on 07/17/2015 05/23/15   Donita Brooks, MD  oxyCODONE-acetaminophen (PERCOCET/ROXICET) 5-325 MG tablet 1 to 2 tabs PO q6hrs  PRN for pain Patient not taking: Reported on 07/17/2015 07/06/15   Joni Reining Pisciotta, PA-C  traMADol (ULTRAM) 50 MG tablet Take 1 tablet (50 mg total) by mouth every 6 (six) hours as needed. Patient not taking: Reported on 07/17/2015 11/13/14   Junius Finner, PA-C  UNABLE TO FIND Dispense one pair of Diabetic Shoes. ICD E11.49, G62.9, B35.1, R60.9 06/12/14   Donita Brooks, MD   Physical Exam: Filed Vitals:    07/17/15 0530 07/17/15 0600 07/17/15 0700 07/17/15 0830  BP: 127/66 128/67 168/79 161/88  Pulse: 68 66 69 74  Temp:      TempSrc:      Resp:  16 16 16   Height:      Weight:      SpO2: 96% 96% 97% 100%    Wt Readings from Last 3 Encounters:  07/17/15 117.935 kg (260 lb)  07/06/15 119.75 kg (264 lb)  07/06/15 122.018 kg (269 lb)    General:  Appears calm and comfortable Eyes:  PERRL, EOMI, normal lids, iris ENT:  grossly normal hearing, lips & tongue Neck:  no LAD, masses or thyromegaly Cardiovascular:  RRR, no m/r/g. 1+ bilateral lower extremity edema Respiratory:  CTA bilaterally, no w/r/r. Normal respiratory effort. Abdomen:  soft, ntnd Skin:  no rash or induration seen on limited exam Musculoskeletal:  Swelling of  the right hand dominant on the dorsum of the hand along the MCPs, minimal swelling of the right knee without overt effusion, swelling of the medial aspect of the right foot with surrounding erythema. Dorsalis pedis and PT pulses minimal in the lower extremity bilaterally. Significant venous stasis changes in both lower extremities. Psychiatric:  grossly normal mood and affect, speech fluent and appropriate Neurologic:  CN 2-12 grossly intact, moves all extremities in coordinated fashion.          Labs on Admission:  Basic Metabolic Panel:  Recent Labs Lab 07/17/15 0409  NA 140  K 4.4  CL 102  CO2 22  GLUCOSE 193*  BUN 79*  CREATININE 2.74*  CALCIUM 9.8   Liver Function Tests:  Recent Labs Lab 07/17/15 0409  AST 65*  ALT 76*  ALKPHOS 63  BILITOT 0.4  PROT 6.9  ALBUMIN 2.7*   No results for input(s): LIPASE, AMYLASE in the last 168 hours. No results for input(s): AMMONIA in the last 168 hours. CBC:  Recent Labs Lab 07/17/15 0409  WBC 8.3  NEUTROABS 5.4  HGB 9.9*  HCT 31.1*  MCV 82.3  PLT 270   Cardiac Enzymes: No results for input(s): CKTOTAL, CKMB, CKMBINDEX, TROPONINI in the last 168 hours.  BNP (last 3 results)  Recent  Labs  07/17/15 0409  BNP 78.1    ProBNP (last 3 results) No results for input(s): PROBNP in the last 8760 hours.   CREATININE: 2.74 mg/dL ABNORMAL (16/10/96 0454) Estimated creatinine clearance - 37.4 mL/min  CBG: No results for input(s): GLUCAP in the last 168 hours.  Radiological Exams on Admission: Dg Hand Complete Right  07/17/2015  CLINICAL DATA:  Swelling, pain, and redness in the right hand for 2 days. No injury. EXAM: RIGHT HAND - COMPLETE 3+ VIEW COMPARISON:  None. FINDINGS: Degenerative changes in the interphalangeal joints of the right hand. Dorsal soft tissue swelling. No evidence of acute fracture or dislocation. No focal bone lesion or bone destruction. Vascular calcifications. Calcification in the triangular fibrocartilage. No radiopaque soft tissue foreign bodies or soft tissue gas collections. IMPRESSION: Degenerative changes in the right hand. Soft tissue swelling. No acute bony abnormalities. Electronically Signed   By: Burman Nieves M.D.   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 Baseline 1.4. Suspect this is multifactorial including mild dehydration, progressive renal injury from diabetes, hypertension. Followed by nephrology at [facility]. - Med surge - Observation - IVF - BMET in am - hold valsartan  Gout flare: multiple joint involvement (R hand, R knee and R foot). Typical flare for pt. On Uloric. CrCl noted at 37. Pt also a diabetic. Uric acid 5.1. Recently 7.8.  - Colchicine 1.2 followed by 0.6. (renal function adequate for dosing) - Prednisone if not improving  Abnormal UA: UA appears grossly abnormal but asymptomatic. Favor no ABX, IVF at this time. No suprapubic tenderness or CVA tenderness. - IVF -  UCX  L Knee fracture: improving. followed by Dr August Saucer. Pt sustained while performing cardiac stress test on 07/13/15. Pt was set to have f/u appt on 07/17/15. Called and discussed w/ Dr. Otelia Sergeant of [facility] ortho.  - Ortho to see while inpt  PVD: - continue Trental  DM: on orals only.  Glucose 193. - A1c - resistant SSI  HTN: - continue norvasc, coreg - hold valsartan - hydralazine prn  Neuropathy: - continue neurontin  HLD: - continue crestor  GERd: - contineu protonix  Code Status: FULL  DVT Prophylaxis: Lovenox Family Communication: wife Disposition Plan: Pending Improvement    Dellie Piasecki J, MD Family Medicine [facility] [email or web address] Password [email or web address]

## 2015-07-17 NOTE — ED Provider Notes (Signed)
CSN: [id number]     Arrival date & time 07/16/15  2332 History  By signing my name below, I, Marisue Humble, attest that this documentation has been prepared under the direction and in the presence of Gilda Crease, MD . Electronically Signed: Marisue Humble, Scribe. 07/17/2015. 4:08 AM.   Chief Complaint  Patient presents with  . Gout   The history is provided by the patient. No language interpreter was used.   HPI Comments:  Alexander Hubbard is a 60 y.o. male with PMHx of DM, HTN, and gout who presents to the Emergency Department complaining of gradual onset moderate right knee pain and swelling in right hand and both feet. Pt reports associated dry mouth. Pt states he fell during a stress test three days ago and noticed pain in his right knee when he got home. No alleviating factors noted. He reports prior episodes of gout in right leg and right hand. Pt notes he cannot walk secondary to pain. Pt denies difficulty breathing or chest pain.  Past Medical History  Diagnosis Date  . Diabetes mellitus   . Hypertension   . Obesity   . Sleep apnea   . Gouty arthritis   . Varicose veins    Past Surgical History  Procedure Laterality Date  . Eye surgery    . Knee surgery    . Gastric bypass     Family History  Problem Relation Age of Onset  . Hypertension Sister   . Diabetes Mellitus II Sister    Social History  Substance Use Topics  . Smoking status: Never Smoker   . Smokeless tobacco: Never Used  . Alcohol Use: No    Review of Systems  Respiratory: Negative for shortness of breath.   Cardiovascular: Positive for leg swelling. Negative for chest pain.  Musculoskeletal: Positive for joint swelling and arthralgias.  All other systems reviewed and are negative.   Allergies  Penicillins; Amoxicillin; Niacin; and Penicillin g  Home Medications   Prior to Admission medications   Medication Sig Start Date End Date Taking? Authorizing Provider  amLODipine (NORVASC) 5  MG tablet Take 1 tablet (5 mg total) by mouth daily. 08/04/14  Yes Ripudeep Jenna Luo, MD  aspirin 81 MG tablet Take 81 mg by mouth daily.   Yes Historical Provider, MD  brinzolamide (AZOPT) 1 % ophthalmic suspension Place 1 drop into the left eye 2 (two) times daily.    Yes Historical Provider, MD  carvedilol (COREG) 3.125 MG tablet Take 3.125 mg by mouth daily.    Yes Historical Provider, MD  ferrous sulfate 325 (65 FE) MG tablet Take 325 mg by mouth daily with breakfast.   Yes Historical Provider, MD  gabapentin (NEURONTIN) 300 MG capsule TAKE 2 CAPSULES BY MOUTH 3 TIMES A DAY 07/16/15  Yes Donita Brooks, MD  latanoprost (XALATAN) 0.005 % ophthalmic solution Place 1 drop into the left eye at bedtime.    Yes Historical Provider, MD  linagliptin (TRADJENTA) 5 MG TABS tablet Take 5 mg by mouth daily.   Yes Historical Provider, MD  oxyCODONE (OXY IR/ROXICODONE) 5 MG immediate release tablet Take 5 mg by mouth every 6 (six) hours as needed for severe pain.   Yes Historical Provider, MD  pantoprazole (PROTONIX) 40 MG tablet TAKE 1 TABLET BY MOUTH TWICE A DAY 03/13/15  Yes Salley Scarlet, MD  pentoxifylline (TRENTAL) 400 MG CR tablet Take 400 mg by mouth 2 (two) times daily.   Yes Historical Provider, MD  prednisoLONE acetate (  PRED FORTE) 1 % ophthalmic suspension Place 1 drop into the right eye at bedtime.    Yes Historical Provider, MD  rosuvastatin (CRESTOR) 20 MG tablet TAKE 1 TABLET BY MOUTH DAILY 05/17/15  Yes Donita Brooks, MD  timolol (TIMOPTIC) 0.5 % ophthalmic solution Place 1 drop into both eyes 2 (two) times daily.   Yes Historical Provider, MD  ULORIC 40 MG tablet Take 40 mg by mouth daily. 12/30/14  Yes Historical Provider, MD  valsartan (DIOVAN) 40 MG tablet Take 20 mg by mouth daily.   Yes Historical Provider, MD  vitamin B-12 (CYANOCOBALAMIN) 1000 MCG tablet Take 1,000 mcg by mouth. (Chewable tablet)   Yes Historical Provider, MD  ammonium lactate (LAC-HYDRIN) 12 % lotion Apply 1  application topically 2 (two) times daily as needed (legs). Patient not taking: Reported on 07/17/2015 08/30/13   Delories Heinz, DPM  diazepam (VALIUM) 2 MG tablet Take 1 tablet (2 mg total) by mouth every 12 (twelve) hours as needed for muscle spasms. Patient not taking: Reported on 07/17/2015 09/21/14   Dorena Bodo, PA-C  glucose blood (ACCU-CHEK SMARTVIEW) test strip 1 each by Other route See admin instructions. Check blood sugar 3 times daily.    Historical Provider, MD  LINZESS 145 MCG CAPS capsule TAKE 1 CAPSULE BY MOUTH DAILY. Patient not taking: Reported on 07/17/2015 05/23/15   Donita Brooks, MD  oxyCODONE-acetaminophen (PERCOCET/ROXICET) 5-325 MG tablet 1 to 2 tabs PO q6hrs  PRN for pain Patient not taking: Reported on 07/17/2015 07/06/15   Joni Reining Pisciotta, PA-C  traMADol (ULTRAM) 50 MG tablet Take 1 tablet (50 mg total) by mouth every 6 (six) hours as needed. Patient not taking: Reported on 07/17/2015 11/13/14   Junius Finner, PA-C  UNABLE TO FIND Dispense one pair of Diabetic Shoes. ICD E11.49, G62.9, B35.1, R60.9 06/12/14   Donita Brooks, MD   BP 168/79 mmHg  Pulse 69  Temp(Src) 98.9 F (37.2 C) (Oral)  Resp 16  Ht  (1.778 m)  Wt 260 lb (117.935 kg)  BMI 37.31 kg/m2  SpO2 97% Physical Exam  Constitutional: He is oriented to person, place, and time. He appears well-developed and well-nourished. No distress.  HENT:  Head: Normocephalic and atraumatic.  Right Ear: Hearing normal.  Left Ear: Hearing normal.  Nose: Nose normal.  Mouth/Throat: Oropharynx is clear and moist and mucous membranes are normal.  Eyes: Conjunctivae and EOM are normal. Pupils are equal, round, and reactive to light.  Neck: Normal range of motion. Neck supple.  Cardiovascular: Regular rhythm, S1 normal and S2 normal.  Exam reveals no gallop and no friction rub.   No murmur heard. Pulmonary/Chest: Effort normal and breath sounds normal. No respiratory distress. He exhibits no tenderness.   Abdominal: Soft. Normal appearance and bowel sounds are normal. There is no hepatosplenomegaly. There is no tenderness. There is no rebound, no guarding, no tenderness at McBurney's point and negative Murphy's sign. No hernia.  Musculoskeletal: He exhibits edema.  Swelling, erythema, and TTP to 2nd, 3rd, and 4th MCP joints of right hand; painful ROM; BL symmetrical  LE edema with chronic stasis skin changes.  Neurological: He is alert and oriented to person, place, and time. He has normal strength. No cranial nerve deficit or sensory deficit. Coordination normal. GCS eye subscore is 4. GCS verbal subscore is 5. GCS motor subscore is 6.  Skin: Skin is warm, dry and intact. No cyanosis.  Psychiatric: He has a normal mood and affect. His speech is  normal and behavior is normal. Thought content normal.  Nursing note and vitals reviewed.   ED Course  Procedures  DIAGNOSTIC STUDIES: Oxygen Saturation is 96% on RA, normal by my interpretation.    COORDINATION OF CARE: 3:39 AM Will order lab work and administer pain medication. Discussed treatment plan with pt at bedside and pt agreed to plan.  Labs Review Labs Reviewed  CBC WITH DIFFERENTIAL/PLATELET - Abnormal; Notable for the following:    RBC 3.78 (*)    Hemoglobin 9.9 (*)    HCT 31.1 (*)    All other components within normal limits  COMPREHENSIVE METABOLIC PANEL - Abnormal; Notable for the following:    Glucose, Bld 193 (*)    BUN 79 (*)    Creatinine, Ser 2.74 (*)    Albumin 2.7 (*)    AST 65 (*)    ALT 76 (*)    GFR calc non Af Amer 24 (*)    GFR calc Af Amer 28 (*)    Anion gap 16 (*)    All other components within normal limits  URINALYSIS, ROUTINE W REFLEX [facility]CROSCOPIC ([facility]) - Abnormal; Notable for the following:    APPearance CLOUDY (*)    Hgb urine dipstick TRACE (*)    Protein, ur >300 (*)    Leukocytes, UA SMALL (*)    All other components within normal limits  URINE MICROSCOPIC-ADD ON - Abnormal; Notable for  the following:    Squamous Epithelial / LPF 0-5 (*)    Bacteria, UA MANY (*)    Casts GRANULAR CAST (*)    All other components within normal limits  BRAIN NATRIURETIC PEPTIDE  URIC ACID    Imaging Review Dg Hand Complete Right  07/17/2015  CLINICAL DATA:  Swelling, pain, and redness in the right hand for 2 days. No injury. EXAM: RIGHT HAND - COMPLETE 3+ VIEW COMPARISON:  None. FINDINGS: Degenerative changes in the interphalangeal joints of the right hand. Dorsal soft tissue swelling. No evidence of acute fracture or dislocation. No focal bone lesion or bone destruction. Vascular calcifications. Calcification in the triangular fibrocartilage. No radiopaque soft tissue foreign bodies or soft tissue gas collections. IMPRESSION: Degenerative changes in the right hand. Soft tissue swelling. No acute bony abnormalities. Electronically Signed   By: Burman Nieves M.D.   On: 07/17/2015 00:57   I have personally reviewed and evaluated these images and lab results as part of my medical decision-making.   EKG Interpretation None      MDM   Final diagnoses:  UTI (lower urinary tract infection)  Acute gout of right hand, unspecified cause  AKI (acute kidney injury) (HCC)    Patient presents to the ER for evaluation of pain, swelling of his right hand and swelling of both his legs. Patient reports that he does have a history of gout. Patient has erythema, warmth, swelling and painful range of motion of the hand, predominantly at the second, third, fourth MCP joints of the right hand. This could be consistent with gout. Would need to monitor to rule out possibility of cellulitis. I do not suspect septic joint, area of erythema covers multiple joints.  Patient did have blood work performed because he has lower leg edema as well. He reports that he has previously been on diuretics, but these were stopped sometime ago because of kidney issues. Patient's creatinine is significantly elevated consistent  with acute kidney injury. In addition, he does have evidence of urinary tract infection. Will initiate Rocephin. Based  on patient's urinary tract infection, acute kidney injury will have hospitalist admit.  I personally performed the services described in this documentation, which was scribed in my presence. The recorded information has been reviewed and is accurate.    Gilda Crease, MD 07/17/15 [phone]

## 2015-07-17 NOTE — Care Management Note (Signed)
Case Management Note  Patient Details  Name: Alexander Hubbard MRN: [id number].       Date of Birth: 1956/03/24  Subjective/Objective:               Admitted  with acute renal insufficiency. Hx of gout, gastric bypass. Independent with ADL's. DME: crutches, walker. PCP: Lynnea Ferrier.   Action/Plan: Return to home when medically stable. CM to f/u with d/c needs.  Expected Discharge Date:                  Expected Discharge Plan:  Home/Self Care  In-House Referral:     Discharge planning Services  CM Consult  Post Acute Care Choice:    Choice offered to:     DME Arranged:  Crutches, Walker rolling (own) DME Agency:     HH Arranged:    HH Agency:     Status of Service:  In process, will continue to follow  Medicare Important Message Given:    Date Medicare IM Given:    Medicare IM give by:    Date Additional Medicare IM Given:    Additional Medicare Important Message give by:     If discussed at Long Length of Stay Meetings, dates discussed:    Additional Comments: Maxxwell Edgett (Spouse) [phone], Yidel Teuscher (Daughter) [phone]  Gae Gallop [place], [place] [phone] 07/17/2015, 10:52 AM

## 2015-07-17 NOTE — Consult Note (Signed)
60 yo male h/o gastric bypass admitted to Medicine service with acute renal insufficiency. Past history of gout. Scheduled to see Dr. August Saucer in our office tomorrow. Requested follow up while in hospital of left nondisplaced Longitudinal lateral facet fracture. I will order follow up xrays. He also is suffering from right hand and right foot warmth, pain and swelling. Describes persistent pain right knee since fall that occurred while undergoing a stress test 07/06/2015, Will ask Dr. August Saucer to see while in hospital.

## 2015-07-17 NOTE — ED Notes (Signed)
Pt. reports gout arthritis flare up onset 2 days ago at right hand joints with swelling and pain , denies injury , no fever or chills.

## 2015-07-18 DIAGNOSIS — I739 Peripheral vascular disease, unspecified: Secondary | ICD-10-CM | POA: Diagnosis present

## 2015-07-18 DIAGNOSIS — M1000 Idiopathic gout, unspecified site: Secondary | ICD-10-CM

## 2015-07-18 DIAGNOSIS — Z6837 Body mass index (BMI) 37.0-37.9, adult: Secondary | ICD-10-CM | POA: Diagnosis not present

## 2015-07-18 DIAGNOSIS — N183 Chronic kidney disease, stage 3 unspecified: Secondary | ICD-10-CM | POA: Diagnosis present

## 2015-07-18 DIAGNOSIS — E785 Hyperlipidemia, unspecified: Secondary | ICD-10-CM | POA: Diagnosis present

## 2015-07-18 DIAGNOSIS — E860 Dehydration: Secondary | ICD-10-CM | POA: Diagnosis present

## 2015-07-18 DIAGNOSIS — E1122 Type 2 diabetes mellitus with diabetic chronic kidney disease: Secondary | ICD-10-CM | POA: Diagnosis present

## 2015-07-18 DIAGNOSIS — N179 Acute kidney failure, unspecified: Secondary | ICD-10-CM | POA: Diagnosis present

## 2015-07-18 DIAGNOSIS — E118 Type 2 diabetes mellitus with unspecified complications: Secondary | ICD-10-CM

## 2015-07-18 DIAGNOSIS — Z7982 Long term (current) use of aspirin: Secondary | ICD-10-CM | POA: Diagnosis not present

## 2015-07-18 DIAGNOSIS — M25569 Pain in unspecified knee: Secondary | ICD-10-CM | POA: Diagnosis present

## 2015-07-18 DIAGNOSIS — R609 Edema, unspecified: Secondary | ICD-10-CM | POA: Diagnosis not present

## 2015-07-18 DIAGNOSIS — M10061 Idiopathic gout, right knee: Secondary | ICD-10-CM | POA: Diagnosis present

## 2015-07-18 DIAGNOSIS — Z9884 Bariatric surgery status: Secondary | ICD-10-CM | POA: Diagnosis not present

## 2015-07-18 DIAGNOSIS — E1142 Type 2 diabetes mellitus with diabetic polyneuropathy: Secondary | ICD-10-CM | POA: Diagnosis present

## 2015-07-18 DIAGNOSIS — G4730 Sleep apnea, unspecified: Secondary | ICD-10-CM | POA: Diagnosis present

## 2015-07-18 DIAGNOSIS — S82002D Unspecified fracture of left patella, subsequent encounter for closed fracture with routine healing: Secondary | ICD-10-CM | POA: Diagnosis not present

## 2015-07-18 DIAGNOSIS — M10071 Idiopathic gout, right ankle and foot: Secondary | ICD-10-CM | POA: Diagnosis present

## 2015-07-18 DIAGNOSIS — Z79899 Other long term (current) drug therapy: Secondary | ICD-10-CM | POA: Diagnosis not present

## 2015-07-18 DIAGNOSIS — W19XXXD Unspecified fall, subsequent encounter: Secondary | ICD-10-CM | POA: Diagnosis present

## 2015-07-18 DIAGNOSIS — R52 Pain, unspecified: Secondary | ICD-10-CM | POA: Diagnosis not present

## 2015-07-18 DIAGNOSIS — E669 Obesity, unspecified: Secondary | ICD-10-CM | POA: Diagnosis present

## 2015-07-18 DIAGNOSIS — M10041 Idiopathic gout, right hand: Secondary | ICD-10-CM | POA: Diagnosis present

## 2015-07-18 DIAGNOSIS — K219 Gastro-esophageal reflux disease without esophagitis: Secondary | ICD-10-CM | POA: Diagnosis present

## 2015-07-18 DIAGNOSIS — Z7984 Long term (current) use of oral hypoglycemic drugs: Secondary | ICD-10-CM | POA: Diagnosis not present

## 2015-07-18 DIAGNOSIS — I872 Venous insufficiency (chronic) (peripheral): Secondary | ICD-10-CM | POA: Diagnosis present

## 2015-07-18 DIAGNOSIS — I129 Hypertensive chronic kidney disease with stage 1 through stage 4 chronic kidney disease, or unspecified chronic kidney disease: Secondary | ICD-10-CM | POA: Diagnosis present

## 2015-07-18 LAB — CBC
HCT: 27.4 % — ABNORMAL LOW (ref 39.0–52.0)
Hemoglobin: 9.1 g/dL — ABNORMAL LOW (ref 13.0–17.0)
MCH: 27.4 pg (ref 26.0–34.0)
MCHC: 33.2 g/dL (ref 30.0–36.0)
MCV: 82.5 fL (ref 78.0–100.0)
PLATELETS: 236 10*3/uL (ref 150–400)
RBC: 3.32 MIL/uL — AB (ref 4.22–5.81)
RDW: 12.8 % (ref 11.5–15.5)
WBC: 7.3 10*3/uL (ref 4.0–10.5)

## 2015-07-18 LAB — BASIC METABOLIC PANEL
Anion gap: 8 (ref 5–15)
BUN: 71 mg/dL — AB (ref 6–20)
CO2: 25 mmol/L (ref 22–32)
CREATININE: 2.46 mg/dL — AB (ref 0.61–1.24)
Calcium: 8.5 mg/dL — ABNORMAL LOW (ref 8.9–10.3)
Chloride: 105 mmol/L (ref 101–111)
GFR, EST AFRICAN AMERICAN: 31 mL/min — AB (ref >60)
GFR, EST NON AFRICAN AMERICAN: 27 mL/min — AB (ref >60)
Glucose, Bld: 151 mg/dL — ABNORMAL HIGH (ref 65–99)
POTASSIUM: 4.1 mmol/L (ref 3.5–5.1)
SODIUM: 138 mmol/L (ref 135–145)

## 2015-07-18 LAB — URINE CULTURE

## 2015-07-18 LAB — GLUCOSE, CAPILLARY
GLUCOSE-CAPILLARY: 142 mg/dL — AB (ref 65–99)
Glucose-Capillary: 125 mg/dL — ABNORMAL HIGH (ref 65–99)
Glucose-Capillary: 262 mg/dL — ABNORMAL HIGH (ref 65–99)

## 2015-07-18 LAB — HEMOGLOBIN A1C
Hgb A1c MFr Bld: 6.5 % — ABNORMAL HIGH (ref 4.8–5.6)
MEAN PLASMA GLUCOSE: 140 mg/dL

## 2015-07-18 MED ORDER — MORPHINE SULFATE (PF) 2 MG/ML IV SOLN
2.0000 mg | Freq: Once | INTRAVENOUS | Status: AC
  Administered 2015-07-18: 2 mg via INTRAVENOUS
  Filled 2015-07-18: qty 1

## 2015-07-18 MED ORDER — CALCIUM CARBONATE-VITAMIN D 500-200 MG-UNIT PO TABS
1.0000 | ORAL_TABLET | Freq: Every day | ORAL | Status: DC
  Administered 2015-07-18: 1 via ORAL
  Filled 2015-07-18: qty 1

## 2015-07-18 MED ORDER — COLCHICINE 0.6 MG PO TABS
0.6000 mg | ORAL_TABLET | Freq: Every day | ORAL | Status: DC
  Administered 2015-07-18 – 2015-07-19 (×2): 0.6 mg via ORAL
  Filled 2015-07-18 (×2): qty 1

## 2015-07-18 MED ORDER — OXYCODONE HCL 5 MG PO TABS
5.0000 mg | ORAL_TABLET | ORAL | Status: DC | PRN
  Administered 2015-07-18: 5 mg via ORAL
  Filled 2015-07-18: qty 2

## 2015-07-18 MED ORDER — VITAMIN B-12 1000 MCG PO TABS
1000.0000 ug | ORAL_TABLET | Freq: Every day | ORAL | Status: DC
  Administered 2015-07-18 – 2015-07-19 (×2): 1000 ug via ORAL
  Filled 2015-07-18 (×2): qty 1

## 2015-07-18 MED ORDER — FERROUS SULFATE 325 (65 FE) MG PO TABS
325.0000 mg | ORAL_TABLET | Freq: Every day | ORAL | Status: DC
  Administered 2015-07-19: 325 mg via ORAL
  Filled 2015-07-18: qty 1

## 2015-07-18 MED ORDER — SODIUM CHLORIDE 0.9 % IV SOLN
INTRAVENOUS | Status: DC
Start: 2015-07-18 — End: 2015-07-19
  Administered 2015-07-18: 11:00:00 via INTRAVENOUS

## 2015-07-18 MED ORDER — CALCIUM CARBONATE-VITAMIN D 500-200 MG-UNIT PO TABS
1.0000 | ORAL_TABLET | Freq: Three times a day (TID) | ORAL | Status: DC
  Administered 2015-07-18 – 2015-07-19 (×2): 1 via ORAL
  Filled 2015-07-18 (×2): qty 1

## 2015-07-18 MED ORDER — ADULT MULTIVITAMIN W/MINERALS CH
1.0000 | ORAL_TABLET | Freq: Every day | ORAL | Status: DC
  Administered 2015-07-18 – 2015-07-19 (×2): 1 via ORAL
  Filled 2015-07-18 (×2): qty 1

## 2015-07-18 MED ORDER — PREDNISONE 50 MG PO TABS
60.0000 mg | ORAL_TABLET | Freq: Every day | ORAL | Status: DC
  Administered 2015-07-18 – 2015-07-19 (×2): 60 mg via ORAL
  Filled 2015-07-18 (×2): qty 1

## 2015-07-18 NOTE — Progress Notes (Signed)
[facility] PROGRESS NOTE  Alexander Hubbard ZOX:[id number] DOB: 24-May-1956 DOA: 07/17/2015 PCP: Leo Grosser, MD  Summary 60 y.o. male  Right hand knee and foot pain. Started approximately 3 days ago. Patient states that this is his usual location for his gout attacks. Pain is constant and getting worse. Worse with any kind of touch her ambulation.  Patient also complaining of left knee pain. This overall though is somewhat improved from his initial injury. Patient fell wall doing a stress test on 07/13/2015 which led to a patella fracture. Patient is followed by Dr. August Saucer orthopedics.  Patient was given Percocet after his kneecap fracture which is been using for his gout pain with inadequate improvement.  Assessment/Plan:  Principal Problem:   AKI (acute kidney injury) (HCC): Creatinine improved. Not back to baseline which is usually about 1.5. Will resume IV fluid. Monitor. ARB held. Active Problems:   Gout attack: Polyarticular right hand, possibly right knee. Not much improvement with colchicine. Will start prednisone. Patient reports pain and edema down the entirety of his right leg. Not typical for acute gout. Will check a venous ultrasound to rule out DVT. Also has a history of neuropathy for which he takes gabapentin.   HTN (hypertension): Controlled   HLD (hyperlipidemia)   Chronic venous insufficiency   Diabetes mellitus with complication (HCC), controlled   PVD (peripheral vascular disease) (HCC)   Abnormal urine: With proteinuria and small leukocyte esterase, but only 6-30 white cells and negative nitrites. No evidence of UTI clinically.    CKD (chronic kidney disease) stage 3, GFR 30-59 ml/min  left patellar fracture prior to admission: Appreciate orthopedics follow-up. Dr. August Saucer recommends Ace wrap. Diabetic peripheral neuropathy  Code Status:  full Family Communication:  Significant other at bedside Disposition Plan:  Home 1-2 days if creatinine back to baseline    Consultants:  orthopedics  Procedures:     Antibiotics:    HPI/Subjective:  complains of severe pain from the right knee distally to the foot. Also, leg and foot swelling. Right hand pain continues. Left knee not very painful despite fracture.   Objective: Filed Vitals:   07/17/15 2204 07/18/15 0508  BP: 121/58 111/42  Pulse: 66 64  Temp: 98.9 F (37.2 C) 98.5 F (36.9 C)  Resp: 18     Intake/Output Summary (Last 24 hours) at 07/18/15 1038 Last data filed at 07/18/15 0508  Gross per 24 hour  Intake 1656.67 ml  Output   1700 ml  Net -43.33 ml   Filed Weights   07/17/15 0008 07/17/15 1030  Weight: 117.935 kg (260 lb) 118 kg (260 lb 2.3 oz)    Exam:   General:   comfortable seated at the side of the bed. Alert and oriented.   Cardiovascular:  regular rate rhythm without murmurs gallops rubs   Respiratory: Clear to auscultation bilaterally without wheezes rhonchi or rales  Abdomen:  soft nontender nondistended   Ext:  right hand with induration, erythema and tenderness about the MCP joints particularly third. Right foot with pitting edema, brawny edema of the right lower extremity. Right knee swollen but no erythema or warmth. Decreased range of motion due to pain. Hyperpigmentation about both lower extremities. Left knee without tenderness   Basic Metabolic Panel:  Recent Labs Lab 07/17/15 0409 07/18/15 0829  NA 140 138  K 4.4 4.1  CL 102 105  CO2 22 25  GLUCOSE 193* 151*  BUN 79* 71*  CREATININE 2.74* 2.46*  CALCIUM 9.8 8.5*   Liver Function Tests:  Recent Labs Lab 07/17/15 0409  AST 65*  ALT 76*  ALKPHOS 63  BILITOT 0.4  PROT 6.9  ALBUMIN 2.7*   No results for input(s): LIPASE, AMYLASE in the last 168 hours. No results for input(s): AMMONIA in the last 168 hours. CBC:  Recent Labs Lab 07/17/15 0409 07/18/15 0829  WBC 8.3 7.3  NEUTROABS 5.4  --   HGB 9.9* 9.1*  HCT 31.1* 27.4*  MCV 82.3 82.5  PLT 270 236   Cardiac  Enzymes: No results for input(s): CKTOTAL, CKMB, CKMBINDEX, TROPONINI in the last 168 hours. BNP (last 3 results)  Recent Labs  07/17/15 0409  BNP 78.1    ProBNP (last 3 results) No results for input(s): PROBNP in the last 8760 hours.  CBG:  Recent Labs Lab 07/17/15 1230 07/17/15 1653 07/17/15 2254 07/18/15 0746  GLUCAP 150* 145* 168* 142*    No results found for this or any previous visit (from the past 240 hour(s)).   Studies: Dg Ankle Complete Right  07/17/2015  CLINICAL DATA:  Right anterior heel pain. EXAM: RIGHT ANKLE - COMPLETE 3+ VIEW COMPARISON:  None. FINDINGS: There is no evidence of fracture, dislocation, or joint effusion. There is a small plantar calcaneal spur. Degenerative joint changes of the midfoot are noted. Soft tissues are unremarkable. IMPRESSION: Small plantar calcaneal spur. Degenerative joint changes of midfoot are noted. Electronically Signed   By: Sherian Rein M.D.   On: 07/17/2015 13:40   Dg Knee Complete 4 Views Left  07/17/2015  CLINICAL DATA:  Patella fracture, subsequent encounter. Evaluate for healing. Diffuse knee pain. EXAM: LEFT KNEE - COMPLETE 4+ VIEW COMPARISON:  03/04/2016 FINDINGS: longitudinal fracture again noted through the patella, best seen on the AP and oblique views, not significantly changed. No visible evidence for healing currently. Minimal displacement. Small joint effusion. No additional acute bony abnormality. Vascular calcifications noted. There is chondrocalcinosis with mild degenerative changes, most pronounced in the patellofemoral compartment. IMPRESSION: Stable appearance of the minimally displaced longitudinal left patellar fracture without visible evidence of healing. Electronically Signed   By: Charlett Nose M.D.   On: 07/17/2015 13:34   Dg Knee Complete 4 Views Right  07/17/2015  CLINICAL DATA:  Left knee pain after fall last week. EXAM: RIGHT KNEE - COMPLETE 4+ VIEW COMPARISON:  July 06, 2015. FINDINGS: There is no  evidence of fracture, dislocation, or joint effusion. Severe narrowing of patellofemoral space is noted. Spurring of superior aspect of patella is noted. Vascular calcifications are noted. Loose body is noted posteriorly in the knee joint. Chondrocalcinosis is noted medially and laterally. Osteophyte formation is seen involving the lateral joint space. 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 Mild degenerative changes are noted elsewhere. No acute abnormality seen in the right knee. Electronically Signed   By: Lupita Raider, M.D.   On: 07/17/2015 13:38   Dg Hand Complete Right  07/17/2015  CLINICAL DATA:  Swelling, pain, and redness in the right hand for 2 days. No injury. EXAM: RIGHT HAND - COMPLETE 3+ VIEW COMPARISON:  None. FINDINGS: Degenerative changes in the interphalangeal joints of the right hand. Dorsal soft tissue swelling. No evidence of acute fracture or dislocation. No focal bone lesion or bone destruction. Vascular calcifications. Calcification in the triangular fibrocartilage. No radiopaque soft tissue foreign bodies or soft tissue gas collections. IMPRESSION: Degenerative changes in the right hand. Soft tissue swelling. No acute bony abnormalities. Electronically Signed   By: Burman Nieves M.D.   On:  07/17/2015 00:57   Dg Foot Complete Right  07/17/2015  CLINICAL DATA:  Right anterior heel pain.  Tenderness all over foot. EXAM: RIGHT FOOT COMPLETE - 3+ VIEW COMPARISON:  11/03/2011 FINDINGS: Extensive soft tissue calcifications in the lower calf and around the ankle. Vascular calcifications noted in the foot. Degenerative changes at the first MTP joint and in the hindfoot. Plantar and posterior calcaneal spurs. Mild pes planus. No acute bony abnormality. Specifically, no fracture, subluxation, or dislocation. Soft tissues are intact. IMPRESSION: No acute bony abnormality. Electronically Signed   By: Charlett Nose M.D.   On: 07/17/2015 13:37     Scheduled Meds: . amLODipine  5 mg Oral Daily  . aspirin EC  81 mg Oral Daily  . brinzolamide  1 drop Left Eye BID  . carvedilol  3.125 mg Oral Daily  . enoxaparin (LOVENOX) injection  40 mg Subcutaneous Q24H  . febuxostat  40 mg Oral Daily  . gabapentin  600 mg Oral TID  . insulin aspart  0-20 Units Subcutaneous TID WC  . insulin aspart  0-5 Units Subcutaneous QHS  . latanoprost  1 drop Left Eye QHS  . pantoprazole  40 mg Oral BID  . pentoxifylline  400 mg Oral BID  . prednisoLONE acetate  1 drop Right Eye QHS  . rosuvastatin  20 mg Oral Daily  . timolol  1 drop Both Eyes BID   Continuous Infusions:   Time spent: 35 minutes  Bertrand Vowels L  [facility]  [email or web address], password [facility] 07/18/2015, 10:38 AM

## 2015-07-18 NOTE — Clinical Documentation Improvement (Addendum)
Hospitalist  (Query responses must be documented in the current medical record, not on the CDI BPA form.)  If known or able to determine and to provide the greatest specificity, please document the type of the patient's gout and any associated cause(s) or condition(s)  Possible Types or Causes:  - Idiopathic  - Primary  - Secondary  - Due to renal impairment   - Other type or cause   Please exercise your independent, professional judgment when responding. A specific answer is not anticipated or expected.   Thank You, Jerral Ralph  RN BSN CCDS [phone] [facility] [employer] [facility] [place]

## 2015-07-19 ENCOUNTER — Telehealth: Payer: Self-pay | Admitting: *Deleted

## 2015-07-19 ENCOUNTER — Ambulatory Visit (HOSPITAL_COMMUNITY): Payer: Medicaid Other

## 2015-07-19 DIAGNOSIS — R52 Pain, unspecified: Secondary | ICD-10-CM

## 2015-07-19 DIAGNOSIS — M10041 Idiopathic gout, right hand: Secondary | ICD-10-CM

## 2015-07-19 DIAGNOSIS — M109 Gout, unspecified: Secondary | ICD-10-CM | POA: Insufficient documentation

## 2015-07-19 DIAGNOSIS — R609 Edema, unspecified: Secondary | ICD-10-CM

## 2015-07-19 LAB — BASIC METABOLIC PANEL
Anion gap: 17 — ABNORMAL HIGH (ref 5–15)
BUN: 69 mg/dL — AB (ref 6–20)
CHLORIDE: 104 mmol/L (ref 101–111)
CO2: 18 mmol/L — ABNORMAL LOW (ref 22–32)
CREATININE: 2.23 mg/dL — AB (ref 0.61–1.24)
Calcium: 8.6 mg/dL — ABNORMAL LOW (ref 8.9–10.3)
GFR calc Af Amer: 35 mL/min — ABNORMAL LOW (ref >60)
GFR, EST NON AFRICAN AMERICAN: 31 mL/min — AB (ref >60)
GLUCOSE: 249 mg/dL — AB (ref 65–99)
POTASSIUM: 4 mmol/L (ref 3.5–5.1)
Sodium: 139 mmol/L (ref 135–145)

## 2015-07-19 LAB — GLUCOSE, CAPILLARY
GLUCOSE-CAPILLARY: 226 mg/dL — AB (ref 65–99)
GLUCOSE-CAPILLARY: 218 mg/dL — AB (ref 65–99)
Glucose-Capillary: 272 mg/dL — ABNORMAL HIGH (ref 65–99)

## 2015-07-19 MED ORDER — OXYCODONE HCL 5 MG PO TABS: 5.0000 mg | ORAL_TABLET | Freq: Four times a day (QID) | ORAL | Status: DC | PRN

## 2015-07-19 MED ORDER — PREDNISONE 20 MG PO TABS: 60.0000 mg | ORAL_TABLET | Freq: Every day | ORAL | Status: DC

## 2015-07-19 NOTE — Progress Notes (Signed)
Pt given discharge instructions, prescriptions, and care notes. Pt verbalized understanding AEB no further questions or concerns at this time. IV was discontinued, no redness, pain, or swelling noted at this time. Pt left the floor via wheelchair with staff in stable condition. 

## 2015-07-19 NOTE — Progress Notes (Signed)
VASCULAR LAB PRELIMINARY  PRELIMINARY  PRELIMINARY  PRELIMINARY  Right lower extremity venous duplex completed.    Preliminary report:  Right:  No evidence of DVT, superficial thrombosis, or Baker's cyst.  Lorri Fukuhara, RVT 07/19/2015, 2:08 PM

## 2015-07-19 NOTE — Discharge Summary (Signed)
Physician Discharge Summary  Tammy Ericsson ZOX:[id number] DOB: Jan 17, 1956 DOA: 07/17/2015  PCP: Leo Grosser, MD  Admit date: 07/17/2015 Discharge date: 07/19/2015  Time spent: greater than 30 minutes  Recommendations for Outpatient Follow-up:  1. Check BMET next week 2. Hold valsartan until cleared by PCP or nephrology   Discharge Diagnoses:  Principal Problem:   AKI (acute kidney injury) (HCC) Active Problems:   HTN (hypertension)   HLD (hyperlipidemia)   Chronic venous insufficiency   Diabetes mellitus with complication (HCC)   Gout, polyarticular   PVD (peripheral vascular disease) (HCC)   CKD (chronic kidney disease) stage 3, GFR 30-59 ml/min   Discharge Condition: stable  Diet recommendation: heart healthy  Filed Weights   07/17/15 0008 07/17/15 1030  Weight: 117.935 kg (260 lb) 118 kg (260 lb 2.3 oz)    History of present illness:  60 y.o. male  Right hand knee and foot pain. Started approximately 3 days ago. Patient states that this is his usual location for his gout attacks. Pain is constant and getting worse. Worse with any kind of touch her ambulation.  Patient also complaining of left knee pain. This overall though is somewhat improved from his initial injury. Patient fell wall doing a stress test on 07/13/2015 which led to a patella fracture. Patient is followed by Dr. August Saucer orthopedics.  Patient was given Percocet after his kneecap fracture which is been using for his gout pain with inadequate improvement.  Hospital Course:   AKI (acute kidney injury) (HCC):  ARB held. Hydrated.  On admission 2.74. At discharge 2.2. Previous creatinines have ranged 1.7-2.3. Recommend holding valsartan and rechecking BMET next week. Patient has a nephrologist at [facility] and would like to follow-up with him.   Gout attack, Polyarticular: right hand,  right knee, right foot. Improved with colchicine and prednisone. Continue Uloric. Doppler of right leg without DVT.     HTN (hypertension): Controlled on current regimen, valsartan held.   Diabetes mellitus with complication (HCC), controlled   Abnormal urine: With proteinuria and small leukocyte esterase, but only 6-30 white cells and negative nitrites. No evidence of UTI clinically.   left patellar fracture prior to admission: Appreciate orthopedics follow-up. Dr. August Saucer recommends Ace wrap.    Procedures:  none  Consultations:  orthopedics  Discharge Exam: Filed Vitals:   07/19/15 1017 07/19/15 1203  BP: 154/77 147/75  Pulse: 81 77  Temp:  99 F (37.2 C)  Resp:  20    General: comfortable Cardiovascular: RRR Respiratory: CTA Ext: right hand, knee and foot less swollen, red and tender, with better ROM  Discharge Instructions   Discharge Instructions    Diet - low sodium heart healthy    Complete by:  As directed      Discharge instructions    Complete by:  As directed   HOLD VALSARTAN UNTIL YOUR DOCTOR FOLLOWS UP YOUR LABS     Increase activity slowly    Complete by:  As directed           Current Discharge Medication List    START taking these medications   Details  predniSONE (DELTASONE) 20 MG tablet Take 3 tablets (60 mg total) by mouth daily with breakfast. UNTIL GONE Qty: 12 tablet, Refills: 0      CONTINUE these medications which have CHANGED   Details  oxyCODONE (OXY IR/ROXICODONE) 5 MG immediate release tablet Take 1-2 tablets (5-10 mg total) by mouth every 6 (six) hours as needed for severe pain. Qty: 30 tablet, Refills:  0      CONTINUE these medications which have NOT CHANGED   Details  amLODipine (NORVASC) 5 MG tablet Take 1 tablet (5 mg total) by mouth daily. Qty: 30 tablet, Refills: 3    aspirin 81 MG tablet Take 81 mg by mouth daily.    brinzolamide (AZOPT) 1 % ophthalmic suspension Place 1 drop into the left eye 2 (two) times daily.     carvedilol (COREG) 3.125 MG tablet Take 3.125 mg by mouth daily.     ferrous sulfate 325 (65 FE) MG  tablet Take 325 mg by mouth daily with breakfast.    gabapentin (NEURONTIN) 300 MG capsule TAKE 2 CAPSULES BY MOUTH 3 TIMES A DAY Qty: 180 capsule, Refills: 3    latanoprost (XALATAN) 0.005 % ophthalmic solution Place 1 drop into the left eye at bedtime.     linagliptin (TRADJENTA) 5 MG TABS tablet Take 5 mg by mouth daily.    pantoprazole (PROTONIX) 40 MG tablet TAKE 1 TABLET BY MOUTH TWICE A DAY Qty: 60 tablet, Refills: 8    pentoxifylline (TRENTAL) 400 MG CR tablet Take 400 mg by mouth 2 (two) times daily.    prednisoLONE acetate (PRED FORTE) 1 % ophthalmic suspension Place 1 drop into the right eye at bedtime.     rosuvastatin (CRESTOR) 20 MG tablet TAKE 1 TABLET BY MOUTH DAILY Qty: 30 tablet, Refills: 3    timolol (TIMOPTIC) 0.5 % ophthalmic solution Place 1 drop into both eyes 2 (two) times daily.    ULORIC 40 MG tablet Take 40 mg by mouth daily. Refills: 5    vitamin B-12 (CYANOCOBALAMIN) 1000 MCG tablet Take 1,000 mcg by mouth. (Chewable tablet)    ammonium lactate (LAC-HYDRIN) 12 % lotion Apply 1 application topically 2 (two) times daily as needed (legs). Qty: 400 g, Refills: 2    glucose blood (ACCU-CHEK SMARTVIEW) test strip 1 each by Other route See admin instructions. Check blood sugar 3 times daily.    UNABLE TO FIND Dispense one pair of Diabetic Shoes. ICD E11.49, G62.9, B35.1, R60.9 Qty: 1 each, Refills: 0      STOP taking these medications     valsartan (DIOVAN) 40 MG tablet      diazepam (VALIUM) 2 MG tablet      LINZESS 145 MCG CAPS capsule      oxyCODONE-acetaminophen (PERCOCET/ROXICET) 5-325 MG tablet      traMADol (ULTRAM) 50 MG tablet        Allergies  Allergen Reactions  . Penicillins Swelling and Rash  . Amoxicillin Swelling and Rash  . Niacin Itching and Other (See Comments)    Other reaction(s): Flushing (ALLERGY/intolerance)  . Penicillin G Rash   Follow-up Information    Follow up with PIRKLE, Isidore Moos, MD In 1 week.    Specialty:  Nephrology   Why:  TO CHECK KIDNEY FUNCTION   Contact information:   [facility] [place] [address] [phone]        The results of significant diagnostics from this hospitalization (including imaging, microbiology, ancillary and laboratory) are listed below for reference.    Significant Diagnostic Studies: Dg Ankle Complete Left  20-Jul-2015  CLINICAL DATA:  Fall during treadmill stress status EXAM: LEFT ANKLE COMPLETE - 3+ VIEW COMPARISON:  None. FINDINGS: No fracture or dislocation is seen. The ankle mortise is intact. Moderate degenerative changes of the dorsal midfoot. Vascular calcifications. Associated soft tissue calcifications along the distal lower extremity. IMPRESSION: No fracture or dislocation is seen. Electronically  Signed   By: Charline Bills M.D.   On: 07/06/2015 16:42   Dg Ankle Complete Right  07/17/2015  CLINICAL DATA:  Right anterior heel pain. EXAM: RIGHT ANKLE - COMPLETE 3+ VIEW COMPARISON:  None. FINDINGS: There is no evidence of fracture, dislocation, or joint effusion. There is a small plantar calcaneal spur. Degenerative joint changes of the midfoot are noted. Soft tissues are unremarkable. IMPRESSION: Small plantar calcaneal spur. Degenerative joint changes of midfoot are noted. Electronically Signed   By: Sherian Rein M.D.   On: 07/17/2015 13:40   Ct Knee Left Wo Contrast  07/06/2015  CLINICAL DATA:  Acute left knee pain after fall on treadmill today. EXAM: CT OF THE left KNEE WITHOUT CONTRAST TECHNIQUE: Multidetector CT imaging of the left knee was performed according to the standard protocol. Multiplanar CT image reconstructions were also generated. COMPARISON:  Radiograph of same day. FINDINGS: Minimally displaced longitudinal fracture is seen involving the lateral portion of the patella. Chondrocalcinosis is noted in the medial lateral joint spaces. Minimal osteophyte formation is also noted medially and laterally. No fracture is seen  involving the visualized portion of the distal left femur or proximal tibia or fibula. Vascular calcifications are noted. Moderate joint effusion is noted. IMPRESSION: Minimally displaced longitudinal fracture of the left patella is noted. Moderate joint effusion is noted as well. Degenerative changes are also noted. Electronically Signed   By: Lupita Raider, M.D.   On: 07/06/2015 17:43   Dg Knee Complete 4 Views Left  07/17/2015  CLINICAL DATA:  Patella fracture, subsequent encounter. Evaluate for healing. Diffuse knee pain. EXAM: LEFT KNEE - COMPLETE 4+ VIEW COMPARISON:  03/04/2016 FINDINGS: longitudinal fracture again noted through the patella, best seen on the AP and oblique views, not significantly changed. No visible evidence for healing currently. Minimal displacement. Small joint effusion. No additional acute bony abnormality. Vascular calcifications noted. There is chondrocalcinosis with mild degenerative changes, most pronounced in the patellofemoral compartment. IMPRESSION: Stable appearance of the minimally displaced longitudinal left patellar fracture without visible evidence of healing. Electronically Signed   By: Charlett Nose M.D.   On: 07/17/2015 13:34   Dg Knee Complete 4 Views Left  07/06/2015  CLINICAL DATA:  Status post fall during a cardiac stress test today with a blow to the left knee. Pain. Initial encounter. EXAM: LEFT KNEE - COMPLETE 4+ VIEW COMPARISON:  Plain films left knee 11/13/2014. FINDINGS: There is a linear lucency through the patella seen on the AP and oblique views worrisome for fracture. Moderate joint effusion is identified. Joint spaces are preserved. Chondrocalcinosis is noted. IMPRESSION: Likely longitudinal fracture through the lateral patella. This could be confirmed with CT scan. Electronically Signed   By: Drusilla Kanner M.D.   On: 07/06/2015 16:43   Dg Knee Complete 4 Views Right  07/17/2015  CLINICAL DATA:  Left knee pain after fall last week. EXAM: RIGHT  KNEE - COMPLETE 4+ VIEW COMPARISON:  July 06, 2015. FINDINGS: There is no evidence of fracture, dislocation, or joint effusion. Severe narrowing of patellofemoral space is noted. Spurring of superior aspect of patella is noted. Vascular calcifications are noted. Loose body is noted posteriorly in the knee joint. Chondrocalcinosis is noted medially and laterally. Osteophyte formation is seen involving the lateral joint space. 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 Mild degenerative changes are noted elsewhere. No acute abnormality seen in the right knee. Electronically Signed   By: Lupita Raider, M.D.   On: 07/17/2015  13:38   Dg Knee Complete 4 Views Right  07/06/2015  CLINICAL DATA:  Fall. EXAM: RIGHT KNEE - COMPLETE 4+ VIEW COMPARISON:  11/13/2014 FINDINGS: No joint effusion. Chondrocalcinosis noted. Vascular calcifications are present. Moderate tricompartment osteoarthritis. No fracture or dislocation. IMPRESSION: 1. No acute findings. 2. Osteoarthritis and chondrocalcinosis. Electronically Signed   By: Signa Kell M.D.   On: 07/06/2015 16:43   Dg Hand Complete Right  07/17/2015  CLINICAL DATA:  Swelling, pain, and redness in the right hand for 2 days. No injury. EXAM: RIGHT HAND - COMPLETE 3+ VIEW COMPARISON:  None. FINDINGS: Degenerative changes in the interphalangeal joints of the right hand. Dorsal soft tissue swelling. No evidence of acute fracture or dislocation. No focal bone lesion or bone destruction. Vascular calcifications. Calcification in the triangular fibrocartilage. No radiopaque soft tissue foreign bodies or soft tissue gas collections. IMPRESSION: Degenerative changes in the right hand. Soft tissue swelling. No acute bony abnormalities. Electronically Signed   By: Burman Nieves M.D.   On: 07/17/2015 00:57   Dg Foot Complete Left  07/06/2015  CLINICAL DATA:  Larey Seat during the last minute of a stress test, treadmill was going fast  and his feet gave way, fell onto both knees, pain across 3rd to 5th metatarsals and when pressure is applied to the dorsum of the foot, initial encounter EXAM: LEFT FOOT - COMPLETE 3+ VIEW COMPARISON:  None FINDINGS: Osseous demineralization. Joint spaces preserved. No definite acute fracture, dislocation or bone destruction. Significant spur formation at the talonavicular joint with talar beaking. Naviculocuneiform degenerative changes are also present. Extensive small vessel vascular calcification within the foot. Nonspecific subcutaneous calcifications at the LEFT lower leg greatest medially. IMPRESSION: No acute osseous abnormalities. Significant talonavicular degenerative changes with talar beaking. Electronically Signed   By: Ulyses Southward M.D.   On: 07/06/2015 16:44   Dg Foot Complete Right  07/17/2015  CLINICAL DATA:  Right anterior heel pain.  Tenderness all over foot. EXAM: RIGHT FOOT COMPLETE - 3+ VIEW COMPARISON:  11/03/2011 FINDINGS: Extensive soft tissue calcifications in the lower calf and around the ankle. Vascular calcifications noted in the foot. Degenerative changes at the first MTP joint and in the hindfoot. Plantar and posterior calcaneal spurs. Mild pes planus. No acute bony abnormality. Specifically, no fracture, subluxation, or dislocation. Soft tissues are intact. IMPRESSION: No acute bony abnormality. Electronically Signed   By: Charlett Nose M.D.   On: 07/17/2015 13:37    Microbiology: Recent Results (from the past 240 hour(s))  Urine culture     Status: None   Collection Time: 07/17/15  4:36 AM  Result Value Ref Range Status   Specimen Description URINE, RANDOM  Final   Special Requests none  Final   Culture >=100,000 COLONIES/mL VIRIDANS STREPTOCOCCUS  Final   Report Status 07/18/2015 FINAL  Final     Labs: Basic Metabolic Panel:  Recent Labs Lab 07/17/15 0409 07/18/15 0829 07/19/15 0810  NA 140 138 139  K 4.4 4.1 4.0  CL 102 105 104  CO2 22 25 18*  GLUCOSE 193*  151* 249*  BUN 79* 71* 69*  CREATININE 2.74* 2.46* 2.23*  CALCIUM 9.8 8.5* 8.6*   Liver Function Tests:  Recent Labs Lab 07/17/15 0409  AST 65*  ALT 76*  ALKPHOS 63  BILITOT 0.4  PROT 6.9  ALBUMIN 2.7*   No results for input(s): LIPASE, AMYLASE in the last 168 hours. No results for input(s): AMMONIA in the last 168 hours. CBC:  Recent Labs Lab 07/17/15 0409  07/18/15 0829  WBC 8.3 7.3  NEUTROABS 5.4  --   HGB 9.9* 9.1*  HCT 31.1* 27.4*  MCV 82.3 82.5  PLT 270 236   Cardiac Enzymes: No results for input(s): CKTOTAL, CKMB, CKMBINDEX, TROPONINI in the last 168 hours. BNP: BNP (last 3 results)  Recent Labs  07/17/15 0409  BNP 78.1    ProBNP (last 3 results) No results for input(s): PROBNP in the last 8760 hours.  CBG:  Recent Labs Lab 07/18/15 1250 07/18/15 1639 07/18/15 2116 07/19/15 0759 07/19/15 1203  GLUCAP 125* 262* 272* 226* 218*       Signed:  Christiane Ha MD [facility] 07/19/2015, 2:51 PM

## 2015-07-19 NOTE — Telephone Encounter (Signed)
Received request from pharmacy for PA on Crestor.   PA submitted.   Dx: HLD.   Approved x1 year.   Approval # [id number].

## 2015-07-19 NOTE — Progress Notes (Signed)
Inpatient Diabetes Program Recommendations  AACE/ADA: New Consensus Statement on Inpatient Glycemic Control (2015)  Target Ranges:  Prepandial:   less than 140 mg/dL      Peak postprandial:   less than 180 mg/dL (1-2 hours)      Critically ill patients:  140 - 180 mg/dL   Results for NAI, DASCH (MRN [id number]) as of 07/19/2015 09:20  Ref. Range 07/18/2015 12:50 07/18/2015 16:39 07/18/2015 21:16 07/19/2015 07:59  Glucose-Capillary Latest Ref Range: 65-99 mg/dL 956 (H) 213 (H) 086 (H) 226 (H)   Review of Glycemic Control  Diabetes history: DM 2 Outpatient Diabetes medications: Tradjenta 5 mg Daily Current orders for Inpatient glycemic control: Novolog Resistant + HS  Inpatient Diabetes Program Recommendations: Insulin - Meal Coverage: While on steroids, please consider Novolog 3 units TID meal coverage if patient consumes at least 50% of meals. Glucose consistently in the 200's after PO prednisone 60 mg.  Thanks,  Christena Deem RN, MSN, [facility] Inpatient Diabetes Coordinator Team Pager [phone] (8a-5p)

## 2015-07-19 NOTE — Telephone Encounter (Signed)
Received request from pharmacy for PA on Uloric.  PA submitted.   Dx: Gout  PA Approved x1 year.   Approval # [id number].

## 2015-07-23 ENCOUNTER — Ambulatory Visit: Payer: Medicaid Other | Admitting: Podiatry

## 2015-07-24 ENCOUNTER — Ambulatory Visit: Payer: Self-pay | Admitting: Cardiology

## 2015-08-02 ENCOUNTER — Other Ambulatory Visit: Payer: Self-pay | Admitting: Podiatrist

## 2015-08-02 IMAGING — CR DG KNEE COMPLETE 4+V*R*
4 series · 4 of 4 positions shown · non-contrast
Comparison: 08/04/2013

CLINICAL DATA: Fall on 11/10/2014.  Bilateral knee pain.

EXAM:
RIGHT KNEE - COMPLETE 4+ VIEW

[t knee ap right]
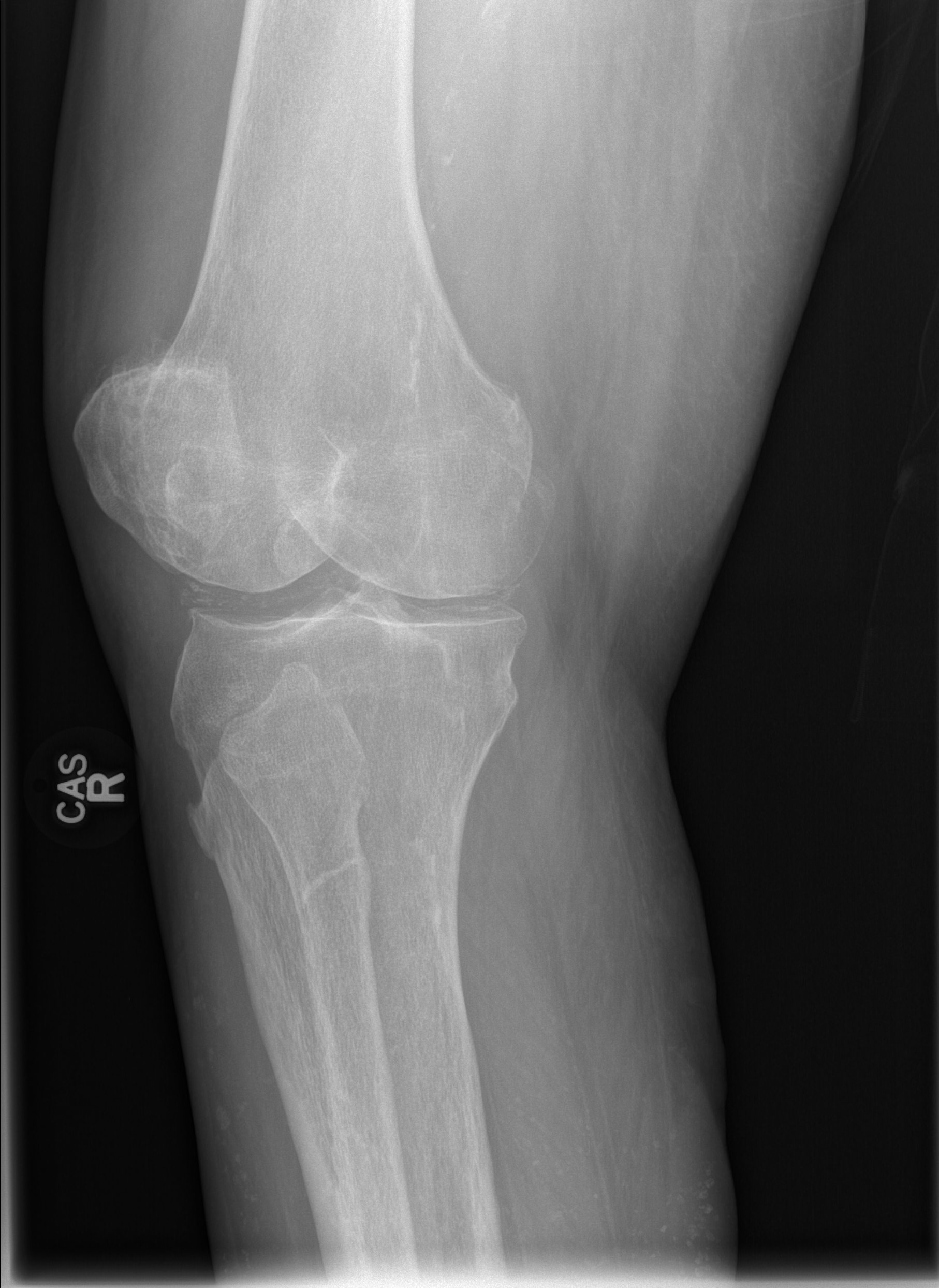

[t knee obl right]
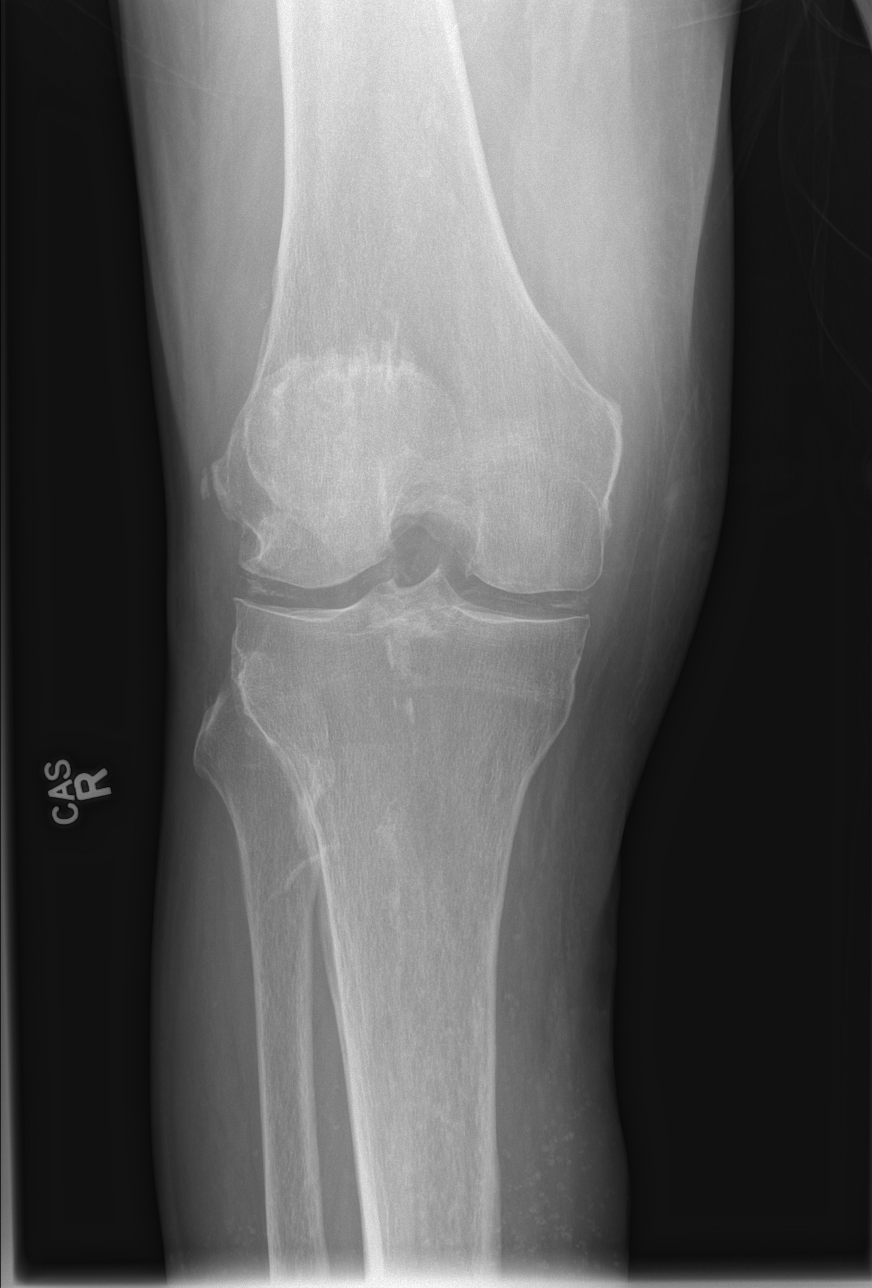

[x knee obl right]
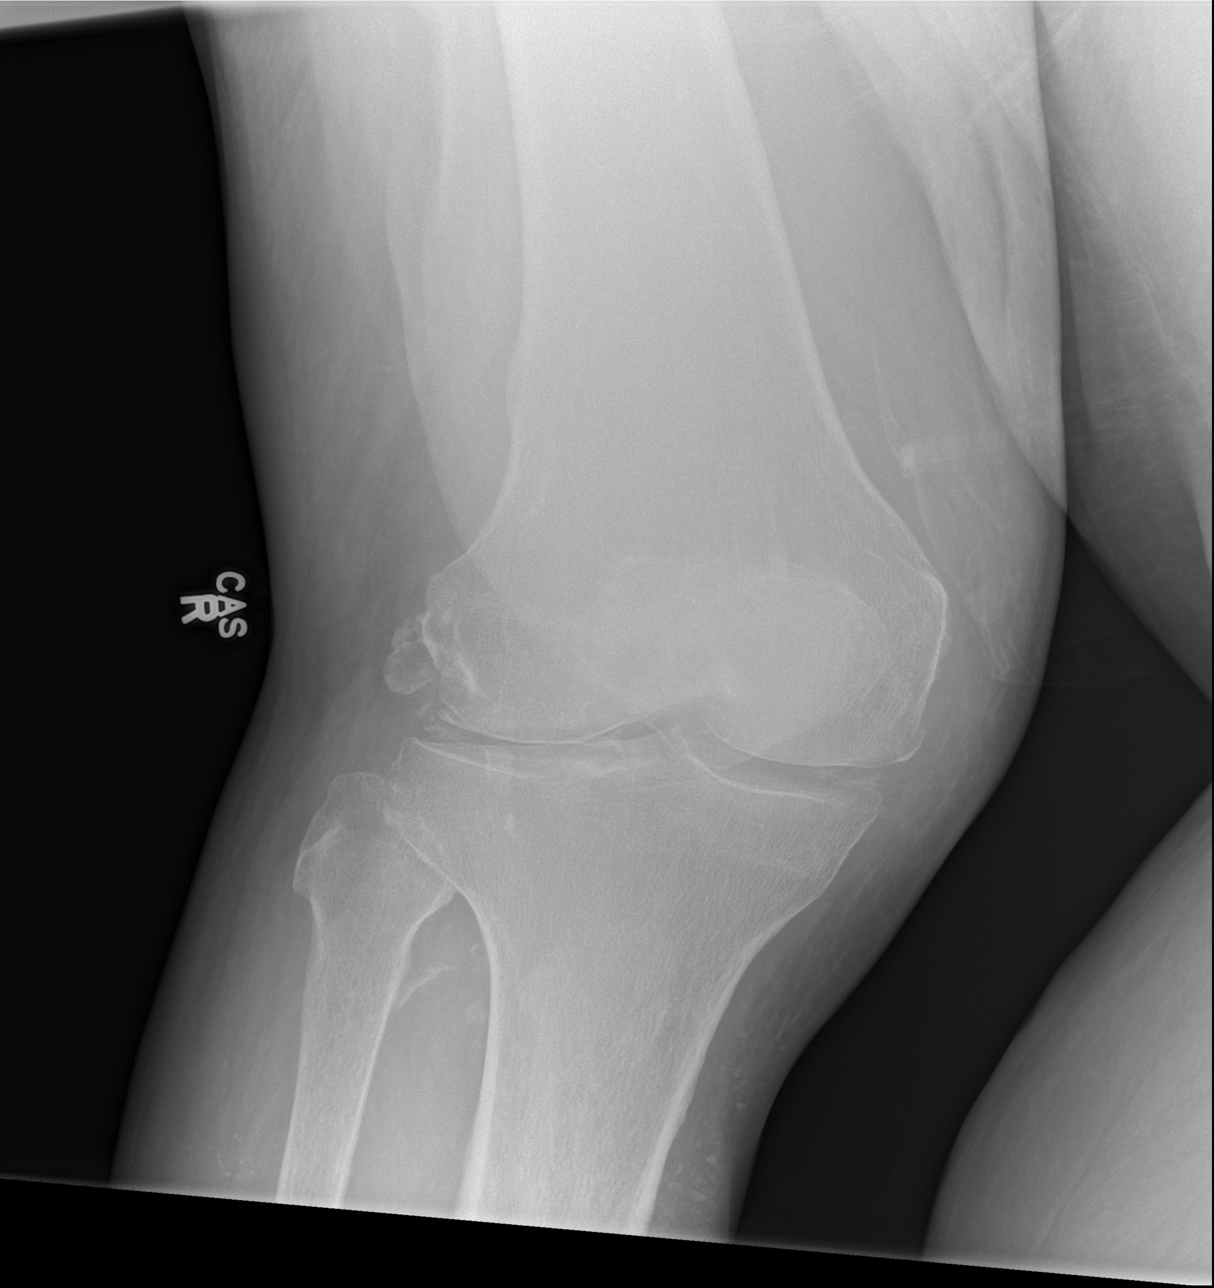

[x knee lat right]
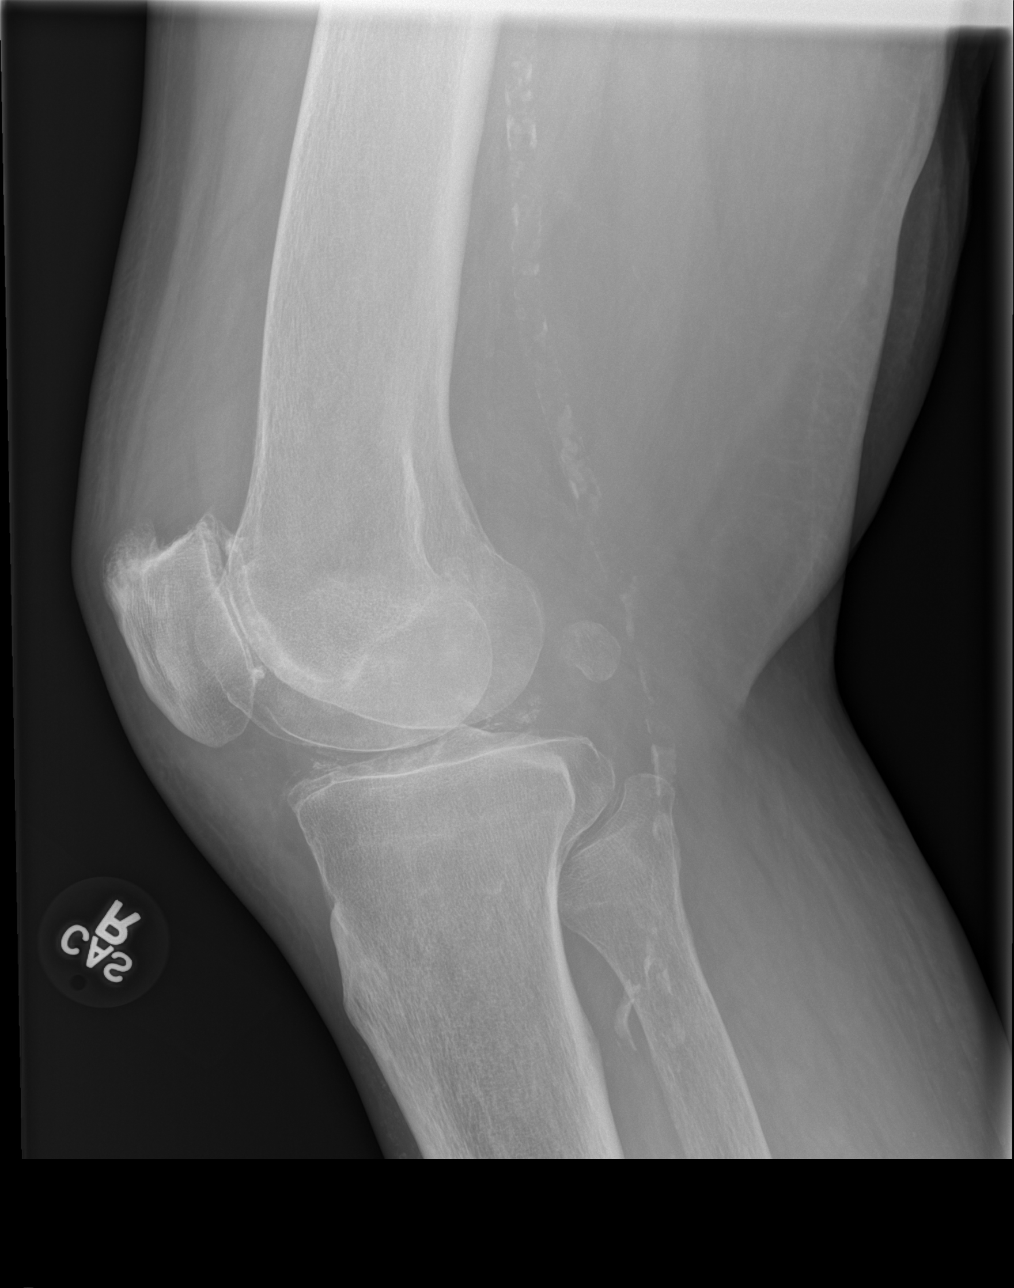

[4 of 4 positions shown; findings below may reference images not displayed]

FINDINGS: Meniscal chondrocalcinosis. Tricompartmental spurring.
Atherosclerotic vascular calcifications. Chronic linear
calcification adjacent to the lateral femoral epicondyle.

Suspected moderate knee effusion on the lateral projection.
Prepatellar subcutaneous edema. Cutaneous calcifications anterior to
the tibia.
IMPRESSION: 1. CPPD arthropathy.
2. Atherosclerosis.
3. Moderate knee effusion with prepatellar subcutaneous edema.

## 2015-08-16 ENCOUNTER — Ambulatory Visit (INDEPENDENT_AMBULATORY_CARE_PROVIDER_SITE_OTHER): Payer: Medicaid Other | Admitting: Family Medicine

## 2015-08-16 ENCOUNTER — Encounter: Payer: Self-pay | Admitting: Family Medicine

## 2015-08-16 VITALS — BP 110/60 | HR 64 | Temp 97.9°F | Resp 18 | Ht 68.0 in | Wt 266.0 lb

## 2015-08-16 DIAGNOSIS — Z09 Encounter for follow-up examination after completed treatment for conditions other than malignant neoplasm: Secondary | ICD-10-CM | POA: Diagnosis not present

## 2015-08-16 DIAGNOSIS — M1000 Idiopathic gout, unspecified site: Secondary | ICD-10-CM

## 2015-08-16 MED ORDER — ULORIC 40 MG PO TABS: 40.0000 mg | ORAL_TABLET | Freq: Every day | ORAL | Status: AC

## 2015-08-16 MED ORDER — PREDNISONE 20 MG PO TABS: ORAL_TABLET | ORAL | Status: DC

## 2015-08-16 NOTE — Progress Notes (Signed)
Subjective:    Patient ID: Alexander Hubbard, male    DOB: May 07, 1956, 60 y.o.   MRN: [id number]  HPI Patient recently suffered a vertical fracture in his left patella after he fell while walking on a treadmill. This is being followed by orthopedics. He was admitted to the hospital with acute kidney injury secondary to dehydration. His creatinine peaked at 2.74. He also developed polyarthralgia secondary to gout. He was treated with 5 days of prednisone 60 mg a day. The pains in his wrist and knees have left him. He continues to have pain in the left knee From the fracture but otherwise is doing well. He is seeing his nephrologist who is monitoring his creatinine. Last week and it fallen to 2.0. He just had blood work drawn this morning. Past Medical History  Diagnosis Date  . Diabetes mellitus   . Hypertension   . Obesity   . Sleep apnea   . Gouty arthritis   . Varicose veins   . Gout attack 07/2015    RIGHT HAND LEFT SHOULDER RIGHT KNEE DOWN   . AKI (acute kidney injury) (HCC) 07/2015  . UTI (urinary tract infection) 07/2015  . CHF (congestive heart failure) (HCC)   . Glaucoma (increased eye pressure)   . Neuropathy of both feet [facility](HCC)    Past Surgical History  Procedure Laterality Date  . Eye surgery    . Knee surgery    . Gastric bypass     Current Outpatient Prescriptions on File Prior to Visit  Medication Sig Dispense Refill  . amLODipine (NORVASC) 5 MG tablet Take 1 tablet (5 mg total) by mouth daily. 30 tablet 3  . ammonium lactate (LAC-HYDRIN) 12 % lotion Apply 1 application topically 2 (two) times daily as needed (legs). 400 g 2  . ammonium lactate (LAC-HYDRIN) 12 % lotion APPLY TO LEGS 2 TIMES DAILY 400 g 11  . aspirin 81 MG tablet Take 81 mg by mouth daily.    . brinzolamide (AZOPT) 1 % ophthalmic suspension Place 1 drop into the left eye 2 (two) times daily.     . carvedilol (COREG) 3.125 MG tablet Take 3.125 mg by mouth daily.     . ferrous sulfate 325 (65 FE) MG tablet  Take 325 mg by mouth daily with breakfast.    . gabapentin (NEURONTIN) 300 MG capsule TAKE 2 CAPSULES BY MOUTH 3 TIMES A DAY 180 capsule 3  . glucose blood (ACCU-CHEK SMARTVIEW) test strip 1 each by Other route See admin instructions. Check blood sugar 3 times daily.    Marland Kitchen. latanoprost (XALATAN) 0.005 % ophthalmic solution Place 1 drop into the left eye at bedtime.     Marland Kitchen. linagliptin (TRADJENTA) 5 MG TABS tablet Take 5 mg by mouth daily.    Marland Kitchen. oxyCODONE (OXY IR/ROXICODONE) 5 MG immediate release tablet Take 1-2 tablets (5-10 mg total) by mouth every 6 (six) hours as needed for severe pain. 30 tablet 0  . pantoprazole (PROTONIX) 40 MG tablet TAKE 1 TABLET BY MOUTH TWICE A DAY 60 tablet 8  . pentoxifylline (TRENTAL) 400 MG CR tablet Take 400 mg by mouth 2 (two) times daily.    . prednisoLONE acetate (PRED FORTE) 1 % ophthalmic suspension Place 1 drop into the right eye at bedtime.     . predniSONE (DELTASONE) 20 MG tablet Take 3 tablets (60 mg total) by mouth daily with breakfast. UNTIL GONE 12 tablet 0  . rosuvastatin (CRESTOR) 20 MG tablet TAKE 1 TABLET BY MOUTH DAILY  30 tablet 3  . timolol (TIMOPTIC) 0.5 % ophthalmic solution Place 1 drop into both eyes 2 (two) times daily.    Marland Kitchen UNABLE TO FIND Dispense one pair of Diabetic Shoes. ICD E11.49, G62.9, B35.1, R60.9 1 each 0  . vitamin B-12 (CYANOCOBALAMIN) 1000 MCG tablet Take 1,000 mcg by mouth. (Chewable tablet)     No current facility-administered medications on file prior to visit.   Allergies  Allergen Reactions  . Penicillins Swelling and Rash  . Amoxicillin Swelling and Rash  . Niacin Itching and Other (See Comments)    Other reaction(s): Flushing (ALLERGY/intolerance)  . Penicillin G Rash   Social History   Social History  . Marital Status: Married    Spouse Name: N/A  . Number of Children: N/A  . Years of Education: N/A   Occupational History  . Not on file.   Social History Main Topics  . Smoking status: Never Smoker   .  Smokeless tobacco: Never Used  . Alcohol Use: No  . Drug Use: No  . Sexual Activity: Not on file   Other Topics Concern  . Not on file   Social History Narrative      Review of Systems  All other systems reviewed and are negative.      Objective:   Physical Exam  Constitutional: He appears well-developed and well-nourished.  Cardiovascular: Normal rate, regular rhythm and normal heart sounds.   No murmur heard. Pulmonary/Chest: Effort normal and breath sounds normal. No respiratory distress. He has no wheezes. He has no rales.  Abdominal: Soft. Bowel sounds are normal.  Musculoskeletal: He exhibits no tenderness.  Vitals reviewed.         Assessment & Plan:  Hospital discharge follow-up  Acute idiopathic gout, unspecified site  Gout flare has subsided. The patient is asking for refill on prednisone as [place] be [place] the [place] and he is afraid should he develop another gout flare he will add nothing to treat it with. In the past called Alexander Hubbard has not helped this patient. I explained to the patient that he should only use prednisone if absolutely necessary. Alexander Hubbard at 40 mg once a day is for prevention of gout attacks. Taking it twice a day will not help the pain when it occurs. Therefore the patient will decrease his usual lower back to 40 mg once a day. His creatinine is being followed by his nephrologist and was just checked this morning. Therefore I will not check it today.

## 2015-12-13 ENCOUNTER — Encounter (HOSPITAL_BASED_OUTPATIENT_CLINIC_OR_DEPARTMENT_OTHER): Payer: Medicaid Other

## 2015-12-17 ENCOUNTER — Ambulatory Visit: Payer: Medicaid Other | Admitting: Family Medicine

## 2015-12-18 ENCOUNTER — Encounter: Payer: Self-pay | Admitting: Family Medicine

## 2015-12-18 ENCOUNTER — Ambulatory Visit (INDEPENDENT_AMBULATORY_CARE_PROVIDER_SITE_OTHER): Payer: Medicaid Other | Admitting: Family Medicine

## 2015-12-18 VITALS — BP 112/78 | Temp 98.4°F | Wt 259.0 lb

## 2015-12-18 DIAGNOSIS — Z794 Long term (current) use of insulin: Secondary | ICD-10-CM

## 2015-12-18 DIAGNOSIS — L84 Corns and callosities: Secondary | ICD-10-CM | POA: Diagnosis not present

## 2015-12-18 DIAGNOSIS — E119 Type 2 diabetes mellitus without complications: Secondary | ICD-10-CM

## 2015-12-18 LAB — CBC WITH DIFFERENTIAL/PLATELET
BASOS ABS: 0 {cells}/uL (ref 0–200)
BASOS PCT: 0 %
EOS ABS: 198 {cells}/uL (ref 15–500)
Eosinophils Relative: 2 %
HEMATOCRIT: 31.4 % — AB (ref 38.5–50.0)
Hemoglobin: 9.9 g/dL — ABNORMAL LOW (ref 13.0–17.0)
Lymphocytes Relative: 18 %
Lymphs Abs: 1782 cells/uL (ref 850–3900)
MCH: 25.4 pg — AB (ref 27.0–33.0)
MCHC: 31.5 g/dL — ABNORMAL LOW (ref 32.0–36.0)
MCV: 80.5 fL (ref 80.0–100.0)
MONO ABS: 495 {cells}/uL (ref 200–950)
MONOS PCT: 5 %
MPV: 10.4 fL (ref 7.5–12.5)
NEUTROS ABS: 7425 {cells}/uL (ref 1500–7800)
Neutrophils Relative %: 75 %
PLATELETS: 186 10*3/uL (ref 140–400)
RBC: 3.9 MIL/uL — ABNORMAL LOW (ref 4.20–5.80)
RDW: 17.1 % — ABNORMAL HIGH (ref 11.0–15.0)
WBC: 9.9 10*3/uL (ref 3.8–10.8)

## 2015-12-18 MED ORDER — MUPIROCIN 2 % EX OINT: 1.0000 "application " | TOPICAL_OINTMENT | Freq: Two times a day (BID) | CUTANEOUS | Status: DC

## 2015-12-18 NOTE — Progress Notes (Signed)
Subjective:    Patient ID: Alexander BouquetRiyad Hubbard, male    DOB: 1955/06/07, 60 y.o.   MRN: [id number]  HPI  4 weeks ago, the patient stepped on a sharp object and suffered a puncture wound to the plantar aspect of his left foot. On examination today there is a quarter sized thick callus with capillary hemorrhages and a central small 4 mm ulcer is very shallow. This area is nonhealing and complicated by the fact the patient has neuropathy in his feet and cannot feel any pain. I spent approximately 10 minutes a day debriding the thick hyperkeratotic callus tissue from around the wound and smoothing out the skin to the viable underlying tissue. In the center there is a 4 mm very shallow ulcer underneath the thick callus. I can easily see the bottom and it does not extend into the subcutaneous tissue. Past Medical History  Diagnosis Date  . Diabetes mellitus   . Hypertension   . Obesity   . Sleep apnea   . Gouty arthritis   . Varicose veins   . Gout attack 07/2015    RIGHT HAND LEFT SHOULDER RIGHT KNEE DOWN   . AKI (acute kidney injury) (HCC) 07/2015  . UTI (urinary tract infection) 07/2015  . CHF (congestive heart failure) (HCC)   . Glaucoma (increased eye pressure)   . Neuropathy of both feet [facility](HCC)    Past Surgical History  Procedure Laterality Date  . Eye surgery    . Knee surgery    . Gastric bypass     Current Outpatient Prescriptions on File Prior to Visit  Medication Sig Dispense Refill  . amLODipine (NORVASC) 5 MG tablet Take 1 tablet (5 mg total) by mouth daily. 30 tablet 3  . ammonium lactate (LAC-HYDRIN) 12 % lotion Apply 1 application topically 2 (two) times daily as needed (legs). 400 g 2  . aspirin 81 MG tablet Take 81 mg by mouth daily.    . brinzolamide (AZOPT) 1 % ophthalmic suspension Place 1 drop into the left eye 2 (two) times daily.     . carvedilol (COREG) 3.125 MG tablet Take 3.125 mg by mouth daily.     . ferrous sulfate 325 (65 FE) MG tablet Take 325 mg by mouth daily  with breakfast.    . gabapentin (NEURONTIN) 300 MG capsule TAKE 2 CAPSULES BY MOUTH 3 TIMES A DAY 180 capsule 3  . glucose blood (ACCU-CHEK SMARTVIEW) test strip 1 each by Other route See admin instructions. Check blood sugar 3 times daily.    Marland Kitchen. latanoprost (XALATAN) 0.005 % ophthalmic solution Place 1 drop into the left eye at bedtime.     Marland Kitchen. linagliptin (TRADJENTA) 5 MG TABS tablet Take 5 mg by mouth daily.    . pantoprazole (PROTONIX) 40 MG tablet TAKE 1 TABLET BY MOUTH TWICE A DAY 60 tablet 8  . pentoxifylline (TRENTAL) 400 MG CR tablet Take 400 mg by mouth 2 (two) times daily.    . prednisoLONE acetate (PRED FORTE) 1 % ophthalmic suspension Place 1 drop into the right eye at bedtime.     . rosuvastatin (CRESTOR) 20 MG tablet TAKE 1 TABLET BY MOUTH DAILY 30 tablet 3  . timolol (TIMOPTIC) 0.5 % ophthalmic solution Place 1 drop into both eyes 2 (two) times daily.    Marland Kitchen. ULORIC 40 MG tablet Take 1 tablet (40 mg total) by mouth daily. 30 tablet 5  . UNABLE TO FIND Dispense one pair of Diabetic Shoes. ICD E11.49, G62.9, B35.1, R60.9 1  each 0  . vitamin B-12 (CYANOCOBALAMIN) 1000 MCG tablet Take 1,000 mcg by mouth. (Chewable tablet)     No current facility-administered medications on file prior to visit.   Allergies  Allergen Reactions  . Penicillins Swelling and Rash  . Amoxicillin Swelling and Rash  . Niacin Itching and Other (See Comments)    Other reaction(s): Flushing (ALLERGY/intolerance)  . Penicillin G Rash   Social History   Social History  . Marital Status: Married    Spouse Name: N/A  . Number of Children: N/A  . Years of Education: N/A   Occupational History  . Not on file.   Social History Main Topics  . Smoking status: Never Smoker   . Smokeless tobacco: Never Used  . Alcohol Use: No  . Drug Use: No  . Sexual Activity: Not on file   Other Topics Concern  . Not on file   Social History Narrative     Review of Systems  All other systems reviewed and are  negative.      Objective:   Physical Exam  Cardiovascular: Normal rate, regular rhythm and normal heart sounds.   Pulmonary/Chest: Effort normal and breath sounds normal. No respiratory distress. He has no wheezes. He has no rales.  Skin: No rash noted. No erythema.  Vitals reviewed.         Assessment & Plan:  Pre-ulcerative calluses - Plan: mupirocin ointment (BACTROBAN) 2 %  Controlled type 2 diabetes mellitus without complication, with long-term current use of insulin (HCC) - Plan: CBC with Differential/Platelet, COMPLETE METABOLIC PANEL WITH GFR, Lipid panel, Hemoglobin A1c  Pre-ulcerative callus was debrided using a razor blade down to the underlying viable tissue. I recommended elevating his legs and taking the pressure off the wound is much as possible. I recommended applying Bactroban twice daily to prevent a skin infection. Recheck in one week. I will check a CBC, CMP, and a hemoglobin A1c

## 2015-12-19 LAB — COMPLETE METABOLIC PANEL WITH GFR
ALT: 12 U/L (ref 9–46)
AST: 13 U/L (ref 10–35)
Albumin: 3.3 g/dL — ABNORMAL LOW (ref 3.6–5.1)
Alkaline Phosphatase: 52 U/L (ref 40–115)
BUN: 40 mg/dL — AB (ref 7–25)
CHLORIDE: 111 mmol/L — AB (ref 98–110)
CO2: 20 mmol/L (ref 20–31)
Calcium: 8.5 mg/dL — ABNORMAL LOW (ref 8.6–10.3)
Creat: 1.96 mg/dL — ABNORMAL HIGH (ref 0.70–1.25)
GFR, Est African American: 42 mL/min — ABNORMAL LOW (ref >=60)
GFR, Est Non African American: 36 mL/min — ABNORMAL LOW (ref >=60)
GLUCOSE: 100 mg/dL — AB (ref 70–99)
POTASSIUM: 4.7 mmol/L (ref 3.5–5.3)
SODIUM: 142 mmol/L (ref 135–146)
Total Bilirubin: 0.3 mg/dL (ref 0.2–1.2)
Total Protein: 5.9 g/dL — ABNORMAL LOW (ref 6.1–8.1)

## 2015-12-19 LAB — LIPID PANEL
CHOL/HDL RATIO: 2.3 ratio (ref <=5.0)
Cholesterol: 91 mg/dL — ABNORMAL LOW (ref 125–200)
HDL: 40 mg/dL (ref >=40)
LDL CALC: 40 mg/dL (ref <130)
Triglycerides: 56 mg/dL (ref <150)
VLDL: 11 mg/dL (ref <30)

## 2015-12-19 LAB — HEMOGLOBIN A1C
HEMOGLOBIN A1C: 5.8 % — AB (ref <5.7)
MEAN PLASMA GLUCOSE: 120 mg/dL

## 2015-12-21 ENCOUNTER — Ambulatory Visit (INDEPENDENT_AMBULATORY_CARE_PROVIDER_SITE_OTHER): Payer: Medicaid Other | Admitting: Podiatry

## 2015-12-21 ENCOUNTER — Encounter: Payer: Self-pay | Admitting: Podiatry

## 2015-12-21 ENCOUNTER — Encounter (HOSPITAL_BASED_OUTPATIENT_CLINIC_OR_DEPARTMENT_OTHER): Payer: Medicaid Other | Attending: Internal Medicine

## 2015-12-21 DIAGNOSIS — M79676 Pain in unspecified toe(s): Secondary | ICD-10-CM

## 2015-12-21 DIAGNOSIS — G4730 Sleep apnea, unspecified: Secondary | ICD-10-CM | POA: Insufficient documentation

## 2015-12-21 DIAGNOSIS — L97521 Non-pressure chronic ulcer of other part of left foot limited to breakdown of skin: Secondary | ICD-10-CM | POA: Insufficient documentation

## 2015-12-21 DIAGNOSIS — E1151 Type 2 diabetes mellitus with diabetic peripheral angiopathy without gangrene: Secondary | ICD-10-CM | POA: Insufficient documentation

## 2015-12-21 DIAGNOSIS — I509 Heart failure, unspecified: Secondary | ICD-10-CM | POA: Insufficient documentation

## 2015-12-21 DIAGNOSIS — R51 Headache: Secondary | ICD-10-CM | POA: Insufficient documentation

## 2015-12-21 DIAGNOSIS — B351 Tinea unguium: Secondary | ICD-10-CM | POA: Diagnosis not present

## 2015-12-21 DIAGNOSIS — E11610 Type 2 diabetes mellitus with diabetic neuropathic arthropathy: Secondary | ICD-10-CM | POA: Diagnosis not present

## 2015-12-21 DIAGNOSIS — M109 Gout, unspecified: Secondary | ICD-10-CM | POA: Insufficient documentation

## 2015-12-21 DIAGNOSIS — H409 Unspecified glaucoma: Secondary | ICD-10-CM | POA: Diagnosis not present

## 2015-12-21 DIAGNOSIS — E11621 Type 2 diabetes mellitus with foot ulcer: Secondary | ICD-10-CM | POA: Insufficient documentation

## 2015-12-21 DIAGNOSIS — E1142 Type 2 diabetes mellitus with diabetic polyneuropathy: Secondary | ICD-10-CM | POA: Insufficient documentation

## 2015-12-21 DIAGNOSIS — G6289 Other specified polyneuropathies: Secondary | ICD-10-CM

## 2015-12-21 DIAGNOSIS — I110 Hypertensive heart disease with heart failure: Secondary | ICD-10-CM | POA: Diagnosis not present

## 2015-12-24 ENCOUNTER — Ambulatory Visit: Payer: Medicaid Other | Admitting: Family Medicine

## 2015-12-28 DIAGNOSIS — E11621 Type 2 diabetes mellitus with foot ulcer: Secondary | ICD-10-CM | POA: Diagnosis not present

## 2015-12-30 NOTE — Progress Notes (Signed)
Patient ID: Ahmet Butzer, male   DOB: April 21, 1956, 60 y.o.   MRN: [id number]  Subjective: 60 y.o. returns the office today for painful, elongated, thickened toenails which she is unable to trim herself. Denies any redness or drainage around the nails. Denies any acute changes since last appointment and no new complaints today. Weeks ago she stepped on some screws and she has wounds to her left foot she's been going to the wound care center. She was last seen today. Will defer treatment for this to them as she does not want have the pins change today. Denies any systemic complaints such as fevers, chills, nausea, vomiting.   Objective: AAO 3, NAD DP/PT pulses palpable, CRT less than 3 seconds Protective sensation decreased with Simms Weinstein monofilament Nails hypertrophic, dystrophic, elongated, brittle, discolored 10. There is tenderness overlying the nails 1-5 bilaterally. There is no surrounding erythema or drainage along the nail sites. No open lesions or pre-ulcerative lesions are identified bilaterally.  No other areas of tenderness bilateral lower extremities. No overlying edema, erythema, increased warmth. No pain with calf compression, swelling, warmth, erythema.  Assessment: Patient presents with symptomatic onychomycosis  Plan: -Treatment options including alternatives, risks, complications were discussed -Nails sharply debrided 10 without complication/bleeding. -Bandage intact the left foot. She does not wish of this removed today so just placed today. We'll defer treatment to the wound care center. Discussed with her that if she likes any difficulty when walking more than happy to do so. -Discussed daily foot inspection. If there are any changes, to call the office immediately.  -She is requesting diabetic shoes today. Paperwork completed today for diabetic shoe precertification. -Follow-up in 3 months or sooner if any problems are to arise. In the meantime, encouraged to call  the office with any questions, concerns, changes symptoms.  Ovid Curd, DPM

## 2015-12-31 ENCOUNTER — Telehealth: Payer: Self-pay | Admitting: Family Medicine

## 2015-12-31 DIAGNOSIS — E118 Type 2 diabetes mellitus with unspecified complications: Principal | ICD-10-CM

## 2015-12-31 DIAGNOSIS — E1165 Type 2 diabetes mellitus with hyperglycemia: Secondary | ICD-10-CM

## 2015-12-31 DIAGNOSIS — IMO0002 Reserved for concepts with insufficient information to code with codable children: Secondary | ICD-10-CM

## 2015-12-31 NOTE — Telephone Encounter (Signed)
Patient would like a referral for a new diabetic doctor he is currently seeing a doctor in [place] with [employer] and this is too far. He would like a provider here in [facility]  CB# [phone]

## 2016-01-01 ENCOUNTER — Other Ambulatory Visit: Payer: Self-pay | Admitting: Internal Medicine

## 2016-01-01 ENCOUNTER — Inpatient Hospital Stay (HOSPITAL_COMMUNITY): Admission: RE | Admit: 2016-01-01 | Payer: Self-pay | Source: Ambulatory Visit

## 2016-01-01 DIAGNOSIS — I739 Peripheral vascular disease, unspecified: Secondary | ICD-10-CM

## 2016-01-01 NOTE — Telephone Encounter (Signed)
Happy to place new referral for him to see Endocrinologist within [facility] system and more convenient for him.  Referral initiated

## 2016-01-03 ENCOUNTER — Other Ambulatory Visit: Payer: Self-pay | Admitting: Family Medicine

## 2016-01-03 ENCOUNTER — Encounter: Payer: Self-pay | Admitting: Cardiology

## 2016-01-03 ENCOUNTER — Encounter (INDEPENDENT_AMBULATORY_CARE_PROVIDER_SITE_OTHER): Payer: Self-pay

## 2016-01-03 ENCOUNTER — Ambulatory Visit (INDEPENDENT_AMBULATORY_CARE_PROVIDER_SITE_OTHER): Payer: Medicaid Other | Admitting: Cardiology

## 2016-01-03 VITALS — BP 128/74 | HR 60 | Ht 70.0 in | Wt 259.0 lb

## 2016-01-03 DIAGNOSIS — R079 Chest pain, unspecified: Secondary | ICD-10-CM | POA: Diagnosis not present

## 2016-01-03 DIAGNOSIS — N183 Chronic kidney disease, stage 3 unspecified: Secondary | ICD-10-CM

## 2016-01-03 DIAGNOSIS — E119 Type 2 diabetes mellitus without complications: Secondary | ICD-10-CM | POA: Diagnosis not present

## 2016-01-03 NOTE — Patient Instructions (Signed)
Medication Instructions:  The current medical regimen is effective;  continue present plan and medications.  Follow-Up: Follow up in 6 months with Dr. Anne Fu.  You will receive a letter in the mail 2 months before you are due.  Please call us when you receive this letter to schedule your follow up appointment.  If you need a refill on your cardiac medications before your next appointment, please call your pharmacy.  Thank you for choosing [facility]!!     Dr Debara Pickett

## 2016-01-03 NOTE — Progress Notes (Signed)
[address] [place] [place]  [address] Phone: [phone] Fax:  [phone]  Date:  01/03/2016   ID:  Alexander Hubbard, DOB 08/13/1955, MRN [id number]  PCP:  Leo Grosser, MD   History of Present Illness: Alexander Hubbard is a 60 y.o. male here for follow up post weight reduction surgery. Has lost significant amount of weight. He feels much better. Had prior issues with emesis. Endoscopy performed.  Ulcer has healed.  He has a history of morbid obesity, hypertension, diabetes, chronic kidney disease, chronic diastolic heart failure, noncompliant obstructive sleep apnea CPAP.  EF probably 45%. NUC stress 4/13 low risk (reduced sensitivity due to attenuation artifact).  Had puncture wound to foot. Also prior to this had admit in 07/2015 with mild AKI (2.7 from base 2.0), UTI. Sees nephrologist in [place].  He's been feeling some dizziness when standing up. 6 months ago and blood pressure medication was started by nephrology. He does not know which one that is.   no strokelike symptoms, no syncope   Wt Readings from Last 3 Encounters:  01/03/16 259 lb (117.5 kg)  12/18/15 259 lb (117.5 kg)  08/16/15 266 lb (120.7 kg)     Past Medical History:  Diagnosis Date  . AKI (acute kidney injury) (HCC) 07/2015  . CHF (congestive heart failure) (HCC)   . Diabetes mellitus   . Glaucoma (increased eye pressure)   . Gout attack 07/2015   RIGHT HAND LEFT SHOULDER RIGHT KNEE DOWN   . Gouty arthritis   . Hypertension   . Neuropathy of both feet (HCC)   . Obesity   . Sleep apnea   . UTI (urinary tract infection) 07/2015  . Varicose veins     Past Surgical History:  Procedure Laterality Date  . EYE SURGERY    . GASTRIC BYPASS    . KNEE SURGERY      Current Outpatient Prescriptions  Medication Sig Dispense Refill  . amLODipine (NORVASC) 5 MG tablet Take 1 tablet (5 mg total) by mouth daily. 30 tablet 3  . ammonium lactate (LAC-HYDRIN) 12 % lotion Apply 1  application topically 2 (two) times daily as needed (legs). 400 g 2  . aspirin 81 MG tablet Take 81 mg by mouth daily.    . brinzolamide (AZOPT) 1 % ophthalmic suspension Place 1 drop into the left eye 2 (two) times daily.     . Calcium Citrate-Vitamin D (CALCIUM + D PO) Take by mouth.    . carvedilol (COREG) 3.125 MG tablet Take 3.125 mg by mouth daily.     . ferrous sulfate 325 (65 FE) MG tablet Take 325 mg by mouth daily with breakfast.    . gabapentin (NEURONTIN) 300 MG capsule TAKE 2 CAPSULES BY MOUTH 3 TIMES A DAY 180 capsule 3  . glucose blood (ACCU-CHEK SMARTVIEW) test strip 1 each by Other route See admin instructions. Check blood sugar 3 times daily.    Marland Kitchen latanoprost (XALATAN) 0.005 % ophthalmic solution Place 1 drop into the left eye at bedtime.     Marland Kitchen linagliptin (TRADJENTA) 5 MG TABS tablet Take 5 mg by mouth daily.    . Multiple Vitamins-Minerals (MULTIVITAMIN & MINERAL PO) Take by mouth daily.    . mupirocin ointment (BACTROBAN) 2 % Apply 1 application topically 2 (two) times daily. 22 g 0  . pantoprazole (PROTONIX) 40 MG tablet TAKE 1 TABLET BY MOUTH TWICE A DAY 60 tablet 8  . pentoxifylline (TRENTAL) 400 MG  CR tablet Take 400 mg by mouth 2 (two) times daily.    . rosuvastatin (CRESTOR) 20 MG tablet TAKE 1 TABLET BY MOUTH DAILY 30 tablet 3  . timolol (TIMOPTIC) 0.5 % ophthalmic solution Place 1 drop into both eyes 2 (two) times daily.    Marland Kitchen ULORIC 40 MG tablet Take 1 tablet (40 mg total) by mouth daily. 30 tablet 5  . UNABLE TO FIND Dispense one pair of Diabetic Shoes. ICD E11.49, G62.9, B35.1, R60.9 1 each 0  . vitamin B-12 (CYANOCOBALAMIN) 1000 MCG tablet Take 1,000 mcg by mouth. (Chewable tablet)     No current facility-administered medications for this visit.     Allergies:    Allergies  Allergen Reactions  . Penicillins Swelling and Rash  . Amoxicillin Swelling and Rash  . Niacin Itching and Other (See Comments)    Other reaction(s): Flushing (ALLERGY/intolerance)  .  Penicillin G Rash    Social History:  The patient  reports that he has never smoked. He has never used smokeless tobacco. He reports that he does not drink alcohol or use drugs.   ROS:  Please see the history of present illness.    Denies any chest discomfort, no significant increase in shortness of breath, positive for isolated syncopal episode as described above, no rashes, no fevers, no cough.    PHYSICAL EXAM: VS:  BP 128/74   Pulse 60   Ht 5\' 10"  (1.778 m)   Wt 259 lb (117.5 kg)   BMI 37.16 kg/m  Well nourished, well developed, in no acute distress  HEENT: normal right neck bruit.  Neck: no JVD,   Cardiac:  normal S1, S2; RRR; soft systolic murmur Right upper sternal border, distant heart sounds due to to body habitus Lungs:  clear to auscultation bilaterally, no wheezing, rhonchi or rales  Abd: soft, nontender, no hepatomegaly Morbid obesity improved Ext: no edema Chronic venous stasis changes however edema is much improved Skin: warm and dry  Neuro: no focal abnormalities noted  EKG:     EKG is done today. 07/02/15 shows sinus rhythm, first-degree AV block, PR interval 226 ms, right bundle branch block , no other ST segment changes. No significant change from prior-04/14/14-normal sinus rhythm, 66, right bundle branch block.  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 Carotid bruit-right side-mild carotid plaque bilaterally. No need for follow-up. 2.  Atypical chest pain-stress test on 07/06/15 normal reassuring, unfortunately fell and broke his patella. Healed. 3. Weight loss-significant weight loss post gastric surgery. RBBB no change. 4. Morbid obesity- 268 from 408. Wonderful.  No symptoms. Doing well. Continue to encourage exercise. Decrease aspirin to 81 mg. 5. Hypertension-currently controlled. Watching with kidney disease. Does not take Advil. Only Tylenol. His nephrologist recently started a new medicine 6  months ago. He states that he has been feeling some dizziness. I've asked him to call back the office to tell us what it is. I said we may need to stop his amlodipine and he states that he has not been taking that medication. I'm confused about his medications. Trying to reconcile. 6. Hyperlipidemia- Crestor but he does not know which formulation of statin he is taking. 7. Diabetes-per primary team. No longer on insulin 8. Obstructive sleep apnea-encourage use of CPAP. 9. Chronic kidney disease stage III-he is seeing nephrology. Last creatinine was in the 2.5 range. 10. Venous insufficiency-reviewed Dr. Bosie Helper note ablation of saphenous vein discussed. 11. We will see  back in 6 months.  Signed, Donato Schultz, MD [facility]  01/03/2016 2:05 PM

## 2016-01-07 ENCOUNTER — Other Ambulatory Visit: Payer: Self-pay | Admitting: Internal Medicine

## 2016-01-07 ENCOUNTER — Ambulatory Visit (HOSPITAL_COMMUNITY)
Admission: RE | Admit: 2016-01-07 | Discharge: 2016-01-07 | Disposition: A | Discharge status: Home / Self Care | Payer: Medicaid Other | Source: Ambulatory Visit | Attending: Cardiovascular Disease | Admitting: Cardiovascular Disease

## 2016-01-07 ENCOUNTER — Encounter (HOSPITAL_BASED_OUTPATIENT_CLINIC_OR_DEPARTMENT_OTHER): Payer: Medicaid Other | Attending: Internal Medicine

## 2016-01-07 DIAGNOSIS — I739 Peripheral vascular disease, unspecified: Secondary | ICD-10-CM | POA: Diagnosis not present

## 2016-01-07 DIAGNOSIS — I10 Essential (primary) hypertension: Secondary | ICD-10-CM | POA: Diagnosis not present

## 2016-01-07 DIAGNOSIS — S81802A Unspecified open wound, left lower leg, initial encounter: Secondary | ICD-10-CM

## 2016-01-07 DIAGNOSIS — E119 Type 2 diabetes mellitus without complications: Secondary | ICD-10-CM | POA: Insufficient documentation

## 2016-01-07 DIAGNOSIS — E11610 Type 2 diabetes mellitus with diabetic neuropathic arthropathy: Secondary | ICD-10-CM | POA: Diagnosis not present

## 2016-01-07 DIAGNOSIS — G4730 Sleep apnea, unspecified: Secondary | ICD-10-CM | POA: Insufficient documentation

## 2016-01-07 DIAGNOSIS — E1140 Type 2 diabetes mellitus with diabetic neuropathy, unspecified: Secondary | ICD-10-CM | POA: Diagnosis not present

## 2016-01-07 DIAGNOSIS — R200 Anesthesia of skin: Secondary | ICD-10-CM

## 2016-01-07 DIAGNOSIS — I509 Heart failure, unspecified: Secondary | ICD-10-CM | POA: Diagnosis not present

## 2016-01-07 DIAGNOSIS — R938 Abnormal findings on diagnostic imaging of other specified body structures: Secondary | ICD-10-CM | POA: Insufficient documentation

## 2016-01-07 DIAGNOSIS — E11621 Type 2 diabetes mellitus with foot ulcer: Secondary | ICD-10-CM | POA: Insufficient documentation

## 2016-01-07 DIAGNOSIS — L97521 Non-pressure chronic ulcer of other part of left foot limited to breakdown of skin: Secondary | ICD-10-CM | POA: Insufficient documentation

## 2016-01-07 DIAGNOSIS — X58XXXA Exposure to other specified factors, initial encounter: Secondary | ICD-10-CM | POA: Diagnosis not present

## 2016-01-07 DIAGNOSIS — E1142 Type 2 diabetes mellitus with diabetic polyneuropathy: Secondary | ICD-10-CM | POA: Insufficient documentation

## 2016-01-07 DIAGNOSIS — I110 Hypertensive heart disease with heart failure: Secondary | ICD-10-CM | POA: Insufficient documentation

## 2016-01-07 DIAGNOSIS — L97421 Non-pressure chronic ulcer of left heel and midfoot limited to breakdown of skin: Secondary | ICD-10-CM | POA: Insufficient documentation

## 2016-01-14 DIAGNOSIS — E11621 Type 2 diabetes mellitus with foot ulcer: Secondary | ICD-10-CM | POA: Diagnosis not present

## 2016-01-15 ENCOUNTER — Ambulatory Visit (HOSPITAL_COMMUNITY)
Admission: RE | Admit: 2016-01-15 | Discharge: 2016-01-15 | Disposition: A | Discharge status: Home / Self Care | Payer: Medicaid Other | Source: Ambulatory Visit | Attending: Urology | Admitting: Urology

## 2016-01-15 ENCOUNTER — Ambulatory Visit: Payer: Self-pay | Admitting: Cardiovascular Disease

## 2016-01-15 DIAGNOSIS — I509 Heart failure, unspecified: Secondary | ICD-10-CM | POA: Insufficient documentation

## 2016-01-15 DIAGNOSIS — E1142 Type 2 diabetes mellitus with diabetic polyneuropathy: Secondary | ICD-10-CM | POA: Diagnosis not present

## 2016-01-15 DIAGNOSIS — I771 Stricture of artery: Secondary | ICD-10-CM | POA: Diagnosis not present

## 2016-01-15 DIAGNOSIS — G4730 Sleep apnea, unspecified: Secondary | ICD-10-CM | POA: Insufficient documentation

## 2016-01-15 DIAGNOSIS — I110 Hypertensive heart disease with heart failure: Secondary | ICD-10-CM | POA: Diagnosis not present

## 2016-01-15 DIAGNOSIS — I70203 Unspecified atherosclerosis of native arteries of extremities, bilateral legs: Secondary | ICD-10-CM | POA: Diagnosis not present

## 2016-01-15 DIAGNOSIS — I739 Peripheral vascular disease, unspecified: Secondary | ICD-10-CM | POA: Insufficient documentation

## 2016-01-18 ENCOUNTER — Encounter: Payer: Self-pay | Admitting: Cardiovascular Disease

## 2016-01-18 ENCOUNTER — Ambulatory Visit (INDEPENDENT_AMBULATORY_CARE_PROVIDER_SITE_OTHER): Payer: Medicaid Other | Admitting: Cardiovascular Disease

## 2016-01-18 VITALS — BP 132/70 | HR 61 | Ht 70.0 in | Wt 260.0 lb

## 2016-01-18 DIAGNOSIS — I10 Essential (primary) hypertension: Secondary | ICD-10-CM

## 2016-01-18 DIAGNOSIS — I998 Other disorder of circulatory system: Secondary | ICD-10-CM | POA: Diagnosis not present

## 2016-01-18 DIAGNOSIS — I70229 Atherosclerosis of native arteries of extremities with rest pain, unspecified extremity: Secondary | ICD-10-CM

## 2016-01-18 NOTE — Patient Instructions (Signed)
Medication Instructions:  Your physician recommends that you continue on your current medications as directed. Please refer to the Current Medication list given to you today.   Follow-Up: Your physician recommends that you schedule a follow-up appointment in: 3 MONTHS WITH DR BERRY.  If you need a refill on your cardiac medications before your next appointment, please call your pharmacy.   

## 2016-01-18 NOTE — Assessment & Plan Note (Signed)
Mr. Alexander Hubbard was [facility] and potential treatment of critical limb ischemia. He has a history of treated diabetes, hypertension and hyperlipidemia. He stepped on a screw 3 months ago and because of fibroid. Neuropathy was unaware of this. He has a slowly healing wound on the bottom of his left foot. He does have moderate renal insufficiency with a creatinine in the 2 range. Lower extremity arterial Doppler studies performed 01/15/15 revealed high-grade disease in his mid right SFA was occluded left SFA and popliteal artery. At this point, I suggest aggressive local care at the wound care center. Revascularization would be high risk given his moderate renal insufficiency and the extent of his occlusion. Should his wounds begin to deteriorate and threaten his limb we will consider angiography and revascularization. I will see back in 3 months.

## 2016-01-18 NOTE — Progress Notes (Signed)
01/18/2016 Virgel Bouquetiyad Stults   1955/12/19  [id number]  Primary Physician Leo GrosserPICKARD,WARREN TOM, MD Primary Cardiologist: Runell GessJonathan J Delita Chiquito MD Roseanne RenoFACP, FACC, FAHA, FSCAI  HPI:  Mr. 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 He was referred by Drs. Everardo Pacificobson and [facility] for peripheral vascular evaluation because of a nonhealing wound on the plantar surface of his left foot. He does have treated hypertension, hypokalemia and diabetes. He has diabetic peripheral neuropathy. He stepped on a screw 3 months ago which went through his shoe. He did not feel this. He's had a slowly healing wound on his left foot. He does have moderate renal insufficiency with creatinines running in the 2 range. Lower extremity arterial Doppler studies performed 01/15/16 revealed high-grade disease in his mid right SFA with tibial vessel disease in the posterior tibial and peroneal and left side as well as an occluded left SFA and popliteal artery.   Current Outpatient Prescriptions  Medication Sig Dispense Refill  . amLODipine (NORVASC) 5 MG tablet Take 1 tablet (5 mg total) by mouth daily. 30 tablet 3  . ammonium lactate (LAC-HYDRIN) 12 % lotion Apply 1 application topically 2 (two) times daily as needed (legs). 400 g 2  . aspirin 81 MG tablet Take 81 mg by mouth daily.    . brinzolamide (AZOPT) 1 % ophthalmic suspension Place 1 drop into the left eye 2 (two) times daily.     . Calcium Citrate-Vitamin D (CALCIUM + D PO) Take by mouth.    . carvedilol (COREG) 3.125 MG tablet Take 3.125 mg by mouth daily.     . ferrous sulfate 325 (65 FE) MG tablet Take 325 mg by mouth daily with breakfast.    . gabapentin (NEURONTIN) 300 MG capsule TAKE 2 CAPSULES BY MOUTH 3 TIMES A DAY 180 capsule 3  . glucose blood (ACCU-CHEK SMARTVIEW) test strip 1 each by Other route See admin instructions. Check blood sugar 3 times daily.    Marland Kitchen. latanoprost (XALATAN) 0.005 %  ophthalmic solution Place 1 drop into the left eye at bedtime.     Marland Kitchen. linagliptin (TRADJENTA) 5 MG TABS tablet Take 5 mg by mouth daily.    . Multiple Vitamins-Minerals (MULTIVITAMIN & MINERAL PO) Take by mouth daily.    . mupirocin ointment (BACTROBAN) 2 % Apply 1 application topically 2 (two) times daily. 22 g 0  . pantoprazole (PROTONIX) 40 MG tablet TAKE 1 TABLET BY MOUTH TWICE A DAY 60 tablet 8  . pentoxifylline (TRENTAL) 400 MG CR tablet Take 400 mg by mouth 2 (two) times daily.    . rosuvastatin (CRESTOR) 20 MG tablet TAKE 1 TABLET BY MOUTH DAILY 30 tablet 3  . timolol (TIMOPTIC) 0.5 % ophthalmic solution Place 1 drop into both eyes 2 (two) times daily.    Marland Kitchen. ULORIC 40 MG tablet Take 1 tablet (40 mg total) by mouth daily. 30 tablet 5  . UNABLE TO FIND Dispense one pair of Diabetic Shoes. ICD E11.49, G62.9, B35.1, R60.9 1 each 0  . vitamin B-12 (CYANOCOBALAMIN) 1000 MCG tablet Take 1,000 mcg by mouth. (Chewable tablet)     No current facility-administered medications for this visit.     Allergies  Allergen Reactions  . Penicillins Swelling and Rash  . Amoxicillin Swelling and Rash  . Niacin Itching and Other (See Comments)    Other reaction(s): Flushing (ALLERGY/intolerance)  . Penicillin G Rash    Social  History   Social History  . Marital status: Married    Spouse name: N/A  . Number of children: N/A  . Years of education: N/A   Occupational History  . Not on file.   Social History Main Topics  . Smoking status: Never Smoker  . Smokeless tobacco: Never Used  . Alcohol use No  . Drug use: No  . Sexual activity: Not on file   Other Topics Concern  . Not on file   Social History Narrative  . No narrative on file     Review of Systems: General: negative for chills, fever, night sweats or weight changes.  Cardiovascular: negative for chest pain, dyspnea on exertion, edema, orthopnea, palpitations, paroxysmal nocturnal dyspnea or shortness of  breath Dermatological: negative for rash Respiratory: negative for cough or wheezing Urologic: negative for hematuria Abdominal: negative for nausea, vomiting, diarrhea, bright red blood per rectum, melena, or hematemesis Neurologic: negative for visual changes, syncope, or dizziness All other systems reviewed and are otherwise negative except as noted above.    Blood pressure 132/70, pulse 61, height 5\' 10"  (1.778 m), weight 260 lb (117.9 kg).  General appearance: alert and no distress Neck: no adenopathy, no carotid bruit, no JVD, supple, symmetrical, trachea midline and thyroid not enlarged, symmetric, no tenderness/mass/nodules Lungs: clear to auscultation bilaterally Heart: regular rate and rhythm, S1, S2 normal, no murmur, click, rub or gallop Extremities: extremities normal, atraumatic, no cyanosis or edema  EKG sinus rhythm at 61 with right bundle branch block. I personally reviewed this EKG  ASSESSMENT AND PLAN:   Critical lower limb ischemia Mr.  Hubbard was [facility] by [facility]ord [facility]son [facility] for evaluation and potential treatment of critical limb ischemia. He has a history of treated diabetes, hypertension and hyperlipidemia. He stepped on a screw 3 months ago and because of fibroid. Neuropathy was unaware of this. He has a slowly healing wound on the bottom of his left foot. He does have moderate renal insufficiency with a creatinine in the 2 range. Lower extremity arterial Doppler studies performed 01/15/15 revealed high-grade disease in his mid right SFA was occluded left SFA and popliteal artery. At this point, I suggest aggressive local care at the wound care center. Revascularization would be high risk given his moderate renal insufficiency and the extent of his occlusion. Should his wounds begin to deteriorate and threaten his limb we will consider angiography and revascularization. I will see back in 3 months.      Runell GessJonathan J. Rosia Syme MD FACP,FACC,FAHA,  [facility] 01/18/2016 10:22 AM

## 2016-01-21 DIAGNOSIS — E11621 Type 2 diabetes mellitus with foot ulcer: Secondary | ICD-10-CM | POA: Diagnosis not present

## 2016-01-22 ENCOUNTER — Ambulatory Visit: Payer: Self-pay | Admitting: Endocrinology

## 2016-01-30 DIAGNOSIS — E11621 Type 2 diabetes mellitus with foot ulcer: Secondary | ICD-10-CM | POA: Diagnosis not present

## 2016-02-03 ENCOUNTER — Other Ambulatory Visit: Payer: Self-pay | Admitting: Family Medicine

## 2016-02-05 ENCOUNTER — Telehealth: Payer: Self-pay | Admitting: Family Medicine

## 2016-02-05 NOTE — Telephone Encounter (Signed)
[facility] calling for MCD NPI number for pt visits.  Approved 6 visits for 6 months and gave them NPI #

## 2016-02-07 ENCOUNTER — Encounter (HOSPITAL_BASED_OUTPATIENT_CLINIC_OR_DEPARTMENT_OTHER): Payer: Medicaid Other | Attending: Internal Medicine

## 2016-02-07 DIAGNOSIS — E1142 Type 2 diabetes mellitus with diabetic polyneuropathy: Secondary | ICD-10-CM | POA: Insufficient documentation

## 2016-02-07 DIAGNOSIS — H409 Unspecified glaucoma: Secondary | ICD-10-CM | POA: Diagnosis not present

## 2016-02-07 DIAGNOSIS — M109 Gout, unspecified: Secondary | ICD-10-CM | POA: Diagnosis not present

## 2016-02-07 DIAGNOSIS — G4730 Sleep apnea, unspecified: Secondary | ICD-10-CM | POA: Insufficient documentation

## 2016-02-07 DIAGNOSIS — E1151 Type 2 diabetes mellitus with diabetic peripheral angiopathy without gangrene: Secondary | ICD-10-CM | POA: Diagnosis not present

## 2016-02-07 DIAGNOSIS — L97521 Non-pressure chronic ulcer of other part of left foot limited to breakdown of skin: Secondary | ICD-10-CM | POA: Diagnosis not present

## 2016-02-07 DIAGNOSIS — I509 Heart failure, unspecified: Secondary | ICD-10-CM | POA: Diagnosis not present

## 2016-02-07 DIAGNOSIS — I110 Hypertensive heart disease with heart failure: Secondary | ICD-10-CM | POA: Insufficient documentation

## 2016-02-07 DIAGNOSIS — E11621 Type 2 diabetes mellitus with foot ulcer: Secondary | ICD-10-CM | POA: Insufficient documentation

## 2016-02-12 ENCOUNTER — Ambulatory Visit (INDEPENDENT_AMBULATORY_CARE_PROVIDER_SITE_OTHER): Payer: Medicaid Other | Admitting: Family Medicine

## 2016-02-12 ENCOUNTER — Telehealth: Payer: Self-pay | Admitting: Family Medicine

## 2016-02-12 DIAGNOSIS — Z23 Encounter for immunization: Secondary | ICD-10-CM

## 2016-02-12 NOTE — Telephone Encounter (Signed)
Patient is asking for rx for his diabetic shoes  If possible to take to his pharmacy  [phone]

## 2016-02-13 ENCOUNTER — Telehealth: Payer: Self-pay | Admitting: Family Medicine

## 2016-02-13 NOTE — Telephone Encounter (Signed)
[facility] calling to get Medicaid NPI # for visits for this patient.  Have left them a message to call back.

## 2016-02-15 ENCOUNTER — Ambulatory Visit (INDEPENDENT_AMBULATORY_CARE_PROVIDER_SITE_OTHER): Payer: Medicaid Other | Admitting: Podiatry

## 2016-02-15 ENCOUNTER — Encounter: Payer: Self-pay | Admitting: Podiatry

## 2016-02-15 DIAGNOSIS — L84 Corns and callosities: Secondary | ICD-10-CM

## 2016-02-15 DIAGNOSIS — B351 Tinea unguium: Secondary | ICD-10-CM | POA: Diagnosis not present

## 2016-02-15 DIAGNOSIS — M79676 Pain in unspecified toe(s): Secondary | ICD-10-CM | POA: Diagnosis not present

## 2016-02-15 DIAGNOSIS — S90415A Abrasion, left lesser toe(s), initial encounter: Secondary | ICD-10-CM

## 2016-02-15 DIAGNOSIS — G6289 Other specified polyneuropathies: Secondary | ICD-10-CM

## 2016-02-15 NOTE — Telephone Encounter (Signed)
Pt will need a DM foot exam as it has been over a year since his last CPE

## 2016-02-15 NOTE — Telephone Encounter (Signed)
RX given to pt for DM shoes

## 2016-02-21 ENCOUNTER — Other Ambulatory Visit: Payer: Self-pay | Admitting: Neurosurgery

## 2016-02-21 DIAGNOSIS — M79605 Pain in left leg: Secondary | ICD-10-CM

## 2016-02-21 DIAGNOSIS — E11621 Type 2 diabetes mellitus with foot ulcer: Secondary | ICD-10-CM | POA: Diagnosis not present

## 2016-02-21 NOTE — Progress Notes (Signed)
Patient ID: Alexander Hubbard, male   DOB: 1955/08/13, 60 y.o.   MRN: [id number]  Subjective: 60 y.o. returns the office today for painful, elongated, thickened toenails which he is unable to trim herself. Denies any redness or drainage around the nails. He has a callus on the left arch. Denies any drainage or pus or any swelling or redness. No other complaints today. Denies any systemic complaints such as fevers, chills, nausea, vomiting.   Objective: AAO 3, NAD DP/PT pulses palpable, CRT less than 3 seconds Protective sensation decreased with Simms Weinstein monofilament Nails hypertrophic, dystrophic, elongated, brittle, discolored 10. There is tenderness overlying the nails 1-5 bilaterally. There is no surrounding erythema or drainage along the nail sites. On the plantar arch on the left foot is hyperkeratotic lesion. Upon debridement no underlying ulceration, drainage or any signs of infection. Superficial abrasion the left fourth toe any surrounding edema, erythema, drainage or any signs of infection. There is no malodor. No other open lesions or pre-ulcerative lesions are identified bilaterally.  No other areas of tenderness bilateral lower extremities. No overlying edema, erythema, increased warmth. No pain with calf compression, swelling, warmth, erythema.  Assessment: Patient presents with symptomatic onychomycosis; pre-ulcerative lesion; toe abrasion  Plan: -Treatment options including alternatives, risks, complications were discussed -Nails sharply debrided 10 without complication/bleeding. -Hyperkeratotic lesion/pre-ulcerative lesion debrided 1 without complications or bleeding. Offloading. -Recommended Neosporin and a bandage the left fourth toe abrasion. Monitor for any signs or symptoms of infection. There is not healing in 2 weeks to call the office. -Follow-up in 3 months or sooner if any problems are to arise. In the meantime, encouraged to call the office with any questions,  concerns, changes symptoms.  Alexander Hubbard, DPM

## 2016-02-22 ENCOUNTER — Other Ambulatory Visit: Payer: Self-pay | Admitting: Physician Assistant

## 2016-02-22 DIAGNOSIS — R112 Nausea with vomiting, unspecified: Secondary | ICD-10-CM

## 2016-02-22 DIAGNOSIS — Z9884 Bariatric surgery status: Secondary | ICD-10-CM

## 2016-02-22 DIAGNOSIS — K219 Gastro-esophageal reflux disease without esophagitis: Secondary | ICD-10-CM

## 2016-02-25 ENCOUNTER — Encounter: Payer: Self-pay | Admitting: "Endocrinology

## 2016-02-25 ENCOUNTER — Ambulatory Visit (INDEPENDENT_AMBULATORY_CARE_PROVIDER_SITE_OTHER): Payer: Medicaid Other | Admitting: "Endocrinology

## 2016-02-25 VITALS — BP 136/80 | HR 61 | Ht 70.0 in | Wt 263.0 lb

## 2016-02-25 DIAGNOSIS — E1122 Type 2 diabetes mellitus with diabetic chronic kidney disease: Secondary | ICD-10-CM | POA: Diagnosis not present

## 2016-02-25 DIAGNOSIS — E785 Hyperlipidemia, unspecified: Secondary | ICD-10-CM

## 2016-02-25 DIAGNOSIS — I10 Essential (primary) hypertension: Secondary | ICD-10-CM

## 2016-02-25 DIAGNOSIS — N183 Chronic kidney disease, stage 3 (moderate): Secondary | ICD-10-CM

## 2016-02-25 LAB — HM DIABETES EYE EXAM

## 2016-02-25 NOTE — Progress Notes (Signed)
Subjective:    Patient ID: Alexander Hubbard, male    DOB: 07-08-1955. Patient is being seen in consultation for management of diabetes requested by  Leo Grosser, MD  Past Medical History:  Diagnosis Date  . AKI (acute kidney injury) (HCC) 07/2015  . CHF (congestive heart failure) (HCC)   . Diabetes mellitus   . Glaucoma (increased eye pressure)   . Gout attack 07/2015   RIGHT HAND LEFT SHOULDER RIGHT KNEE DOWN   . Gouty arthritis   . Hypertension   . Neuropathy of both feet (HCC)   . Obesity   . Sleep apnea   . UTI (urinary tract infection) 07/2015  . Varicose veins    Past Surgical History:  Procedure Laterality Date  . EYE SURGERY    . GASTRIC BYPASS    . KNEE SURGERY     Social History   Social History  . Marital status: Married    Spouse name: N/A  . Number of children: N/A  . Years of education: N/A   Social History Main Topics  . Smoking status: Never Smoker  . Smokeless tobacco: Never Used  . Alcohol use No  . Drug use: No  . Sexual activity: Not Asked   Other Topics Concern  . None   Social History Narrative  . None   Outpatient Encounter Prescriptions as of 02/25/2016  Medication Sig  . amLODipine (NORVASC) 5 MG tablet Take 1 tablet (5 mg total) by mouth daily.  Marland Kitchen aspirin 81 MG tablet Take 81 mg by mouth daily.  . brinzolamide (AZOPT) 1 % ophthalmic suspension Place 1 drop into the left eye 2 (two) times daily.   . Calcium Citrate-Vitamin D (CALCIUM + D PO) Take by mouth.  . carvedilol (COREG) 3.125 MG tablet Take 3.125 mg by mouth daily.   . colchicine 0.6 MG tablet Take 0.6 mg by mouth daily.  Marland Kitchen docusate sodium (COLACE) 100 MG capsule Take 100 mg by mouth 2 (two) times daily.  . ferrous sulfate 325 (65 FE) MG tablet Take 325 mg by mouth daily with breakfast.  . gabapentin (NEURONTIN) 300 MG capsule TAKE 2 CAPSULES BY MOUTH 3 TIMES A DAY  . glucose blood (ACCU-CHEK SMARTVIEW) test strip 1 each by Other route See admin instructions. Check blood  sugar 3 times daily.  Marland Kitchen latanoprost (XALATAN) 0.005 % ophthalmic solution Place 1 drop into the left eye at bedtime.   Marland Kitchen linaclotide (LINZESS) 145 MCG CAPS capsule Take 145 mcg by mouth daily before breakfast.  . Multiple Vitamins-Minerals (MULTIVITAMIN & MINERAL PO) Take by mouth 2 (two) times daily.   . pantoprazole (PROTONIX) 40 MG tablet TAKE 1 TABLET TWICE A DAY  . pentoxifylline (TRENTAL) 400 MG CR tablet Take 400 mg by mouth 3 (three) times daily with meals.  . rosuvastatin (CRESTOR) 20 MG tablet TAKE 1 TABLET BY MOUTH DAILY  . timolol (TIMOPTIC) 0.5 % ophthalmic solution Place 1 drop into both eyes 2 (two) times daily.  Marland Kitchen ULORIC 40 MG tablet Take 1 tablet (40 mg total) by mouth daily.  . valsartan (DIOVAN) 40 MG tablet Take 40 mg by mouth daily.  . vitamin B-12 (CYANOCOBALAMIN) 1000 MCG tablet Take 1,000 mcg by mouth. (Chewable tablet)  . [DISCONTINUED] linagliptin (TRADJENTA) 5 MG TABS tablet Take 5 mg by mouth daily.  Marland Kitchen UNABLE TO FIND Dispense one pair of Diabetic Shoes. ICD E11.49, G62.9, B35.1, R60.9  . [DISCONTINUED] ammonium lactate (LAC-HYDRIN) 12 % lotion Apply 1 application topically 2 (two)  times daily as needed (legs).  . [DISCONTINUED] mupirocin ointment (BACTROBAN) 2 % Apply 1 application topically 2 (two) times daily.  . [DISCONTINUED] pentoxifylline (TRENTAL) 400 MG CR tablet Take 400 mg by mouth 2 (two) times daily.   No facility-administered encounter medications on file as of 02/25/2016.    ALLERGIES: Allergies  Allergen Reactions  . Penicillins Swelling and Rash  . Amoxicillin Swelling and Rash  . Niacin Itching and Other (See Comments)    Other reaction(s): Flushing (ALLERGY/intolerance)  . Penicillin G Rash   VACCINATION STATUS: Immunization History  Administered Date(s) Administered  . Influenza Whole 03/07/2011  . Influenza,inj,Quad PF,36+ Mos 02/17/2013, 02/15/2014, 02/12/2016  . Influenza-Unspecified 03/16/2015  . Pneumococcal Conjugate-13 04/07/2013   . Pneumococcal Polysaccharide-23 01/09/2015    Diabetes  He presents for his initial diabetic visit. He has type 2 diabetes mellitus. Onset time: He was diagnosed at approximate age of 60 years. His disease course has been improving (He underwent bariatric surgery in March 2015. Since then he has A1c under 7%., The last one from July 2017 was 5.8%.). Hypoglycemia symptoms include dizziness. Pertinent negatives for hypoglycemia include no confusion, headaches, pallor or seizures. There are no diabetic associated symptoms. Pertinent negatives for diabetes include no chest pain, no fatigue, no polydipsia, no polyphagia, no polyuria and no weakness. There are no hypoglycemic complications. Symptoms are stable. Diabetic complications include heart disease, impotence, nephropathy and PVD. Risk factors for coronary artery disease include diabetes mellitus, dyslipidemia, hypertension, male sex, obesity and sedentary lifestyle. Current diabetic treatment includes oral agent (monotherapy). His weight is stable (He weighed about 400 pounds before his surgery in 2015. He weighs 263 pounds today.). He is following a generally unhealthy diet. When asked about meal planning, he reported none. He has had a previous visit with a dietitian. He participates in exercise intermittently. His overall blood glucose range is 110-130 mg/dl. An ACE inhibitor/angiotensin II receptor blocker is being taken. He sees a podiatrist.Eye exam is current.  Hyperlipidemia  This is a chronic problem. The current episode started more than 1 year ago. Pertinent negatives include no chest pain, myalgias or shortness of breath. Current antihyperlipidemic treatment includes statins. Risk factors for coronary artery disease include dyslipidemia, diabetes mellitus, hypertension, male sex and obesity.  Hypertension  This is a chronic problem. The current episode started more than 1 year ago. The problem is controlled. Pertinent negatives include no  chest pain, headaches, neck pain, palpitations or shortness of breath. Past treatments include ACE inhibitors. Hypertensive end-organ damage includes kidney disease, CAD/MI and PVD.      Review of Systems  Constitutional: Negative for chills, fatigue, fever and unexpected weight change.  HENT: Negative for dental problem, mouth sores and trouble swallowing.   Eyes: Negative for visual disturbance.  Respiratory: Negative for cough, choking, chest tightness, shortness of breath and wheezing.   Cardiovascular: Negative for chest pain, palpitations and leg swelling.  Gastrointestinal: Negative for abdominal distention, abdominal pain, constipation, diarrhea, nausea and vomiting.  Endocrine: Negative for polydipsia, polyphagia and polyuria.  Genitourinary: Positive for impotence. Negative for dysuria, flank pain, hematuria and urgency.  Musculoskeletal: Negative for back pain, gait problem, myalgias and neck pain.  Skin: Negative for pallor, rash and wound.  Neurological: Positive for dizziness. Negative for seizures, syncope, weakness, numbness and headaches.  Psychiatric/Behavioral: Negative.  Negative for confusion and dysphoric mood.    Objective:    There were no vitals taken for this visit.  Wt Readings from Last 3 Encounters:  01/18/16 260 lb (  117.9 kg)  01/03/16 259 lb (117.5 kg)  12/18/15 259 lb (117.5 kg)    Physical Exam  Constitutional: He is oriented to person, place, and time. He appears well-developed. He is cooperative. No distress.  Obese .  HENT:  Head: Normocephalic and atraumatic.  Edentulous, poor dentition.  Eyes: EOM are normal.  Neck: Normal range of motion. Neck supple. No tracheal deviation present. No thyromegaly present.  Cardiovascular: Normal rate, S1 normal, S2 normal and normal heart sounds.  Exam reveals no gallop.   No murmur heard. Pulses:      Dorsalis pedis pulses are 1+ on the right side, and 1+ on the left side.       Posterior tibial pulses  are 1+ on the right side, and 1+ on the left side.  Pulmonary/Chest: Breath sounds normal. No respiratory distress. He has no wheezes.  Abdominal: Soft. Bowel sounds are normal. He exhibits no distension. There is no tenderness. There is no guarding and no CVA tenderness.  Musculoskeletal: He exhibits no edema.       Right shoulder: He exhibits no swelling and no deformity.  Neurological: He is alert and oriented to person, place, and time. He has normal strength and normal reflexes. No cranial nerve deficit or sensory deficit. Gait normal.  Skin: Skin is warm and dry. No rash noted. No cyanosis. Nails show no clubbing.  Psychiatric: He has a normal mood and affect. His speech is normal and behavior is normal. Judgment and thought content normal. Cognition and memory are normal.     CMP     Component Value Date/Time   NA 142 12/18/2015 0947   K 4.7 12/18/2015 0947   CL 111 (H) 12/18/2015 0947   CO2 20 12/18/2015 0947   GLUCOSE 100 (H) 12/18/2015 0947   BUN 40 (H) 12/18/2015 0947   CREATININE 1.96 (H) 12/18/2015 0947   CALCIUM 8.5 (L) 12/18/2015 0947   PROT 5.9 (L) 12/18/2015 0947   ALBUMIN 3.3 (L) 12/18/2015 0947   AST 13 12/18/2015 0947   ALT 12 12/18/2015 0947   ALKPHOS 52 12/18/2015 0947   BILITOT 0.3 12/18/2015 0947   GFRNONAA 36 (L) 12/18/2015 0947   GFRAA 42 (L) 12/18/2015 0947     Diabetic Labs (most recent): Lab Results  Component Value Date   HGBA1C 5.8 (H) 12/18/2015   HGBA1C 6.5 (H) 07/17/2015   HGBA1C 6.4 (H) 12/26/2014     Lipid Panel ( most recent) Lipid Panel     Component Value Date/Time   CHOL 91 (L) 12/18/2015 0947   TRIG 56 12/18/2015 0947   HDL 40 12/18/2015 0947   CHOLHDL 2.3 12/18/2015 0947   VLDL 11 12/18/2015 0947   LDLCALC 40 12/18/2015 0947   LDLDIRECT 119.0 07/03/2014 1035       Assessment & Plan:   1. Type 2 diabetes mellitus with stage 3 chronic kidney disease, without long-term current use of insulin (HCC)  - Patient has  currently controlled asymptomatic type 2 DM since  60 years of age,  with most recent A1c of 5.8 %. Recent labs reviewed. - Patient's history is such that he had prior course of severely uncontrolled diabetes with A1c as high as 11% requiring various forms of insulin including U500. After he underwent bariatric surgery in March 2015 , he had achieved control to 5.8% currently only on Tradjenta 5 mg by mouth every day.  - His diabetes is complicated by coronary artery disease/CHF, peripheral arterial disease, nephropathy, neuropathy, and patient remains  at a high risk for more acute and chronic complications of diabetes which include CAD, CVA, CKD, retinopathy, and neuropathy. These are all discussed in detail with the patient.  - I have counseled the patient on diet management and weight loss, by adopting a carbohydrate restricted/protein rich diet.  - Suggestion is made for patient to avoid simple carbohydrates   from their diet including Cakes , Desserts, Ice Cream,  Soda (  diet and regular) , Sweet Tea , Candies,  Chips, Cookies, Artificial Sweeteners,   and "Sugar-free" Products . This will help patient to have stable blood glucose profile and potentially avoid unintended weight gain.  - I encouraged the patient to switch to  unprocessed or minimally processed complex starch and increased protein intake (animal or plant source), fruits, and vegetables.  - Patient is advised to stick to a routine mealtimes to eat 3 meals  a day and avoid unnecessary snacks ( to snack only to correct hypoglycemia).  - The patient will be scheduled with Norm Salt, RDN, CDE for individualized DM education.  - I have approached patient with the following individualized plan to manage diabetes and patient agrees:   - Clearly, he has benefited from bariatric surgery. He was able to stop most of his medications for diabetes and maintained good control of diabetes with A1c of 5.8% on the on Tradjenta 5 mg by mouth  daily. - Given the fact that he feels symptoms of hypoglycemia following meals, to give him a chance to come off of even Tradjenta until we repeat his A1c next visit. - No suspicion for endogenous hyperinsulinemia at this time.  -Patient is encouraged to call clinic for blood glucose levels less than 70 or above 200 mg /dl. - He is not a candidate for metformin nor SGLT 2 inhibitors due to CK D.  - Patient specific target  A1c;  LDL, HDL, Triglycerides, and  Waist Circumference were discussed in detail.  2) BP/HTN: Controlled. Continue current medications. He is not on ACE inhibitor/ARB. 3) Lipids/HPL:  uncontrolled, continue statins. 4)  Weight/Diet: He has CDE care in progress. I advised him to continue  , exercise, and detailed carbohydrates information provided.  5) Chronic Care/Health Maintenance:  -Patient is on Statin medications and encouraged to continue to follow up with Ophthalmology, Podiatrist at least yearly or according to recommendations, and advised to   stay away from smoking. I have recommended yearly flu vaccine and pneumonia vaccination at least every 5 years; moderate intensity exercise for up to 150 minutes weekly; and  sleep for at least 7 hours a day.  - 60 minutes of time was spent on the care of this patient , 50% of which was applied for counseling on diabetes complications and their preventions.  - Patient to bring meter and  blood glucose logs during their next visit.   - I advised patient to maintain close follow up with [facility] TOM, MD for primary care needs.  Follow up plan: - Return in about 10 weeks (around 05/05/2016) for follow up with pre-visit labs.  Marquis Lunch, MD Phone: [phone]  Fax: [phone]   02/25/2016, 9:41 AM

## 2016-02-27 ENCOUNTER — Ambulatory Visit
Admission: RE | Admit: 2016-02-27 | Discharge: 2016-02-27 | Disposition: A | Discharge status: Home / Self Care | Payer: Medicaid Other | Source: Ambulatory Visit | Attending: Physician Assistant | Admitting: Physician Assistant

## 2016-02-27 ENCOUNTER — Encounter: Payer: Self-pay | Admitting: Family Medicine

## 2016-02-27 DIAGNOSIS — Z9884 Bariatric surgery status: Secondary | ICD-10-CM

## 2016-02-27 DIAGNOSIS — K219 Gastro-esophageal reflux disease without esophagitis: Secondary | ICD-10-CM

## 2016-02-27 DIAGNOSIS — R112 Nausea with vomiting, unspecified: Secondary | ICD-10-CM

## 2016-02-29 ENCOUNTER — Ambulatory Visit: Payer: Medicaid Other | Admitting: Podiatry

## 2016-03-07 ENCOUNTER — Encounter: Payer: Self-pay | Admitting: Cardiovascular Disease

## 2016-03-10 ENCOUNTER — Encounter (HOSPITAL_BASED_OUTPATIENT_CLINIC_OR_DEPARTMENT_OTHER): Payer: Medicaid Other | Attending: Internal Medicine

## 2016-03-10 DIAGNOSIS — E1140 Type 2 diabetes mellitus with diabetic neuropathy, unspecified: Secondary | ICD-10-CM | POA: Insufficient documentation

## 2016-03-10 DIAGNOSIS — I509 Heart failure, unspecified: Secondary | ICD-10-CM | POA: Insufficient documentation

## 2016-03-10 DIAGNOSIS — Z09 Encounter for follow-up examination after completed treatment for conditions other than malignant neoplasm: Secondary | ICD-10-CM | POA: Insufficient documentation

## 2016-03-10 DIAGNOSIS — G4730 Sleep apnea, unspecified: Secondary | ICD-10-CM | POA: Insufficient documentation

## 2016-03-10 DIAGNOSIS — I10 Essential (primary) hypertension: Secondary | ICD-10-CM | POA: Insufficient documentation

## 2016-03-10 DIAGNOSIS — Z8631 Personal history of diabetic foot ulcer: Secondary | ICD-10-CM | POA: Insufficient documentation

## 2016-03-12 ENCOUNTER — Telehealth: Payer: Self-pay | Admitting: Family Medicine

## 2016-03-12 NOTE — Telephone Encounter (Signed)
Have left message for Alexander Hubbard to find out what prJasmine Decemberocedures are being done and give auth.  She has called back Alexander Hubbard is having  An Endoscopy, esophageal PH study and esophageal manometry.  Needs referral for eval and treat.  [facility]   Gave Gp NPI # for 6 visits/6 months

## 2016-03-12 NOTE — Telephone Encounter (Signed)
[facility] for several [facility]  CPT codes [id number], 91034 and 91010.

## 2016-03-20 DIAGNOSIS — I509 Heart failure, unspecified: Secondary | ICD-10-CM | POA: Diagnosis not present

## 2016-03-20 DIAGNOSIS — E1140 Type 2 diabetes mellitus with diabetic neuropathy, unspecified: Secondary | ICD-10-CM | POA: Diagnosis not present

## 2016-03-20 DIAGNOSIS — I10 Essential (primary) hypertension: Secondary | ICD-10-CM | POA: Diagnosis not present

## 2016-03-20 DIAGNOSIS — Z8631 Personal history of diabetic foot ulcer: Secondary | ICD-10-CM | POA: Diagnosis not present

## 2016-03-20 DIAGNOSIS — E11621 Type 2 diabetes mellitus with foot ulcer: Secondary | ICD-10-CM | POA: Diagnosis present

## 2016-03-20 DIAGNOSIS — G4730 Sleep apnea, unspecified: Secondary | ICD-10-CM | POA: Diagnosis not present

## 2016-03-20 DIAGNOSIS — Z09 Encounter for follow-up examination after completed treatment for conditions other than malignant neoplasm: Secondary | ICD-10-CM | POA: Diagnosis not present

## 2016-04-02 ENCOUNTER — Encounter (HOSPITAL_COMMUNITY): Payer: Self-pay | Admitting: *Deleted

## 2016-04-02 ENCOUNTER — Emergency Department (HOSPITAL_COMMUNITY)
Admission: EM | Admit: 2016-04-02 | Discharge: 2016-04-03 | Disposition: A | Discharge status: Home / Self Care | Payer: Medicaid Other | Attending: Emergency Medicine | Admitting: Emergency Medicine

## 2016-04-02 DIAGNOSIS — W1830XA Fall on same level, unspecified, initial encounter: Secondary | ICD-10-CM | POA: Insufficient documentation

## 2016-04-02 DIAGNOSIS — E11610 Type 2 diabetes mellitus with diabetic neuropathic arthropathy: Secondary | ICD-10-CM | POA: Diagnosis not present

## 2016-04-02 DIAGNOSIS — I130 Hypertensive heart and chronic kidney disease with heart failure and stage 1 through stage 4 chronic kidney disease, or unspecified chronic kidney disease: Secondary | ICD-10-CM | POA: Diagnosis not present

## 2016-04-02 DIAGNOSIS — Z7982 Long term (current) use of aspirin: Secondary | ICD-10-CM | POA: Diagnosis not present

## 2016-04-02 DIAGNOSIS — Y999 Unspecified external cause status: Secondary | ICD-10-CM | POA: Insufficient documentation

## 2016-04-02 DIAGNOSIS — S0990XA Unspecified injury of head, initial encounter: Secondary | ICD-10-CM

## 2016-04-02 DIAGNOSIS — Y9301 Activity, walking, marching and hiking: Secondary | ICD-10-CM | POA: Insufficient documentation

## 2016-04-02 DIAGNOSIS — Y92000 Kitchen of unspecified non-institutional (private) residence as  the place of occurrence of the external cause: Secondary | ICD-10-CM | POA: Insufficient documentation

## 2016-04-02 DIAGNOSIS — R55 Syncope and collapse: Secondary | ICD-10-CM | POA: Diagnosis not present

## 2016-04-02 DIAGNOSIS — E1122 Type 2 diabetes mellitus with diabetic chronic kidney disease: Secondary | ICD-10-CM | POA: Insufficient documentation

## 2016-04-02 DIAGNOSIS — M25512 Pain in left shoulder: Secondary | ICD-10-CM | POA: Diagnosis not present

## 2016-04-02 DIAGNOSIS — N184 Chronic kidney disease, stage 4 (severe): Secondary | ICD-10-CM | POA: Diagnosis not present

## 2016-04-02 DIAGNOSIS — Z79899 Other long term (current) drug therapy: Secondary | ICD-10-CM | POA: Diagnosis not present

## 2016-04-02 DIAGNOSIS — S0003XA Contusion of scalp, initial encounter: Secondary | ICD-10-CM | POA: Diagnosis not present

## 2016-04-02 DIAGNOSIS — I5032 Chronic diastolic (congestive) heart failure: Secondary | ICD-10-CM | POA: Diagnosis not present

## 2016-04-02 LAB — CBC
HCT: 27.8 % — ABNORMAL LOW (ref 39.0–52.0)
HEMOGLOBIN: 9 g/dL — AB (ref 13.0–17.0)
MCH: 27.1 pg (ref 26.0–34.0)
MCHC: 32.4 g/dL (ref 30.0–36.0)
MCV: 83.7 fL (ref 78.0–100.0)
Platelets: 188 10*3/uL (ref 150–400)
RBC: 3.32 MIL/uL — ABNORMAL LOW (ref 4.22–5.81)
RDW: 13.9 % (ref 11.5–15.5)
WBC: 7.5 10*3/uL (ref 4.0–10.5)

## 2016-04-02 LAB — BASIC METABOLIC PANEL
ANION GAP: 8 (ref 5–15)
BUN: 60 mg/dL — AB (ref 6–20)
CALCIUM: 8.9 mg/dL (ref 8.9–10.3)
CO2: 19 mmol/L — AB (ref 22–32)
Chloride: 111 mmol/L (ref 101–111)
Creatinine, Ser: 2.77 mg/dL — ABNORMAL HIGH (ref 0.61–1.24)
GFR calc Af Amer: 27 mL/min — ABNORMAL LOW (ref >60)
GFR calc non Af Amer: 23 mL/min — ABNORMAL LOW (ref >60)
GLUCOSE: 130 mg/dL — AB (ref 65–99)
Potassium: 4.7 mmol/L (ref 3.5–5.1)
Sodium: 138 mmol/L (ref 135–145)

## 2016-04-02 LAB — URINE MICROSCOPIC-ADD ON

## 2016-04-02 LAB — URINALYSIS, ROUTINE W REFLEX MICROSCOPIC
BILIRUBIN URINE: NEGATIVE
Glucose, UA: NEGATIVE mg/dL
HGB URINE DIPSTICK: NEGATIVE
Ketones, ur: NEGATIVE mg/dL
Leukocytes, UA: NEGATIVE
Nitrite: NEGATIVE
PH: 5.5 (ref 5.0–8.0)
Protein, ur: 100 mg/dL — AB
SPECIFIC GRAVITY, URINE: 1.016 (ref 1.005–1.030)

## 2016-04-02 NOTE — ED Provider Notes (Signed)
[facility]-EMERGENCY DEPT Provider Note   CSN: [id number] Arrival date & time: 04/02/16  1951  By signing my name below, I, Clovis PuAvnee Patel, attest that this documentation has been prepared under the direction and in the presence of Azalia BilisKevin Karyssa Amaral, MD  Electronically Signed: Clovis PuAvnee Patel, ED Scribe. 04/02/16. 12:20 AM.   History   Chief Complaint Chief Complaint  Patient presents with  . Loss of Consciousness   The history is provided by the patient. No language interpreter was used.   HPI Comments:  Alexander BouquetRiyad Guyette is a 60 y.o. male who presents to the Emergency Department complaining of a syncopal episode which occurred around 3:30 PM today. Pt states he stood up, felt light-headed, walked a few feet, saw black and fell. Pt notes associated sudden onset, moderate right head pain, left neck pain, and left shoulder pain. Pt states he has been feeling lightheaded when standing up x 2 months. He notes he feels dizzy when he stands up after eating a meal. Pt had a gastric bypass surgery and notes he eats small meals. He has lost ~ 168 pounds since his gastric bypass surgery which was performed in 03/15.  Pt denies hematochezia, melena, abdominal pain, pain to his BLE, and RUE. Pt is followed by a dietician. He has an endoscopy scheduled on 04/23/16. Pt denies any other symptoms or complaints at this time.  Past Medical History:  Diagnosis Date  . AKI (acute kidney injury) (HCC) 07/2015  . CHF (congestive heart failure) (HCC)   . Diabetes mellitus   . Glaucoma (increased eye pressure)   . Gout attack 07/2015   RIGHT HAND LEFT SHOULDER RIGHT KNEE DOWN   . Gouty arthritis   . Hypertension   . Neuropathy of both feet   . Obesity   . Sleep apnea   . UTI (urinary tract infection) 07/2015  . Varicose veins     Patient Active Problem List   Diagnosis Date Noted  . Critical lower limb ischemia 01/18/2016  . Acute gout   . CKD (chronic kidney disease) stage 3, GFR 30-59 ml/min 07/18/2015  . Acute  renal failure (HCC) 07/18/2015  . AKI (acute kidney injury) (HCC) 07/17/2015  . Diabetes mellitus with complication (HCC) 07/17/2015  . Gout attack 07/17/2015  . PVD (peripheral vascular disease) (HCC) 07/17/2015  . Abnormal urine 07/17/2015  . Protruded cervical disc 09/21/2014  . Neck pain 09/21/2014  . Headache 08/03/2014  . Accidental drug overdose 09/19/2013  . Syncope 08/04/2013  . Acute bronchitis 06/22/2013  . Swelling of limb 05/13/2013  . Pain in limb 05/13/2013  . Varicose veins of lower extremities with other complications 05/13/2013  . Chronic venous insufficiency 05/13/2013  . Chronic kidney disease, stage III (moderate) 05/12/2013  . Hypoxemia 03/31/2013  . Chronic kidney disease, stage IV (severe) (HCC) 03/30/2013  . SIRS (systemic inflammatory response syndrome) (HCC) 03/30/2013  . Diarrhea 03/30/2013  . DM2 (diabetes mellitus, type 2) (HCC) 01/05/2013  . Chest pain 09/22/2012  . IDDM (insulin dependent diabetes mellitus) (HCC)   . Glaucoma   . OSA (obstructive sleep apnea)   . Gout   . Diastolic CHF, chronic (HCC)   . Essential hypertension, benign   . Hyperlipidemia   . Peripheral neuropathy (HCC)   . Morbid obesity (HCC)   . Renal failure, chronic   . Peripheral edema     Past Surgical History:  Procedure Laterality Date  . EYE SURGERY    . GASTRIC BYPASS    . KNEE SURGERY  Home Medications    Prior to Admission medications   Medication Sig Start Date End Date Taking? Authorizing Provider  amLODipine (NORVASC) 5 MG tablet Take 1 tablet (5 mg total) by mouth daily. 08/04/14   Ripudeep Jenna Luo, MD  aspirin 81 MG tablet Take 81 mg by mouth daily.    Historical Provider, MD  brinzolamide (AZOPT) 1 % ophthalmic suspension Place 1 drop into the left eye 2 (two) times daily.     Historical Provider, MD  Calcium Citrate-Vitamin D (CALCIUM + D PO) Take by mouth.    Historical Provider, MD  carvedilol (COREG) 3.125 MG tablet Take 3.125 mg by mouth  daily.     Historical Provider, MD  colchicine 0.6 MG tablet Take 0.6 mg by mouth daily.    Historical Provider, MD  docusate sodium (COLACE) 100 MG capsule Take 100 mg by mouth 2 (two) times daily.    Historical Provider, MD  ferrous sulfate 325 (65 FE) MG tablet Take 325 mg by mouth daily with breakfast.    Historical Provider, MD  gabapentin (NEURONTIN) 300 MG capsule TAKE 2 CAPSULES BY MOUTH 3 TIMES A DAY 01/04/16   Donita Brooks, MD  glucose blood (ACCU-CHEK SMARTVIEW) test strip 1 each by Other route See admin instructions. Check blood sugar 3 times daily.    Historical Provider, MD  latanoprost (XALATAN) 0.005 % ophthalmic solution Place 1 drop into the left eye at bedtime.     Historical Provider, MD  linaclotide (LINZESS) 145 MCG CAPS capsule Take 145 mcg by mouth daily before breakfast.    Historical Provider, MD  Multiple Vitamins-Minerals (MULTIVITAMIN & MINERAL PO) Take by mouth 2 (two) times daily.     Historical Provider, MD  pantoprazole (PROTONIX) 40 MG tablet TAKE 1 TABLET TWICE A DAY 02/05/16   Donita Brooks, MD  pentoxifylline (TRENTAL) 400 MG CR tablet Take 400 mg by mouth 3 (three) times daily with meals.    Historical Provider, MD  rosuvastatin (CRESTOR) 20 MG tablet TAKE 1 TABLET BY MOUTH DAILY 05/17/15   Donita Brooks, MD  timolol (TIMOPTIC) 0.5 % ophthalmic solution Place 1 drop into both eyes 2 (two) times daily.    Historical Provider, MD  ULORIC 40 MG tablet Take 1 tablet (40 mg total) by mouth daily. 08/16/15   Donita Brooks, MD  UNABLE TO FIND Dispense one pair of Diabetic Shoes. ICD E11.49, G62.9, B35.1, R60.9 06/12/14   Donita Brooks, MD  valsartan (DIOVAN) 40 MG tablet Take 40 mg by mouth daily.    Historical Provider, MD  vitamin B-12 (CYANOCOBALAMIN) 1000 MCG tablet Take 1,000 mcg by mouth. (Chewable tablet)    Historical Provider, MD    Family History Family History  Problem Relation Age of Onset  . Hypertension Sister   . Diabetes Mellitus II  Sister     Social History Social History  Substance Use Topics  . Smoking status: Never Smoker  . Smokeless tobacco: Never Used  . Alcohol use No     Allergies   Penicillins; Amoxicillin; Niacin; and Penicillin g   Review of Systems Review of Systems  Gastrointestinal: Negative for abdominal pain and blood in stool.  Musculoskeletal: Positive for arthralgias and neck pain.  Skin: Positive for wound.  Neurological: Positive for dizziness, syncope and light-headedness.   10 systems reviewed and all are negative for acute change except as noted in the HPI.  Physical Exam Updated Vital Signs BP 118/59 (BP Location: Right Arm)  Pulse (!) 57   Temp 98.5 F (36.9 C) (Oral)   Resp 19   Ht 5\' 9"  (1.753 m)   Wt 259 lb (117.5 kg)   SpO2 98%   BMI 38.25 kg/m   Physical Exam  Constitutional: He is oriented to person, place, and time. He appears well-developed and well-nourished.  Eyes: EOM are normal.  Neck: Neck supple.  Neck immobilized in cervical collar with midline cervical and paracervical tenderness   Cardiovascular: Normal rate, regular rhythm, normal heart sounds and intact distal pulses.   Pulmonary/Chest: Effort normal and breath sounds normal. No respiratory distress.  Abdominal: Soft. He exhibits no distension. There is no tenderness.  Musculoskeletal: Normal range of motion. He exhibits tenderness. He exhibits no deformity.  FROM of bilateral hip, knees and ankles. FROM of R shoulder an R elbow Normal ROM of L elbow Painful ROM of L shoulder without deformity  Mild tenderness at left AC joint.  Neurological: He is alert and oriented to person, place, and time.  Skin: Skin is warm and dry.  Small R parietal scalt hematoma  Psychiatric: He has a normal mood and affect. Judgment normal.  Nursing note and vitals reviewed.    ED Treatments / Results  DIAGNOSTIC STUDIES:  Oxygen Saturation is 98% on RA, normal by my interpretation.    COORDINATION OF  CARE:  12:07 AM Discussed treatment plan with pt at bedside and pt agreed to plan.  Labs (all labs ordered are listed, but only abnormal results are displayed) Labs Reviewed  BASIC METABOLIC PANEL - Abnormal; Notable for the following:       Result Value   CO2 19 (*)    Glucose, Bld 130 (*)    BUN 60 (*)    Creatinine, Ser 2.77 (*)    GFR calc non Af Amer 23 (*)    GFR calc Af Amer 27 (*)    All other components within normal limits  CBC - Abnormal; Notable for the following:    RBC 3.32 (*)    Hemoglobin 9.0 (*)    HCT 27.8 (*)    All other components within normal limits  URINALYSIS, ROUTINE W REFLEX MICROSCOPIC (NOT AT [facility]) - Abnormal; Notable for the following:    Protein, ur 100 (*)    All other components within normal limits  URINE MICROSCOPIC-ADD ON - Abnormal; Notable for the following:    Squamous Epithelial / LPF 6-30 (*)    Bacteria, UA RARE (*)    Casts HYALINE CASTS (*)    All other components within normal limits  RETICULOCYTES - Abnormal; Notable for the following:    RBC. 3.46 (*)    All other components within normal limits  CBG MONITORING, ED - Abnormal; Notable for the following:    Glucose-Capillary 140 (*)    All other components within normal limits  VITAMIN B12  FOLATE  IRON AND TIBC  FERRITIN    BUN  Date Value Ref Range Status  04/02/2016 60 (H) 6 - 20 mg/dL Final  62/13/086507/18/2017 40 (H) 7 - 25 mg/dL Final  78/46/962902/16/2017 69 (H) 6 - 20 mg/dL Final  52/84/132402/15/2017 71 (H) 6 - 20 mg/dL Final   Creat  Date Value Ref Range Status  12/18/2015 1.96 (H) 0.70 - 1.25 mg/dL Final    Comment:      For patients > or = 60 years of age: The upper reference limit for Creatinine is approximately 13% higher for people identified as African-American.  12/26/2014 1.60 (H) 0.70 - 1.33 mg/dL Final  16/03/9603 5.40 0.50 - 1.35 mg/dL Final  98/04/9146 8.29 (H) 0.50 - 1.35 mg/dL Final   Creatinine, Ser  Date Value Ref Range Status  04/02/2016 2.77 (H) 0.61 -  1.24 mg/dL Final  56/21/3086 5.78 (H) 0.61 - 1.24 mg/dL Final  46/96/2952 8.41 (H) 0.61 - 1.24 mg/dL Final  32/44/0102 7.25 (H) 0.61 - 1.24 mg/dL Final   Hemoglobin  Date Value Ref Range Status  04/02/2016 9.0 (L) 13.0 - 17.0 g/dL Final  36/64/4034 9.9 (L) 13.0 - 17.0 g/dL Final  74/25/9563 9.1 (L) 13.0 - 17.0 g/dL Final  87/56/4332 9.9 (L) 13.0 - 17.0 g/dL Final      EKG  EKG Interpretation  Date/Time:  Wednesday April 02 2016 20:08:47 EDT Ventricular Rate:  64 PR Interval:  200 QRS Duration: 146 QT Interval:  422 QTC Calculation: 435 R Axis:   16 Text Interpretation:  Normal sinus rhythm Right bundle branch block Abnormal ECG No significant change was found Confirmed by Zaryan Yakubov  MD, Caryn Bee ([id number]) on 04/02/2016 11:43:14 PM       Radiology Ct Head Wo Contrast  Result Date: 04/03/2016 CLINICAL DATA:  Syncope tonight, falling backwards and striking his head. EXAM: CT HEAD WITHOUT CONTRAST CT CERVICAL SPINE WITHOUT CONTRAST TECHNIQUE: Multidetector CT imaging of the head and cervical spine was performed following the standard protocol without intravenous contrast. Multiplanar CT image reconstructions of the cervical spine were also generated. COMPARISON:  04/01/2013 FINDINGS: CT HEAD FINDINGS Brain: No evidence of acute infarction, hemorrhage, hydrocephalus, extra-axial collection or mass lesion/mass effect. There is mild to moderate generalized atrophy. There is white matter hypodensity consistent with chronic small vessel ischemic disease. Vascular: No hyperdense vessel or unexpected calcification. Skull: Normal. Negative for fracture or focal lesion. Sinuses/Orbits: No acute finding. CT CERVICAL SPINE FINDINGS Alignment: Normal. Skull base and vertebrae: No acute fracture. No primary bone lesion or focal pathologic process. Soft tissues and spinal canal: No prevertebral fluid or swelling. No visible canal hematoma. Disc levels:  Mild-to-moderate degenerative disc changes. Upper  chest: No significant abnormality IMPRESSION: 1. No acute intracranial findings. There is moderate generalized atrophy and chronic appearing white matter hypodensities which likely represent small vessel ischemic disease. 2. Negative for acute cervical spine fracture Electronically Signed   By: Ellery Plunk M.D.   On: 04/03/2016 01:44   Ct Cervical Spine Wo Contrast  Result Date: 04/03/2016 CLINICAL DATA:  Syncope tonight, falling backwards and striking his head. EXAM: CT HEAD WITHOUT CONTRAST CT CERVICAL SPINE WITHOUT CONTRAST TECHNIQUE: Multidetector CT imaging of the head and cervical spine was performed following the standard protocol without intravenous contrast. Multiplanar CT image reconstructions of the cervical spine were also generated. COMPARISON:  04/01/2013 FINDINGS: CT HEAD FINDINGS Brain: No evidence of acute infarction, hemorrhage, hydrocephalus, extra-axial collection or mass lesion/mass effect. There is mild to moderate generalized atrophy. There is white matter hypodensity consistent with chronic small vessel ischemic disease. Vascular: No hyperdense vessel or unexpected calcification. Skull: Normal. Negative for fracture or focal lesion. Sinuses/Orbits: No acute finding. CT CERVICAL SPINE FINDINGS Alignment: Normal. Skull base and vertebrae: No acute fracture. No primary bone lesion or focal pathologic process. Soft tissues and spinal canal: No prevertebral fluid or swelling. No visible canal hematoma. Disc levels:  Mild-to-moderate degenerative disc changes. Upper chest: No significant abnormality IMPRESSION: 1. No acute intracranial findings. There is moderate generalized atrophy and chronic appearing white matter hypodensities which likely represent small vessel ischemic disease. 2. Negative for acute  cervical spine fracture Electronically Signed   By: Ellery Plunk M.D.   On: 04/03/2016 01:44   Dg Shoulder Left  Result Date: 04/03/2016 CLINICAL DATA:  Larey Seat and hit head, with  left shoulder pain. Initial encounter. EXAM: LEFT SHOULDER - 2+ VIEW COMPARISON:  None. FINDINGS: There is no evidence of fracture or dislocation. The left humeral head is seated within the glenoid fossa. The acromioclavicular joint is unremarkable in appearance. No significant soft tissue abnormalities are seen. The visualized portions of the left lung are clear. IMPRESSION: No evidence of fracture or dislocation. Electronically Signed   By: Roanna Raider M.D.   On: 04/03/2016 01:13    Procedures Procedures (including critical care time)  Medications Ordered in ED Medications  sodium chloride 0.9 % bolus 1,000 mL (1,000 mLs Intravenous Refused 04/03/16 0059)  oxyCODONE-acetaminophen (PERCOCET/ROXICET) 5-325 MG per tablet 1 tablet (1 tablet Oral Given 04/03/16 0023)     Initial Impression / Assessment and Plan / ED Course  I have reviewed the triage vital signs and the nursing notes.  Pertinent labs & imaging results that were available during my care of the patient were reviewed by me and considered in my medical decision making (see chart for details).  Clinical Course   Mild elevation in his BUN and creatinine.  Patient feels confident he is drinking enough fluids.  Mild anemia without significant change from his baseline.  Overall well-appearing.  Astigmatic this time.  Orthostatics are normal.  No acute traumatic injury noted on CT head, C-spine as well as x-ray of the left shoulder.  Patient feels better.  Ambulatory.  Discharge home in good condition.  Primary care follow-up.  Doubt arrhythmia.   Final Clinical Impressions(s) / ED Diagnoses   Final diagnoses:  None    New Prescriptions New Prescriptions   No medications on file   I personally performed the services described in this documentation, which was scribed in my presence. The recorded information has been reviewed and is accurate.        Azalia Bilis, MD 04/03/16 [phone]

## 2016-04-02 NOTE — ED Triage Notes (Signed)
The pt is c/o fainting this afternoon he was walking across the floor and he fainted he reports that he was unconscious for 10 minutes  He has a small cut to the top of his head,  At present alert no distress he has no history of this

## 2016-04-03 ENCOUNTER — Emergency Department (HOSPITAL_COMMUNITY): Payer: Medicaid Other

## 2016-04-03 LAB — IRON AND TIBC
Iron: 56 ug/dL (ref 45–182)
SATURATION RATIOS: 22 % (ref 17.9–39.5)
TIBC: 259 ug/dL (ref 250–450)
UIBC: 203 ug/dL

## 2016-04-03 LAB — FOLATE: FOLATE: 40.2 ng/mL (ref >5.9)

## 2016-04-03 LAB — RETICULOCYTES
RBC.: 3.46 MIL/uL — AB (ref 4.22–5.81)
Retic Count, Absolute: 38.1 10*3/uL (ref 19.0–186.0)
Retic Ct Pct: 1.1 % (ref 0.4–3.1)

## 2016-04-03 LAB — FERRITIN: Ferritin: 70 ng/mL (ref 24–336)

## 2016-04-03 LAB — CBG MONITORING, ED: Glucose-Capillary: 140 mg/dL — ABNORMAL HIGH (ref 65–99)

## 2016-04-03 LAB — VITAMIN B12: Vitamin B-12: 1610 pg/mL — ABNORMAL HIGH (ref 180–914)

## 2016-04-03 MED ORDER — OXYCODONE-ACETAMINOPHEN 5-325 MG PO TABS
1.0000 | ORAL_TABLET | Freq: Once | ORAL | Status: AC
  Administered 2016-04-03: 1 via ORAL
  Filled 2016-04-03: qty 1

## 2016-04-03 MED ORDER — SODIUM CHLORIDE 0.9 % IV BOLUS (SEPSIS): 1000.0000 mL | Freq: Once | INTRAVENOUS | Status: DC

## 2016-04-03 NOTE — ED Notes (Signed)
Pt transported to CT ?

## 2016-04-03 NOTE — ED Notes (Signed)
Applied Bacitracin to scraps on pt head

## 2016-04-03 NOTE — ED Notes (Signed)
Pt states he was walking from the couch to the kitchen and he started to black out. He tried to catch him self by grabbing the counter but still fell back and hit his left shoulder and top of his head. Pt does not recall if he fully blacked out.

## 2016-04-04 ENCOUNTER — Ambulatory Visit (INDEPENDENT_AMBULATORY_CARE_PROVIDER_SITE_OTHER): Payer: Medicaid Other | Admitting: Family Medicine

## 2016-04-04 ENCOUNTER — Encounter: Payer: Self-pay | Admitting: Family Medicine

## 2016-04-04 VITALS — BP 130/74 | HR 72 | Temp 99.0°F | Resp 18 | Ht 68.0 in | Wt 265.0 lb

## 2016-04-04 DIAGNOSIS — D631 Anemia in chronic kidney disease: Secondary | ICD-10-CM

## 2016-04-04 DIAGNOSIS — N183 Chronic kidney disease, stage 3 unspecified: Secondary | ICD-10-CM

## 2016-04-04 NOTE — Progress Notes (Signed)
Subjective:    Patient ID: Alexander Hubbard, male    DOB: 08-15-1955, 60 y.o.   MRN: [id number]  HPI Was seen in emergency room on November 1. Stood up from his recliner quickly and started to walk. Became lightheaded and passed out. Struck his head sustaining a superficial laceration to his crown. Bruised his left ribs posteriorly. Emergency room evaluation was significant for an elevation in his creatinine from 1.9 which is the baseline to 2.7. Hemoglobin was also low at 9.0. This time last year he was 10.8. He is taking his B12 as well as his iron tablets. He denies any blood loss. Chronic kidney disease does seem to be worsening. Past Medical History:  Diagnosis Date  . AKI (acute kidney injury) (HCC) 07/2015  . CHF (congestive heart failure) (HCC)   . Diabetes mellitus   . Glaucoma (increased eye pressure)   . Gout attack 07/2015   RIGHT HAND LEFT SHOULDER RIGHT KNEE DOWN   . Gouty arthritis   . Hypertension   . Neuropathy of both feet   . Obesity   . Sleep apnea   . UTI (urinary tract infection) 07/2015  . Varicose veins    Past Surgical History:  Procedure Laterality Date  . EYE SURGERY    . GASTRIC BYPASS    . KNEE SURGERY     Current Outpatient Prescriptions on File Prior to Visit  Medication Sig Dispense Refill  . amLODipine (NORVASC) 5 MG tablet Take 1 tablet (5 mg total) by mouth daily. 30 tablet 3  . aspirin 81 MG tablet Take 81 mg by mouth daily.    . brinzolamide (AZOPT) 1 % ophthalmic suspension Place 1 drop into the left eye 2 (two) times daily.     . Calcium Citrate-Vitamin D (CALCIUM + D PO) Take 1 tablet by mouth daily.     . carvedilol (COREG) 3.125 MG tablet Take 3.125 mg by mouth daily.     . colchicine 0.6 MG tablet Take 0.6 mg by mouth daily.    Marland Kitchen. docusate sodium (COLACE) 100 MG capsule Take 100 mg by mouth 2 (two) times daily.    . ferrous sulfate 325 (65 FE) MG tablet Take 325 mg by mouth daily with breakfast.    . gabapentin (NEURONTIN) 300 MG capsule  TAKE 2 CAPSULES BY MOUTH 3 TIMES A DAY 180 capsule 3  . glucose blood (ACCU-CHEK SMARTVIEW) test strip 1 each by Other route See admin instructions. Check blood sugar 3 times daily.    Marland Kitchen. latanoprost (XALATAN) 0.005 % ophthalmic solution Place 1 drop into the left eye at bedtime.     Marland Kitchen. linaclotide (LINZESS) 145 MCG CAPS capsule Take 145 mcg by mouth daily before breakfast.    . Multiple Vitamins-Minerals (MULTIVITAMIN & MINERAL PO) Take 1 tablet by mouth 2 (two) times daily.     . pantoprazole (PROTONIX) 40 MG tablet TAKE 1 TABLET TWICE A DAY 60 tablet 8  . pentoxifylline (TRENTAL) 400 MG CR tablet Take 400 mg by mouth 3 (three) times daily with meals.    . rosuvastatin (CRESTOR) 20 MG tablet TAKE 1 TABLET BY MOUTH DAILY 30 tablet 3  . sodium bicarbonate 650 MG tablet Take 650 mg by mouth 2 (two) times daily.    . timolol (TIMOPTIC) 0.5 % ophthalmic solution Place 1 drop into both eyes 2 (two) times daily.    . TRADJENTA 5 MG TABS tablet Take 5 mg by mouth daily.  11  . ULORIC 40 MG tablet  Take 1 tablet (40 mg total) by mouth daily. 30 tablet 5  . UNABLE TO FIND Dispense one pair of Diabetic Shoes. ICD E11.49, G62.9, B35.1, R60.9 1 each 0  . valsartan (DIOVAN) 40 MG tablet Take 40 mg by mouth daily.    . vitamin B-12 (CYANOCOBALAMIN) 1000 MCG tablet Take 1,000 mcg by mouth. (Chewable tablet)     No current facility-administered medications on file prior to visit.    Allergies  Allergen Reactions  . Penicillins Swelling and Rash  . Amoxicillin Swelling and Rash  . Niacin Itching and Other (See Comments)    Other reaction(s): Flushing (ALLERGY/intolerance)  . Penicillin G Rash   Social History   Social History  . Marital status: Married    Spouse name: N/A  . Number of children: N/A  . Years of education: N/A   Occupational History  . Not on file.   Social History Main Topics  . Smoking status: Never Smoker  . Smokeless tobacco: Never Used  . Alcohol use No  . Drug use: No  .  Sexual activity: Not on file   Other Topics Concern  . Not on file   Social History Narrative  . No narrative on file      Review of Systems  All other systems reviewed and are negative.      Objective:   Physical Exam  Constitutional: He appears well-developed and well-nourished.  Cardiovascular: Normal rate, regular rhythm and normal heart sounds.   Pulmonary/Chest: Effort normal and breath sounds normal. No respiratory distress. He has no wheezes. He has no rales.  Abdominal: Soft. Bowel sounds are normal.  Musculoskeletal: He exhibits edema.  Vitals reviewed.         Assessment & Plan:  Anemia in stage 3 chronic kidney disease - Plan: CBC with Differential/Platelet, BASIC METABOLIC PANEL WITH GFR, Fecal occult blood, imunochemical, Fecal occult blood, imunochemical, Fecal occult blood, imunochemical  Patient lost consciousness due to a drop in his blood pressure upon standing. Lab work was concerning for worsening renal function as a sign of dehydration and also worsening anemia. Certainly has a reason to be anemic given his chronic kidney disease. However I feel we need to rule out GI blood loss. Therefore 1 have the patient discontinue his iron for 48 hours and then perform fecal occult blood cards 3. I will also recheck his renal function and his hemoglobin to ensure that they are not deteriorating. May need to consider discontinuing antihypertensive medication if he continues to have orthostatic dizziness.

## 2016-04-05 LAB — CBC WITH DIFFERENTIAL/PLATELET
BASOS PCT: 0 %
Basophils Absolute: 0 cells/uL (ref 0–200)
Eosinophils Absolute: 140 cells/uL (ref 15–500)
Eosinophils Relative: 2 %
HEMATOCRIT: 27.9 % — AB (ref 38.5–50.0)
HEMOGLOBIN: 9 g/dL — AB (ref 13.0–17.0)
LYMPHS ABS: 1820 {cells}/uL (ref 850–3900)
Lymphocytes Relative: 26 %
MCH: 27.5 pg (ref 27.0–33.0)
MCHC: 32.3 g/dL (ref 32.0–36.0)
MCV: 85.3 fL (ref 80.0–100.0)
MONO ABS: 280 {cells}/uL (ref 200–950)
MPV: 10.8 fL (ref 7.5–12.5)
Monocytes Relative: 4 %
Neutro Abs: 4760 cells/uL (ref 1500–7800)
Neutrophils Relative %: 68 %
Platelets: 175 10*3/uL (ref 140–400)
RBC: 3.27 MIL/uL — AB (ref 4.20–5.80)
RDW: 14.8 % (ref 11.0–15.0)
WBC: 7 10*3/uL (ref 3.8–10.8)

## 2016-04-05 LAB — BASIC METABOLIC PANEL WITH GFR
BUN: 58 mg/dL — ABNORMAL HIGH (ref 7–25)
CO2: 22 mmol/L (ref 20–31)
Calcium: 8.3 mg/dL — ABNORMAL LOW (ref 8.6–10.3)
Chloride: 109 mmol/L (ref 98–110)
Creat: 2.12 mg/dL — ABNORMAL HIGH (ref 0.70–1.25)
GFR, EST NON AFRICAN AMERICAN: 33 mL/min — AB (ref >=60)
GFR, Est African American: 38 mL/min — ABNORMAL LOW (ref >=60)
GLUCOSE: 187 mg/dL — AB (ref 70–99)
POTASSIUM: 4.5 mmol/L (ref 3.5–5.3)
Sodium: 140 mmol/L (ref 135–146)

## 2016-04-11 ENCOUNTER — Ambulatory Visit (INDEPENDENT_AMBULATORY_CARE_PROVIDER_SITE_OTHER): Payer: Medicaid Other | Admitting: Podiatry

## 2016-04-11 ENCOUNTER — Encounter: Payer: Self-pay | Admitting: Podiatry

## 2016-04-11 ENCOUNTER — Ambulatory Visit: Payer: Self-pay | Admitting: Cardiovascular Disease

## 2016-04-11 DIAGNOSIS — M7741 Metatarsalgia, right foot: Secondary | ICD-10-CM

## 2016-04-11 DIAGNOSIS — B351 Tinea unguium: Secondary | ICD-10-CM

## 2016-04-11 DIAGNOSIS — I739 Peripheral vascular disease, unspecified: Secondary | ICD-10-CM | POA: Diagnosis not present

## 2016-04-11 DIAGNOSIS — M79676 Pain in unspecified toe(s): Secondary | ICD-10-CM

## 2016-04-11 DIAGNOSIS — G6289 Other specified polyneuropathies: Secondary | ICD-10-CM

## 2016-04-11 DIAGNOSIS — M7742 Metatarsalgia, left foot: Secondary | ICD-10-CM

## 2016-04-11 NOTE — Progress Notes (Signed)
Patient ID: Virgel BouquetRiyad Quiett, male   DOB: Jan 21, 1956, 60 y.o.   MRN: [id number]  Subjective: 60 y.o. returns the office today for painful, elongated, thickened toenails which he is unable to trim herself. Denies any redness or drainage around the nails. He is awaiting diabetic shoe. He only had angioplasty performed. He also has neuropathy. Denies any drainage or pus or any swelling or redness. No other complaints today. Denies any systemic complaints such as fevers, chills, nausea, vomiting.   Objective: AAO 3, NAD DP/PT pulses palpable 1/4, CRT less than 3 seconds Protective sensation decreased with Simms Weinstein monofilament Nails hypertrophic, dystrophic, elongated, brittle, discolored 10. There is tenderness overlying the nails 1-5 bilaterally. There is no surrounding erythema or drainage along the nail sites. On the plantar arch on the left foot is hyperkeratotic lesion. Upon debridement no underlying ulceration, drainage or any signs of infection. Prominent metatarsal heads b/l with atrophy of the fat pad No other open lesions or pre-ulcerative lesions are identified bilaterally.  No other areas of tenderness bilateral lower extremities. No overlying edema, erythema, increased warmth. No pain with calf compression, swelling, warmth, erythema.  Assessment: Patient presents with symptomatic onychomycosis; pre-ulcerative lesion; toe abrasion  Plan: -Treatment options including alternatives, risks, complications were discussed -Nails sharply debrided 10 without complication/bleeding. -Metatarsal offloading pads.  -Awaiting diabetic shoes. -Follow-up in 3 months or sooner if any problems are to arise. In the meantime, encouraged to call the office with any questions, concerns, changes symptoms.  Ovid CurdMatthew Wagoner, DPM

## 2016-04-16 ENCOUNTER — Ambulatory Visit: Payer: Medicaid Other | Admitting: Cardiovascular Disease

## 2016-04-23 ENCOUNTER — Ambulatory Visit: Payer: Medicaid Other | Admitting: "Endocrinology

## 2016-05-03 ENCOUNTER — Other Ambulatory Visit: Payer: Self-pay | Admitting: Family Medicine

## 2016-05-05 ENCOUNTER — Ambulatory Visit: Payer: Medicaid Other | Admitting: "Endocrinology

## 2016-05-08 ENCOUNTER — Inpatient Hospital Stay: Payer: Medicaid Other | Admitting: Family Medicine

## 2016-05-11 ENCOUNTER — Other Ambulatory Visit: Payer: Self-pay | Admitting: Family Medicine

## 2016-05-16 ENCOUNTER — Ambulatory Visit: Payer: Medicaid Other | Admitting: Podiatry

## 2016-05-19 ENCOUNTER — Ambulatory Visit: Payer: Medicaid Other | Admitting: "Endocrinology

## 2016-05-20 ENCOUNTER — Ambulatory Visit: Payer: Medicaid Other | Admitting: "Endocrinology

## 2016-05-22 ENCOUNTER — Encounter: Payer: Self-pay | Admitting: Family Medicine

## 2016-06-03 ENCOUNTER — Telehealth: Payer: Self-pay | Admitting: Family Medicine

## 2016-06-03 NOTE — Telephone Encounter (Signed)
Patient is calling to talk with you regarding some bloodwork that he needs from another physician  [phone]

## 2016-06-04 NOTE — Telephone Encounter (Signed)
Spoke to pt and he has some BW from his DM MD and wanted to know if he can have done here. He has also been really sick but only wanted to see MD. No MD in office today and appt made for tomorrow and to do BW tomorrow as well in case [facility] wanted to order additional tests.

## 2016-06-05 ENCOUNTER — Encounter: Payer: Self-pay | Admitting: Family Medicine

## 2016-06-05 ENCOUNTER — Ambulatory Visit (INDEPENDENT_AMBULATORY_CARE_PROVIDER_SITE_OTHER): Payer: Medicaid Other | Admitting: Family Medicine

## 2016-06-05 ENCOUNTER — Other Ambulatory Visit: Payer: Self-pay | Admitting: Family Medicine

## 2016-06-05 VITALS — BP 100/64 | HR 70 | Temp 98.5°F | Resp 18 | Ht 68.0 in | Wt 254.0 lb

## 2016-06-05 DIAGNOSIS — E119 Type 2 diabetes mellitus without complications: Secondary | ICD-10-CM | POA: Diagnosis not present

## 2016-06-05 DIAGNOSIS — B349 Viral infection, unspecified: Secondary | ICD-10-CM

## 2016-06-05 DIAGNOSIS — Z794 Long term (current) use of insulin: Secondary | ICD-10-CM

## 2016-06-05 LAB — CBC WITH DIFFERENTIAL/PLATELET
BASOS ABS: 0 {cells}/uL (ref 0–200)
BASOS PCT: 0 %
Eosinophils Absolute: 230 cells/uL (ref 15–500)
Eosinophils Relative: 2 %
HEMATOCRIT: 27.1 % — AB (ref 38.5–50.0)
Hemoglobin: 8.5 g/dL — CL (ref 13.0–17.0)
Lymphocytes Relative: 23 %
Lymphs Abs: 2645 cells/uL (ref 850–3900)
MCH: 26.6 pg — AB (ref 27.0–33.0)
MCHC: 31.4 g/dL — AB (ref 32.0–36.0)
MCV: 85 fL (ref 80.0–100.0)
MONO ABS: 460 {cells}/uL (ref 200–950)
MONOS PCT: 4 %
MPV: 11.1 fL (ref 7.5–12.5)
NEUTROS PCT: 71 %
Neutro Abs: 8165 cells/uL — ABNORMAL HIGH (ref 1500–7800)
PLATELETS: 185 10*3/uL (ref 140–400)
RBC: 3.19 MIL/uL — ABNORMAL LOW (ref 4.20–5.80)
RDW: 15.6 % — AB (ref 11.0–15.0)
WBC: 11.5 10*3/uL — ABNORMAL HIGH (ref 3.8–10.8)

## 2016-06-05 LAB — COMPLETE METABOLIC PANEL WITH GFR
ALBUMIN: 3 g/dL — AB (ref 3.6–5.1)
ALK PHOS: 69 U/L (ref 40–115)
ALT: 15 U/L (ref 9–46)
AST: 13 U/L (ref 10–35)
BILIRUBIN TOTAL: 0.4 mg/dL (ref 0.2–1.2)
BUN: 62 mg/dL — ABNORMAL HIGH (ref 7–25)
CO2: 18 mmol/L — ABNORMAL LOW (ref 20–31)
Calcium: 8.4 mg/dL — ABNORMAL LOW (ref 8.6–10.3)
Chloride: 103 mmol/L (ref 98–110)
Creat: 2.91 mg/dL — ABNORMAL HIGH (ref 0.70–1.25)
GFR, EST AFRICAN AMERICAN: 26 mL/min — AB (ref >=60)
GFR, EST NON AFRICAN AMERICAN: 22 mL/min — AB (ref >=60)
Glucose, Bld: 158 mg/dL — ABNORMAL HIGH (ref 70–99)
POTASSIUM: 4.5 mmol/L (ref 3.5–5.3)
Sodium: 137 mmol/L (ref 135–146)
TOTAL PROTEIN: 5.9 g/dL — AB (ref 6.1–8.1)

## 2016-06-05 LAB — HEMOGLOBIN A1C
Hgb A1c MFr Bld: 5.9 % — ABNORMAL HIGH (ref <5.7)
MEAN PLASMA GLUCOSE: 123 mg/dL

## 2016-06-05 NOTE — Progress Notes (Signed)
Subjective:    Patient ID: Alexander Hubbard, male    DOB: 1956/02/28, 61 y.o.   MRN: [id number]  HPI  Patient recently had a cholecystectomy performed at [facility] [place] Postoperative course was complicated by an intra-abdominal abscess requiring percutaneous drainage. Drains were removed a proximally 10 days ago according to the patient. He been doing very well up until yesterday. He developed a low-grade fever chills, dizziness, and fatigue. Otherwise he is asymptomatic. He is feeling somewhat better this morning. His renal function was recently slightly elevated. Baseline is 2.1. Yesterday apparently at [facility] it was 2.8 Past Medical History:  Diagnosis Date  . AKI (acute kidney injury) (HCC) 07/2015  . CHF (congestive heart failure) (HCC)   . Diabetes mellitus   . Glaucoma (increased eye pressure)   . Gout attack 07/2015   RIGHT HAND LEFT SHOULDER RIGHT KNEE DOWN   . Gouty arthritis   . Hypertension   . Neuropathy of both feet   . Obesity   . Sleep apnea   . UTI (urinary tract infection) 07/2015  . Varicose veins    Past Surgical History:  Procedure Laterality Date  . CHOLECYSTECTOMY, LAPAROSCOPIC     05/2016 at [facility] complicated by post op abdominal abscess-E Faecium  . EYE SURGERY    . GASTRIC BYPASS    . KNEE SURGERY     Current Outpatient Prescriptions on File Prior to Visit  Medication Sig Dispense Refill  . amLODipine (NORVASC) 5 MG tablet Take 1 tablet (5 mg total) by mouth daily. 30 tablet 3  . aspirin 81 MG tablet Take 81 mg by mouth daily.    . brinzolamide (AZOPT) 1 % ophthalmic suspension Place 1 drop into the left eye 2 (two) times daily.     . Calcium Citrate-Vitamin D (CALCIUM + D PO) Take 1 tablet by mouth daily.     . carvedilol (COREG) 3.125 MG tablet Take 3.125 mg by mouth daily.     . colchicine 0.6 MG tablet Take 0.6 mg by mouth daily.    Marland Kitchen docusate sodium (COLACE) 100 MG capsule Take 100 mg by mouth 2 (two) times daily.    . ferrous sulfate 325 (65 FE) MG  tablet Take 325 mg by mouth daily with breakfast.    . gabapentin (NEURONTIN) 300 MG capsule TAKE 2 CAPSULES BY MOUTH 3 TIMES A DAY 180 capsule 3  . glucose blood (ACCU-CHEK SMARTVIEW) test strip 1 each by Other route See admin instructions. Check blood sugar 3 times daily.    Marland Kitchen latanoprost (XALATAN) 0.005 % ophthalmic solution Place 1 drop into the left eye at bedtime.     Marland Kitchen linaclotide (LINZESS) 145 MCG CAPS capsule Take 145 mcg by mouth daily before breakfast.    . Multiple Vitamins-Minerals (MULTIVITAMIN & MINERAL PO) Take 1 tablet by mouth 2 (two) times daily.     . pantoprazole (PROTONIX) 40 MG tablet TAKE 1 TABLET TWICE A DAY 60 tablet 8  . pentoxifylline (TRENTAL) 400 MG CR tablet Take 400 mg by mouth 3 (three) times daily with meals.    . rosuvastatin (CRESTOR) 20 MG tablet TAKE 1 TABLET BY MOUTH DAILY 30 tablet 3  . sodium bicarbonate 650 MG tablet Take 650 mg by mouth 3 (three) times daily.     . timolol (TIMOPTIC) 0.5 % ophthalmic solution Place 1 drop into both eyes 2 (two) times daily.    . TRADJENTA 5 MG TABS tablet Take 5 mg by mouth daily.  11  . ULORIC 40  MG tablet Take 1 tablet (40 mg total) by mouth daily. 30 tablet 5  . UNABLE TO FIND Dispense one pair of Diabetic Shoes. ICD E11.49, G62.9, B35.1, R60.9 1 each 0  . valsartan (DIOVAN) 40 MG tablet Take 40 mg by mouth daily.    . vitamin B-12 (CYANOCOBALAMIN) 1000 MCG tablet Take 1,000 mcg by mouth. (Chewable tablet)     No current facility-administered medications on file prior to visit.    Allergies  Allergen Reactions  . Penicillins Swelling and Rash  . Amoxicillin Swelling and Rash  . Niacin Itching and Other (See Comments)    Other reaction(s): Flushing (ALLERGY/intolerance)  . Penicillin G Rash   Social History   Social History  . Marital status: Married    Spouse name: N/A  . Number of children: N/A  . Years of education: N/A   Occupational History  . Not on file.   Social History Main Topics  .  Smoking status: Never Smoker  . Smokeless tobacco: Never Used  . Alcohol use No  . Drug use: No  . Sexual activity: Not on file   Other Topics Concern  . Not on file   Social History Narrative  . No narrative on file     Review of Systems  All other systems reviewed and are negative.      Objective:   Physical Exam  Constitutional: He appears well-developed and well-nourished.  HENT:  Right Ear: External ear normal.  Left Ear: External ear normal.  Nose: Nose normal.  Mouth/Throat: Oropharynx is clear and moist. No oropharyngeal exudate.  Eyes: Conjunctivae are normal.  Neck: Neck supple.  Cardiovascular: Normal rate, regular rhythm and normal heart sounds.   No murmur heard. Pulmonary/Chest: Effort normal and breath sounds normal. No respiratory distress. He has no wheezes. He has no rales.  Abdominal: Soft. Bowel sounds are normal. He exhibits no distension. There is no tenderness. There is no rebound and no guarding.  Lymphadenopathy:    He has no cervical adenopathy.  Skin: No rash noted.  Vitals reviewed.         Assessment & Plan:  Diabetes mellitus, type II, insulin dependent (HCC) - Plan: CBC with Differential/Platelet, COMPLETE METABOLIC PANEL WITH GFR, Hemoglobin A1c  Viral syndrome  Patient symptoms are consistent with a viral syndrome. He denies any cough or shortness of breath chest pain nausea vomiting diarrhea rash or dysuria. I will check a CMP to monitor his liver function test. Should there be significant elevations in his liver function tests or significantly elevated white blood cell count, the patient would need repeat imaging of the abdomen to ensure that there is not a reaccumulation of the intra-abdominal abscess. If her white blood cell count is normal and his lab tests are reassuring, I would recommend tincture of time and clinical monitoring. While drawing lab work I will check an A1c so that his endocrinologist will have that when he sees  him next week.

## 2016-06-06 ENCOUNTER — Ambulatory Visit: Payer: Medicaid Other | Attending: Family Medicine | Admitting: Physical Therapy

## 2016-06-06 ENCOUNTER — Other Ambulatory Visit: Payer: Self-pay | Admitting: *Deleted

## 2016-06-06 DIAGNOSIS — M6281 Muscle weakness (generalized): Secondary | ICD-10-CM | POA: Insufficient documentation

## 2016-06-06 DIAGNOSIS — R2689 Other abnormalities of gait and mobility: Secondary | ICD-10-CM | POA: Diagnosis present

## 2016-06-06 DIAGNOSIS — R262 Difficulty in walking, not elsewhere classified: Secondary | ICD-10-CM | POA: Diagnosis present

## 2016-06-06 DIAGNOSIS — D649 Anemia, unspecified: Secondary | ICD-10-CM

## 2016-06-06 NOTE — Therapy (Signed)
[facility] [address] [place], [place], [address] Phone: [phone]   Fax:  [phone]  Physical Therapy Evaluation  Patient Details  Name: Alexander Hubbard MRN: [id number] Date of Birth: 1956/01/01 Referring Provider: Rosana Fret  Encounter Date: 06/06/2016      PT End of Session - 06/06/16 1225    Visit Number 1   Number of Visits 4   Authorization Type Medicaid-Authorization submitted following eval-awaiting auth   PT Start Time 1020   PT Stop Time 1108   PT Time Calculation (min) 48 min      Past Medical History:  Diagnosis Date  . AKI (acute kidney injury) (HCC) 07/2015  . CHF (congestive heart failure) (HCC)   . Diabetes mellitus   . Glaucoma (increased eye pressure)   . Gout attack 07/2015   RIGHT HAND LEFT SHOULDER RIGHT KNEE DOWN   . Gouty arthritis   . Hypertension   . Neuropathy of both feet   . Obesity   . Sleep apnea   . UTI (urinary tract infection) 07/2015  . Varicose veins     Past Surgical History:  Procedure Laterality Date  . CHOLECYSTECTOMY, LAPAROSCOPIC     05/2016 at [facility] complicated by post op abdominal abscess-E Faecium  . EYE SURGERY    . GASTRIC BYPASS    . KNEE SURGERY      Vitals:   06/06/16 1058 06/06/16 1101  BP: 122/62 (!) 104/55  Pulse: 69 74  SpO2: 98%          Subjective Assessment - 06/06/16 1123    Subjective Pt reports having gall bladder surgery in November 2017; he returned to the hospital due to abscess. (discharge 05/16/16) He feels his balance is worse and he isn't able to get up and down steps.  Pt has been using cane prior to surgery, and he continues to use cane.  Pt has had one fall in the past 6 months, which ocurred getting up from a chair prior to surgery.  Pt prefers to be called "[name]"   Patient is accompained by: Family member  wife   Limitations Standing   How long can you stand comfortably? Limited standing 1-2 minutes   How long can  you walk comfortably? Limited 5 minutes   Patient Stated Goals Pt's goal for therapy  improve my balance and get up on steps.   Currently in Pain? No/denies            [facility] PT Assessment - 06/06/16 1039      Assessment   Medical Diagnosis deconditioning s/p gall bladder surgery   Referring Provider Rosana Fret   Onset Date/Surgical Date 04/17/16  surgery     Precautions   Precautions Fall  Acid reflux instructions-per MD 06/04/16   Precaution Comments monitor for orthostasis     Balance Screen   Has the patient fallen in the past 6 months Yes   How many times? 1   Has the patient had a decrease in activity level because of a fear of falling?  Yes   Is the patient reluctant to leave their home because of a fear of falling?  Yes     Home Environment   Living Environment Private residence   Living Arrangements Spouse/significant other   Available Help at Discharge Family   Type of Home House   Home Access Stairs to enter   Entrance Stairs-Number of Steps 5   Entrance Stairs-Rails Right;Left;Cannot reach both   Home  Layout One level   Home [employer] - single point;Walker - 4 wheels     Prior Function   Level of Independence Needs assistance with ADLs;Needs assistance with gait  Improving with ADLs, wife assists with dressing   Vocation On disability   Leisure Has stationary bike at home. Prior to illness could do for >60 minutes   Comments Sitting to put on pants with dressing; uses cane at time in home, other times walks without cane with wife's assistance     Cognition   Overall Cognitive Status Within Functional Limits for tasks assessed     Observation/Other Assessments   Focus on Therapeutic Outcomes (FOTO)  Functional Status Intake score 46; ABC scale score 19.4%     Sensation   Light Touch Impaired by gross assessment  bil foot neuropathy     ROM / Strength   AROM / PROM / Strength Strength;AROM     AROM   Overall AROM Comments ankle dorsiflexion  limited bilaterally, not able to achieve neutral dorsiflexion bilaterally A/ROM     Strength   Overall Strength Deficits   Strength Assessment Site Hip;Knee;Ankle   Right/Left Hip Right;Left   Right Hip Flexion 4/5   Left Hip Flexion 4/5   Right/Left Knee Right;Left   Right Knee Flexion 4/5   Right Knee Extension 4/5   Left Knee Flexion 4/5   Left Knee Extension 4/5   Right/Left Ankle Right;Left   Right Ankle Dorsiflexion 3-/5   Left Ankle Dorsiflexion 3-/5     Transfers   Transfers Sit to Stand;Stand to Sit   Sit to Stand 5: Supervision;With upper extremity assist;With armrests  up to cane   Stand to Sit 5: Supervision;With upper extremity assist;With armrests   Stand to Sit Details supervision for safety due to reports of dizziness and h/o fall     Ambulation/Gait   Ambulation/Gait Yes   Ambulation/Gait Assistance 5: Supervision   Ambulation Distance (Feet) 100 Feet  seated rest; 75; seated rest   Assistive device Straight cane   Gait Pattern Step-through pattern;Decreased arm swing - right;Decreased arm swing - left;Decreased stride length;Shuffle;Wide base of support   Ambulation Surface Level;Indoor   Gait velocity 12.98 sec / 2.53 ft/sec     Standardized Balance Assessment   Standardized Balance Assessment Timed Up and Go Test;Berg Balance Test     Berg Balance Test   Sit to Stand Able to stand  independently using hands   Standing Unsupported Able to stand 30 seconds unsupported   Stand to Sit Controls descent by using hands   Berg comment: Unable to complete testing due to dizziness; BP assessed sit and stand with drop (SBP by 18; DBP by 7; see vitals)     Timed Up and Go Test   Normal TUG (seconds) 15.22   TUG Comments Scores >13.5 seconds indicate increased fall risk.                                PT Long Term Goals - 06/06/16 1250      PT LONG TERM GOAL #1   Title Pt will be independent with HEP for strengthening, balance, and  gait.     Baseline No current HEP   Time 3   Period Weeks   Status New     PT LONG TERM GOAL #2   Title Pt will improve TUG score to less than or equal to 13.5 seconds for  decreased fall risk.   Baseline TUG 15.22 sec; Scores >13.5 sec indicate increased fall risk.   Time 3   Period Weeks   Status New     PT LONG TERM GOAL #3   Title Berg Balance score to be completed, with pt to improve score by at least 7 points for decreased fall risk.   Baseline Initiated Berg, unable to complete due to BP issues.  Pt has had one fall in past 6 months.   Time 3   Period Weeks   Status New     PT LONG TERM GOAL #4   Title Pt will improve ABC score by 20% for improved balance confidence.   Baseline ABC score 19.4%   Time 3   Period Weeks   Status New     PT LONG TERM GOAL #5   Title Pt/family will verbalize understanding of fall prevention in the home environment.   Baseline Pt at fall risk per TUG, one fall in past 6 months   Time 3   Period Weeks   Status New               Plan - 06/06/16 1142    Clinical Impression Statement Patient presents with deconditioning and muscle weakness, decreased balance, and gait instability following cholecystectomy with complications and prolonged hospitalization. Pt was hospitalized 04/16/16-04/22/16 for cholecystecomy on 04/17/16.  Pt was readmitted/hospitalized from 05/06/16 and discharged on 05/15/16 due to Acute renal failure with acute tubular necrosis superimposed on stage 3 chronic kidney disease, morbid obesity, and diabetes mellitus with peripheral neuropathy (E11.40); Patient has experienced one fall since hospital discharge and has limited his activity  (even leaving his home) due to his fear of falling. Activity tolerance further complicated by drop in blood pressure with standing (+dizziness). Patient plans to follow-up with his physician regarding his blood pressure. Anticipate patient will benefit from PT to increase his safety with  mobility (decrease his fall risk) which will allow him to resume activities outside his home.   Rehab Potential Good   Clinical Impairments Affecting Rehab Potential BP dropping with positional changes, ongoing medical complexity (MD is aware)   PT Frequency 1x / week  PT recommends 2x/wk for 6-8 wks, but limited due to insurance   PT Duration 3 weeks  Over 8-12 week period   PT Treatment/Interventions DME Instruction;Gait training;Stair training;Functional mobility training;Therapeutic activities;Therapeutic exercise;Balance training;Neuromuscular re-education;Patient/family education   PT Next Visit Plan monitor for ?orthostasis; complete Berg; assess stairs; assess PF; exercises for strengthening legs (initiate HEP)   Consulted and Agree with Plan of Care Patient;Family member/caregiver   Family Member Consulted wife      Patient will benefit from skilled therapeutic intervention in order to improve the following deficits and impairments:  Abnormal gait, Decreased activity tolerance, Decreased balance, Decreased strength, Decreased knowledge of use of DME, Decreased mobility, Decreased range of motion, Difficulty walking  Visit Diagnosis: Difficulty in walking, not elsewhere classified  Other abnormalities of gait and mobility  Muscle weakness (generalized)     Problem List Patient Active Problem List   Diagnosis Date Noted  . Critical lower limb ischemia 01/18/2016  . Acute gout   . CKD (chronic kidney disease) stage 3, GFR 30-59 ml/min 07/18/2015  . Acute renal failure (HCC) 07/18/2015  . AKI (acute kidney injury) (HCC) 07/17/2015  . Diabetes mellitus with complication (HCC) 07/17/2015  . Gout attack 07/17/2015  . PVD (peripheral vascular disease) (HCC) 07/17/2015  . Abnormal urine 07/17/2015  . Protruded cervical  disc 09/21/2014  . Neck pain 09/21/2014  . Headache 08/03/2014  . Accidental drug overdose 09/19/2013  . Syncope 08/04/2013  . Acute bronchitis 06/22/2013   . Swelling of limb 05/13/2013  . Pain in limb 05/13/2013  . Varicose veins of lower extremities with other complications 05/13/2013  . Chronic venous insufficiency 05/13/2013  . Chronic kidney disease, stage III (moderate) 05/12/2013  . Hypoxemia 03/31/2013  . Chronic kidney disease, stage IV (severe) (HCC) 03/30/2013  . SIRS (systemic inflammatory response syndrome) (HCC) 03/30/2013  . Diarrhea 03/30/2013  . DM2 (diabetes mellitus, type 2) (HCC) 01/05/2013  . Chest pain 09/22/2012  . IDDM (insulin dependent diabetes mellitus) (HCC)   . Glaucoma   . OSA (obstructive sleep apnea)   . Gout   . Diastolic CHF, chronic (HCC)   . Essential hypertension, benign   . Hyperlipidemia   . Peripheral neuropathy (HCC)   . Morbid obesity (HCC)   . Renal failure, chronic   . Peripheral edema     MARRIOTT,AMY W. 06/06/2016, 12:56 PM  Gean MaidensMARRIOTT,AMY W., PT  [facility] [address] [place], [place], [address] Phone: [phone]   Fax:  [phone]  Name: Virgel BouquetRiyad Donigan MRN: [id number] Date of Birth: May 08, 1956

## 2016-06-07 LAB — IRON AND TIBC
%SAT: 11 % — ABNORMAL LOW (ref 15–60)
Iron: 27 ug/dL — ABNORMAL LOW (ref 50–180)
TIBC: 256 ug/dL (ref 250–425)
UIBC: 229 ug/dL (ref 125–400)

## 2016-06-07 LAB — VITAMIN B12: VITAMIN B 12: 991 pg/mL (ref 200–1100)

## 2016-06-07 LAB — FERRITIN: FERRITIN: 150 ng/mL (ref 20–380)

## 2016-06-07 LAB — FOLATE: FOLATE: 11 ng/mL (ref >5.4)

## 2016-06-10 ENCOUNTER — Encounter: Payer: Self-pay | Admitting: "Endocrinology

## 2016-06-10 ENCOUNTER — Ambulatory Visit (INDEPENDENT_AMBULATORY_CARE_PROVIDER_SITE_OTHER): Payer: Medicaid Other | Admitting: "Endocrinology

## 2016-06-10 VITALS — BP 153/81 | HR 65 | Ht 68.0 in | Wt 249.0 lb

## 2016-06-10 DIAGNOSIS — I10 Essential (primary) hypertension: Secondary | ICD-10-CM | POA: Diagnosis not present

## 2016-06-10 DIAGNOSIS — E782 Mixed hyperlipidemia: Secondary | ICD-10-CM

## 2016-06-10 DIAGNOSIS — E118 Type 2 diabetes mellitus with unspecified complications: Secondary | ICD-10-CM

## 2016-06-10 NOTE — Progress Notes (Signed)
Subjective:    Patient ID: Alexander Hubbard, male    DOB: 17-Jan-1956. Patient is being seen in f/u  for management of diabetes requested by  Leo Grosser, MD  Past Medical History:  Diagnosis Date  . AKI (acute kidney injury) (HCC) 07/2015  . CHF (congestive heart failure) (HCC)   . Diabetes mellitus   . Glaucoma (increased eye pressure)   . Gout attack 07/2015   RIGHT HAND LEFT SHOULDER RIGHT KNEE DOWN   . Gouty arthritis   . Hypertension   . Neuropathy of both feet   . Obesity   . Sleep apnea   . UTI (urinary tract infection) 07/2015  . Varicose veins    Past Surgical History:  Procedure Laterality Date  . CHOLECYSTECTOMY, LAPAROSCOPIC     05/2016 at [facility] complicated by post op abdominal abscess-E Faecium  . EYE SURGERY    . GASTRIC BYPASS    . KNEE SURGERY     Social History   Social History  . Marital status: Married    Spouse name: N/A  . Number of children: N/A  . Years of education: N/A   Social History Main Topics  . Smoking status: Never Smoker  . Smokeless tobacco: Never Used  . Alcohol use No  . Drug use: No  . Sexual activity: Not Asked   Other Topics Concern  . None   Social History Narrative  . None   Outpatient Encounter Prescriptions as of 06/10/2016  Medication Sig  . aspirin 81 MG tablet Take 81 mg by mouth daily.  . brinzolamide (AZOPT) 1 % ophthalmic suspension Place 1 drop into the left eye 2 (two) times daily.   . Calcium Citrate-Vitamin D (CALCIUM + D PO) Take 1 tablet by mouth 2 (two) times daily.   . carvedilol (COREG) 3.125 MG tablet Take 3.125 mg by mouth daily.   . colchicine 0.6 MG tablet Take 0.6 mg by mouth daily.  Marland Kitchen docusate sodium (COLACE) 100 MG capsule Take 100 mg by mouth 2 (two) times daily.  . ferrous sulfate 325 (65 FE) MG tablet Take 325 mg by mouth daily with breakfast.  . furosemide (LASIX) 40 MG tablet Take 40 mg by mouth daily.   Marland Kitchen gabapentin (NEURONTIN) 300 MG capsule TAKE 2 CAPSULES BY MOUTH 3 TIMES A DAY   . latanoprost (XALATAN) 0.005 % ophthalmic solution Place 1 drop into the left eye at bedtime.   Marland Kitchen linaclotide (LINZESS) 145 MCG CAPS capsule Take 145 mcg by mouth daily before breakfast.  . Multiple Vitamins-Minerals (MULTIVITAMIN & MINERAL PO) Take 1 tablet by mouth 2 (two) times daily.   . pantoprazole (PROTONIX) 40 MG tablet TAKE 1 TABLET TWICE A DAY  . pentoxifylline (TRENTAL) 400 MG CR tablet Take 400 mg by mouth 3 (three) times daily with meals.  . rosuvastatin (CRESTOR) 20 MG tablet TAKE 1 TABLET BY MOUTH DAILY  . sodium bicarbonate 650 MG tablet Take 650 mg by mouth 3 (three) times daily.   . timolol (TIMOPTIC) 0.5 % ophthalmic solution Place 1 drop into both eyes 2 (two) times daily.  Marland Kitchen ULORIC 40 MG tablet Take 1 tablet (40 mg total) by mouth daily.  . valsartan (DIOVAN) 40 MG tablet Take 40 mg by mouth daily.  . vitamin B-12 (CYANOCOBALAMIN) 1000 MCG tablet Take 1,000 mcg by mouth. (Chewable tablet)  . [DISCONTINUED] valsartan (DIOVAN) 40 MG tablet Take 40 mg by mouth daily.  Marland Kitchen glucose blood (ACCU-CHEK SMARTVIEW) test strip 1 each by Other  route See admin instructions. Check blood sugar 3 times daily.  Marland Kitchen UNABLE TO FIND Dispense one pair of Diabetic Shoes. ICD E11.49, G62.9, B35.1, R60.9  . [DISCONTINUED] amLODipine (NORVASC) 5 MG tablet Take 1 tablet (5 mg total) by mouth daily.  . [DISCONTINUED] TRADJENTA 5 MG TABS tablet Take 5 mg by mouth daily.   No facility-administered encounter medications on file as of 06/10/2016.    ALLERGIES: Allergies  Allergen Reactions  . Penicillins Swelling and Rash  . Amoxicillin Swelling and Rash  . Niacin Itching and Other (See Comments)    Other reaction(s): Flushing (ALLERGY/intolerance)  . Penicillin G Rash   VACCINATION STATUS: Immunization History  Administered Date(s) Administered  . Influenza Whole 03/07/2011  . Influenza,inj,Quad PF,36+ Mos 02/17/2013, 02/15/2014, 02/12/2016  . Influenza-Unspecified 03/16/2015  . Pneumococcal  Conjugate-13 04/07/2013  . Pneumococcal Polysaccharide-23 01/09/2015    Diabetes  He presents for his initial diabetic visit. He has type 2 diabetes mellitus. Onset time: He was diagnosed at approximate age of 30 years. His disease course has been improving (He underwent bariatric surgery in March 2015. Since then he has A1c under 7%., The last one from July 2017 was 5.8%.). Hypoglycemia symptoms include dizziness. Pertinent negatives for hypoglycemia include no confusion, headaches, pallor or seizures. There are no diabetic associated symptoms. Pertinent negatives for diabetes include no chest pain, no fatigue, no polydipsia, no polyphagia, no polyuria and no weakness. There are no hypoglycemic complications. Symptoms are stable. Diabetic complications include heart disease, impotence, nephropathy and PVD. Risk factors for coronary artery disease include diabetes mellitus, dyslipidemia, hypertension, male sex, obesity and sedentary lifestyle. Current diabetic treatment includes oral agent (monotherapy). His weight is stable (He weighed about 400 pounds before his surgery in 2015. He weighs 263 pounds today.). He is following a generally unhealthy diet. When asked about meal planning, he reported none. He has had a previous visit with a dietitian. He participates in exercise intermittently. His overall blood glucose range is 110-130 mg/dl. An ACE inhibitor/angiotensin II receptor blocker is being taken. He sees a podiatrist.Eye exam is current.  Hyperlipidemia  This is a chronic problem. The current episode started more than 1 year ago. Pertinent negatives include no chest pain, myalgias or shortness of breath. Current antihyperlipidemic treatment includes statins. Risk factors for coronary artery disease include dyslipidemia, diabetes mellitus, hypertension, male sex and obesity.  Hypertension  This is a chronic problem. The current episode started more than 1 year ago. The problem is controlled. Pertinent  negatives include no chest pain, headaches, neck pain, palpitations or shortness of breath. Past treatments include ACE inhibitors. Hypertensive end-organ damage includes kidney disease, CAD/MI and PVD.      Review of Systems  Constitutional: Negative for chills, fatigue, fever and unexpected weight change.  HENT: Negative for dental problem, mouth sores and trouble swallowing.   Eyes: Negative for visual disturbance.  Respiratory: Negative for cough, choking, chest tightness, shortness of breath and wheezing.   Cardiovascular: Negative for chest pain, palpitations and leg swelling.  Gastrointestinal: Negative for abdominal distention, abdominal pain, constipation, diarrhea, nausea and vomiting.  Endocrine: Negative for polydipsia, polyphagia and polyuria.  Genitourinary: Positive for impotence. Negative for dysuria, flank pain, hematuria and urgency.  Musculoskeletal: Negative for back pain, gait problem, myalgias and neck pain.  Skin: Negative for pallor, rash and wound.  Neurological: Positive for dizziness. Negative for seizures, syncope, weakness, numbness and headaches.  Psychiatric/Behavioral: Negative.  Negative for confusion and dysphoric mood.    Objective:    BP Marland Kitchen)  153/81   Pulse 65   Ht 5\' 8"  (1.727 m)   Wt 249 lb (112.9 kg)   BMI 37.86 kg/m   Wt Readings from Last 3 Encounters:  06/10/16 249 lb (112.9 kg)  06/05/16 254 lb (115.2 kg)  04/04/16 265 lb (120.2 kg)    Physical Exam  Constitutional: He is oriented to person, place, and time. He appears well-developed. He is cooperative. No distress.  Obese .  HENT:  Head: Normocephalic and atraumatic.  Edentulous, poor dentition.  Eyes: EOM are normal.  Neck: Normal range of motion. Neck supple. No tracheal deviation present. No thyromegaly present.  Cardiovascular: Normal rate, S1 normal, S2 normal and normal heart sounds.  Exam reveals no gallop.   No murmur heard. Pulses:      Dorsalis pedis pulses are 1+ on the  right side, and 1+ on the left side.       Posterior tibial pulses are 1+ on the right side, and 1+ on the left side.  Pulmonary/Chest: Breath sounds normal. No respiratory distress. He has no wheezes.  Abdominal: Soft. Bowel sounds are normal. He exhibits no distension. There is no tenderness. There is no guarding and no CVA tenderness.  Musculoskeletal: He exhibits no edema.       Right shoulder: He exhibits no swelling and no deformity.  Neurological: He is alert and oriented to person, place, and time. He has normal strength and normal reflexes. No cranial nerve deficit or sensory deficit. Gait normal.  Skin: Skin is warm and dry. No rash noted. No cyanosis. Nails show no clubbing.  Psychiatric: He has a normal mood and affect. His speech is normal and behavior is normal. Judgment and thought content normal. Cognition and memory are normal.     CMP     Component Value Date/Time   NA 137 06/05/2016 0955   K 4.5 06/05/2016 0955   CL 103 06/05/2016 0955   CO2 18 (L) 06/05/2016 0955   GLUCOSE 158 (H) 06/05/2016 0955   BUN 62 (H) 06/05/2016 0955   CREATININE 2.91 (H) 06/05/2016 0955   CALCIUM 8.4 (L) 06/05/2016 0955   PROT 5.9 (L) 06/05/2016 0955   ALBUMIN 3.0 (L) 06/05/2016 0955   AST 13 06/05/2016 0955   ALT 15 06/05/2016 0955   ALKPHOS 69 06/05/2016 0955   BILITOT 0.4 06/05/2016 0955   GFRNONAA 22 (L) 06/05/2016 0955   GFRAA 26 (L) 06/05/2016 0955     Diabetic Labs (most recent): Lab Results  Component Value Date   HGBA1C 5.9 (H) 06/05/2016   HGBA1C 5.8 (H) 12/18/2015   HGBA1C 6.5 (H) 07/17/2015     Lipid Panel ( most recent) Lipid Panel     Component Value Date/Time   CHOL 91 (L) 12/18/2015 0947   TRIG 56 12/18/2015 0947   HDL 40 12/18/2015 0947   CHOLHDL 2.3 12/18/2015 0947   VLDL 11 12/18/2015 0947   LDLCALC 40 12/18/2015 0947   LDLDIRECT 119.0 07/03/2014 1035       Assessment & Plan:   1. Type 2 diabetes mellitus with stage 3 chronic kidney disease,  without long-term current use of insulin (HCC)  - Patient has currently controlled asymptomatic type 2 DM since  61 years of age. - His A1c has improved to 5.9%. He is currently not on any medications. - Patient's history is such that he had prior course of severely uncontrolled diabetes with A1c as high as 11% requiring various forms of insulin including U500. After he underwent bariatric  surgery in March 2015 . In all, he has lost 178 pounds so far. - His diabetes is complicated by coronary artery disease/CHF, peripheral arterial disease, nephropathy, neuropathy, and patient remains at a high risk for more acute and chronic complications of diabetes which include CAD, CVA, CKD, retinopathy, and neuropathy. These are all discussed in detail with the patient.  - I have counseled the patient on diet management and weight loss, by adopting a carbohydrate restricted/protein rich diet.  - Suggestion is made for patient to avoid simple carbohydrates   from their diet including Cakes , Desserts, Ice Cream,  Soda (  diet and regular) , Sweet Tea , Candies,  Chips, Cookies, Artificial Sweeteners,   and "Sugar-free" Products . This will help patient to have stable blood glucose profile and potentially avoid unintended weight gain.  - I encouraged the patient to switch to  unprocessed or minimally processed complex starch and increased protein intake (animal or plant source), fruits, and vegetables.  - Patient is advised to stick to a routine mealtimes to eat 3 meals  a day and avoid unnecessary snacks ( to snack only to correct hypoglycemia).  - The patient will be scheduled with Norm Salt, RDN, CDE for individualized DM education.  - I have approached patient with the following individualized plan to manage diabetes and patient agrees:   - Clearly, he has benefited from bariatric surgery. He was able to stop most of his medications for diabetes and maintained good control of diabetes with A1c of 5.9%,  off of medications since last visit.   -I will continue without medications until next visit in 4 months. -Patient is encouraged to call clinic for blood glucose levels less than 70 or above 200 mg /dl. - He is not a candidate for metformin nor SGLT 2 inhibitors due to CKD.  - Patient specific target  A1c;  LDL, HDL, Triglycerides, and  Waist Circumference were discussed in detail.  2) BP/HTN: uncontrolled. Continue current medications. He is not on ACE inhibitor/ARB. 3) Lipids/HPL:  uncontrolled, continue statins. 4)  Weight/Diet: He has CDE care in progress. I advised him to continue  , exercise, and detailed carbohydrates information provided.  5) Chronic Care/Health Maintenance:  -Patient is on Statin medications and encouraged to continue to follow up with Ophthalmology, Podiatrist at least yearly or according to recommendations, and advised to   stay away from smoking. I have recommended yearly flu vaccine and pneumonia vaccination at least every 5 years; moderate intensity exercise for up to 150 minutes weekly; and  sleep for at least 7 hours a day.  - 30 minutes of time was spent on the care of this patient , 50% of which was applied for counseling on diabetes complications and their preventions.  - Patient to bring meter and  blood glucose logs during their next visit.   - I advised patient to maintain close follow up with [facility] TOM, MD for primary care needs.  Follow up plan: - Return in about 4 months (around 10/08/2016) for follow up with pre-visit labs.  Marquis Lunch, MD Phone: [phone]  Fax: [phone]   06/10/2016, 3:25 PM

## 2016-06-16 ENCOUNTER — Encounter: Payer: Self-pay | Admitting: Podiatry

## 2016-06-16 ENCOUNTER — Ambulatory Visit (INDEPENDENT_AMBULATORY_CARE_PROVIDER_SITE_OTHER): Payer: Medicaid Other | Admitting: Podiatry

## 2016-06-16 DIAGNOSIS — E1149 Type 2 diabetes mellitus with other diabetic neurological complication: Secondary | ICD-10-CM

## 2016-06-16 DIAGNOSIS — B351 Tinea unguium: Secondary | ICD-10-CM | POA: Diagnosis not present

## 2016-06-16 DIAGNOSIS — G6289 Other specified polyneuropathies: Secondary | ICD-10-CM

## 2016-06-16 DIAGNOSIS — M79676 Pain in unspecified toe(s): Secondary | ICD-10-CM | POA: Diagnosis not present

## 2016-06-16 DIAGNOSIS — I739 Peripheral vascular disease, unspecified: Secondary | ICD-10-CM

## 2016-06-16 DIAGNOSIS — M2042 Other hammer toe(s) (acquired), left foot: Secondary | ICD-10-CM

## 2016-06-16 DIAGNOSIS — M2041 Other hammer toe(s) (acquired), right foot: Secondary | ICD-10-CM

## 2016-06-16 NOTE — Progress Notes (Addendum)
Patient ID: Alexander BouquetRiyad Hubbard, male   DOB: [birth year], 61 y.o.   MRN: [id number]  Subjective: 61 y.o. returns the office today for painful, elongated, thickened toenails which he is unable to trim herself. Denies any redness or drainage around the nails. He is requesting diabetic shoes as [employer] is no longer doing them. Denies any drainage or pus or any swelling or redness. No other complaints today. Denies any systemic complaints such as fevers, chills, nausea, vomiting.   Objective: AAO 3, NAD DP/PT pulses palpable 1/4, CRT less than 3 seconds Protective sensation decreased with Simms Weinstein monofilament Nails hypertrophic, dystrophic, elongated, brittle, discolored 10. There is tenderness overlying the nails 1-5 bilaterally. There is no surrounding erythema or drainage along the nail sites. Prominent metatarsal heads b/l with atrophy of the fat pad Hammertoes are present.  No other open lesions or pre-ulcerative lesions are identified bilaterally.  No other areas of tenderness bilateral lower extremities. No overlying edema, erythema, increased warmth. No pain with calf compression, swelling, warmth, erythema.  Assessment: Patient presents with symptomatic onychomycosis; history of pre-ulcerative lesions, hammertoes  Plan: -Treatment options including alternatives, risks, complications were discussed -Nails sharply debrided 10 without complication/bleeding. -Paperwork was completed today for pre-certification.  -Follow-up in 3 months or sooner if any problems are to arise. In the meantime, encouraged to call the office with any questions, concerns, changes symptoms.  Ovid CurdMatthew Wagoner, DPM  *Due to insurance we are unable to do diabetic shoes. Will contact the patient and try to get him into [employer] for shoes/inserts.

## 2016-06-18 ENCOUNTER — Ambulatory Visit: Payer: Medicaid Other | Admitting: Physical Therapy

## 2016-06-20 ENCOUNTER — Ambulatory Visit: Payer: Medicaid Other | Admitting: Physical Therapy

## 2016-06-23 ENCOUNTER — Ambulatory Visit: Payer: Medicaid Other | Admitting: Physical Therapy

## 2016-06-23 VITALS — BP 134/64 | HR 61

## 2016-06-23 DIAGNOSIS — R2689 Other abnormalities of gait and mobility: Secondary | ICD-10-CM

## 2016-06-23 DIAGNOSIS — R262 Difficulty in walking, not elsewhere classified: Secondary | ICD-10-CM | POA: Diagnosis not present

## 2016-06-23 DIAGNOSIS — M6281 Muscle weakness (generalized): Secondary | ICD-10-CM

## 2016-06-23 NOTE — Therapy (Signed)
[facility] [address] [place], [place], [address] Phone: [phone]   Fax:  [phone]  Physical Therapy Treatment  Patient Details  Name: Alexander Hubbard MRN: [id number] Date of Birth: 21-Sep-1955 Referring Provider: Rosana Fret  Encounter Date: 06/23/2016      PT End of Session - 06/23/16 1359    Visit Number 2   Number of Visits 4   Authorization Type Medicaid-Authorization submitted following eval-awaiting auth   Authorization Time Period 3 visits approved, from 06/16/16-07/27/16   Authorization - Visit Number 1   Authorization - Number of Visits 3   PT Start Time 0934   PT Stop Time 1015   PT Time Calculation (min) 41 min   Activity Tolerance Patient tolerated treatment well   Behavior During Therapy [facility] for tasks assessed/performed      Past Medical History:  Diagnosis Date  . AKI (acute kidney injury) (HCC) 07/2015  . CHF (congestive heart failure) (HCC)   . Diabetes mellitus   . Glaucoma (increased eye pressure)   . Gout attack 07/2015   RIGHT HAND LEFT SHOULDER RIGHT KNEE DOWN   . Gouty arthritis   . Hypertension   . Neuropathy of both feet   . Obesity   . Sleep apnea   . UTI (urinary tract infection) 07/2015  . Varicose veins     Past Surgical History:  Procedure Laterality Date  . CHOLECYSTECTOMY, LAPAROSCOPIC     05/2016 at [facility] complicated by post op abdominal abscess-E Faecium  . EYE SURGERY    . GASTRIC BYPASS    . KNEE SURGERY      Vitals:   06/23/16 0950 06/23/16 0951  BP: (!) 149/69 134/64  Pulse: 61         Subjective Assessment - 06/23/16 1354    Subjective No changes, no difficulties since last visit.  Using stationary bike everyday for at least 20 minutes.  Pt prefers to be called "Alexander Hubbard"   Patient is accompained by: Family member  wife   Limitations Standing   How long can you stand comfortably? Limited standing 1-2 minutes   How long can you walk comfortably?  Limited 5 minutes   Patient Stated Goals Pt's goal for therapy  improve my balance and get up on steps.   Currently in Pain? No/denies            [facility] PT Assessment - 06/23/16 0954      Standardized Balance Assessment   Standardized Balance Assessment Berg Balance Test     Berg Balance Test   Sit to Stand Able to stand  independently using hands   Standing Unsupported Able to stand 30 seconds unsupported  1 minute   Sitting with Back Unsupported but Feet Supported on Floor or Stool Able to sit safely and securely 2 minutes   Stand to Sit Controls descent by using hands   Transfers Able to transfer with verbal cueing and /or supervision   Standing Unsupported with Eyes Closed Able to stand 10 seconds with supervision   Standing Ubsupported with Feet Together Able to place feet together independently but unable to hold for 30 seconds   From Standing, Reach Forward with Outstretched Arm Loses balance while trying/requires external support   From Standing Position, Pick up Object from Floor Unable to try/needs assist to keep balance   From Standing Position, Turn to Look Behind Over each Shoulder Needs supervision when turning   Turn 360 Degrees Able to turn 360 degrees safely  but slowly   Standing Unsupported, Alternately Place Feet on Step/Stool Needs assistance to keep from falling or unable to try   Standing Unsupported, One Foot in [employer] Loses balance while stepping or standing   Standing on One Leg Unable to try or needs assist to prevent fall   Total Score 22   Berg comment: Scores <45/56 indicate increased fall risk.    Discussed patient's Berg score in relation to fall risk and recommendations to use RW versus cane.  However, pt reports he does not feel comfortable using RW and plans to continue to use cane.                 [facility] Adult PT Treatment/Exercise - 06/23/16 0954      High Level Balance   High Level Balance Activities Side stepping   High Level  Balance Comments Marching in place x 10 reps,  alternating back kicks x 10 reps, then alternating side kicks x 10 reps with UE support, cues for upright posture.  Tandem stance x 10 seconds, then single limb stance x 10 seconds, 2 reps each, with 1 UE support at counter.     Exercises   Exercises Knee/Hip  seated heel/toe raises x 10 reps     Knee/Hip Exercises: Seated   Long Arc Quad AROM;Right;Left;10 reps   Knee/Hip Flexion x 20 reps                PT Education - 06/23/16 1358    Education provided Yes   Education Details HEP-see instructions   Person(s) Educated Patient;Spouse   Methods Explanation;Demonstration;Handout   Comprehension Verbalized understanding;Returned demonstration;Verbal cues required             PT Long Term Goals - 06/06/16 1250      PT LONG TERM GOAL #1   Title Pt will be independent with HEP for strengthening, balance, and gait.     Baseline No current HEP   Time 3   Period Weeks   Status New     PT LONG TERM GOAL #2   Title Pt will improve TUG score to less than or equal to 13.5 seconds for decreased fall risk.   Baseline TUG 15.22 sec; Scores >13.5 sec indicate increased fall risk.   Time 3   Period Weeks   Status New     PT LONG TERM GOAL #3   Title Berg Balance score to be completed, with pt to improve score by at least 7 points for decreased fall risk.   Baseline Initiated Berg, unable to complete due to BP issues.  Pt has had one fall in past 6 months.   Time 3   Period Weeks   Status New     PT LONG TERM GOAL #4   Title Pt will improve ABC score by 20% for improved balance confidence.   Baseline ABC score 19.4%   Time 3   Period Weeks   Status New     PT LONG TERM GOAL #5   Title Pt/family will verbalize understanding of fall prevention in the home environment.   Baseline Pt at fall risk per TUG, one fall in past 6 months   Time 3   Period Weeks   Status New               Plan - 06/23/16 1401     Clinical Impression Statement Treatment session focused today on lower extremity strengthening and balance activities.  Pt needs several seated rest breaks  during Conway Balance test due to fatigue, but otherwise, no c/o dizziness with standing activities.  Pt's MD was aware of BP issues and made medication changes (per patient report).  Pt will continue to benefit from skilled PT to address balance, strength and gait.     Rehab Potential Good   Clinical Impairments Affecting Rehab Potential BP dropping with positional changes, ongoing medical complexity (MD is aware)   PT Frequency 1x / week  PT recommends 2x/wk for 6-8 wks, but limited due to insurance   PT Duration 3 weeks  Over 8-12 week period   PT Treatment/Interventions DME Instruction;Gait training;Stair training;Functional mobility training;Therapeutic activities;Therapeutic exercise;Balance training;Neuromuscular re-education;Patient/family education   PT Next Visit Plan assess stairs; Review HEP and continue to address strengthening and balance; discuss walking program   Consulted and Agree with Plan of Care Patient;Family member/caregiver   Family Member Consulted wife      Patient will benefit from skilled therapeutic intervention in order to improve the following deficits and impairments:  Abnormal gait, Decreased activity tolerance, Decreased balance, Decreased strength, Decreased knowledge of use of DME, Decreased mobility, Decreased range of motion, Difficulty walking  Visit Diagnosis: Muscle weakness (generalized)  Other abnormalities of gait and mobility     Problem List Patient Active Problem List   Diagnosis Date Noted  . Critical lower limb ischemia 01/18/2016  . Acute gout   . CKD (chronic kidney disease) stage 3, GFR 30-59 ml/min 07/18/2015  . Acute renal failure (HCC) 07/18/2015  . AKI (acute kidney injury) (HCC) 07/17/2015  . Diabetes mellitus with complication (HCC) 07/17/2015  . Gout attack 07/17/2015  .  PVD (peripheral vascular disease) (HCC) 07/17/2015  . Abnormal urine 07/17/2015  . Protruded cervical disc 09/21/2014  . Neck pain 09/21/2014  . Headache 08/03/2014  . Accidental drug overdose 09/19/2013  . Syncope 08/04/2013  . Acute bronchitis 06/22/2013  . Swelling of limb 05/13/2013  . Pain in limb 05/13/2013  . Varicose veins of lower extremities with other complications 05/13/2013  . Chronic venous insufficiency 05/13/2013  . Chronic kidney disease, stage III (moderate) 05/12/2013  . Hypoxemia 03/31/2013  . Chronic kidney disease, stage IV (severe) (HCC) 03/30/2013  . SIRS (systemic inflammatory response syndrome) (HCC) 03/30/2013  . Diarrhea 03/30/2013  . DM2 (diabetes mellitus, type 2) (HCC) 01/05/2013  . Chest pain 09/22/2012  . IDDM (insulin dependent diabetes mellitus) (HCC)   . Glaucoma   . OSA (obstructive sleep apnea)   . Gout   . Diastolic CHF, chronic (HCC)   . Essential hypertension, benign   . Hyperlipidemia   . Peripheral neuropathy (HCC)   . Morbid obesity (HCC)   . Renal failure, chronic   . Peripheral edema     Mardi Cannady W. 06/23/2016, 2:07 PM Gean Maidens., PT [facility] [address] [place], [place], [address] Phone: [phone]   Fax:  [phone]  Name: Alexander Hubbard MRN: [id number] Date of Birth: [birth year]

## 2016-06-23 NOTE — Patient Instructions (Addendum)
KNEE: Extension, Long Arc Quads - Sitting    Raise leg until knee is straight. __10-20_ reps per set,holding 3-5 seconds.  Do this once per day.  Copyright  VHI. All rights reserved.  FUNCTIONAL MOBILITY: Marching (Seated)    Sitting in your chair, raise left leg then right to march in place. Alternate. _10-20__ reps per set, _1-2__ sets per day.  Copyright  VHI. All rights reserved.  ANKLE: Pumps    In sitting, point toes down, then up. _10-20__ reps per set, 1-2___ sets per day.  Copyright  VHI. All rights reserved.  STANDING AT THE COUNTER FOR SUPPORT:  SINGLE LIMB STANCE    Stance: single leg on floor. Raise leg. Hold _10__ seconds. Repeat with other leg. 3___ reps per set, __1-2_ sets per day.  Stand as tall as you can and hold on lightly with one hand.  Copyright  VHI. All rights reserved.  "I love a Parade" Lift    Using a chair if necessary, march in place 10-20 reps, with support at counter. Repeat _10-20___ times. Do _1-2___ sessions per day.  http://gt2.exer.us/344   Tandem Stance    Right foot in front of left, heel touching toe both feet "straight ahead". Stand on Foot Triangle of Support with both feet. Balance in this position 10-15_ seconds. Do with left foot in front of right.  Stand at counter as tall as you can, with light support of 1-2 hands.  Do this 3 times, each leg.  Copyright  VHI. All rights reserved.    Standing Hip Abduction    While standing, raise your leg out to the side. Keep your knee straight and maintain your toes pointed forward the entire time.  Repeat 10 times for 1-2 sets.  1-2 sessions per day.  Use your arms for support if needed for balance and safety.  Standing Hip Extension    While standing, balance on one leg and move your other leg in a backward direction. Do not swing the leg. Perform smooth and controlled movements.   Keep your trunk stable and without arching during the movement.  Repeat 10 times  for 1-2 sets.  Perform 1-2 sessions per day.  Use your arms for support if needed for balance and safety.

## 2016-07-02 ENCOUNTER — Telehealth: Payer: Self-pay | Admitting: *Deleted

## 2016-07-02 ENCOUNTER — Ambulatory Visit: Payer: Medicaid Other | Admitting: Physical Therapy

## 2016-07-02 NOTE — Telephone Encounter (Signed)
Called patient and stated that his insurance will not cover shoes and that Dr Ardelle AntonWagoner had filled out a [employer] form and that the patient could pick it up and take it and I stated that we would have a pedothist would be working for us at the end of February and we could see then about getting diabetic shoes if [employer] does not work out. Misty StanleyLisa

## 2016-07-03 ENCOUNTER — Ambulatory Visit: Payer: Medicaid Other | Attending: Family Medicine | Admitting: Physical Therapy

## 2016-07-03 ENCOUNTER — Encounter: Payer: Self-pay | Admitting: Physical Therapy

## 2016-07-03 ENCOUNTER — Ambulatory Visit: Payer: Self-pay | Admitting: Cardiology

## 2016-07-03 DIAGNOSIS — M6281 Muscle weakness (generalized): Secondary | ICD-10-CM | POA: Insufficient documentation

## 2016-07-03 DIAGNOSIS — R2689 Other abnormalities of gait and mobility: Secondary | ICD-10-CM | POA: Insufficient documentation

## 2016-07-03 DIAGNOSIS — R262 Difficulty in walking, not elsewhere classified: Secondary | ICD-10-CM | POA: Diagnosis present

## 2016-07-03 NOTE — Therapy (Signed)
[facility] [address] [place], [place], [address] Phone: [phone]   Fax:  [phone]  Physical Therapy Treatment  Patient Details  Name: Alexander BouquetRiyad Hubbard MRN: [id number] Date of Birth: 01-04-1956 Referring Provider: Rosana FretJustin Kim  Encounter Date: 07/03/2016      PT End of Session - 07/03/16 1207    Visit Number 3   Number of Visits 4   Authorization Type Medicaid-Authorization submitted following eval-awaiting auth   Authorization Time Period 3 visits approved, from 06/16/16-07/27/16   Authorization - Visit Number 1   Authorization - Number of Visits 3   PT Start Time 1105   PT Stop Time 1143   PT Time Calculation (min) 38 min   Activity Tolerance Patient tolerated treatment well   Behavior [facility] for tasks assessed/performed      Past Medical History:  Diagnosis Date  . AKI (acute kidney injury) (HCC) 07/2015  . CHF (congestive heart failure) (HCC)   . Diabetes mellitus   . Glaucoma (increased eye pressure)   . Gout attack 07/2015   RIGHT HAND LEFT SHOULDER RIGHT KNEE DOWN   . Gouty arthritis   . Hypertension   . Neuropathy of both feet   . Obesity   . Sleep apnea   . UTI (urinary tract infection) 07/2015  . Varicose veins     Past Surgical History:  Procedure Laterality Date  . CHOLECYSTECTOMY, LAPAROSCOPIC     [facility] complicated by post op abdominal abscess-E Faecium  . EYE SURGERY    . GASTRIC BYPASS    . KNEE SURGERY      There were no vitals filed for this visit.      Subjective Assessment - 07/03/16 1107    Subjective Pt reports that he's been working on HEP at home. Pt is working on stationary bike regularly. Feels like he's getting stronger; still has difficulty carrying a load up the stairs. Aslo has been walking and working on  gradual inclines. Pt prefers to be called "Alexander Hubbard"   Patient is accompained by: Family member  wife   Limitations Standing   How long  can you stand comfortably? Limited standing 1-2 minutes   How long can you walk comfortably? Limited 5 minutes   Patient Stated Goals Pt's goal for therapy  improve my balance and get up on steps.                         [facility] Adult PT Treatment/Exercise - 07/03/16 0001      Knee/Hip Exercises: Standing   Lateral Step Up Right;Left;1 set;5 reps;10 reps;Hand Hold: 2;Step Height: 6"  Emphasis was more on eccentric control stepping down, limite   Forward Step Up Right;Left;1 set;10 reps;Hand Hold: 2;Step Height: 6"   Other Standing Knee Exercises sit<>stands 5x2 without UE support             Balance Exercises - 07/03/16 1202      Balance Exercises: Standing   Other Standing Exercises alternate tapping on 6" working on decreasing UE support.           PT Education - 07/03/16 1205    Education provided Yes   Education Details Primary and PTA discussed pt visit limit and what he might expect.     Person(s) Educated Patient   Methods Explanation   Comprehension Verbalized understanding             PT Long Term Goals -  06/06/16 1250      PT LONG TERM GOAL #1   Title Pt will be independent with HEP for strengthening, balance, and gait.     Baseline No current HEP   Time 3   Period Weeks   Status New     PT LONG TERM GOAL #2   Title Pt will improve TUG score to less than or equal to 13.5 seconds for decreased fall risk.   Baseline TUG 15.22 sec; Scores >13.5 sec indicate increased fall risk.   Time 3   Period Weeks   Status New     PT LONG TERM GOAL #3   Title Berg Balance score to be completed, with pt to improve score by at least 7 points for decreased fall risk.   Baseline Initiated Berg, unable to complete due to BP issues.  Pt has had one fall in past 6 months.   Time 3   Period Weeks   Status New     PT LONG TERM GOAL #4   Title Pt will improve ABC score by 20% for improved balance confidence.   Baseline ABC score 19.4%   Time 3    Period Weeks   Status New     PT LONG TERM GOAL #5   Title Pt/family will verbalize understanding of fall prevention in the home environment.   Baseline Pt at fall risk per TUG, one fall in past 6 months   Time 3   Period Weeks   Status New               Plan - 07/03/16 1208    Clinical Impression Statement Pt is making good progress with performing HEP at home and working out on stationary bike.  Pt continues to report having trouble carrying a load up steps and during session noted significant right knee weakness when stepping down step.  Updated HEP to include functional quad strengthening and control exercises.                                                    Rehab Potential Good   Clinical Impairments Affecting Rehab Potential BP dropping with positional changes, ongoing medical complexity (MD is aware)   PT Frequency 1x / week  PT recommends 2x/wk for 6-8 wks, but limited due to insurance   PT Duration 3 weeks  Over 8-12 week period   PT Treatment/Interventions DME Instruction;Gait training;Stair training;Functional mobility training;Therapeutic activities;Therapeutic exercise;Balance training;Neuromuscular re-education;Patient/family education   PT Next Visit Plan Discuss outcome of visit limit. assess stairs; Review HEP and continue to address strengthening and balance; discuss walking program   Consulted and Agree with Plan of Care Patient;Family member/caregiver   Family Member Consulted wife      Patient will benefit from skilled therapeutic intervention in order to improve the following deficits and impairments:  Abnormal gait, Decreased activity tolerance, Decreased balance, Decreased strength, Decreased knowledge of use of DME, Decreased mobility, Decreased range of motion, Difficulty walking  Visit Diagnosis: Muscle weakness (generalized)  Other abnormalities of gait and mobility  Difficulty in walking, not elsewhere classified     Problem List Patient  Active Problem List   Diagnosis Date Noted  . Critical lower limb ischemia 01/18/2016  . Acute gout   . CKD (chronic kidney disease) stage 3, GFR 30-59 ml/min 07/18/2015  . Acute  renal failure (HCC) 07/18/2015  . AKI (acute kidney injury) (HCC) 07/17/2015  . Diabetes mellitus with complication (HCC) 07/17/2015  . Gout attack 07/17/2015  . PVD (peripheral vascular disease) (HCC) 07/17/2015  . Abnormal urine 07/17/2015  . Protruded cervical disc 09/21/2014  . Neck pain 09/21/2014  . Headache 08/03/2014  . Accidental drug overdose 09/19/2013  . Syncope 08/04/2013  . Acute bronchitis 06/22/2013  . Swelling of limb 05/13/2013  . Pain in limb 05/13/2013  . Varicose veins of lower extremities with other complications 05/13/2013  . Chronic venous insufficiency 05/13/2013  . Chronic kidney disease, stage III (moderate) 05/12/2013  . Hypoxemia 03/31/2013  . Chronic kidney disease, stage IV (severe) (HCC) 03/30/2013  . SIRS (systemic inflammatory response syndrome) (HCC) 03/30/2013  . Diarrhea 03/30/2013  . DM2 (diabetes mellitus, type 2) (HCC) 01/05/2013  . Chest pain 09/22/2012  . IDDM (insulin dependent diabetes mellitus) (HCC)   . Glaucoma   . OSA (obstructive sleep apnea)   . Gout   . Diastolic CHF, chronic (HCC)   . Essential hypertension, benign   . Hyperlipidemia   . Peripheral neuropathy (HCC)   . Morbid obesity (HCC)   . Renal failure, chronic   . Peripheral edema     Hortencia Conradi, PTA  07/03/16, 12:13 PM [facility] [address] [place], [place], [address] Phone: [phone]   Fax:  [phone]  Name: Alexander Hubbard MRN: [id number] Date of Birth: 1955/11/23

## 2016-07-03 NOTE — Patient Instructions (Addendum)
Functional Quadriceps: Sit to Stand    Sit on edge of chair, feet flat on floor. Stand upright, extending knees fully. Repeat __5__ times per set. Do __2__ sets per session. Do __1-2__ sessions per day.  http://orth.exer.us/734   Copyright  VHI. All rights reserved.  Alternating Quick Step    Stand facing step. alternating feet, TAP steps. Decrease hand support as possible Perform _10__ reps x2  Copyright  VHI. All rights reserved.  Alternating Step Up / Down    1. Stand facing step. Step up with one leg x10 with each.   FUNCTIONAL MOBILITY: Lateral Step Up    FACE rail; Step up sideways, leading with right leg. _5-10__ reps per set, 1-2___ sets per day. Copyright  VHI. All rights reserved.

## 2016-07-07 ENCOUNTER — Encounter: Payer: Self-pay | Admitting: Physical Therapy

## 2016-07-07 ENCOUNTER — Ambulatory Visit: Payer: Medicaid Other | Admitting: Physical Therapy

## 2016-07-07 DIAGNOSIS — R262 Difficulty in walking, not elsewhere classified: Secondary | ICD-10-CM

## 2016-07-07 DIAGNOSIS — M6281 Muscle weakness (generalized): Secondary | ICD-10-CM

## 2016-07-07 DIAGNOSIS — R2689 Other abnormalities of gait and mobility: Secondary | ICD-10-CM

## 2016-07-07 NOTE — Therapy (Signed)
[employer] [address] [place], [place], [address] Phone: [phone]   Fax:  [phone]  Physical Therapy Treatment  Patient Details  Name: Alexander Hubbard MRN: [id number] Date of Birth: 1955-10-11 Referring Provider: Griselda Miner  Encounter Date: 07/07/2016      PT End of Session - 07/07/16 1255    Visit Number 4   Number of Visits 4   Authorization Type Medicaid-Authorization submitted following eval-awaiting auth   Authorization Time Period 3 visits approved, from 06/16/16-07/27/16   Authorization - Visit Number 1   Authorization - Number of Visits 3   PT Start Time 0933   PT Stop Time 1013   PT Time Calculation (min) 40 min   Activity Tolerance Patient tolerated treatment well   Behavior During Therapy [facility] for tasks assessed/performed      Past Medical History:  Diagnosis Date  . AKI (acute kidney injury) ([place]) 07/2015  . CHF (congestive heart failure) ([place])   . Diabetes mellitus   . Glaucoma (increased eye pressure)   . Gout attack 07/2015   RIGHT HAND LEFT SHOULDER RIGHT KNEE DOWN   . Gouty arthritis   . Hypertension   . Neuropathy of both feet   . Obesity   . Sleep apnea   . UTI (urinary tract infection) 07/2015  . Varicose veins     Past Surgical History:  Procedure Laterality Date  . CHOLECYSTECTOMY, LAPAROSCOPIC     51/7001 at [facility] complicated by post op abdominal abscess-E Faecium  . EYE SURGERY    . GASTRIC BYPASS    . KNEE SURGERY      There were no vitals filed for this visit.      Subjective Assessment - 07/07/16 0937    Subjective Pt is okay with being put on hold for now.  Pt had some right knee pain with side step up exercise.  Pt prefers to be called "Alexander Hubbard"   Patient is accompained by: Family member  wife   Limitations Standing   How long can you stand comfortably? Limited standing 1-2 minutes   How long can you walk comfortably? Limited 5 minutes   Patient Stated Goals  Pt's goal for therapy  improve my balance and get up on steps.   Currently in Pain? No/denies            [facility] PT Assessment - 07/07/16 0001      Berg Balance Test   Sit to Stand Able to stand  independently using hands   Standing Unsupported Able to stand safely 2 minutes   Sitting with Back Unsupported but Feet Supported on Floor or Stool Able to sit safely and securely 2 minutes   Stand to Sit Sits safely with minimal use of hands   Transfers Able to transfer safely, minor use of hands   Standing Unsupported with Eyes Closed Able to stand 10 seconds safely   Standing Ubsupported with Feet Together Able to place feet together independently and stand 1 minute safely   From Standing, Reach Forward with Outstretched Arm Can reach forward >5 cm safely (2")   From Standing Position, Pick up Object from Floor Unable to pick up and needs supervision   From Standing Position, Turn to Look Behind Over each Shoulder Looks behind one side only/other side shows less weight shift   Turn 360 Degrees Able to turn 360 degrees safely one side only in 4 seconds or less   Standing Unsupported, Alternately Place Feet on Step/Stool Able  to complete >2 steps/needs minimal assist   Standing Unsupported, One Foot in Front Able to plae foot ahead of the other independently and hold 30 seconds   Standing on One Leg Unable to try or needs assist to prevent fall   Total Score 40   Berg comment: Scores <45/56 indicate increased fall risk.     Timed Up and Go Test   TUG Normal TUG   Normal TUG (seconds) 8.62   TUG Comments Scores >13.5 seconds indicate increased fall risk.                             PT Education - 07/07/16 1253    Education provided Yes   Education Details Discussed the option of being put on hold due to insurance coverage, goals checked, safety recommendations at current level of mobility, and  info on [employer]   Person(s) Educated Patient;Spouse    Methods Explanation;Demonstration;Handout   Comprehension Verbalized understanding             PT Long Term Goals - 07/07/16 1256      PT LONG TERM GOAL #1   Title Pt will be independent with HEP for strengthening, balance, and gait.     Baseline Met; 07/06/16   Time 3   Period Weeks   Status Achieved     PT LONG TERM GOAL #2   Title Pt will improve TUG score to less than or equal to 13.5 seconds for decreased fall risk.   Baseline TUG 8.26 sec; Scores >13.5 sec indicate increased fall risk.   Time 3   Period Weeks   Status Achieved     PT LONG TERM GOAL #3   Title Berg Balance score 22/56, with pt to improve score by at least 7 points for decreased fall risk.   Baseline Met, 40/56; 07/07/16.   Time 3   Period Weeks   Status Achieved     PT LONG TERM GOAL #4   Title Pt will improve ABC score by 20% for improved balance confidence.   Baseline ABC score 19.4%   Time 3   Period Weeks   Status Unable to assess  due to time.     PT LONG TERM GOAL #5   Title Pt/family will verbalize understanding of fall prevention in the home environment.   Baseline Met, 07/07/16.   Time 3   Period Weeks   Status Achieved               Plan - 07/07/16 1301    Clinical Impression Statement Pt has made good progress meeting all mobility goals.  Pt would like to continue but is willing to be put on hold due to finances.  Gave pt information on continuing fitness options.   Rehab Potential Good   Clinical Impairments Affecting Rehab Potential BP dropping with positional changes, ongoing medical complexity (MD is aware)   PT Frequency 1x / week  PT recommends 2x/wk for 6-8 wks, but limited due to insurance   PT Duration 3 weeks  Over 8-12 week period   PT Treatment/Interventions DME Instruction;Gait training;Stair training;Functional mobility training;Therapeutic activities;Therapeutic exercise;Balance training;Neuromuscular re-education;Patient/family education   PT Next Visit Plan  Discuss outcome of visit limit.   Consulted and Agree with Plan of Care Patient;Family member/caregiver   Family Member Consulted wife      Patient will benefit from skilled therapeutic intervention in order to improve the following deficits and  impairments:  Abnormal gait, Decreased activity tolerance, Decreased balance, Decreased strength, Decreased knowledge of use of DME, Decreased mobility, Decreased range of motion, Difficulty walking  Visit Diagnosis: Difficulty in walking, not elsewhere classified  Other abnormalities of gait and mobility  Muscle weakness (generalized)     Problem List Patient Active Problem List   Diagnosis Date Noted  . Critical lower limb ischemia 01/18/2016  . Acute gout   . CKD (chronic kidney disease) stage 3, GFR 30-59 ml/min 07/18/2015  . Acute renal failure ([place]) 07/18/2015  . AKI (acute kidney injury) ([place]) 07/17/2015  . Diabetes mellitus with complication ([place]) 99/35/7017  . Gout attack 07/17/2015  . PVD (peripheral vascular disease) ([place]) 07/17/2015  . Abnormal urine 07/17/2015  . Protruded cervical disc 09/21/2014  . Neck pain 09/21/2014  . Headache 08/03/2014  . Accidental drug overdose 09/19/2013  . Syncope 08/04/2013  . Acute bronchitis 06/22/2013  . Swelling of limb 05/13/2013  . Pain in limb 05/13/2013  . Varicose veins of lower extremities with other complications 79/39/0300  . Chronic venous insufficiency 05/13/2013  . Chronic kidney disease, stage III (moderate) 05/12/2013  . Hypoxemia 03/31/2013  . Chronic kidney disease, stage IV (severe) ([place]) 03/30/2013  . SIRS (systemic inflammatory response syndrome) ([place]) 03/30/2013  . Diarrhea 03/30/2013  . DM2 (diabetes mellitus, type 2) ([place]) 01/05/2013  . Chest pain 09/22/2012  . IDDM (insulin dependent diabetes mellitus) ([place])   . Glaucoma   . OSA (obstructive sleep apnea)   . Gout   . Diastolic CHF, chronic ([place])   . Essential hypertension, benign   . Hyperlipidemia   .  Peripheral neuropathy ([place])   . Morbid obesity ([place])   . Renal failure, chronic   . Peripheral edema    Bjorn Loser, PTA  07/07/16, 1:03 PM [place] [address] [place], [place], [address] Phone: [phone]   Fax:  [phone]  Name: Alexander Hubbard MRN: [id number] Date of Birth: 03/06/1956

## 2016-07-09 ENCOUNTER — Ambulatory Visit: Payer: Medicaid Other | Admitting: Physical Therapy

## 2016-07-11 ENCOUNTER — Ambulatory Visit: Payer: Medicaid Other | Admitting: Podiatry

## 2016-07-18 ENCOUNTER — Ambulatory Visit (INDEPENDENT_AMBULATORY_CARE_PROVIDER_SITE_OTHER): Payer: Medicaid Other | Admitting: Family Medicine

## 2016-07-18 VITALS — BP 130/64 | HR 58 | Temp 97.9°F | Resp 18 | Ht 68.0 in | Wt 251.0 lb

## 2016-07-18 DIAGNOSIS — D631 Anemia in chronic kidney disease: Secondary | ICD-10-CM | POA: Diagnosis not present

## 2016-07-18 DIAGNOSIS — N183 Chronic kidney disease, stage 3 (moderate): Secondary | ICD-10-CM

## 2016-07-18 LAB — COMPLETE METABOLIC PANEL WITH GFR
ALT: 49 U/L — ABNORMAL HIGH (ref 9–46)
AST: 42 U/L — AB (ref 10–35)
Albumin: 3.7 g/dL (ref 3.6–5.1)
Alkaline Phosphatase: 69 U/L (ref 40–115)
BUN: 89 mg/dL — AB (ref 7–25)
CALCIUM: 9 mg/dL (ref 8.6–10.3)
CHLORIDE: 105 mmol/L (ref 98–110)
CO2: 20 mmol/L (ref 20–31)
Creat: 3.14 mg/dL — ABNORMAL HIGH (ref 0.70–1.25)
GFR, Est African American: 24 mL/min — ABNORMAL LOW (ref >=60)
GFR, Est Non African American: 20 mL/min — ABNORMAL LOW (ref >=60)
Glucose, Bld: 130 mg/dL — ABNORMAL HIGH (ref 70–99)
POTASSIUM: 4.6 mmol/L (ref 3.5–5.3)
SODIUM: 139 mmol/L (ref 135–146)
Total Bilirubin: 0.4 mg/dL (ref 0.2–1.2)
Total Protein: 6.6 g/dL (ref 6.1–8.1)

## 2016-07-18 LAB — HEMOGLOBIN, FINGERSTICK: Hemoglobin, fingerstick: 11.4 g/dL — ABNORMAL LOW (ref 14.0–18.0)

## 2016-07-18 NOTE — Progress Notes (Signed)
Subjective:    Patient ID: Alexander Hubbard, male    DOB: [birth year], 61 y.o.   MRN: [id number]  HPI  04/2016 Was seen in emergency room on November 1. Stood up from his recliner quickly and started to walk. Became lightheaded and passed out. Struck his head sustaining a superficial laceration to his crown. Bruised his left ribs posteriorly. Emergency room evaluation was significant for an elevation in his creatinine from 1.9 which is the baseline to 2.7. Hemoglobin was also low at 9.0. This time last year he was 10.8. He is taking his B12 as well as his iron tablets. He denies any blood loss. Chronic kidney disease does seem to be worsening. At that time, my plan was: Patient lost consciousness due to a drop in his blood pressure upon standing. Lab work was concerning for worsening renal function as a sign of dehydration and also worsening anemia. Certainly has a reason to be anemic given his chronic kidney disease. However I feel we need to rule out GI blood loss. Therefore 1 have the patient discontinue his iron for 48 hours and then perform fecal occult blood cards 3. I will also recheck his renal function and his hemoglobin to ensure that they are not deteriorating. May need to consider discontinuing antihypertensive medication if he continues to have orthostatic dizziness.  07/18/16 I recheck the patient's hemoglobin in January and found to be 8.5. His iron level is extremely low as was his percent saturation. The patient's been taking an iron tablet once a day. However today he presents complaining of orthostatic dizziness. Orthostatic vitals were significant only for a 10 point drop in his systolic blood pressure upon standing. However his heart rate remained stable in the 60s. A fingerstick hemoglobin today was greater than 11. Therefore this makes me concerned that the patient could be dehydrated in addition to his anemia Past Medical History:  Diagnosis Date  . AKI (acute kidney injury) ([facility])  07/2015  . CHF (congestive heart failure) ([facility])   . Diabetes mellitus   . Glaucoma (increased eye pressure)   . Gout attack 07/2015   RIGHT HAND LEFT SHOULDER RIGHT KNEE DOWN   . Gouty arthritis   . Hypertension   . Neuropathy of both feet   . Obesity   . Sleep apnea   . UTI (urinary tract infection) 07/2015  . Varicose veins    Past Surgical History:  Procedure Laterality Date  . CHOLECYSTECTOMY, LAPAROSCOPIC     05/2016 at [facility] complicated by post op abdominal abscess-E Faecium  . EYE SURGERY    . GASTRIC BYPASS    . KNEE SURGERY     Current Outpatient Prescriptions on File Prior to Visit  Medication Sig Dispense Refill  . aspirin 81 MG tablet Take 81 mg by mouth daily.    . brinzolamide (AZOPT) 1 % ophthalmic suspension Place 1 drop into the left eye 2 (two) times daily.     . Calcium Citrate-Vitamin D (CALCIUM + D PO) Take 1 tablet by mouth 2 (two) times daily.     . carvedilol (COREG) 3.125 MG tablet Take 3.125 mg by mouth daily.     . colchicine 0.6 MG tablet Take 0.6 mg by mouth daily.    Marland Kitchen docusate sodium (COLACE) 100 MG capsule Take 100 mg by mouth 2 (two) times daily.    . ferrous sulfate 325 (65 FE) MG tablet Take 325 mg by mouth daily with breakfast.    . furosemide (LASIX) 40 MG tablet  Take 40 mg by mouth daily.   2  . gabapentin (NEURONTIN) 300 MG capsule TAKE 2 CAPSULES BY MOUTH 3 TIMES A DAY 180 capsule 3  . glucose blood (ACCU-CHEK SMARTVIEW) test strip 1 each by Other route See admin instructions. Check blood sugar 3 times daily.    Marland Kitchen. latanoprost (XALATAN) 0.005 % ophthalmic solution Place 1 drop into the left eye at bedtime.     Marland Kitchen. linaclotide (LINZESS) 145 MCG CAPS capsule Take 145 mcg by mouth daily before breakfast.    . Multiple Vitamins-Minerals (MULTIVITAMIN & MINERAL PO) Take 1 tablet by mouth 2 (two) times daily.     . pantoprazole (PROTONIX) 40 MG tablet TAKE 1 TABLET TWICE A DAY 60 tablet 8  . pentoxifylline (TRENTAL) 400 MG CR tablet Take 400 mg by  mouth 3 (three) times daily with meals.    . rosuvastatin (CRESTOR) 20 MG tablet TAKE 1 TABLET BY MOUTH DAILY 30 tablet 3  . sodium bicarbonate 650 MG tablet Take 650 mg by mouth 3 (three) times daily.     . timolol (TIMOPTIC) 0.5 % ophthalmic solution Place 1 drop into both eyes 2 (two) times daily.    Marland Kitchen. ULORIC 40 MG tablet Take 1 tablet (40 mg total) by mouth daily. 30 tablet 5  . UNABLE TO FIND Dispense one pair of Diabetic Shoes. ICD E11.49, G62.9, B35.1, R60.9 1 each 0  . valsartan (DIOVAN) 40 MG tablet Take 40 mg by mouth daily.    . vitamin B-12 (CYANOCOBALAMIN) 1000 MCG tablet Take 1,000 mcg by mouth. (Chewable tablet)     No current facility-administered medications on file prior to visit.    Allergies  Allergen Reactions  . Penicillins Swelling and Rash  . Amoxicillin Swelling and Rash  . Niacin Itching and Other (See Comments)    Other reaction(s): Flushing (ALLERGY/intolerance)  . Penicillin G Rash   Social History   Social History  . Marital status: Married    Spouse name: N/A  . Number of children: N/A  . Years of education: N/A   Occupational History  . Not on file.   Social History Main Topics  . Smoking status: Never Smoker  . Smokeless tobacco: Never Used  . Alcohol use No  . Drug use: No  . Sexual activity: Not on file   Other Topics Concern  . Not on file   Social History Narrative  . No narrative on file      Review of Systems  Neurological: Positive for dizziness.  All other systems reviewed and are negative.      Objective:   Physical Exam  Constitutional: He appears well-developed and well-nourished.  Cardiovascular: Normal rate, regular rhythm and normal heart sounds.   Pulmonary/Chest: Effort normal and breath sounds normal. No respiratory distress. He has no wheezes. He has no rales.  Abdominal: Soft. Bowel sounds are normal.  Musculoskeletal: He exhibits edema.  Vitals reviewed.         Assessment & Plan:  Anemia in stage  3 chronic kidney disease - Plan: COMPLETE METABOLIC PANEL WITH GFR, Hemoglobin, fingerstick  Patient's anemia is much better than I expected. I asked him to continue taking iron however his hemoglobin does not appear to be the source of his orthostatic dizziness. I will have the patient temporarily discontinue his Lasix and his carvedilol and check him again next week to see if his symptoms improved

## 2016-07-19 LAB — CBC WITH DIFFERENTIAL/PLATELET
BASOS ABS: 77 {cells}/uL (ref 0–200)
Basophils Relative: 1 %
EOS ABS: 231 {cells}/uL (ref 15–500)
Eosinophils Relative: 3 %
HCT: 33.8 % — ABNORMAL LOW (ref 38.5–50.0)
HEMOGLOBIN: 10.7 g/dL — AB (ref 13.0–17.0)
Lymphocytes Relative: 46 %
Lymphs Abs: 3542 cells/uL (ref 850–3900)
MCH: 27 pg (ref 27.0–33.0)
MCHC: 31.7 g/dL — ABNORMAL LOW (ref 32.0–36.0)
MCV: 85.1 fL (ref 80.0–100.0)
MONOS PCT: 6 %
MPV: 11.1 fL (ref 7.5–12.5)
Monocytes Absolute: 462 cells/uL (ref 200–950)
NEUTROS ABS: 3388 {cells}/uL (ref 1500–7800)
Neutrophils Relative %: 44 %
PLATELETS: 137 10*3/uL — AB (ref 140–400)
RBC: 3.97 MIL/uL — ABNORMAL LOW (ref 4.20–5.80)
RDW: 16.2 % — ABNORMAL HIGH (ref 11.0–15.0)
WBC: 7.7 10*3/uL (ref 3.8–10.8)

## 2016-07-22 ENCOUNTER — Ambulatory Visit (INDEPENDENT_AMBULATORY_CARE_PROVIDER_SITE_OTHER): Payer: Medicaid Other | Admitting: Family Medicine

## 2016-07-22 ENCOUNTER — Encounter: Payer: Self-pay | Admitting: Family Medicine

## 2016-07-22 ENCOUNTER — Ambulatory Visit (INDEPENDENT_AMBULATORY_CARE_PROVIDER_SITE_OTHER): Payer: Medicaid Other | Admitting: Cardiology

## 2016-07-22 ENCOUNTER — Encounter: Payer: Self-pay | Admitting: Cardiology

## 2016-07-22 VITALS — BP 130/70 | HR 68 | Ht 71.0 in | Wt 254.4 lb

## 2016-07-22 VITALS — BP 126/72 | HR 68 | Temp 98.0°F | Resp 18 | Ht 68.0 in | Wt 253.0 lb

## 2016-07-22 DIAGNOSIS — N183 Chronic kidney disease, stage 3 unspecified: Secondary | ICD-10-CM

## 2016-07-22 DIAGNOSIS — I10 Essential (primary) hypertension: Secondary | ICD-10-CM

## 2016-07-22 DIAGNOSIS — N184 Chronic kidney disease, stage 4 (severe): Secondary | ICD-10-CM | POA: Diagnosis not present

## 2016-07-22 DIAGNOSIS — E7800 Pure hypercholesterolemia, unspecified: Secondary | ICD-10-CM

## 2016-07-22 LAB — COMPLETE METABOLIC PANEL WITH GFR
ALK PHOS: 73 U/L (ref 40–115)
ALT: 69 U/L — ABNORMAL HIGH (ref 9–46)
AST: 72 U/L — ABNORMAL HIGH (ref 10–35)
Albumin: 3.5 g/dL — ABNORMAL LOW (ref 3.6–5.1)
BILIRUBIN TOTAL: 0.4 mg/dL (ref 0.2–1.2)
BUN: 76 mg/dL — ABNORMAL HIGH (ref 7–25)
CO2: 21 mmol/L (ref 20–31)
Calcium: 9.1 mg/dL (ref 8.6–10.3)
Chloride: 109 mmol/L (ref 98–110)
Creat: 2.56 mg/dL — ABNORMAL HIGH (ref 0.70–1.25)
GFR, EST AFRICAN AMERICAN: 30 mL/min — AB (ref >=60)
GFR, EST NON AFRICAN AMERICAN: 26 mL/min — AB (ref >=60)
Glucose, Bld: 116 mg/dL — ABNORMAL HIGH (ref 70–99)
POTASSIUM: 4.5 mmol/L (ref 3.5–5.3)
Sodium: 140 mmol/L (ref 135–146)
TOTAL PROTEIN: 6.2 g/dL (ref 6.1–8.1)

## 2016-07-22 NOTE — Progress Notes (Signed)
Subjective:    Patient ID: Alexander Hubbard, male    DOB: July 16, 1955, 61 y.o.   MRN: [id number]  Dizziness     04/2016 Was seen in emergency room on November 1. Stood up from his recliner quickly and started to walk. Became lightheaded and passed out. Struck his head sustaining a superficial laceration to his crown. Bruised his left ribs posteriorly. Emergency room evaluation was significant for an elevation in his creatinine from 1.9 which is the baseline to 2.7. Hemoglobin was also low at 9.0. This time last year he was 10.8. He is taking his B12 as well as his iron tablets. He denies any blood loss. Chronic kidney disease does seem to be worsening. At that time, my plan was: Patient lost consciousness due to a drop in his blood pressure upon standing. Lab work was concerning for worsening renal function as a sign of dehydration and also worsening anemia. Certainly has a reason to be anemic given his chronic kidney disease. However I feel we need to rule out GI blood loss. Therefore 1 have the patient discontinue his iron for 48 hours and then perform fecal occult blood cards 3. I will also recheck his renal function and his hemoglobin to ensure that they are not deteriorating. May need to consider discontinuing antihypertensive medication if he continues to have orthostatic dizziness.  07/18/16 I recheck the patient's hemoglobin in January and found to be 8.5. His iron level is extremely low as was his percent saturation. The patient's been taking an iron tablet once a day. However today he presents complaining of orthostatic dizziness. Orthostatic vitals were significant only for a 10 point drop in his systolic blood pressure upon standing. However his heart rate remained stable in the 60s. A fingerstick hemoglobin today was greater than 11. Therefore this makes me concerned that the patient could be dehydrated in addition to his anemia.  At that time, my plan was: Patient's anemia is much better than  I expected. I asked him to continue taking iron however his hemoglobin does not appear to be the source of his orthostatic dizziness. I will have the patient temporarily discontinue his Lasix and his carvedilol and check him again next week to see if his symptoms improved  07/22/16 Lab work revealed an increase in his creatinine which I attributed to dehydration. It also revealed an elevation in his liver function tests is mild but I think it also reflect dehydration. Therefore I had the patient discontinue his carvedilol and his furosemide and recheck today. His blood pressure has improved and the patient feels much better. He called his nephrologist and they agreed with this plan. From a symptom standpoint the patient feels back to normal. However I do want to recheck his liver function tests and his renal function.  Office Visit on 07/18/2016  Component Date Value Ref Range Status  . Sodium 07/18/2016 139  135 - 146 mmol/L Final  . Potassium 07/18/2016 4.6  3.5 - 5.3 mmol/L Final  . Chloride 07/18/2016 105  98 - 110 mmol/L Final  . CO2 07/18/2016 20  20 - 31 mmol/L Final  . Glucose, Bld 07/18/2016 130* 70 - 99 mg/dL Final  . BUN 07/18/2016 89* 7 - 25 mg/dL Final  . Creat 07/18/2016 3.14* 0.70 - 1.25 mg/dL Final   Comment:   For patients > or = 61 years of age: The upper reference limit for Creatinine is approximately 13% higher for people identified as African-American.     . Total  Bilirubin 07/18/2016 0.4  0.2 - 1.2 mg/dL Final  . Alkaline Phosphatase 07/18/2016 69  40 - 115 U/L Final  . AST 07/18/2016 42* 10 - 35 U/L Final  . ALT 07/18/2016 49* 9 - 46 U/L Final  . Total Protein 07/18/2016 6.6  6.1 - 8.1 g/dL Final  . Albumin 07/18/2016 3.7  3.6 - 5.1 g/dL Final  . Calcium 07/18/2016 9.0  8.6 - 10.3 mg/dL Final  . GFR, Est African American 07/18/2016 24* >=60 mL/min Final  . GFR, Est Non African American 07/18/2016 20* >=60 mL/min Final  . Hemoglobin, fingerstick 07/18/2016 11.4* 14.0  - 18.0 g/dL Final  . WBC 07/18/2016 7.7  3.8 - 10.8 K/uL Final  . RBC 07/18/2016 3.97* 4.20 - 5.80 MIL/uL Final  . Hemoglobin 07/18/2016 10.7* 13.0 - 17.0 g/dL Final  . HCT 07/18/2016 33.8* 38.5 - 50.0 % Final  . MCV 07/18/2016 85.1  80.0 - 100.0 fL Final  . MCH 07/18/2016 27.0  27.0 - 33.0 pg Final  . MCHC 07/18/2016 31.7* 32.0 - 36.0 g/dL Final  . RDW 07/18/2016 16.2* 11.0 - 15.0 % Final  . Platelets 07/18/2016 137* 140 - 400 K/uL Final  . MPV 07/18/2016 11.1  7.5 - 12.5 fL Final  . Neutro Abs 07/18/2016 3388  1,500 - 7,800 cells/uL Final  . Lymphs Abs 07/18/2016 3542  850 - 3,900 cells/uL Final  . Monocytes Absolute 07/18/2016 462  200 - 950 cells/uL Final  . Eosinophils Absolute 07/18/2016 231  15 - 500 cells/uL Final  . Basophils Absolute 07/18/2016 77  0 - 200 cells/uL Final  . Neutrophils Relative % 07/18/2016 44  % Final  . Lymphocytes Relative 07/18/2016 46  % Final  . Monocytes Relative 07/18/2016 6  % Final  . Eosinophils Relative 07/18/2016 3  % Final  . Basophils Relative 07/18/2016 1  % Final  . Smear Review 07/18/2016 Criteria for review not met   Final    Past Medical History:  Diagnosis Date  . AKI (acute kidney injury) ([place]) 07/2015  . CHF (congestive heart failure) ([place])   . Diabetes mellitus   . Glaucoma (increased eye pressure)   . Gout attack 07/2015   RIGHT HAND LEFT SHOULDER RIGHT KNEE DOWN   . Gouty arthritis   . Hypertension   . Neuropathy of both feet   . Obesity   . Sleep apnea   . UTI (urinary tract infection) 07/2015  . Varicose veins    Past Surgical History:  Procedure Laterality Date  . CHOLECYSTECTOMY, LAPAROSCOPIC     95/6213 at [facility] complicated by post op abdominal abscess-E Faecium  . EYE SURGERY    . GASTRIC BYPASS    . KNEE SURGERY     Current Outpatient Prescriptions on File Prior to Visit  Medication Sig Dispense Refill  . aspirin 81 MG tablet Take 81 mg by mouth daily.    . brinzolamide (AZOPT) 1 % ophthalmic suspension  Place 1 drop into the left eye 2 (two) times daily.     . Calcium Citrate-Vitamin D (CALCIUM + D PO) Take 1 tablet by mouth 2 (two) times daily.     . carvedilol (COREG) 3.125 MG tablet Take 3.125 mg by mouth daily.     . colchicine 0.6 MG tablet Take 0.6 mg by mouth daily.    Marland Kitchen docusate sodium (COLACE) 100 MG capsule Take 100 mg by mouth 2 (two) times daily.    . ferrous sulfate 325 (65 FE) MG tablet Take 325  mg by mouth daily with breakfast.    . furosemide (LASIX) 40 MG tablet Take 40 mg by mouth daily.   2  . gabapentin (NEURONTIN) 300 MG capsule TAKE 2 CAPSULES BY MOUTH 3 TIMES A DAY 180 capsule 3  . glucose blood (ACCU-CHEK SMARTVIEW) test strip 1 each by Other route See admin instructions. Check blood sugar 3 times daily.    Marland Kitchen latanoprost (XALATAN) 0.005 % ophthalmic solution Place 1 drop into the left eye at bedtime.     Marland Kitchen linaclotide (LINZESS) 145 MCG CAPS capsule Take 145 mcg by mouth daily before breakfast.    . Multiple Vitamins-Minerals (MULTIVITAMIN & MINERAL PO) Take 1 tablet by mouth 2 (two) times daily.     . pantoprazole (PROTONIX) 40 MG tablet TAKE 1 TABLET TWICE A DAY 60 tablet 8  . pentoxifylline (TRENTAL) 400 MG CR tablet Take 400 mg by mouth 3 (three) times daily with meals.    . rosuvastatin (CRESTOR) 20 MG tablet TAKE 1 TABLET BY MOUTH DAILY 30 tablet 3  . sodium bicarbonate 650 MG tablet Take 650 mg by mouth 3 (three) times daily.     . timolol (TIMOPTIC) 0.5 % ophthalmic solution Place 1 drop into both eyes 2 (two) times daily.    Marland Kitchen ULORIC 40 MG tablet Take 1 tablet (40 mg total) by mouth daily. 30 tablet 5  . UNABLE TO FIND Dispense one pair of Diabetic Shoes. ICD E11.49, G62.9, B35.1, R60.9 1 each 0  . valsartan (DIOVAN) 40 MG tablet Take 40 mg by mouth daily.    . vitamin B-12 (CYANOCOBALAMIN) 1000 MCG tablet Take 1,000 mcg by mouth. (Chewable tablet)     No current facility-administered medications on file prior to visit.    Allergies  Allergen Reactions  .  Penicillins Swelling and Rash  . Amoxicillin Swelling and Rash  . Niacin Itching and Other (See Comments)    Other reaction(s): Flushing (ALLERGY/intolerance)  . Penicillin G Rash   Social History   Social History  . Marital status: Married    Spouse name: N/A  . Number of children: N/A  . Years of education: N/A   Occupational History  . Not on file.   Social History Main Topics  . Smoking status: Never Smoker  . Smokeless tobacco: Never Used  . Alcohol use No  . Drug use: No  . Sexual activity: Not on file   Other Topics Concern  . Not on file   Social History Narrative  . No narrative on file      Review of Systems  Neurological: Positive for dizziness.  All other systems reviewed and are negative.      Objective:   Physical Exam  Constitutional: He appears well-developed and well-nourished.  Cardiovascular: Normal rate, regular rhythm and normal heart sounds.   Pulmonary/Chest: Effort normal and breath sounds normal. No respiratory distress. He has no wheezes. He has no rales.  Abdominal: Soft. Bowel sounds are normal.  Musculoskeletal: He exhibits edema.  Vitals reviewed.         Assessment & Plan:  CKD (chronic kidney disease) stage 4, GFR 15-29 ml/min ([facility]) - Plan: COMPLETE METABOLIC PANEL WITH GFR  I believe his symptoms last week were due to dehydration on top of chronic anemia. Clinically the patient feels better since discontinuing his fluid pill and discontinuing the carvedilol. His blood pressure does not warrant resumption of the carvedilol. Therefore we will recheck a CMP today. If his renal function test has improved and  his liver function test has improved no further workup is necessary. If his liver function test are persistently elevated given his recent postoperative complications with his cholecystectomy, I will proceed with imaging of the abdomen.

## 2016-07-22 NOTE — Progress Notes (Signed)
[address], [address] [place], [place]  [address] Phone: [phone] Fax:  [phone]  Date:  07/22/2016   ID:  Alexander Hubbard, DOB 1956-03-19, MRN [id number]  PCP:  Leo Grosser, MD   History of Present Illness: Alexander Hubbard is a 61 y.o. male here for follow up post weight reduction surgery. Has lost significant amount of weight. He feels much better. Had prior issues with emesis. Endoscopy performed.  Ulcer has healed. He is no longer considered diabetic. His blood pressure medicines are being peeled off. Excellent.  He has a history of morbid obesity, hypertension, diabetes, chronic kidney disease, chronic diastolic heart failure, noncompliant obstructive sleep apnea CPAP.  NUC stress 07/06/15:   Nuclear stress EF: 60%.   There was no ST segment deviation noted during stress.   The study is normal.  Normal stress nuclear study with no ischemia or infarction; EF 60 with normal wall motion.  Also prior to this had admit in 07/2015 with mild AKI (2.7 from base 2.0), UTI. Sees nephrologist in [place].  He's been feeling some dizziness when standing up which has resolved after stopping some of his blood pressure agents.  No chest pain. He did have mildly increased liver enzymes he states.  Wt Readings from Last 3 Encounters:  07/22/16 254 lb 6.4 oz (115.4 kg)  07/22/16 253 lb (114.8 kg)  07/18/16 251 lb (113.9 kg)     Past Medical History:  Diagnosis Date  . AKI (acute kidney injury) (HCC) 07/2015  . CHF (congestive heart failure) (HCC)   . Diabetes mellitus   . Glaucoma (increased eye pressure)   . Gout attack 07/2015   RIGHT HAND LEFT SHOULDER RIGHT KNEE DOWN   . Gouty arthritis   . Hypertension   . Neuropathy of both feet   . Obesity   . Sleep apnea   . UTI (urinary tract infection) 07/2015  . Varicose veins     Past Surgical History:  Procedure Laterality Date  . CHOLECYSTECTOMY, LAPAROSCOPIC     05/2016 at [facility] complicated by post  op abdominal abscess-E Faecium  . EYE SURGERY    . GASTRIC BYPASS    . KNEE SURGERY      Current Outpatient Prescriptions  Medication Sig Dispense Refill  . aspirin 81 MG tablet Take 81 mg by mouth daily.    . brinzolamide (AZOPT) 1 % ophthalmic suspension Place 1 drop into the left eye 2 (two) times daily.     . Calcium Citrate-Vitamin D (CALCIUM + D PO) Take 1 tablet by mouth 2 (two) times daily.     . colchicine 0.6 MG tablet Take 0.6 mg by mouth daily.    Marland Kitchen docusate sodium (COLACE) 100 MG capsule Take 100 mg by mouth 2 (two) times daily.    . ferrous sulfate 325 (65 FE) MG tablet Take 325 mg by mouth daily with breakfast.    . furosemide (LASIX) 40 MG tablet Take 40 mg by mouth daily as needed for fluid or edema.   2  . gabapentin (NEURONTIN) 300 MG capsule TAKE 2 CAPSULES BY MOUTH 3 TIMES A DAY 180 capsule 3  . glucose blood (ACCU-CHEK SMARTVIEW) test strip 1 each by Other route See admin instructions. Check blood sugar 3 times daily.    Marland Kitchen latanoprost (XALATAN) 0.005 % ophthalmic solution Place 1 drop into the left eye at bedtime.     Marland Kitchen linaclotide (LINZESS) 145 MCG CAPS capsule Take 145 mcg by mouth  daily before breakfast.    . Multiple Vitamins-Minerals (MULTIVITAMIN & MINERAL PO) Take 1 tablet by mouth 2 (two) times daily.     . pantoprazole (PROTONIX) 40 MG tablet TAKE 1 TABLET TWICE A DAY 60 tablet 8  . pentoxifylline (TRENTAL) 400 MG CR tablet Take 400 mg by mouth 3 (three) times daily with meals.    . rosuvastatin (CRESTOR) 20 MG tablet TAKE 1 TABLET BY MOUTH DAILY 30 tablet 3  . sodium bicarbonate 650 MG tablet Take 650 mg by mouth 3 (three) times daily.     . timolol (TIMOPTIC) 0.5 % ophthalmic solution Place 1 drop into both eyes 2 (two) times daily.    Marland Kitchen. ULORIC 40 MG tablet Take 1 tablet (40 mg total) by mouth daily. 30 tablet 5  . UNABLE TO FIND Dispense one pair of Diabetic Shoes. ICD E11.49, G62.9, B35.1, R60.9 1 each 0  . valsartan (DIOVAN) 40 MG tablet Take 40 mg by  mouth daily.    . vitamin B-12 (CYANOCOBALAMIN) 1000 MCG tablet Take 1,000 mcg by mouth every other day. (Chewable tablet)     No current facility-administered medications for this visit.     Allergies:    Allergies  Allergen Reactions  . Penicillins Swelling and Rash  . Amoxicillin Swelling and Rash  . Niacin Itching and Other (See Comments)    Other reaction(s): Flushing (ALLERGY/intolerance)  . Penicillin G Rash    Social History:  The patient  reports that he has never smoked. He has never used smokeless tobacco. He reports that he does not drink alcohol or use drugs.   ROS:  Please see the history of present illness.    Denies any chest discomfort, no significant increase in shortness of breath, positive for isolated syncopal episode as described above, no rashes, no fevers, no cough.    PHYSICAL EXAM: VS:  BP 130/70   Pulse 68   Ht 5\' 11"  (1.803 m)   Wt 254 lb 6.4 oz (115.4 kg)   BMI 35.48 kg/m  Well nourished, well developed, in no acute distress  HEENT: normal right neck bruit.  Neck: no JVD,   Cardiac:  normal S1, S2; RRR; soft systolic murmur Right upper sternal border, distant heart sounds due to to body habitus Lungs:  clear to auscultation bilaterally, no wheezing, rhonchi or rales  Abd: soft, nontender, no hepatomegaly Morbid obesity improved Ext: no edema Chronic venous stasis changes however edema is much improved Skin: warm and dry  Neuro: no focal abnormalities noted  EKG:     EKG is done today. 07/02/15 shows sinus rhythm, first-degree AV block, PR interval 226 ms, right bundle branch block , no other ST segment changes. No significant change from prior-04/14/14-normal sinus rhythm, 66, right bundle branch block.  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 Carotid bruit-right side-mild carotid plaque bilaterally. No need for follow-up. 2. Atypical chest pain-stress test on 07/06/15 normal  reassuring, unfortunately fell and broke his patella. Healed. 3. Weight loss-significant weight loss post gastric surgery. RBBB no change. 4. Morbid obesity- 268 from 408. Wonderful.  No symptoms. Doing well. Continue to encourage exercise. Decrease aspirin to 81 mg. 5. Hypertension-currently controlled. In fact, some of his blood pressure agents have been discontinued. Excellent. No longer on carvedilol for instance. Watching with kidney disease. Does not take Advil. Only Tylenol.  6. Hyperlipidemia- Crestor but he does not know which formulation of statin he is taking. 7.  Diabetes-per primary team. No longer on insulin. Diabetes has improved drastically. 8. Obstructive sleep apnea-encourage use of CPAP. 9. Chronic kidney disease stage III-he is seeing nephrology. Last creatinine was in the 2.5 to 3.1 range. 10. Venous insufficiency-reviewed Dr. Bosie Helper note ablation of saphenous vein discussed. This is markedly improved since weight loss. 11. OK to see back on as-needed basis at this point given his excellent weight loss, recently reassuring stress test, decrease use of antihypertensives.  Signed, Donato Schultz, MD [facility]  07/22/2016 2:47 PM

## 2016-07-22 NOTE — Patient Instructions (Signed)
Medication Instructions:  The current medical regimen is effective;  continue present plan and medications.  Follow-Up: Follow up as needed with Dr Skains.  Thank you for choosing [facility]!!     

## 2016-07-24 ENCOUNTER — Other Ambulatory Visit: Payer: Self-pay | Admitting: Family Medicine

## 2016-07-24 DIAGNOSIS — R945 Abnormal results of liver function studies: Principal | ICD-10-CM

## 2016-07-24 DIAGNOSIS — R7989 Other specified abnormal findings of blood chemistry: Secondary | ICD-10-CM

## 2016-07-24 DIAGNOSIS — Z87898 Personal history of other specified conditions: Secondary | ICD-10-CM

## 2016-07-30 ENCOUNTER — Ambulatory Visit
Admission: RE | Admit: 2016-07-30 | Discharge: 2016-07-30 | Disposition: A | Discharge status: Home / Self Care | Payer: Medicaid Other | Source: Ambulatory Visit | Attending: Family Medicine | Admitting: Family Medicine

## 2016-07-30 DIAGNOSIS — R945 Abnormal results of liver function studies: Principal | ICD-10-CM

## 2016-07-30 DIAGNOSIS — R7989 Other specified abnormal findings of blood chemistry: Secondary | ICD-10-CM

## 2016-07-30 DIAGNOSIS — Z87898 Personal history of other specified conditions: Secondary | ICD-10-CM

## 2016-07-31 ENCOUNTER — Other Ambulatory Visit: Payer: Self-pay | Admitting: *Deleted

## 2016-07-31 MED ORDER — LEVOFLOXACIN 250 MG PO TABS: 250.0000 mg | ORAL_TABLET | Freq: Every day | ORAL | 0 refills | Status: DC

## 2016-08-07 ENCOUNTER — Other Ambulatory Visit: Payer: Medicaid Other

## 2016-08-07 ENCOUNTER — Other Ambulatory Visit: Payer: Self-pay | Admitting: Family Medicine

## 2016-08-07 DIAGNOSIS — D649 Anemia, unspecified: Secondary | ICD-10-CM

## 2016-08-07 DIAGNOSIS — R7989 Other specified abnormal findings of blood chemistry: Secondary | ICD-10-CM

## 2016-08-07 DIAGNOSIS — N289 Disorder of kidney and ureter, unspecified: Secondary | ICD-10-CM

## 2016-08-07 DIAGNOSIS — R945 Abnormal results of liver function studies: Principal | ICD-10-CM

## 2016-08-07 LAB — COMPLETE METABOLIC PANEL WITH GFR
ALBUMIN: 3.4 g/dL — AB (ref 3.6–5.1)
ALK PHOS: 79 U/L (ref 40–115)
ALT: 66 U/L — ABNORMAL HIGH (ref 9–46)
AST: 54 U/L — AB (ref 10–35)
BUN: 73 mg/dL — AB (ref 7–25)
CHLORIDE: 110 mmol/L (ref 98–110)
CO2: 20 mmol/L (ref 20–31)
Calcium: 9.2 mg/dL (ref 8.6–10.3)
Creat: 2.85 mg/dL — ABNORMAL HIGH (ref 0.70–1.25)
GFR, Est African American: 27 mL/min — ABNORMAL LOW (ref >=60)
GFR, Est Non African American: 23 mL/min — ABNORMAL LOW (ref >=60)
GLUCOSE: 116 mg/dL — AB (ref 70–99)
POTASSIUM: 5.2 mmol/L (ref 3.5–5.3)
Sodium: 142 mmol/L (ref 135–146)
Total Bilirubin: 0.3 mg/dL (ref 0.2–1.2)
Total Protein: 6.3 g/dL (ref 6.1–8.1)

## 2016-08-07 LAB — CBC WITH DIFFERENTIAL/PLATELET
BASOS ABS: 0 {cells}/uL (ref 0–200)
Basophils Relative: 0 %
EOS ABS: 130 {cells}/uL (ref 15–500)
Eosinophils Relative: 2 %
HEMATOCRIT: 31.3 % — AB (ref 38.5–50.0)
HEMOGLOBIN: 9.9 g/dL — AB (ref 13.0–17.0)
LYMPHS ABS: 2470 {cells}/uL (ref 850–3900)
Lymphocytes Relative: 38 %
MCH: 27.3 pg (ref 27.0–33.0)
MCHC: 31.6 g/dL — AB (ref 32.0–36.0)
MCV: 86.2 fL (ref 80.0–100.0)
MONO ABS: 390 {cells}/uL (ref 200–950)
MPV: 10.4 fL (ref 7.5–12.5)
Monocytes Relative: 6 %
NEUTROS PCT: 54 %
Neutro Abs: 3510 cells/uL (ref 1500–7800)
Platelets: 156 10*3/uL (ref 140–400)
RBC: 3.63 MIL/uL — ABNORMAL LOW (ref 4.20–5.80)
RDW: 15.3 % — ABNORMAL HIGH (ref 11.0–15.0)
WBC: 6.5 10*3/uL (ref 3.8–10.8)

## 2016-08-08 ENCOUNTER — Encounter: Payer: Self-pay | Admitting: Family Medicine

## 2016-08-08 ENCOUNTER — Ambulatory Visit (INDEPENDENT_AMBULATORY_CARE_PROVIDER_SITE_OTHER): Payer: Medicaid Other | Admitting: Family Medicine

## 2016-08-08 VITALS — BP 130/68 | HR 70 | Temp 98.1°F | Resp 18 | Ht 68.0 in | Wt 254.0 lb

## 2016-08-08 DIAGNOSIS — R945 Abnormal results of liver function studies: Principal | ICD-10-CM

## 2016-08-08 DIAGNOSIS — R7989 Other specified abnormal findings of blood chemistry: Secondary | ICD-10-CM | POA: Diagnosis not present

## 2016-08-08 NOTE — Progress Notes (Signed)
Subjective:    Patient ID: Alexander Hubbard, male    DOB: 01/13/1956, 61 y.o.   MRN: [id number]  Dizziness     04/2016 Was seen in emergency room on November 1. Stood up from his recliner quickly and started to walk. Became lightheaded and passed out. Struck his head sustaining a superficial laceration to his crown. Bruised his left ribs posteriorly. Emergency room evaluation was significant for an elevation in his creatinine from 1.9 which is the baseline to 2.7. Hemoglobin was also low at 9.0. This time last year he was 10.8. He is taking his B12 as well as his iron tablets. He denies any blood loss. Chronic kidney disease does seem to be worsening. At that time, my plan was: Patient lost consciousness due to a drop in his blood pressure upon standing. Lab work was concerning for worsening renal function as a sign of dehydration and also worsening anemia. Certainly has a reason to be anemic given his chronic kidney disease. However I feel we need to rule out GI blood loss. Therefore 1 have the patient discontinue his iron for 48 hours and then perform fecal occult blood cards 3. I will also recheck his renal function and his hemoglobin to ensure that they are not deteriorating. May need to consider discontinuing antihypertensive medication if he continues to have orthostatic dizziness.  07/18/16 I recheck the patient's hemoglobin in January and found to be 8.5. His iron level is extremely low as was his percent saturation. The patient's been taking an iron tablet once a day. However today he presents complaining of orthostatic dizziness. Orthostatic vitals were significant only for a 10 point drop in his systolic blood pressure upon standing. However his heart rate remained stable in the 60s. A fingerstick hemoglobin today was greater than 11. Therefore this makes me concerned that the patient could be dehydrated in addition to his anemia.  At that time, my plan was: Patient's anemia is much better than  I expected. I asked him to continue taking iron however his hemoglobin does not appear to be the source of his orthostatic dizziness. I will have the patient temporarily discontinue his Lasix and his carvedilol and check him again next week to see if his symptoms improved  07/22/16 Lab work revealed an increase in his creatinine which I attributed to dehydration. It also revealed an elevation in his liver function tests is mild but I think it also reflect dehydration. Therefore I had the patient discontinue his carvedilol and his furosemide and recheck today. His blood pressure has improved and the patient feels much better. He called his nephrologist and they agreed with this plan. From a symptom standpoint the patient feels back to normal. However I do want to recheck his liver function tests and his renal function.  At that time, my plan was: I believe his symptoms last week were due to dehydration on top of chronic anemia. Clinically the patient feels better since discontinuing his fluid pill and discontinuing the carvedilol. His blood pressure does not warrant resumption of the carvedilol. Therefore we will recheck a CMP today. If his renal function test has improved and his liver function test has improved no further workup is necessary. If his liver function test are persistently elevated given his recent postoperative complications with his cholecystectomy, I will proceed with imaging of the abdomen.  08/08/16 Liver function tests were elevated. Therefore proceed with a CT scan of the abdomen. There was no residual abscess or fluid collection around the  liver that there was a coincidental finding of right-sided pneumonia which the patient was treated for with Levaquin. I then recheck his lab work yesterday to follow-up his renal function along with his elevated liver function test and they're persistently elevated albeit slightly.   Lab on 08/07/2016  Component Date Value Ref Range Status  . Sodium  08/07/2016 142  135 - 146 mmol/L Final  . Potassium 08/07/2016 5.2  3.5 - 5.3 mmol/L Final  . Chloride 08/07/2016 110  98 - 110 mmol/L Final  . CO2 08/07/2016 20  20 - 31 mmol/L Final  . Glucose, Bld 08/07/2016 116* 70 - 99 mg/dL Final  . BUN 08/07/2016 73* 7 - 25 mg/dL Final  . Creat 08/07/2016 2.85* 0.70 - 1.25 mg/dL Final   Comment:   For patients > or = 61 years of age: The upper reference limit for Creatinine is approximately 13% higher for people identified as African-American.     . Total Bilirubin 08/07/2016 0.3  0.2 - 1.2 mg/dL Final  . Alkaline Phosphatase 08/07/2016 79  40 - 115 U/L Final  . AST 08/07/2016 54* 10 - 35 U/L Final  . ALT 08/07/2016 66* 9 - 46 U/L Final  . Total Protein 08/07/2016 6.3  6.1 - 8.1 g/dL Final  . Albumin 08/07/2016 3.4* 3.6 - 5.1 g/dL Final  . Calcium 08/07/2016 9.2  8.6 - 10.3 mg/dL Final  . GFR, Est African American 08/07/2016 27* >=60 mL/min Final  . GFR, Est Non African American 08/07/2016 23* >=60 mL/min Final  . WBC 08/07/2016 6.5  3.8 - 10.8 K/uL Final  . RBC 08/07/2016 3.63* 4.20 - 5.80 MIL/uL Final  . Hemoglobin 08/07/2016 9.9* 13.0 - 17.0 g/dL Final  . HCT 08/07/2016 31.3* 38.5 - 50.0 % Final  . MCV 08/07/2016 86.2  80.0 - 100.0 fL Final  . MCH 08/07/2016 27.3  27.0 - 33.0 pg Final  . MCHC 08/07/2016 31.6* 32.0 - 36.0 g/dL Final  . RDW 08/07/2016 15.3* 11.0 - 15.0 % Final  . Platelets 08/07/2016 156  140 - 400 K/uL Final  . MPV 08/07/2016 10.4  7.5 - 12.5 fL Final  . Neutro Abs 08/07/2016 3510  1,500 - 7,800 cells/uL Final  . Lymphs Abs 08/07/2016 2470  850 - 3,900 cells/uL Final  . Monocytes Absolute 08/07/2016 390  200 - 950 cells/uL Final  . Eosinophils Absolute 08/07/2016 130  15 - 500 cells/uL Final  . Basophils Absolute 08/07/2016 0  0 - 200 cells/uL Final  . Neutrophils Relative % 08/07/2016 54  % Final  . Lymphocytes Relative 08/07/2016 38  % Final  . Monocytes Relative 08/07/2016 6  % Final  . Eosinophils Relative  08/07/2016 2  % Final  . Basophils Relative 08/07/2016 0  % Final  . Smear Review 08/07/2016 Criteria for review not met   Final    Past Medical History:  Diagnosis Date  . AKI (acute kidney injury) ([place]) 07/2015  . CHF (congestive heart failure) ([place])   . Diabetes mellitus   . Glaucoma (increased eye pressure)   . Gout attack 07/2015   RIGHT HAND LEFT SHOULDER RIGHT KNEE DOWN   . Gouty arthritis   . Hypertension   . Neuropathy of both feet   . Obesity   . Sleep apnea   . UTI (urinary tract infection) 07/2015  . Varicose veins    Past Surgical History:  Procedure Laterality Date  . CHOLECYSTECTOMY, LAPAROSCOPIC     [id number]/[id number] at [facility] complicated by post  op abdominal abscess-E Faecium  . EYE SURGERY    . GASTRIC BYPASS    . KNEE SURGERY     Current Outpatient Prescriptions on File Prior to Visit  Medication Sig Dispense Refill  . aspirin 81 MG tablet Take 81 mg by mouth daily.    . brinzolamide (AZOPT) 1 % ophthalmic suspension Place 1 drop into the left eye 2 (two) times daily.     . Calcium Citrate-Vitamin D (CALCIUM + D PO) Take 1 tablet by mouth 2 (two) times daily.     . colchicine 0.6 MG tablet Take 0.6 mg by mouth daily.    Marland Kitchen docusate sodium (COLACE) 100 MG capsule Take 100 mg by mouth 2 (two) times daily.    . ferrous sulfate 325 (65 FE) MG tablet Take 325 mg by mouth daily with breakfast.    . furosemide (LASIX) 40 MG tablet Take 40 mg by mouth daily as needed for fluid or edema.   2  . gabapentin (NEURONTIN) 300 MG capsule TAKE 2 CAPSULES BY MOUTH 3 TIMES A DAY 180 capsule 3  . glucose blood (ACCU-CHEK SMARTVIEW) test strip 1 each by Other route See admin instructions. Check blood sugar 3 times daily.    Marland Kitchen latanoprost (XALATAN) 0.005 % ophthalmic solution Place 1 drop into the left eye at bedtime.     Marland Kitchen linaclotide (LINZESS) 145 MCG CAPS capsule Take 145 mcg by mouth daily before breakfast.    . Multiple Vitamins-Minerals (MULTIVITAMIN & MINERAL PO) Take 1 tablet by  mouth 2 (two) times daily.     . pantoprazole (PROTONIX) 40 MG tablet TAKE 1 TABLET TWICE A DAY 60 tablet 8  . pentoxifylline (TRENTAL) 400 MG CR tablet Take 400 mg by mouth 3 (three) times daily with meals.    . rosuvastatin (CRESTOR) 20 MG tablet TAKE 1 TABLET BY MOUTH DAILY 30 tablet 3  . sodium bicarbonate 650 MG tablet Take 650 mg by mouth 3 (three) times daily.     . timolol (TIMOPTIC) 0.5 % ophthalmic solution Place 1 drop into both eyes 2 (two) times daily.    Marland Kitchen ULORIC 40 MG tablet Take 1 tablet (40 mg total) by mouth daily. 30 tablet 5  . UNABLE TO FIND Dispense one pair of Diabetic Shoes. ICD E11.49, G62.9, B35.1, R60.9 1 each 0  . valsartan (DIOVAN) 40 MG tablet Take 20 mg by mouth daily.     . vitamin B-12 (CYANOCOBALAMIN) 1000 MCG tablet Take 1,000 mcg by mouth every other day. (Chewable tablet)     No current facility-administered medications on file prior to visit.    Allergies  Allergen Reactions  . Penicillins Swelling and Rash  . Amoxicillin Swelling and Rash  . Niacin Itching and Other (See Comments)    Other reaction(s): Flushing (ALLERGY/intolerance)  . Penicillin G Rash   Social History   Social History  . Marital status: Married    Spouse name: N/A  . Number of children: N/A  . Years of education: N/A   Occupational History  . Not on file.   Social History Main Topics  . Smoking status: Never Smoker  . Smokeless tobacco: Never Used  . Alcohol use No  . Drug use: No  . Sexual activity: Not on file   Other Topics Concern  . Not on file   Social History Narrative  . No narrative on file      Review of Systems  Neurological: Positive for dizziness.  All other systems reviewed and  are negative.      Objective:   Physical Exam  Constitutional: He appears well-developed and well-nourished.  Cardiovascular: Normal rate, regular rhythm and normal heart sounds.   Pulmonary/Chest: Effort normal and breath sounds normal. No respiratory distress.  He has no wheezes. He has no rales.  Abdominal: Soft. Bowel sounds are normal.  Musculoskeletal: He exhibits edema.  Vitals reviewed.         Assessment & Plan:  Elevated liver function test. I've asked the patient to temporarily discontinue Crestor and then recheck liver function test in 2-3 weeks. I suspect after the workup is had thus far that is the medication that is irritating his liver is no evidence of residual abscess, there is no liver mass, and after treating the pneumonia that they found, the liver test did not improve

## 2016-08-11 ENCOUNTER — Ambulatory Visit (INDEPENDENT_AMBULATORY_CARE_PROVIDER_SITE_OTHER): Payer: Self-pay | Admitting: Podiatry

## 2016-08-11 DIAGNOSIS — E1149 Type 2 diabetes mellitus with other diabetic neurological complication: Secondary | ICD-10-CM

## 2016-08-12 NOTE — Progress Notes (Signed)
Patient presents to the office today to be measured for diabetic shoes. He was seen by Ria Clockick Puckett.

## 2016-08-27 ENCOUNTER — Other Ambulatory Visit: Payer: Self-pay | Admitting: Family Medicine

## 2016-08-27 DIAGNOSIS — R945 Abnormal results of liver function studies: Principal | ICD-10-CM

## 2016-08-27 DIAGNOSIS — R7989 Other specified abnormal findings of blood chemistry: Secondary | ICD-10-CM

## 2016-08-28 ENCOUNTER — Telehealth: Payer: Self-pay | Admitting: *Deleted

## 2016-08-28 ENCOUNTER — Ambulatory Visit
Admission: RE | Admit: 2016-08-28 | Discharge: 2016-08-28 | Disposition: A | Discharge status: Home / Self Care | Payer: Medicaid Other | Source: Ambulatory Visit | Attending: Family Medicine | Admitting: Family Medicine

## 2016-08-28 ENCOUNTER — Other Ambulatory Visit: Payer: Medicaid Other

## 2016-08-28 DIAGNOSIS — R945 Abnormal results of liver function studies: Secondary | ICD-10-CM

## 2016-08-28 DIAGNOSIS — R7989 Other specified abnormal findings of blood chemistry: Secondary | ICD-10-CM

## 2016-08-28 DIAGNOSIS — N289 Disorder of kidney and ureter, unspecified: Secondary | ICD-10-CM

## 2016-08-28 DIAGNOSIS — D649 Anemia, unspecified: Secondary | ICD-10-CM

## 2016-08-28 DIAGNOSIS — Z8701 Personal history of pneumonia (recurrent): Secondary | ICD-10-CM

## 2016-08-28 NOTE — Telephone Encounter (Signed)
-----   Message from Durwin Norahristina H Caitlinn Klinker, LPN sent at 1/6/10963/06/2016  9:09 AM EST ----- Regarding: CXR Order CXR to F/U PNA

## 2016-08-28 NOTE — Telephone Encounter (Signed)
Order placed.  Patient aware.  

## 2016-08-29 LAB — BASIC METABOLIC PANEL
BUN: 54 mg/dL — ABNORMAL HIGH (ref 7–25)
CALCIUM: 8.9 mg/dL (ref 8.6–10.3)
CHLORIDE: 111 mmol/L — AB (ref 98–110)
CO2: 17 mmol/L — ABNORMAL LOW (ref 20–31)
CREATININE: 2.45 mg/dL — AB (ref 0.70–1.25)
Glucose, Bld: 101 mg/dL — ABNORMAL HIGH (ref 70–99)
Potassium: 4.5 mmol/L (ref 3.5–5.3)
Sodium: 143 mmol/L (ref 135–146)

## 2016-08-29 LAB — CBC WITH DIFFERENTIAL/PLATELET
BASOS PCT: 0 %
Basophils Absolute: 0 cells/uL (ref 0–200)
EOS PCT: 2 %
Eosinophils Absolute: 160 cells/uL (ref 15–500)
HCT: 33.4 % — ABNORMAL LOW (ref 38.5–50.0)
Hemoglobin: 10.6 g/dL — ABNORMAL LOW (ref 13.0–17.0)
LYMPHS PCT: 31 %
Lymphs Abs: 2480 cells/uL (ref 850–3900)
MCH: 27.7 pg (ref 27.0–33.0)
MCHC: 31.7 g/dL — ABNORMAL LOW (ref 32.0–36.0)
MCV: 87.4 fL (ref 80.0–100.0)
MONOS PCT: 5 %
MPV: 10.7 fL (ref 7.5–12.5)
Monocytes Absolute: 400 cells/uL (ref 200–950)
NEUTROS ABS: 4960 {cells}/uL (ref 1500–7800)
Neutrophils Relative %: 62 %
Platelets: 172 10*3/uL (ref 140–400)
RBC: 3.82 MIL/uL — AB (ref 4.20–5.80)
RDW: 14.8 % (ref 11.0–15.0)
WBC: 8 10*3/uL (ref 3.8–10.8)

## 2016-08-29 LAB — HEPATIC FUNCTION PANEL
ALBUMIN: 3.2 g/dL — AB (ref 3.6–5.1)
ALK PHOS: 78 U/L (ref 40–115)
ALT: 21 U/L (ref 9–46)
AST: 20 U/L (ref 10–35)
Bilirubin, Direct: 0.1 mg/dL (ref <=0.2)
Indirect Bilirubin: 0.3 mg/dL (ref 0.2–1.2)
TOTAL PROTEIN: 6.3 g/dL (ref 6.1–8.1)
Total Bilirubin: 0.4 mg/dL (ref 0.2–1.2)

## 2016-09-01 ENCOUNTER — Other Ambulatory Visit: Payer: Medicaid Other

## 2016-09-01 ENCOUNTER — Encounter: Payer: Self-pay | Admitting: Family Medicine

## 2016-09-01 ENCOUNTER — Ambulatory Visit (INDEPENDENT_AMBULATORY_CARE_PROVIDER_SITE_OTHER): Payer: Medicaid Other | Admitting: Family Medicine

## 2016-09-01 ENCOUNTER — Telehealth: Payer: Self-pay | Admitting: Podiatry

## 2016-09-01 VITALS — BP 160/98 | HR 68 | Temp 97.9°F | Resp 18 | Ht 68.0 in | Wt 256.0 lb

## 2016-09-01 DIAGNOSIS — L97521 Non-pressure chronic ulcer of other part of left foot limited to breakdown of skin: Secondary | ICD-10-CM | POA: Diagnosis not present

## 2016-09-01 DIAGNOSIS — R945 Abnormal results of liver function studies: Principal | ICD-10-CM

## 2016-09-01 DIAGNOSIS — R7989 Other specified abnormal findings of blood chemistry: Secondary | ICD-10-CM

## 2016-09-01 MED ORDER — PANTOPRAZOLE SODIUM 40 MG PO TBEC: 40.0000 mg | DELAYED_RELEASE_TABLET | Freq: Two times a day (BID) | ORAL | 0 refills | Status: DC

## 2016-09-01 MED ORDER — ROSUVASTATIN CALCIUM 5 MG PO TABS: 5.0000 mg | ORAL_TABLET | Freq: Every day | ORAL | 3 refills | Status: DC

## 2016-09-01 MED ORDER — ROSUVASTATIN CALCIUM 10 MG PO TABS: 10.0000 mg | ORAL_TABLET | Freq: Every day | ORAL | 2 refills | Status: DC

## 2016-09-01 NOTE — Telephone Encounter (Signed)
Pt called checking on status of diabetic shoes..  

## 2016-09-01 NOTE — Progress Notes (Signed)
Subjective:    Patient ID: Alexander Hubbard, male    DOB: 01/13/1956, 61 y.o.   MRN: [id number]  Dizziness     04/2016 Was seen in emergency room on November 1. Stood up from his recliner quickly and started to walk. Became lightheaded and passed out. Struck his head sustaining a superficial laceration to his crown. Bruised his left ribs posteriorly. Emergency room evaluation was significant for an elevation in his creatinine from 1.9 which is the baseline to 2.7. Hemoglobin was also low at 9.0. This time last year he was 10.8. He is taking his B12 as well as his iron tablets. He denies any blood loss. Chronic kidney disease does seem to be worsening. At that time, my plan was: Patient lost consciousness due to a drop in his blood pressure upon standing. Lab work was concerning for worsening renal function as a sign of dehydration and also worsening anemia. Certainly has a reason to be anemic given his chronic kidney disease. However I feel we need to rule out GI blood loss. Therefore 1 have the patient discontinue his iron for 48 hours and then perform fecal occult blood cards 3. I will also recheck his renal function and his hemoglobin to ensure that they are not deteriorating. May need to consider discontinuing antihypertensive medication if he continues to have orthostatic dizziness.  07/18/16 I recheck the patient's hemoglobin in January and found to be 8.5. His iron level is extremely low as was his percent saturation. The patient's been taking an iron tablet once a day. However today he presents complaining of orthostatic dizziness. Orthostatic vitals were significant only for a 10 point drop in his systolic blood pressure upon standing. However his heart rate remained stable in the 60s. A fingerstick hemoglobin today was greater than 11. Therefore this makes me concerned that the patient could be dehydrated in addition to his anemia.  At that time, my plan was: Patient's anemia is much better than  I expected. I asked him to continue taking iron however his hemoglobin does not appear to be the source of his orthostatic dizziness. I will have the patient temporarily discontinue his Lasix and his carvedilol and check him again next week to see if his symptoms improved  07/22/16 Lab work revealed an increase in his creatinine which I attributed to dehydration. It also revealed an elevation in his liver function tests is mild but I think it also reflect dehydration. Therefore I had the patient discontinue his carvedilol and his furosemide and recheck today. His blood pressure has improved and the patient feels much better. He called his nephrologist and they agreed with this plan. From a symptom standpoint the patient feels back to normal. However I do want to recheck his liver function tests and his renal function.  At that time, my plan was: I believe his symptoms last week were due to dehydration on top of chronic anemia. Clinically the patient feels better since discontinuing his fluid pill and discontinuing the carvedilol. His blood pressure does not warrant resumption of the carvedilol. Therefore we will recheck a CMP today. If his renal function test has improved and his liver function test has improved no further workup is necessary. If his liver function test are persistently elevated given his recent postoperative complications with his cholecystectomy, I will proceed with imaging of the abdomen.  08/08/16 Liver function tests were elevated. Therefore proceed with a CT scan of the abdomen. There was no residual abscess or fluid collection around the  liver that there was a coincidental finding of right-sided pneumonia which the patient was treated for with Levaquin. I then recheck his lab work yesterday to follow-up his renal function along with his elevated liver function test and they're persistently elevated albeit slightly.  At that time, my plan was: I've asked the patient to temporarily  discontinue Crestor and then recheck liver function test in 2-3 weeks. I suspect after the workup is had thus far that is the medication that is irritating his liver is no evidence of residual abscess, there is no liver mass, and after treating the pneumonia that they found, the liver test did not improve  09/01/16 Patient is here for recheck.  After stopping crestor, most recent LFT's are below and have normalized.  However one week ago, the patient suffered lacerations on his left leg while moving a couch. On his left shin my he has 2 very superficial skin tears that are approximately the diameter of a dime. There is no erythema. He has chronic venous stasis changes on his left shin. There is no evidence of cellulitis. There is no weeping edema. Wife has been dressing them with Neosporin and a Band-Aid and they look like they're healing well. However on the medial aspect of the left first MTP joint, there is a pronounced ulcer that is 1.2 cm x 1.7 cm. There is no erythema. There is no purulent drainage. I cannot see the underlying bone. However this is a very concerning area for a diabetic have an ulcer  Lab on 08/28/2016  Component Date Value Ref Range Status  . Total Bilirubin 08/28/2016 0.4  0.2 - 1.2 mg/dL Final  . Bilirubin, Direct 08/28/2016 0.1  <=0.2 mg/dL Final  . Indirect Bilirubin 08/28/2016 0.3  0.2 - 1.2 mg/dL Final  . Alkaline Phosphatase 08/28/2016 78  40 - 115 U/L Final  . AST 08/28/2016 20  10 - 35 U/L Final  . ALT 08/28/2016 21  9 - 46 U/L Final  . Total Protein 08/28/2016 6.3  6.1 - 8.1 g/dL Final  . Albumin 08/28/2016 3.2* 3.6 - 5.1 g/dL Final  . Sodium 08/28/2016 143  135 - 146 mmol/L Final  . Potassium 08/28/2016 4.5  3.5 - 5.3 mmol/L Final  . Chloride 08/28/2016 111* 98 - 110 mmol/L Final  . CO2 08/28/2016 17* 20 - 31 mmol/L Final  . Glucose, Bld 08/28/2016 101* 70 - 99 mg/dL Final  . BUN 08/28/2016 54* 7 - 25 mg/dL Final  . Creat 08/28/2016 2.45* 0.70 - 1.25 mg/dL Final     Comment:   For patients > or = 61 years of age: The upper reference limit for Creatinine is approximately 13% higher for people identified as African-American.     . Calcium 08/28/2016 8.9  8.6 - 10.3 mg/dL Final  . WBC 08/28/2016 8.0  3.8 - 10.8 K/uL Final  . RBC 08/28/2016 3.82* 4.20 - 5.80 MIL/uL Final  . Hemoglobin 08/28/2016 10.6* 13.0 - 17.0 g/dL Final  . HCT 08/28/2016 33.4* 38.5 - 50.0 % Final  . MCV 08/28/2016 87.4  80.0 - 100.0 fL Final  . MCH 08/28/2016 27.7  27.0 - 33.0 pg Final  . MCHC 08/28/2016 31.7* 32.0 - 36.0 g/dL Final  . RDW 08/28/2016 14.8  11.0 - 15.0 % Final  . Platelets 08/28/2016 172  140 - 400 K/uL Final  . MPV 08/28/2016 10.7  7.5 - 12.5 fL Final  . Neutro Abs 08/28/2016 4960  1,500 - 7,800 cells/uL Final  . Lymphs  Abs 08/28/2016 2480  850 - 3,900 cells/uL Final  . Monocytes Absolute 08/28/2016 400  200 - 950 cells/uL Final  . Eosinophils Absolute 08/28/2016 160  15 - 500 cells/uL Final  . Basophils Absolute 08/28/2016 0  0 - 200 cells/uL Final  . Neutrophils Relative % 08/28/2016 62  % Final  . Lymphocytes Relative 08/28/2016 31  % Final  . Monocytes Relative 08/28/2016 5  % Final  . Eosinophils Relative 08/28/2016 2  % Final  . Basophils Relative 08/28/2016 0  % Final  . Smear Review 08/28/2016 Criteria for review not met   Final    Past Medical History:  Diagnosis Date  . AKI (acute kidney injury) ([place]) 07/2015  . CHF (congestive heart failure) ([place])   . Diabetes mellitus   . Glaucoma (increased eye pressure)   . Gout attack 07/2015   RIGHT HAND LEFT SHOULDER RIGHT KNEE DOWN   . Gouty arthritis   . Hypertension   . Neuropathy of both feet   . Obesity   . Sleep apnea   . UTI (urinary tract infection) 07/2015  . Varicose veins    Past Surgical History:  Procedure Laterality Date  . CHOLECYSTECTOMY, LAPAROSCOPIC     04/314 at [facility] complicated by post op abdominal abscess-E Faecium  . EYE SURGERY    . GASTRIC BYPASS    . KNEE SURGERY      Current Outpatient Prescriptions on File Prior to Visit  Medication Sig Dispense Refill  . aspirin 81 MG tablet Take 81 mg by mouth daily.    . brinzolamide (AZOPT) 1 % ophthalmic suspension Place 1 drop into the left eye 2 (two) times daily.     . Calcium Citrate-Vitamin D (CALCIUM + D PO) Take 1 tablet by mouth 2 (two) times daily.     . colchicine 0.6 MG tablet Take 0.6 mg by mouth daily.    Marland Kitchen docusate sodium (COLACE) 100 MG capsule Take 100 mg by mouth 2 (two) times daily.    . ferrous sulfate 325 (65 FE) MG tablet Take 325 mg by mouth daily with breakfast.    . furosemide (LASIX) 40 MG tablet Take 40 mg by mouth daily as needed for fluid or edema.   2  . gabapentin (NEURONTIN) 300 MG capsule TAKE 2 CAPSULES BY MOUTH 3 TIMES A DAY 180 capsule 3  . glucose blood (ACCU-CHEK SMARTVIEW) test strip 1 each by Other route See admin instructions. Check blood sugar 3 times daily.    Marland Kitchen latanoprost (XALATAN) 0.005 % ophthalmic solution Place 1 drop into the left eye at bedtime.     Marland Kitchen linaclotide (LINZESS) 145 MCG CAPS capsule Take 145 mcg by mouth daily before breakfast.    . Multiple Vitamins-Minerals (MULTIVITAMIN & MINERAL PO) Take 1 tablet by mouth 2 (two) times daily.     . pantoprazole (PROTONIX) 40 MG tablet TAKE 1 TABLET TWICE A DAY 60 tablet 8  . pentoxifylline (TRENTAL) 400 MG CR tablet Take 400 mg by mouth 3 (three) times daily with meals.    . rosuvastatin (CRESTOR) 20 MG tablet TAKE 1 TABLET BY MOUTH DAILY 30 tablet 3  . sodium bicarbonate 650 MG tablet Take 650 mg by mouth 3 (three) times daily.     . timolol (TIMOPTIC) 0.5 % ophthalmic solution Place 1 drop into both eyes 2 (two) times daily.    Marland Kitchen ULORIC 40 MG tablet Take 1 tablet (40 mg total) by mouth daily. 30 tablet 5  . UNABLE TO  FIND Dispense one pair of Diabetic Shoes. ICD E11.49, G62.9, B35.1, R60.9 1 each 0  . valsartan (DIOVAN) 40 MG tablet Take 20 mg by mouth daily.     . vitamin B-12 (CYANOCOBALAMIN) 1000 MCG tablet  Take 1,000 mcg by mouth every other day. (Chewable tablet)     No current facility-administered medications on file prior to visit.    Allergies  Allergen Reactions  . Penicillins Swelling and Rash  . Amoxicillin Swelling and Rash  . Niacin Itching and Other (See Comments)    Other reaction(s): Flushing (ALLERGY/intolerance)  . Penicillin G Rash   Social History   Social History  . Marital status: Married    Spouse name: N/A  . Number of children: N/A  . Years of education: N/A   Occupational History  . Not on file.   Social History Main Topics  . Smoking status: Never Smoker  . Smokeless tobacco: Never Used  . Alcohol use No  . Drug use: No  . Sexual activity: Not on file   Other Topics Concern  . Not on file   Social History Narrative  . No narrative on file      Review of Systems  Neurological: Positive for dizziness.  All other systems reviewed and are negative.      Objective:   Physical Exam  Constitutional: He appears well-developed and well-nourished.  Cardiovascular: Normal rate, regular rhythm and normal heart sounds.   Pulmonary/Chest: Effort normal and breath sounds normal. No respiratory distress. He has no wheezes. He has no rales.  Abdominal: Soft. Bowel sounds are normal.  Musculoskeletal: He exhibits edema.  Vitals reviewed.         Assessment & Plan:  Elevated LFTs  Ulcerated, foot, left, limited to breakdown of skin ([place])   Most likely due to Crestor. Given the patient's history of diabetes, I would like him to be on a statin. However I do not see a need to put him on high-dose statin at present time. Therefore I'll have him resume Crestor 5 mg a day and recheck a CMP and fasting lipid panel in 3 months.  I am very concerned by the wound on the medial aspect of his left first MTP joint. There is no evidence of cellulitis or osteomyelitis and down his exam. I covered the ulcer with Silvadene, a 2 x 3 cm nonadherent gauze, I then  covered that with 4 x 4's to create a pad and wrapped the foot and Coban. I demonstrated to the wife how to address his foot like this every day. I gave her supplies to address his foot like this. I recommended that he stay off his foot and keep it elevated as much as possible. Recheck in one week or sooner if worse.

## 2016-09-03 ENCOUNTER — Other Ambulatory Visit: Payer: Self-pay | Admitting: Family Medicine

## 2016-09-04 NOTE — Telephone Encounter (Signed)
Refill appropriate 

## 2016-09-08 ENCOUNTER — Ambulatory Visit (INDEPENDENT_AMBULATORY_CARE_PROVIDER_SITE_OTHER): Payer: Medicaid Other | Admitting: Family Medicine

## 2016-09-08 ENCOUNTER — Encounter: Payer: Self-pay | Admitting: Family Medicine

## 2016-09-08 VITALS — BP 142/70 | HR 60 | Temp 97.8°F | Resp 18 | Wt 256.0 lb

## 2016-09-08 DIAGNOSIS — L97521 Non-pressure chronic ulcer of other part of left foot limited to breakdown of skin: Secondary | ICD-10-CM

## 2016-09-08 MED ORDER — DOXYCYCLINE HYCLATE 100 MG PO TABS
100.0000 mg | ORAL_TABLET | Freq: Two times a day (BID) | ORAL | 0 refills | Status: DC
Start: 2016-09-08 — End: 2017-05-15

## 2016-09-08 NOTE — Progress Notes (Signed)
Subjective:    Patient ID: Alexander Hubbard, male    DOB: 01/13/1956, 61 y.o.   MRN: [id number]  Dizziness     04/2016 Was seen in emergency room on November 1. Stood up from his recliner quickly and started to walk. Became lightheaded and passed out. Struck his head sustaining a superficial laceration to his crown. Bruised his left ribs posteriorly. Emergency room evaluation was significant for an elevation in his creatinine from 1.9 which is the baseline to 2.7. Hemoglobin was also low at 9.0. This time last year he was 10.8. He is taking his B12 as well as his iron tablets. He denies any blood loss. Chronic kidney disease does seem to be worsening. At that time, my plan was: Patient lost consciousness due to a drop in his blood pressure upon standing. Lab work was concerning for worsening renal function as a sign of dehydration and also worsening anemia. Certainly has a reason to be anemic given his chronic kidney disease. However I feel we need to rule out GI blood loss. Therefore 1 have the patient discontinue his iron for 48 hours and then perform fecal occult blood cards 3. I will also recheck his renal function and his hemoglobin to ensure that they are not deteriorating. May need to consider discontinuing antihypertensive medication if he continues to have orthostatic dizziness.  07/18/16 I recheck the patient's hemoglobin in January and found to be 8.5. His iron level is extremely low as was his percent saturation. The patient's been taking an iron tablet once a day. However today he presents complaining of orthostatic dizziness. Orthostatic vitals were significant only for a 10 point drop in his systolic blood pressure upon standing. However his heart rate remained stable in the 60s. A fingerstick hemoglobin today was greater than 11. Therefore this makes me concerned that the patient could be dehydrated in addition to his anemia.  At that time, my plan was: Patient's anemia is much better than  I expected. I asked him to continue taking iron however his hemoglobin does not appear to be the source of his orthostatic dizziness. I will have the patient temporarily discontinue his Lasix and his carvedilol and check him again next week to see if his symptoms improved  07/22/16 Lab work revealed an increase in his creatinine which I attributed to dehydration. It also revealed an elevation in his liver function tests is mild but I think it also reflect dehydration. Therefore I had the patient discontinue his carvedilol and his furosemide and recheck today. His blood pressure has improved and the patient feels much better. He called his nephrologist and they agreed with this plan. From a symptom standpoint the patient feels back to normal. However I do want to recheck his liver function tests and his renal function.  At that time, my plan was: I believe his symptoms last week were due to dehydration on top of chronic anemia. Clinically the patient feels better since discontinuing his fluid pill and discontinuing the carvedilol. His blood pressure does not warrant resumption of the carvedilol. Therefore we will recheck a CMP today. If his renal function test has improved and his liver function test has improved no further workup is necessary. If his liver function test are persistently elevated given his recent postoperative complications with his cholecystectomy, I will proceed with imaging of the abdomen.  08/08/16 Liver function tests were elevated. Therefore proceed with a CT scan of the abdomen. There was no residual abscess or fluid collection around the  liver that there was a coincidental finding of right-sided pneumonia which the patient was treated for with Levaquin. I then recheck his lab work yesterday to follow-up his renal function along with his elevated liver function test and they're persistently elevated albeit slightly.  At that time, my plan was: I've asked the patient to temporarily  discontinue Crestor and then recheck liver function test in 2-3 weeks. I suspect after the workup is had thus far that is the medication that is irritating his liver is no evidence of residual abscess, there is no liver mass, and after treating the pneumonia that they found, the liver test did not improve  09/01/16 Patient is here for recheck.  After stopping crestor, most recent LFT's are below and have normalized.  However one week ago, the patient suffered lacerations on his left leg while moving a couch. On his left shin my he has 2 very superficial skin tears that are approximately the diameter of a dime. There is no erythema. He has chronic venous stasis changes on his left shin. There is no evidence of cellulitis. There is no weeping edema. Wife has been dressing them with Neosporin and a Band-Aid and they look like they're healing well. However on the medial aspect of the left first MTP joint, there is a pronounced ulcer that is 1.2 cm x 1.7 cm. There is no erythema. There is no purulent drainage. I cannot see the underlying bone. However this is a very concerning area for a diabetic have an ulcer.  AT that time, my plan was: Most likely due to Crestor. Given the patient's history of diabetes, I would like him to be on a statin. However I do not see a need to put him on high-dose statin at present time. Therefore I'll have him resume Crestor 5 mg a day and recheck a CMP and fasting lipid panel in 3 months.  I am very concerned by the wound on the medial aspect of his left first MTP joint. There is no evidence of cellulitis or osteomyelitis and down his exam. I covered the ulcer with Silvadene, a 2 x 3 cm nonadherent gauze, I then covered that with 4 x 4's to create a pad and wrapped the foot and Coban. I demonstrated to the wife how to address his foot like this every day. I gave her supplies to address his foot like this. I recommended that he stay off his foot and keep it elevated as much as possible.  Recheck in one week or sooner if worse.  09/08/16  Wound on the medial aspect of his left first MTP joint looks much better. There is no evidence of cellulitis. There is no erythema. There is no exudate. Although the ulcer is roughly the same diameter as it was before, is much shallower and is essentially now even with the surface of the surrounding skin which is healed and slowly by secondary intention. I anticipate the current trajectory the wound will be completely healed within 2 weeks. She is to go to [place] on vacation later this week.  Past Medical History:  Diagnosis Date  . AKI (acute kidney injury) ([facility]) 07/2015  . CHF (congestive heart failure) ([facility])   . Diabetes mellitus   . Glaucoma (increased eye pressure)   . Gout attack 07/2015   RIGHT HAND LEFT SHOULDER RIGHT KNEE DOWN   . Gouty arthritis   . Hypertension   . Neuropathy of both feet   . Obesity   . Sleep apnea   .  UTI (urinary tract infection) 07/2015  . Varicose veins    Past Surgical History:  Procedure Laterality Date  . CHOLECYSTECTOMY, LAPAROSCOPIC     05/2016 at [facility] complicated by post op abdominal abscess-E Faecium  . EYE SURGERY    . GASTRIC BYPASS    . KNEE SURGERY     Current Outpatient Prescriptions on File Prior to Visit  Medication Sig Dispense Refill  . aspirin 81 MG tablet Take 81 mg by mouth daily.    . brinzolamide (AZOPT) 1 % ophthalmic suspension Place 1 drop into the left eye 2 (two) times daily.     . Calcium Citrate-Vitamin D (CALCIUM + D PO) Take 1 tablet by mouth 2 (two) times daily.     . colchicine 0.6 MG tablet Take 0.6 mg by mouth daily.    Marland Kitchen docusate sodium (COLACE) 100 MG capsule Take 100 mg by mouth 2 (two) times daily.    . ferrous sulfate 325 (65 FE) MG tablet Take 325 mg by mouth daily with breakfast.    . furosemide (LASIX) 40 MG tablet Take 40 mg by mouth daily as needed for fluid or edema.   2  . gabapentin (NEURONTIN) 300 MG capsule TAKE 2 CAPSULES BY MOUTH 3 TIMES A DAY  180 capsule 3  . glucose blood (ACCU-CHEK SMARTVIEW) test strip 1 each by Other route See admin instructions. Check blood sugar 3 times daily.    Marland Kitchen latanoprost (XALATAN) 0.005 % ophthalmic solution Place 1 drop into the left eye at bedtime.     Marland Kitchen linaclotide (LINZESS) 145 MCG CAPS capsule Take 145 mcg by mouth daily before breakfast.    . Multiple Vitamins-Minerals (MULTIVITAMIN & MINERAL PO) Take 1 tablet by mouth 2 (two) times daily.     . pantoprazole (PROTONIX) 40 MG tablet Take 1 tablet (40 mg total) by mouth 2 (two) times daily. 270 tablet 0  . pentoxifylline (TRENTAL) 400 MG CR tablet Take 400 mg by mouth 3 (three) times daily with meals.    . rosuvastatin (CRESTOR) 5 MG tablet Take 1 tablet (5 mg total) by mouth daily. 90 tablet 3  . sodium bicarbonate 650 MG tablet Take 650 mg by mouth 3 (three) times daily.     . timolol (TIMOPTIC) 0.5 % ophthalmic solution Place 1 drop into both eyes 2 (two) times daily.    Marland Kitchen ULORIC 40 MG tablet Take 1 tablet (40 mg total) by mouth daily. 30 tablet 5  . UNABLE TO FIND Dispense one pair of Diabetic Shoes. ICD E11.49, G62.9, B35.1, R60.9 1 each 0  . valsartan (DIOVAN) 40 MG tablet Take 20 mg by mouth daily.     . vitamin B-12 (CYANOCOBALAMIN) 1000 MCG tablet Take 1,000 mcg by mouth every other day. (Chewable tablet)     No current facility-administered medications on file prior to visit.    Allergies  Allergen Reactions  . Penicillins Swelling and Rash  . Amoxicillin Swelling and Rash  . Niacin Itching and Other (See Comments)    Other reaction(s): Flushing (ALLERGY/intolerance)  . Penicillin G Rash   Social History   Social History  . Marital status: Married    Spouse name: N/A  . Number of children: N/A  . Years of education: N/A   Occupational History  . Not on file.   Social History Main Topics  . Smoking status: Never Smoker  . Smokeless tobacco: Never Used  . Alcohol use No  . Drug use: No  . Sexual activity: Not  on file    Other Topics Concern  . Not on file   Social History Narrative  . No narrative on file      Review of Systems  Neurological: Positive for dizziness.  All other systems reviewed and are negative.      Objective:   Physical Exam  Constitutional: He appears well-developed and well-nourished.  Cardiovascular: Normal rate, regular rhythm and normal heart sounds.   Pulmonary/Chest: Effort normal and breath sounds normal. No respiratory distress. He has no wheezes. He has no rales.  Abdominal: Soft. Bowel sounds are normal.  Musculoskeletal: He exhibits edema.  Vitals reviewed.         Assessment & Plan:  Ulcerated, foot, left, limited to breakdown of skin ([facility])  The ulcer on the medial aspect of his left first MTP joint is much improved. Continue current care. Keep the foot elevated as much as possible. Keep the wound dressed. I believe a simple Band-Aid at this point is sufficient. Try to avoid pressure in that area. I did give the patient prescription for doxycycline to cover for cellulitis. I would indicate a prescription with him to [facility]. He is not to take the medication at the present time. However I want him to have access to it in case the wound becomes infected while on vacation so that he can start the antibiotic immediately

## 2016-09-11 ENCOUNTER — Ambulatory Visit (INDEPENDENT_AMBULATORY_CARE_PROVIDER_SITE_OTHER): Payer: Medicaid Other | Admitting: Podiatry

## 2016-09-11 DIAGNOSIS — B351 Tinea unguium: Secondary | ICD-10-CM | POA: Diagnosis not present

## 2016-09-11 DIAGNOSIS — E1149 Type 2 diabetes mellitus with other diabetic neurological complication: Secondary | ICD-10-CM | POA: Diagnosis not present

## 2016-09-11 DIAGNOSIS — M79676 Pain in unspecified toe(s): Secondary | ICD-10-CM | POA: Diagnosis not present

## 2016-09-11 DIAGNOSIS — I739 Peripheral vascular disease, unspecified: Secondary | ICD-10-CM

## 2016-09-11 DIAGNOSIS — L97521 Non-pressure chronic ulcer of other part of left foot limited to breakdown of skin: Secondary | ICD-10-CM

## 2016-09-12 NOTE — Progress Notes (Signed)
Patient ID: Alexander Hubbard, male   DOB: 01-01-1956, 61 y.o.   MRN: [id number]  Subjective: 61 y.o. returns the office today for painful, elongated, thickened toenails which he is unable to trim herself. Denies any redness or drainage around the nails. I forcefully due to insurance she's having difficulty getting diabetic shoes. Also he states he did bump his left foot against a box and he developed a wound to his left foot. His wife states this is doing much better and she is been cleaning with peroxide and applying antibiotic ointment. She feels it is healing. Denies any drainage or pus or any surrounding redness or red streaks. No other complaints today. Denies any systemic complaints such as fevers, chills, nausea, vomiting.   Objective: AAO 3, NAD DP/PT pulses palpable 1/4, CRT less than 3 seconds Protective sensation decreased with Simms Weinstein monofilament Nails hypertrophic, dystrophic, elongated, brittle, discolored 10. There is tenderness overlying the nails 1-5 bilaterally. There is no surrounding erythema or drainage along the nail sites. On the medial aspect left hallux is a fibro-granular wound measuring 1.5 x 1 70 and superficial. There is no surrounding erythema, ascending cellulitis. There is no drainage or pus. There is no fluctuance, crepitus, malodor. Small superficial abrasions of the anterior leg as well with about any signs of infection. Prominent metatarsal heads b/l with atrophy of the fat pad Hammertoes are present.  No other open lesions or pre-ulcerative lesions are identified bilaterally.  No other areas of tenderness bilateral lower extremities. No overlying edema, erythema, increased warmth. No pain with calf compression, swelling, warmth, erythema.  Assessment: Patient presents with symptomatic onychomycosis; ulceration left foot, anterior leg  Plan: -Treatment options including alternatives, risks, complications were discussed -Nails sharply debrided 10 without  complication/bleeding. -Recommended continued antibiotic ointment dressing changes daily. He is currently out of the country leaving this Sunday for the next 3 months. I discussed with him that if there is any signs or symptoms of infection or any worsening over the area does not heal next 2 weeks that he needs to follow-up with a physician in [place]. -Follow-up in 3 months (as he is going to [place] on Sunday) or sooner if any problems are to arise. In the meantime, encouraged to call the office with any questions, concerns, changes symptoms.  Ovid Curd, DPM

## 2016-10-14 ENCOUNTER — Ambulatory Visit: Payer: Medicaid Other | Admitting: "Endocrinology

## 2016-12-01 ENCOUNTER — Encounter: Payer: Self-pay | Admitting: Physical Therapy

## 2016-12-01 NOTE — Therapy (Signed)
[facility] [address] [place], [place], [address] Phone: [phone]   Fax:  [phone]  Patient Details  Name: Alexander Hubbard MRN: [id number] Date of Birth: [birth year] Referring Provider:  No ref. provider found  Encounter Date: 12/01/2016  PHYSICAL THERAPY DISCHARGE SUMMARY  Visits from Start of Care: 4  Current functional level related to goals / functional outcomes:     PT Long Term Goals - 07/07/16 1256      PT LONG TERM GOAL #1   Title Pt will be independent with HEP for strengthening, balance, and gait.     Baseline Met; 07/06/16   Time 3   Period Weeks   Status Achieved     PT LONG TERM GOAL #2   Title Pt will improve TUG score to less than or equal to 13.5 seconds for decreased fall risk.   Baseline TUG 8.26 sec; Scores >13.5 sec indicate increased fall risk.   Time 3   Period Weeks   Status Achieved     PT LONG TERM GOAL #3   Title Berg Balance score 22/56, with pt to improve score by at least 7 points for decreased fall risk.   Baseline Met, 40/56; 07/07/16.   Time 3   Period Weeks   Status Achieved     PT LONG TERM GOAL #4   Title Pt will improve ABC score by 20% for improved balance confidence.   Baseline ABC score 19.4%   Time 3   Period Weeks   Status Unable to assess  due to time.     PT LONG TERM GOAL #5   Title Pt/family will verbalize understanding of fall prevention in the home environment.   Baseline Met, 07/07/16.   Time 3   Period Weeks   Status Achieved     Pt has met 4 of 5 LTGs and has made good progress with PT.   Remaining deficits: Balance   Education / Equipment: Pt has been educated in HEP and fall prevention.  Plan: Patient agrees to discharge.  Patient goals were partially met. Patient is being discharged due to financial reasons.  ?????       Alexander Hubbard W. 12/01/2016, 8:24 AM  Frazier Butt., PT  [facility] [address] [place], [place], [address] Phone: [phone]   Fax:  [phone]

## 2016-12-11 ENCOUNTER — Ambulatory Visit: Payer: Medicaid Other | Admitting: Podiatry

## 2017-04-26 ENCOUNTER — Other Ambulatory Visit: Payer: Self-pay | Admitting: Family Medicine

## 2017-04-27 ENCOUNTER — Encounter: Payer: Self-pay | Admitting: Podiatry

## 2017-04-27 ENCOUNTER — Ambulatory Visit: Payer: Medicaid Other | Admitting: Podiatry

## 2017-04-27 DIAGNOSIS — E1149 Type 2 diabetes mellitus with other diabetic neurological complication: Secondary | ICD-10-CM

## 2017-04-27 DIAGNOSIS — B351 Tinea unguium: Secondary | ICD-10-CM

## 2017-04-27 DIAGNOSIS — M79676 Pain in unspecified toe(s): Secondary | ICD-10-CM

## 2017-04-27 DIAGNOSIS — L97911 Non-pressure chronic ulcer of unspecified part of right lower leg limited to breakdown of skin: Secondary | ICD-10-CM

## 2017-04-27 DIAGNOSIS — I739 Peripheral vascular disease, unspecified: Secondary | ICD-10-CM

## 2017-04-27 DIAGNOSIS — L97921 Non-pressure chronic ulcer of unspecified part of left lower leg limited to breakdown of skin: Secondary | ICD-10-CM

## 2017-04-27 NOTE — Telephone Encounter (Signed)
Medication refilled per protocol. 

## 2017-04-28 ENCOUNTER — Telehealth: Payer: Self-pay | Admitting: Family Medicine

## 2017-04-28 NOTE — Telephone Encounter (Signed)
Nothing in this note 

## 2017-04-28 NOTE — Telephone Encounter (Deleted)
Pt needs refill on protonix and trental [facility] to [facility]

## 2017-04-29 MED ORDER — PENTOXIFYLLINE ER 400 MG PO TBCR: 400.0000 mg | EXTENDED_RELEASE_TABLET | Freq: Three times a day (TID) | ORAL | 1 refills | Status: DC

## 2017-04-29 MED ORDER — PANTOPRAZOLE SODIUM 40 MG PO TBEC: 40.0000 mg | DELAYED_RELEASE_TABLET | Freq: Two times a day (BID) | ORAL | 3 refills | Status: AC

## 2017-04-29 NOTE — Progress Notes (Signed)
Subjective: 61 y.o. returns the office today for painful, elongated, thickened toenails which he cannot trim himself. Denies any redness or drainage around the nails.  He also did fall several weeks ago scraping both of his legs.  He has superficial wounds on the anterior aspect of both legs but his wife has been putting antibiotic ointment and a bandage on daily.  Denies any drainage or pus any redness or red streaks in the areas appear to be healing.  Denies any acute changes since last appointment and no new complaints today. Denies any systemic complaints such as fevers, chills, nausea, vomiting.   PCP: Donita BrooksPickard, Warren T, MD   Objective: AAO 3, NAD DP/PT pulses decreased however unchanged, CRT less than 3 seconds Protective sensation decreased with Simms Weinstein monofilament, Achilles tendon reflex intact.  Nails hypertrophic, dystrophic, elongated, brittle, discolored 10. There is tenderness overlying the nails 1-5 bilaterally. There is no surrounding erythema or drainage along the nail sites. On the anterior aspect of bilateral legs a small superficial abrasions one on each leg.  There is no surrounding erythema, ascending sialitis.  There is no fluctuance or crepitus.  There is no malodor.  No clinical signs of infection. No open lesions or pre-ulcerative lesions are identified. No other areas of tenderness bilateral lower extremities. No overlying edema, erythema, increased warmth. No pain with calf compression, swelling, warmth, erythema.  Assessment: Patient presents with symptomatic onychomycosis; superficial wounds bilateral legs  Plan: -Treatment options including alternatives, risks, complications were discussed -Nails sharply debrided 10 without complication/bleeding. -Recommended to continue with a small amount of antibiotic ointment dressing changes of the wounds daily.  Monitor for signs or symptoms of infection call the office immediately should any occur.  I will see him  back in 3 weeks to check the wounds or sooner if any issues arise -Discussed daily foot inspection. If there are any changes, to call the office immediately.  -Follow-up in 3 months for routine care or sooner if any problems are to arise. In the meantime, encouraged to call the office with any questions, concerns, changes symptoms.  Ovid CurdMatthew Wagoner, DPM

## 2017-04-29 NOTE — Telephone Encounter (Signed)
Medication called/sent to requested pharmacy  

## 2017-04-29 NOTE — Telephone Encounter (Signed)
Pt needs refill on protonix and trental sent to [facility]  °

## 2017-05-04 ENCOUNTER — Ambulatory Visit: Payer: Self-pay | Admitting: Family Medicine

## 2017-05-12 ENCOUNTER — Inpatient Hospital Stay: Payer: Medicaid Other | Admitting: Family Medicine

## 2017-05-13 ENCOUNTER — Inpatient Hospital Stay: Payer: Medicaid Other | Admitting: Family Medicine

## 2017-05-14 ENCOUNTER — Telehealth: Payer: Self-pay | Admitting: *Deleted

## 2017-05-14 NOTE — Telephone Encounter (Signed)
Pt left "Alexander Hubbard" and phone number. 05/14/2017-Unable to leave a message phone rang for over 1 minute without answering service.

## 2017-05-15 ENCOUNTER — Ambulatory Visit (INDEPENDENT_AMBULATORY_CARE_PROVIDER_SITE_OTHER): Payer: Medicaid Other | Admitting: Family Medicine

## 2017-05-15 VITALS — BP 124/68 | HR 64 | Temp 98.2°F | Resp 16 | Ht 68.0 in | Wt 258.0 lb

## 2017-05-15 DIAGNOSIS — D638 Anemia in other chronic diseases classified elsewhere: Secondary | ICD-10-CM | POA: Diagnosis not present

## 2017-05-15 DIAGNOSIS — Z09 Encounter for follow-up examination after completed treatment for conditions other than malignant neoplasm: Secondary | ICD-10-CM

## 2017-05-15 NOTE — Progress Notes (Signed)
Subjective:    Patient ID: Alexander Hubbard, male    DOB: 10-17-1955, 61 y.o.   MRN: [id number]  HPI Patient was recently [facility] from November 30 - December 7.  I have reviewed his discharge summary.  Found to have community-acquired pneumonia as well as acute on chronic renal insufficiency along with symptomatic bradycardia.  Patient had a creatinine of 3.92 on admission.  His baseline is approximately 3.2.  However throughout his hospitalization, his creatinine continue to worsen ultimately rising to 4.6.  On December 4, he was found to be bradycardic in the 40s and had a presyncopal episode.  EKG revealed second-degree block.  He actually required a dose of atropine.  EP cardiology was consulted and a dual-chamber pacemaker was placed December 5.  Patient tolerated the procedure well and his malaise and presyncopal symptoms improved immediately after pacemaker placement.  Also his creatinine began to trend downward.  It was felt that the persistent bradycardia may have been causing prerenal azotemia.  Chest x-ray on admission showed right lower lobe pneumonia.  He was treated with azithromycin and Rocephin for 5-day course with clinical improvement.  Patient also was found to have iron deficiency anemia.  An outpatient colonoscopy was recommended.  Hemoglobin on December 4 was 7.8.  Hemoglobin on the day of discharge was 7.6. Patient feels much better.  He denies any chest pain or shortness of breath or dyspnea on exertion.  He denies any pleurisy.  He denies any melena or hematochezia.  His blood pressure today is stable.  However on his exam today, there is a new murmur that I have not appreciated before.  It is heard best over the aortic valve and is 2-3/6.   Past Medical History:  Diagnosis Date  . AKI (acute kidney injury) (HCC) 07/2015  . CHF (congestive heart failure) (HCC)   . Diabetes mellitus   . Glaucoma (increased eye pressure)   . Gout  attack 07/2015   RIGHT HAND LEFT SHOULDER RIGHT KNEE DOWN   . Gouty arthritis   . Hypertension   . Neuropathy of both feet   . Obesity   . Sleep apnea   . UTI (urinary tract infection) 07/2015  . Varicose veins    Past Surgical History:  Procedure Laterality Date  . CHOLECYSTECTOMY, LAPAROSCOPIC     05/2016 at [facility] complicated by post op abdominal abscess-E Faecium  . EYE SURGERY    . GASTRIC BYPASS    . KNEE SURGERY     Current Outpatient Medications on File Prior to Visit  Medication Sig Dispense Refill  . aspirin 81 MG tablet Take 81 mg by mouth daily.    . brinzolamide (AZOPT) 1 % ophthalmic suspension Place 1 drop into the left eye 2 (two) times daily.     . Calcium Citrate-Vitamin D (CALCIUM + D PO) Take 1 tablet by mouth 2 (two) times daily.     . colchicine 0.6 MG tablet Take 0.6 mg by mouth daily.    Marland Kitchen. docusate sodium (COLACE) 100 MG capsule Take 100 mg by mouth 2 (two) times daily.    Marland Kitchen. FERROUS SULFATE PO Take by mouth.    . furosemide (LASIX) 40 MG tablet Take 80 mg by mouth 2 (two) times daily.   2  . gabapentin (NEURONTIN) 300 MG capsule TAKE 2 CAPSULES BY MOUTH 3 TIMES A DAY 180 capsule 3  . glucose blood (ACCU-CHEK SMARTVIEW) test strip 1 each by Other route  See admin instructions. Check blood sugar 3 times daily.    Marland Kitchen. latanoprost (XALATAN) 0.005 % ophthalmic solution Place 1 drop into the left eye at bedtime.     Marland Kitchen. LINZESS 145 MCG CAPS capsule TAKE 1 CAPSULE BY MOUTH DAILY. (Patient taking differently: TAKE 1 CAPSULE BY MOUTH DAILY PRN) 30 capsule 5  . Multiple Vitamins-Minerals (MULTIVITAMIN & MINERAL PO) Take 1 tablet by mouth 2 (two) times daily.     . pantoprazole (PROTONIX) 40 MG tablet Take 1 tablet (40 mg total) by mouth 2 (two) times daily. 180 tablet 3  . rosuvastatin (CRESTOR) 5 MG tablet Take 1 tablet (5 mg total) by mouth daily. 90 tablet 3  . sodium bicarbonate 650 MG tablet Take 650 mg by mouth 3 (three) times daily.     . timolol (TIMOPTIC) 0.5 %  ophthalmic solution Place 1 drop into both eyes 2 (two) times daily.    Marland Kitchen. ULORIC 40 MG tablet Take 1 tablet (40 mg total) by mouth daily. 30 tablet 5  . UNABLE TO FIND Dispense one pair of Diabetic Shoes. ICD E11.49, G62.9, B35.1, R60.9 1 each 0  . valsartan (DIOVAN) 40 MG tablet Take 20 mg by mouth daily.     . vitamin B-12 (CYANOCOBALAMIN) 1000 MCG tablet Take 1,000 mcg by mouth every other day. (Chewable tablet)    . pentoxifylline (TRENTAL) 400 MG CR tablet Take 1 tablet (400 mg total) by mouth 3 (three) times daily with meals. (Patient not taking: Reported on 05/15/2017) 270 tablet 1   No current facility-administered medications on file prior to visit.    Allergies  Allergen Reactions  . Penicillins Swelling and Rash  . Amoxicillin Swelling and Rash  . Niacin Itching and Other (See Comments)    Other reaction(s): Flushing (ALLERGY/intolerance)  . Penicillin G Rash   Social History   Socioeconomic History  . Marital status: Married    Spouse name: Not on file  . Number of children: Not on file  . Years of education: Not on file  . Highest education level: Not on file  Social Needs  . Financial resource strain: Not on file  . Food insecurity - worry: Not on file  . Food insecurity - inability: Not on file  . Transportation needs - medical: Not on file  . Transportation needs - non-medical: Not on file  Occupational History  . Not on file  Tobacco Use  . Smoking status: Never Smoker  . Smokeless tobacco: Never Used  Substance and Sexual Activity  . Alcohol use: No    Alcohol/week: 0.0 oz  . Drug use: No  . Sexual activity: Not on file  Other Topics Concern  . Not on file  Social History Narrative  . Not on file     Review of Systems  All other systems reviewed and are negative.      Objective:   Physical Exam  Constitutional: He appears well-developed and well-nourished.  Neck: Neck supple. No JVD present.  Cardiovascular: Normal rate and regular rhythm.    Murmur heard. Pulmonary/Chest: Effort normal and breath sounds normal. No respiratory distress. He has no wheezes. He has no rales.  Abdominal: Soft. Bowel sounds are normal. He exhibits no distension. There is no tenderness. There is no rebound and no guarding.  Musculoskeletal: He exhibits no edema.  Lymphadenopathy:    He has no cervical adenopathy.  Vitals reviewed.         Assessment & Plan:  Hospital discharge follow-up - Plan:  CBC with Differential/Platelet, BASIC METABOLIC PANEL WITH GFR  Anemia of chronic disease - Plan: Fecal occult blood, imunochemical, Fecal occult blood, imunochemical, Fecal occult blood, imunochemical  I will recheck his renal function today with a BMP.  His baseline creatinine is 3.2.  I will fax these results to his nephrologist at [facility] as soon as they come back Monday.  I am concerned about the murmur which is new on his exam.  I do not believe that a pacemaker would cause this I am concerned about possible aortic stenosis.  Patient states that he had an echocardiogram while in the hospital at [facility].  I have gotten his permission to receive the results of this so that I can review them myself to determine what the cause of his new murmur is.  I am very concerned about his anemia.  His discharge hemoglobin was 7.6.  I believe this is most likely secondary to iron deficiency coupled with his chronic kidney disease.  He may be a good candidate for erythropoietin therapy through his nephrologist.  However first I want to rule out occult GI bleed.  I will obtain fecal occult blood cards x3.  If there is blood in any of these results, I would recommend a GI consultation to rule out an occult GI bleed.  If the stool cards are negative x3, I would encourage the patient to follow-up with his nephrologist and discuss possible Aranesp versus erythropoietin.

## 2017-05-16 LAB — BASIC METABOLIC PANEL WITH GFR
BUN / CREAT RATIO: 19 (calc) (ref 6–22)
BUN: 69 mg/dL — ABNORMAL HIGH (ref 7–25)
CO2: 28 mmol/L (ref 20–32)
Calcium: 8.4 mg/dL — ABNORMAL LOW (ref 8.6–10.3)
Chloride: 100 mmol/L (ref 98–110)
Creat: 3.72 mg/dL — ABNORMAL HIGH (ref 0.70–1.25)
GFR, Est African American: 19 mL/min/{1.73_m2} — ABNORMAL LOW (ref >=60)
GFR, Est Non African American: 17 mL/min/{1.73_m2} — ABNORMAL LOW (ref >=60)
GLUCOSE: 140 mg/dL — AB (ref 65–99)
POTASSIUM: 4.9 mmol/L (ref 3.5–5.3)
SODIUM: 139 mmol/L (ref 135–146)

## 2017-05-16 LAB — CBC WITH DIFFERENTIAL/PLATELET
BASOS ABS: 44 {cells}/uL (ref 0–200)
Basophils Relative: 0.5 %
EOS PCT: 3.2 %
Eosinophils Absolute: 282 cells/uL (ref 15–500)
HCT: 27.7 % — ABNORMAL LOW (ref 38.5–50.0)
Hemoglobin: 8.9 g/dL — ABNORMAL LOW (ref 13.2–17.1)
Lymphs Abs: 2297 cells/uL (ref 850–3900)
MCH: 28.5 pg (ref 27.0–33.0)
MCHC: 32.1 g/dL (ref 32.0–36.0)
MCV: 88.8 fL (ref 80.0–100.0)
MONOS PCT: 4 %
MPV: 10.9 fL (ref 7.5–12.5)
NEUTROS PCT: 66.2 %
Neutro Abs: 5826 cells/uL (ref 1500–7800)
Platelets: 249 10*3/uL (ref 140–400)
RBC: 3.12 10*6/uL — ABNORMAL LOW (ref 4.20–5.80)
RDW: 12.6 % (ref 11.0–15.0)
Total Lymphocyte: 26.1 %
WBC mixed population: 352 cells/uL (ref 200–950)
WBC: 8.8 10*3/uL (ref 3.8–10.8)

## 2017-05-18 ENCOUNTER — Other Ambulatory Visit: Payer: Medicaid Other

## 2017-05-18 DIAGNOSIS — D649 Anemia, unspecified: Secondary | ICD-10-CM

## 2017-05-19 LAB — FECAL GLOBIN BY IMMUNOCHEMISTRY
FECAL GLOBIN RESULT: NOT DETECTED
FECAL GLOBIN RESULT: NOT DETECTED
FECAL GLOBIN RESULT: NOT DETECTED
MICRO NUMBER: 81415980
MICRO NUMBER:: 81415776
MICRO NUMBER:: 81415816
SPECIMEN QUALITY: ADEQUATE
SPECIMEN QUALITY:: ADEQUATE
SPECIMEN QUALITY:: ADEQUATE

## 2017-05-20 ENCOUNTER — Other Ambulatory Visit: Payer: Self-pay | Admitting: Family Medicine

## 2017-06-09 ENCOUNTER — Ambulatory Visit
Admission: RE | Admit: 2017-06-09 | Discharge: 2017-06-09 | Disposition: A | Discharge status: Home / Self Care | Payer: Medicaid Other | Source: Ambulatory Visit | Attending: General Surgery | Admitting: General Surgery

## 2017-06-09 ENCOUNTER — Other Ambulatory Visit: Payer: Self-pay | Admitting: General Surgery

## 2017-06-09 DIAGNOSIS — R109 Unspecified abdominal pain: Secondary | ICD-10-CM

## 2017-06-29 ENCOUNTER — Encounter: Payer: Self-pay | Admitting: Family Medicine

## 2017-06-29 DIAGNOSIS — N184 Chronic kidney disease, stage 4 (severe): Secondary | ICD-10-CM | POA: Insufficient documentation

## 2017-06-30 ENCOUNTER — Other Ambulatory Visit: Payer: Self-pay | Admitting: Family Medicine

## 2017-07-02 ENCOUNTER — Ambulatory Visit (INDEPENDENT_AMBULATORY_CARE_PROVIDER_SITE_OTHER): Payer: Medicaid Other | Admitting: Family Medicine

## 2017-07-02 ENCOUNTER — Encounter: Payer: Self-pay | Admitting: Family Medicine

## 2017-07-02 ENCOUNTER — Ambulatory Visit
Admission: RE | Admit: 2017-07-02 | Discharge: 2017-07-02 | Disposition: A | Discharge status: Home / Self Care | Payer: Medicaid Other | Source: Ambulatory Visit | Attending: Family Medicine | Admitting: Family Medicine

## 2017-07-02 VITALS — BP 92/56 | HR 70 | Temp 97.9°F | Resp 16 | Ht 68.0 in | Wt 255.0 lb

## 2017-07-02 DIAGNOSIS — R1012 Left upper quadrant pain: Secondary | ICD-10-CM

## 2017-07-02 LAB — CBC WITH DIFFERENTIAL/PLATELET
BASOS PCT: 0.9 %
Basophils Absolute: 63 cells/uL (ref 0–200)
EOS ABS: 238 {cells}/uL (ref 15–500)
EOS PCT: 3.4 %
HCT: 29.2 % — ABNORMAL LOW (ref 38.5–50.0)
Hemoglobin: 9.5 g/dL — ABNORMAL LOW (ref 13.2–17.1)
Lymphs Abs: 2275 cells/uL (ref 850–3900)
MCH: 27.9 pg (ref 27.0–33.0)
MCHC: 32.5 g/dL (ref 32.0–36.0)
MCV: 85.9 fL (ref 80.0–100.0)
MONOS PCT: 4.7 %
MPV: 12 fL (ref 7.5–12.5)
Neutro Abs: 4095 cells/uL (ref 1500–7800)
Neutrophils Relative %: 58.5 %
PLATELETS: 160 10*3/uL (ref 140–400)
RBC: 3.4 10*6/uL — ABNORMAL LOW (ref 4.20–5.80)
RDW: 13 % (ref 11.0–15.0)
TOTAL LYMPHOCYTE: 32.5 %
WBC mixed population: 329 cells/uL (ref 200–950)
WBC: 7 10*3/uL (ref 3.8–10.8)

## 2017-07-02 LAB — COMPLETE METABOLIC PANEL WITH GFR
AG Ratio: 1.3 (calc) (ref 1.0–2.5)
ALT: 43 U/L (ref 9–46)
AST: 34 U/L (ref 10–35)
Albumin: 3.4 g/dL — ABNORMAL LOW (ref 3.6–5.1)
Alkaline phosphatase (APISO): 98 U/L (ref 40–115)
BILIRUBIN TOTAL: 0.3 mg/dL (ref 0.2–1.2)
BUN/Creatinine Ratio: 22 (calc) (ref 6–22)
BUN: 81 mg/dL — AB (ref 7–25)
CHLORIDE: 106 mmol/L (ref 98–110)
CO2: 21 mmol/L (ref 20–32)
Calcium: 8 mg/dL — ABNORMAL LOW (ref 8.6–10.3)
Creat: 3.65 mg/dL — ABNORMAL HIGH (ref 0.70–1.25)
GFR, Est African American: 20 mL/min/{1.73_m2} — ABNORMAL LOW (ref >=60)
GFR, Est Non African American: 17 mL/min/{1.73_m2} — ABNORMAL LOW (ref >=60)
GLUCOSE: 129 mg/dL — AB (ref 65–99)
Globulin: 2.6 g/dL (calc) (ref 1.9–3.7)
Potassium: 4.8 mmol/L (ref 3.5–5.3)
Sodium: 139 mmol/L (ref 135–146)
TOTAL PROTEIN: 6 g/dL — AB (ref 6.1–8.1)

## 2017-07-02 LAB — LIPASE: LIPASE: 13 U/L (ref 7–60)

## 2017-07-02 MED ORDER — TRAMADOL HCL 50 MG PO TABS: 50.0000 mg | ORAL_TABLET | Freq: Three times a day (TID) | ORAL | 0 refills | Status: AC | PRN

## 2017-07-02 MED ORDER — FERROUS SULFATE 325 (65 FE) MG PO TABS: 325.0000 mg | ORAL_TABLET | Freq: Every day | ORAL | 3 refills | Status: AC

## 2017-07-02 NOTE — Progress Notes (Signed)
Subjective:    Patient ID: Alexander Hubbard, male    DOB: 1956/02/12, 62 y.o.   MRN: [id number]  HPI  Patient presents with 3 days of left upper quadrant abdominal pain.  He is sore to touch in the left upper quadrant.  It hurts when he tries to sit up or when he twists around in bed.  Food does not make it worse.  He is tried Tums without any improvement.  He is on twice daily proton pump inhibitor without improvement.  He denies any nausea or vomiting.  He denies any melena or hematochezia.  His pain seems to be superficial.  It seems to be muscular.  He denies any constipation or diarrhea.  He denies any fevers or chills.  Past medical history is significant for several abdominal surgeries including gastric bypass as well as cholecystectomy.  Therefore adhesions are also on the differential diagnosis. Past Medical History:  Diagnosis Date  . AKI (acute kidney injury) (HCC) 07/2015  . CHF (congestive heart failure) (HCC)   . CKD (chronic kidney disease) stage 4, GFR 15-29 ml/min (HCC)    Dr. Jean Rosenthal at [facility] is nephrologist  . Diabetes mellitus   . Glaucoma (increased eye pressure)   . Gout attack 07/2015   RIGHT HAND LEFT SHOULDER RIGHT KNEE DOWN   . Gouty arthritis   . Hypertension   . Neuropathy of both feet   . Obesity   . Senile calcific aortic valve sclerosis   . Sleep apnea   . UTI (urinary tract infection) 07/2015  . Varicose veins    Past Surgical History:  Procedure Laterality Date  . CHOLECYSTECTOMY, LAPAROSCOPIC     05/2016 at [facility] complicated by post op abdominal abscess-E Faecium  . EYE SURGERY    . GASTRIC BYPASS    . KNEE SURGERY     Current Outpatient Medications on File Prior to Visit  Medication Sig Dispense Refill  . ACCU-CHEK AVIVA PLUS test strip USE TO TEST BLOOD SUGAR BEFORE BREAKFAST. IF NOT FEELING WELL UP TO 2 TIMES DAILY 200 each 2  . ammonium lactate (LAC-HYDRIN) 12 % lotion APPLY TO LEGS 2 TIMES DAILY 500 g 1  . aspirin 81 MG tablet Take 81 mg by  mouth daily.    . brinzolamide (AZOPT) 1 % ophthalmic suspension Place 1 drop into the left eye 2 (two) times daily.     . Calcium Citrate-Vitamin D (CALCIUM + D PO) Take 1 tablet by mouth 2 (two) times daily.     . colchicine 0.6 MG tablet Take 0.6 mg by mouth daily.    Marland Kitchen docusate sodium (COLACE) 100 MG capsule Take 100 mg by mouth 2 (two) times daily.    . furosemide (LASIX) 40 MG tablet Take 80 mg by mouth 2 (two) times daily.   2  . gabapentin (NEURONTIN) 300 MG capsule TAKE 2 CAPSULES BY MOUTH 3 TIMES A DAY 180 capsule 3  . latanoprost (XALATAN) 0.005 % ophthalmic solution Place 1 drop into the left eye at bedtime.     Marland Kitchen LINZESS 145 MCG CAPS capsule TAKE 1 CAPSULE BY MOUTH DAILY. (Patient taking differently: TAKE 1 CAPSULE BY MOUTH DAILY PRN) 30 capsule 5  . losartan (COZAAR) 25 MG tablet Take by mouth.    . Multiple Vitamins-Minerals (MULTIVITAMIN & MINERAL PO) Take 1 tablet by mouth 2 (two) times daily.     . pantoprazole (PROTONIX) 40 MG tablet Take 1 tablet (40 mg total) by mouth 2 (two) times daily. 180  tablet 3  . rosuvastatin (CRESTOR) 5 MG tablet Take 1 tablet (5 mg total) by mouth daily. 90 tablet 3  . sodium bicarbonate 650 MG tablet Take 650 mg by mouth 3 (three) times daily.     . timolol (TIMOPTIC) 0.5 % ophthalmic solution Place 1 drop into both eyes 2 (two) times daily.    Marland Kitchen. ULORIC 40 MG tablet Take 1 tablet (40 mg total) by mouth daily. 30 tablet 5  . UNABLE TO FIND Dispense one pair of Diabetic Shoes. ICD E11.49, G62.9, B35.1, R60.9 1 each 0  . vitamin B-12 (CYANOCOBALAMIN) 1000 MCG tablet Take 1,000 mcg by mouth every other day. (Chewable tablet)     No current facility-administered medications on file prior to visit.    Allergies  Allergen Reactions  . Penicillins Swelling and Rash  . Amoxicillin Swelling and Rash  . Niacin Itching and Other (See Comments)    Other reaction(s): Flushing (ALLERGY/intolerance)  . Penicillin G Rash   Social History   Socioeconomic  History  . Marital status: Married    Spouse name: Not on file  . Number of children: Not on file  . Years of education: Not on file  . Highest education level: Not on file  Social Needs  . Financial resource strain: Not on file  . Food insecurity - worry: Not on file  . Food insecurity - inability: Not on file  . Transportation needs - medical: Not on file  . Transportation needs - non-medical: Not on file  Occupational History  . Not on file  Tobacco Use  . Smoking status: Never Smoker  . Smokeless tobacco: Never Used  Substance and Sexual Activity  . Alcohol use: No    Alcohol/week: 0.0 oz  . Drug use: No  . Sexual activity: Not on file  Other Topics Concern  . Not on file  Social History Narrative  . Not on file     Review of Systems  All other systems reviewed and are negative.      Objective:   Physical Exam  Constitutional: He appears well-developed and well-nourished. No distress.  Cardiovascular: Normal rate, regular rhythm and normal heart sounds.  Pulmonary/Chest: Effort normal and breath sounds normal. No respiratory distress. He has no wheezes. He has no rales.  Abdominal: Soft. He exhibits no distension. There is tenderness. There is no rebound and no guarding.  Skin: He is not diaphoretic.  Vitals reviewed.         Assessment & Plan:  LUQ abdominal pain - Plan: traMADol (ULTRAM) 50 MG tablet, DG Abd Acute W/Chest, Lipase, CBC with Differential/Platelet, COMPLETE METABOLIC PANEL WITH GFR  Patient's pain seems to be very superficial to the touch.  It also has made worse by movement which makes me think it is musculoskeletal/muscular in nature.  There is no worsening of the pain with ingestion of food.  There is no nausea or vomiting.  There is no constipation or diarrhea.  There is no reflux.  There is no melena or hematochezia.  Therefore I think the patient may have strained an abdominal wall muscle.  Adhesions would also be a possible cause of his  pain.  I will obtain an x-ray of the abdomen to evaluate for any evidence of bowel obstruction or ileus or free air in the diaphragm.  I will also check his CBCs to evaluate for leukocytosis along with a CMP as well as a lipase.  I will symptomatically treat the patient with tramadol 50 mg  every 8 hours as needed pain and monitor the patient to see if his pain improves over the next 24 hours.  Exam does not support an acute abdomen and symptoms do not support an ulcer.

## 2017-07-30 ENCOUNTER — Ambulatory Visit: Payer: Medicaid Other | Admitting: Podiatry

## 2017-08-24 ENCOUNTER — Ambulatory Visit: Payer: Medicaid Other | Admitting: Podiatry

## 2017-08-24 ENCOUNTER — Telehealth: Payer: Self-pay | Admitting: *Deleted

## 2017-08-24 ENCOUNTER — Encounter: Payer: Self-pay | Admitting: Podiatry

## 2017-08-24 DIAGNOSIS — E1149 Type 2 diabetes mellitus with other diabetic neurological complication: Secondary | ICD-10-CM

## 2017-08-24 DIAGNOSIS — B351 Tinea unguium: Secondary | ICD-10-CM | POA: Diagnosis not present

## 2017-08-24 DIAGNOSIS — M79676 Pain in unspecified toe(s): Secondary | ICD-10-CM

## 2017-08-24 DIAGNOSIS — I739 Peripheral vascular disease, unspecified: Secondary | ICD-10-CM

## 2017-08-24 DIAGNOSIS — M79661 Pain in right lower leg: Secondary | ICD-10-CM

## 2017-08-24 DIAGNOSIS — R609 Edema, unspecified: Secondary | ICD-10-CM

## 2017-08-24 NOTE — Telephone Encounter (Signed)
[employer] APPROVED VENOUS DOPPLER RIGHT LEG R/O DVT, AUTHORIZATION CODE [id number], VALID 08/04/2017 THROUGH END DATE 09/23/2017. Faxed the venous doppler order, demographics and PA to [facility].

## 2017-08-25 ENCOUNTER — Telehealth: Payer: Self-pay | Admitting: *Deleted

## 2017-08-25 ENCOUNTER — Ambulatory Visit (HOSPITAL_COMMUNITY)
Admission: RE | Admit: 2017-08-25 | Discharge: 2017-08-25 | Disposition: A | Discharge status: Home / Self Care | Payer: Medicaid Other | Source: Ambulatory Visit | Attending: Cardiovascular Disease | Admitting: Cardiovascular Disease

## 2017-08-25 DIAGNOSIS — M79661 Pain in right lower leg: Secondary | ICD-10-CM | POA: Diagnosis present

## 2017-08-25 DIAGNOSIS — R609 Edema, unspecified: Secondary | ICD-10-CM

## 2017-08-25 NOTE — Telephone Encounter (Signed)
Dois DavenportSandra [facility] states pt right leg is negative for DVT.

## 2017-08-26 ENCOUNTER — Ambulatory Visit: Payer: Medicaid Other | Admitting: Family Medicine

## 2017-08-26 ENCOUNTER — Encounter: Payer: Self-pay | Admitting: Family Medicine

## 2017-08-26 VITALS — BP 144/82 | HR 74 | Temp 98.0°F | Resp 18 | Ht 68.0 in | Wt 262.0 lb

## 2017-08-26 DIAGNOSIS — S161XXA Strain of muscle, fascia and tendon at neck level, initial encounter: Secondary | ICD-10-CM | POA: Diagnosis not present

## 2017-08-26 DIAGNOSIS — Z950 Presence of cardiac pacemaker: Secondary | ICD-10-CM | POA: Insufficient documentation

## 2017-08-26 LAB — HM DIABETES EYE EXAM

## 2017-08-26 MED ORDER — CYCLOBENZAPRINE HCL 10 MG PO TABS: 10.0000 mg | ORAL_TABLET | Freq: Three times a day (TID) | ORAL | 0 refills | Status: AC | PRN

## 2017-08-26 NOTE — Progress Notes (Signed)
Subjective:    Patient ID: Alexander Hubbard, male    DOB: 02-06-1956, 62 y.o.   MRN: [id number]  HPI  Patient awoke 2 days ago with a pain in the upper portion of his neck at the base of the skull.  This is near the insertion of the trapezius muscle.  He states that is very sore in that area.  It hurts to bend his chin forward.  It hurts to turn his chin and side to side.  He denies any injury.  He denies any numbness or tingling or weakness in his arm.  He denies any numbness or tingling or weakness in his legs.  He denies any fevers or chills or recent sick contacts.  He is nontoxic appearing.  He is tender to palpation superficially in the trapezius muscle at its insertion on the base of the skull. Past Medical History:  Diagnosis Date  . AKI (acute kidney injury) (HCC) 07/2015  . CHF (congestive heart failure) (HCC)   . CKD (chronic kidney disease) stage 4, GFR 15-29 ml/min (HCC)    Dr. Jean Rosenthal at [facility] is nephrologist  . Diabetes mellitus   . Glaucoma (increased eye pressure)   . Gout attack 07/2015   RIGHT HAND LEFT SHOULDER RIGHT KNEE DOWN   . Gouty arthritis   . Hypertension   . Neuropathy of both feet   . Obesity   . Pacemaker    2018  . Senile calcific aortic valve sclerosis   . Sleep apnea   . UTI (urinary tract infection) 07/2015  . Varicose veins    Past Surgical History:  Procedure Laterality Date  . CHOLECYSTECTOMY, LAPAROSCOPIC     05/2016 at [facility] complicated by post op abdominal abscess-E Faecium  . dual chamber pacemaker     due to symptomatic bardycardia/ av block [facility])  . EYE SURGERY    . GASTRIC BYPASS    . KNEE SURGERY     Current Outpatient Medications on File Prior to Visit  Medication Sig Dispense Refill  . ACCU-CHEK AVIVA PLUS test strip USE TO TEST BLOOD SUGAR BEFORE BREAKFAST. IF NOT FEELING WELL UP TO 2 TIMES DAILY 200 each 2  . ammonium lactate (LAC-HYDRIN) 12 % lotion APPLY TO LEGS 2 TIMES DAILY 500 g 1  . aspirin 81 MG tablet Take 81 mg  by mouth daily.    . brinzolamide (AZOPT) 1 % ophthalmic suspension Place 1 drop into the left eye 2 (two) times daily.     . Calcium Citrate-Vitamin D (CALCIUM + D PO) Take 1 tablet by mouth 2 (two) times daily.     . colchicine 0.6 MG tablet Take 0.6 mg by mouth daily.    Marland Kitchen docusate sodium (COLACE) 100 MG capsule Take 100 mg by mouth 2 (two) times daily.    . ferrous sulfate 325 (65 FE) MG tablet Take 1 tablet (325 mg total) by mouth daily with breakfast. 30 tablet 3  . furosemide (LASIX) 40 MG tablet Take 80 mg by mouth 2 (two) times daily.   2  . gabapentin (NEURONTIN) 300 MG capsule TAKE 2 CAPSULES BY MOUTH 3 TIMES A DAY 180 capsule 3  . latanoprost (XALATAN) 0.005 % ophthalmic solution Place 1 drop into the left eye at bedtime.     Marland Kitchen LINZESS 145 MCG CAPS capsule TAKE 1 CAPSULE BY MOUTH DAILY. (Patient taking differently: TAKE 1 CAPSULE BY MOUTH DAILY PRN) 30 capsule 5  . losartan (COZAAR) 25 MG tablet Take by mouth.    Marland Kitchen  Multiple Vitamins-Minerals (MULTIVITAMIN & MINERAL PO) Take 1 tablet by mouth 2 (two) times daily.     . pantoprazole (PROTONIX) 40 MG tablet Take 1 tablet (40 mg total) by mouth 2 (two) times daily. 180 tablet 3  . rosuvastatin (CRESTOR) 5 MG tablet Take 1 tablet (5 mg total) by mouth daily. 90 tablet 3  . sodium bicarbonate 650 MG tablet Take 650 mg by mouth 3 (three) times daily.     . timolol (TIMOPTIC) 0.5 % ophthalmic solution Place 1 drop into both eyes 2 (two) times daily.    . traMADol (ULTRAM) 50 MG tablet Take 1 tablet (50 mg total) by mouth every 8 (eight) hours as needed. 30 tablet 0  . ULORIC 40 MG tablet Take 1 tablet (40 mg total) by mouth daily. 30 tablet 5  . UNABLE TO FIND Dispense one pair of Diabetic Shoes. ICD E11.49, G62.9, B35.1, R60.9 1 each 0  . vitamin B-12 (CYANOCOBALAMIN) 1000 MCG tablet Take 1,000 mcg by mouth every other day. (Chewable tablet)    . diltiazem (CARDIZEM CD) 120 MG 24 hr capsule Take 120 mg by mouth daily. Not started yet will  start on 08/27/17     No current facility-administered medications on file prior to visit.    Allergies  Allergen Reactions  . Penicillins Swelling and Rash  . Amoxicillin Swelling and Rash  . Niacin Itching and Other (See Comments)    Other reaction(s): Flushing (ALLERGY/intolerance)  . Penicillin G Rash   Social History   Socioeconomic History  . Marital status: Married    Spouse name: Not on file  . Number of children: Not on file  . Years of education: Not on file  . Highest education level: Not on file  Occupational History  . Not on file  Social Needs  . Financial resource strain: Not on file  . Food insecurity:    Worry: Not on file    Inability: Not on file  . Transportation needs:    Medical: Not on file    Non-medical: Not on file  Tobacco Use  . Smoking status: Never Smoker  . Smokeless tobacco: Never Used  Substance and Sexual Activity  . Alcohol use: No    Alcohol/week: 0.0 oz  . Drug use: No  . Sexual activity: Not on file  Lifestyle  . Physical activity:    Days per week: Not on file    Minutes per session: Not on file  . Stress: Not on file  Relationships  . Social connections:    Talks on phone: Not on file    Gets together: Not on file    Attends religious service: Not on file    Active member of club or organization: Not on file    Attends meetings of clubs or organizations: Not on file    Relationship status: Not on file  . Intimate partner violence:    Fear of current or ex partner: Not on file    Emotionally abused: Not on file    Physically abused: Not on file    Forced sexual activity: Not on file  Other Topics Concern  . Not on file  Social History Narrative  . Not on file     Review of Systems  All other systems reviewed and are negative.      Objective:   Physical Exam  Neck: Trachea normal. Muscular tenderness present. No spinous process tenderness present. No neck rigidity. Decreased range of motion present. No edema  and no erythema present.    Cardiovascular: Normal rate and regular rhythm.  Murmur heard. Pulmonary/Chest: Effort normal and breath sounds normal.  Vitals reviewed.         Assessment & Plan:  Acute strain of neck muscle, initial encounter  I believe the patient has a strain in his trapezius muscle at its insertion at the base of the skull.  This appears to be a "crick" in the neck.  I have recommended heat be applied to the area.  He can use Biofreeze or icy hot as well.  He can use Flexeril 5-10 mg every 8 hours as needed for muscle spasms.  Recommended gentle range of motion exercises.  Recheck immediately if the patient feels sick or if he develops a fever or if he develops any numbness or tingling in his upper extremities.

## 2017-08-26 NOTE — Progress Notes (Signed)
Subjective: 62 y.o. returns the office today for painful, elongated, thickened toenails which he cannot trim himself. Denies any redness or drainage around the nails. Denies any acute changes since last appointment and no new complaints today. Denies any systemic complaints such as fevers, chills, nausea, vomiting.   PCP: Donita BrooksPickard, Warren T, MD   Objective: AAO 3, NAD DP/PT pulses decreased, CRT less than 3 seconds Protective sensation decreased with Simms Weinstein monofilament Nails hypertrophic, dystrophic, elongated, brittle, discolored 10. There is tenderness overlying the nails 1-5 bilaterally. There is no surrounding erythema or drainage along the nail sites. The right calf appears to be hardened and there is pain with calf compression but there is no erythema or increase in warmth.  Left calf is supple. No open lesions or pre-ulcerative lesions are identified. No other areas of tenderness bilateral lower extremities. No overlying edema, erythema, increased warmth.  Assessment: Patient presents with symptomatic onychomycosis; right calf swelling concerning for DVT  Plan: -Treatment options including alternatives, risks, complications were discussed -Nails sharply debrided 10 without complication/bleeding. -Recommend to the emergency room.  He declined this.  We did get him scheduled for an ultrasound to rule out blood clot but is not until tomorrow.  Discussed the importance of keeping this appointment for 230 tomorrow but there is any worsening go to the emergency room.  He currently denies any chest pain or shortness of breath. -Discussed daily foot inspection. If there are any changes, to call the office immediately.  -Follow-up in 3 months or sooner if any problems are to arise. In the meantime, encouraged to call the office with any questions, concerns, changes symptoms.  Ovid CurdMatthew Granite Godman, DPM

## 2017-08-28 ENCOUNTER — Ambulatory Visit: Payer: Medicaid Other | Admitting: Family Medicine

## 2017-09-01 NOTE — Telephone Encounter (Signed)
Left message informing pt of Dr. Gabriel RungWagoner's review of results and orders, that I would send a copy of the results to his PCP Dr. Lynnea FerrierWarren Pickard.

## 2017-09-01 NOTE — Telephone Encounter (Signed)
-----   Message from Vivi BarrackMatthew R Wagoner, DPM sent at 08/28/2017  7:36 AM EDT ----- No evidence of DVT. Enlarged lymph nodes in the groin. Should follow-up with PCP for his. Val- can you please call him to let him know? Thanks.

## 2017-09-01 NOTE — Telephone Encounter (Signed)
Will fax copy of results to Dr. Tanya NonesPickard once Dr. Ardelle AntonWagoner prints.

## 2017-09-01 NOTE — Telephone Encounter (Signed)
printed

## 2017-09-16 ENCOUNTER — Encounter: Payer: Self-pay | Admitting: Family Medicine

## 2017-09-23 ENCOUNTER — Encounter: Payer: Self-pay | Admitting: Family Medicine

## 2017-09-24 ENCOUNTER — Other Ambulatory Visit: Payer: Self-pay | Admitting: Family Medicine

## 2017-09-30 ENCOUNTER — Other Ambulatory Visit: Payer: Self-pay | Admitting: *Deleted

## 2017-09-30 MED ORDER — LANCETS MISC: 1 refills | Status: AC

## 2017-10-17 ENCOUNTER — Other Ambulatory Visit: Payer: Self-pay | Admitting: Family Medicine

## 2017-10-24 ENCOUNTER — Other Ambulatory Visit: Payer: Self-pay | Admitting: Family Medicine

## 2017-10-28 ENCOUNTER — Other Ambulatory Visit: Payer: Self-pay | Admitting: Family Medicine

## 2017-10-28 MED ORDER — LINACLOTIDE 145 MCG PO CAPS: 145.0000 ug | ORAL_CAPSULE | Freq: Every day | ORAL | 3 refills | Status: AC

## 2017-11-27 ENCOUNTER — Ambulatory Visit: Payer: Medicaid Other | Admitting: Podiatry

## 2018-02-17 ENCOUNTER — Other Ambulatory Visit: Payer: Self-pay | Admitting: Family Medicine

## 2018-11-05 ENCOUNTER — Other Ambulatory Visit: Payer: Self-pay | Admitting: Family Medicine

## 2019-01-07 ENCOUNTER — Telehealth: Payer: Self-pay

## 2019-01-07 NOTE — Telephone Encounter (Signed)
[facility] called to get the pt transferred from [facility] to their system but he was not followed by our device clinic.

## 2023-12-01 DEATH — deceased
